# Patient Record
Sex: Male | Born: 1940 | Race: White | Hispanic: No | Marital: Married | Smoking: Former smoker
Health system: Southern US, Community
[De-identification: ages and names within clinical notes are randomized; demographics above are authoritative.]

## PROBLEM LIST (undated history)

## (undated) DIAGNOSIS — R001 Bradycardia, unspecified: Secondary | ICD-10-CM

## (undated) DIAGNOSIS — M549 Dorsalgia, unspecified: Secondary | ICD-10-CM

## (undated) DIAGNOSIS — M8580 Other specified disorders of bone density and structure, unspecified site: Secondary | ICD-10-CM

## (undated) DIAGNOSIS — R030 Elevated blood-pressure reading, without diagnosis of hypertension: Secondary | ICD-10-CM

## (undated) DIAGNOSIS — M1990 Unspecified osteoarthritis, unspecified site: Secondary | ICD-10-CM

## (undated) DIAGNOSIS — E785 Hyperlipidemia, unspecified: Secondary | ICD-10-CM

## (undated) DIAGNOSIS — G8929 Other chronic pain: Secondary | ICD-10-CM

## (undated) DIAGNOSIS — E119 Type 2 diabetes mellitus without complications: Secondary | ICD-10-CM

## (undated) DIAGNOSIS — I10 Essential (primary) hypertension: Secondary | ICD-10-CM

## (undated) DIAGNOSIS — I4891 Unspecified atrial fibrillation: Secondary | ICD-10-CM

## (undated) DIAGNOSIS — I2510 Atherosclerotic heart disease of native coronary artery without angina pectoris: Secondary | ICD-10-CM

## (undated) HISTORY — PX: CHOLECYSTECTOMY: SHX55

## (undated) HISTORY — DX: Hyperlipidemia, unspecified: E78.5

## (undated) HISTORY — DX: Other chronic pain: G89.29

## (undated) HISTORY — DX: Elevated blood-pressure reading, without diagnosis of hypertension: R03.0

## (undated) HISTORY — DX: Other specified disorders of bone density and structure, unspecified site: M85.80

## (undated) HISTORY — DX: Dorsalgia, unspecified: M54.9

## (undated) HISTORY — DX: Atherosclerotic heart disease of native coronary artery without angina pectoris: I25.10

## (undated) HISTORY — DX: Type 2 diabetes mellitus without complications: E11.9

## (undated) HISTORY — DX: Bradycardia, unspecified: R00.1

## (undated) HISTORY — DX: Unspecified atrial fibrillation: I48.91

---
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
## 1999-01-18 ENCOUNTER — Ambulatory Visit (HOSPITAL_COMMUNITY): Admission: RE | Admit: 1999-01-18 | Discharge: 1999-01-19 | Payer: Self-pay | Admitting: Cardiology

## 2001-05-27 ENCOUNTER — Emergency Department (HOSPITAL_COMMUNITY): Admission: EM | Admit: 2001-05-27 | Discharge: 2001-05-27 | Payer: Self-pay | Admitting: Emergency Medicine

## 2001-05-27 ENCOUNTER — Encounter: Payer: Self-pay | Admitting: Emergency Medicine

## 2003-03-11 ENCOUNTER — Emergency Department (HOSPITAL_COMMUNITY): Admission: EM | Admit: 2003-03-11 | Discharge: 2003-03-11 | Payer: Self-pay | Admitting: *Deleted

## 2003-03-11 ENCOUNTER — Encounter: Payer: Self-pay | Admitting: *Deleted

## 2004-07-27 ENCOUNTER — Ambulatory Visit (HOSPITAL_COMMUNITY): Admission: RE | Admit: 2004-07-27 | Discharge: 2004-07-27 | Payer: Self-pay | Admitting: Family Medicine

## 2004-09-25 DIAGNOSIS — I4891 Unspecified atrial fibrillation: Secondary | ICD-10-CM

## 2004-09-25 HISTORY — DX: Unspecified atrial fibrillation: I48.91

## 2004-12-10 ENCOUNTER — Ambulatory Visit: Payer: Self-pay | Admitting: Cardiology

## 2004-12-10 ENCOUNTER — Inpatient Hospital Stay (HOSPITAL_COMMUNITY): Admission: EM | Admit: 2004-12-10 | Discharge: 2004-12-13 | Payer: Self-pay | Admitting: Emergency Medicine

## 2004-12-12 ENCOUNTER — Ambulatory Visit: Payer: Self-pay | Admitting: Cardiology

## 2004-12-13 ENCOUNTER — Ambulatory Visit: Payer: Self-pay | Admitting: Cardiology

## 2005-01-13 ENCOUNTER — Ambulatory Visit: Payer: Self-pay | Admitting: Cardiovascular Disease

## 2005-05-16 ENCOUNTER — Ambulatory Visit: Payer: Self-pay | Admitting: Cardiology

## 2005-06-02 ENCOUNTER — Emergency Department (HOSPITAL_COMMUNITY): Admission: EM | Admit: 2005-06-02 | Discharge: 2005-06-02 | Payer: Self-pay | Admitting: *Deleted

## 2005-06-26 ENCOUNTER — Ambulatory Visit: Payer: Self-pay | Admitting: Cardiology

## 2005-07-05 ENCOUNTER — Ambulatory Visit: Payer: Self-pay | Admitting: Cardiology

## 2005-07-05 ENCOUNTER — Encounter (HOSPITAL_COMMUNITY): Admission: RE | Admit: 2005-07-05 | Discharge: 2005-08-04 | Payer: Self-pay | Admitting: Cardiology

## 2005-07-13 ENCOUNTER — Ambulatory Visit: Payer: Self-pay | Admitting: Cardiology

## 2005-12-27 ENCOUNTER — Ambulatory Visit (HOSPITAL_COMMUNITY): Admission: RE | Admit: 2005-12-27 | Discharge: 2005-12-27 | Payer: Self-pay | Admitting: Family Medicine

## 2007-02-15 ENCOUNTER — Ambulatory Visit (HOSPITAL_COMMUNITY): Admission: RE | Admit: 2007-02-15 | Discharge: 2007-02-15 | Payer: Self-pay | Admitting: Family Medicine

## 2007-05-05 ENCOUNTER — Emergency Department (HOSPITAL_COMMUNITY): Admission: EM | Admit: 2007-05-05 | Discharge: 2007-05-05 | Payer: Self-pay | Admitting: Emergency Medicine

## 2007-05-13 IMAGING — CR DG CHEST 1V PORT
1 series · 1 of 1 positions shown · non-contrast
Comparison: none

CLINICAL DATA: Chest pain.   Cardiac arrhythmia.
 PORTABLE CHEST - 1 VIEW - 06/02/05 AT 5159 HOURS:

[view not recorded]
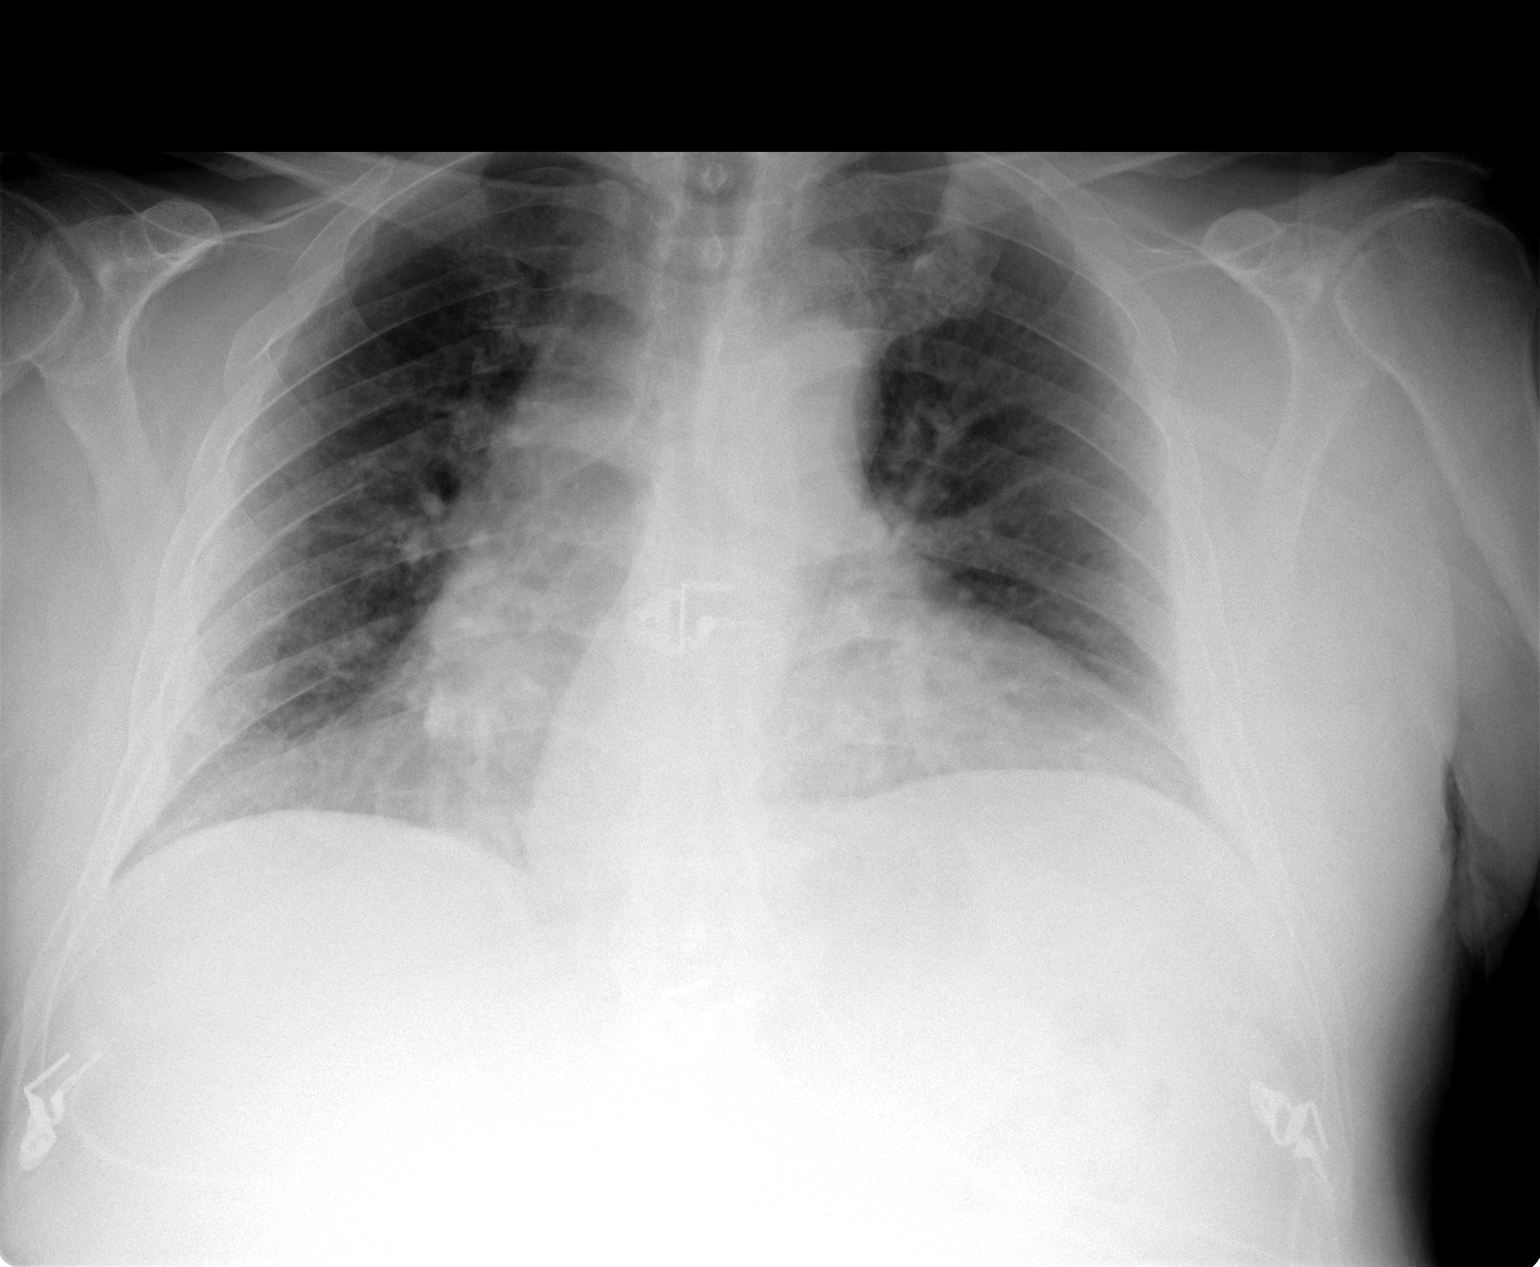

[1 of 1 positions shown; findings below may reference images not displayed]

FINDINGS: Compared to a chest x-ray of 12/10/04, there is little change in cardiomegaly.  The patient is somewhat rotated and has not taken a deep inspiration.  No acute process is seen.
IMPRESSION: Cardiomegaly.  Suboptimal inspiration.  No active process.

## 2008-04-03 ENCOUNTER — Ambulatory Visit (HOSPITAL_COMMUNITY): Admission: RE | Admit: 2008-04-03 | Discharge: 2008-04-03 | Payer: Self-pay | Admitting: Family Medicine

## 2008-09-25 HISTORY — PX: CIRCUMCISION: SUR203

## 2008-12-19 ENCOUNTER — Emergency Department (HOSPITAL_COMMUNITY): Admission: EM | Admit: 2008-12-19 | Discharge: 2008-12-19 | Payer: Self-pay | Admitting: Emergency Medicine

## 2008-12-29 ENCOUNTER — Ambulatory Visit (HOSPITAL_COMMUNITY): Admission: RE | Admit: 2008-12-29 | Discharge: 2008-12-29 | Payer: Self-pay | Admitting: Urology

## 2008-12-29 ENCOUNTER — Encounter (INDEPENDENT_AMBULATORY_CARE_PROVIDER_SITE_OTHER): Payer: Self-pay | Admitting: Urology

## 2010-02-09 ENCOUNTER — Ambulatory Visit (HOSPITAL_COMMUNITY): Admission: RE | Admit: 2010-02-09 | Discharge: 2010-02-09 | Payer: Self-pay | Admitting: Family Medicine

## 2010-04-12 ENCOUNTER — Emergency Department (HOSPITAL_COMMUNITY): Admission: EM | Admit: 2010-04-12 | Discharge: 2010-04-12 | Payer: Self-pay | Admitting: Emergency Medicine

## 2010-09-25 HISTORY — PX: CATARACT EXTRACTION: SUR2

## 2010-12-10 LAB — GLUCOSE, CAPILLARY: Glucose-Capillary: 250 mg/dL — ABNORMAL HIGH (ref 70–99)

## 2010-12-12 ENCOUNTER — Other Ambulatory Visit: Payer: Self-pay | Admitting: Ophthalmology

## 2010-12-12 ENCOUNTER — Encounter (HOSPITAL_COMMUNITY): Payer: Medicare Other | Attending: Ophthalmology

## 2010-12-12 DIAGNOSIS — Z01810 Encounter for preprocedural cardiovascular examination: Secondary | ICD-10-CM | POA: Insufficient documentation

## 2010-12-12 DIAGNOSIS — Z01812 Encounter for preprocedural laboratory examination: Secondary | ICD-10-CM | POA: Insufficient documentation

## 2010-12-12 LAB — HEMOGLOBIN AND HEMATOCRIT, BLOOD: HCT: 46 % (ref 39.0–52.0)

## 2010-12-12 LAB — BASIC METABOLIC PANEL
BUN: 11 mg/dL (ref 6–23)
Creatinine, Ser: 0.82 mg/dL (ref 0.4–1.5)
GFR calc Af Amer: >60 mL/min (ref >60)
GFR calc non Af Amer: >60 mL/min (ref >60)
Glucose, Bld: 194 mg/dL — ABNORMAL HIGH (ref 70–99)
Sodium: 138 mEq/L (ref 135–145)

## 2010-12-26 ENCOUNTER — Encounter (HOSPITAL_COMMUNITY): Payer: Medicare Other | Attending: Ophthalmology

## 2010-12-29 ENCOUNTER — Ambulatory Visit (HOSPITAL_COMMUNITY)
Admission: RE | Admit: 2010-12-29 | Discharge: 2010-12-29 | Disposition: A | Discharge status: Home / Self Care | Payer: Medicare Other | Source: Ambulatory Visit | Attending: Ophthalmology | Admitting: Ophthalmology

## 2010-12-29 DIAGNOSIS — H2510 Age-related nuclear cataract, unspecified eye: Secondary | ICD-10-CM | POA: Insufficient documentation

## 2010-12-29 DIAGNOSIS — I10 Essential (primary) hypertension: Secondary | ICD-10-CM | POA: Insufficient documentation

## 2010-12-29 DIAGNOSIS — Z7982 Long term (current) use of aspirin: Secondary | ICD-10-CM | POA: Insufficient documentation

## 2010-12-29 DIAGNOSIS — E119 Type 2 diabetes mellitus without complications: Secondary | ICD-10-CM | POA: Insufficient documentation

## 2010-12-29 DIAGNOSIS — Z79899 Other long term (current) drug therapy: Secondary | ICD-10-CM | POA: Insufficient documentation

## 2011-01-04 LAB — BASIC METABOLIC PANEL
BUN: 16 mg/dL (ref 6–23)
CO2: 25 mEq/L (ref 19–32)
Creatinine, Ser: 0.8 mg/dL (ref 0.4–1.5)
GFR calc non Af Amer: >60 mL/min (ref >60)
Glucose, Bld: 108 mg/dL — ABNORMAL HIGH (ref 70–99)

## 2011-01-04 LAB — GLUCOSE, CAPILLARY: Glucose-Capillary: 134 mg/dL — ABNORMAL HIGH (ref 70–99)

## 2011-01-04 LAB — HEMOGLOBIN AND HEMATOCRIT, BLOOD: Hemoglobin: 15.3 g/dL (ref 13.0–17.0)

## 2011-01-05 LAB — URINALYSIS, ROUTINE W REFLEX MICROSCOPIC
Bilirubin Urine: NEGATIVE
Ketones, ur: NEGATIVE mg/dL
Nitrite: POSITIVE — AB
Protein, ur: NEGATIVE mg/dL
Urobilinogen, UA: 0.2 mg/dL (ref 0.0–1.0)

## 2011-01-05 LAB — URINE MICROSCOPIC-ADD ON

## 2011-01-16 ENCOUNTER — Encounter (HOSPITAL_COMMUNITY): Payer: Medicare Other | Attending: Ophthalmology

## 2011-01-23 ENCOUNTER — Ambulatory Visit (HOSPITAL_COMMUNITY)
Admission: RE | Admit: 2011-01-23 | Discharge: 2011-01-23 | Disposition: A | Discharge status: Home / Self Care | Payer: Medicare Other | Source: Ambulatory Visit | Attending: Ophthalmology | Admitting: Ophthalmology

## 2011-01-23 DIAGNOSIS — H2510 Age-related nuclear cataract, unspecified eye: Secondary | ICD-10-CM | POA: Insufficient documentation

## 2011-01-23 DIAGNOSIS — Z7982 Long term (current) use of aspirin: Secondary | ICD-10-CM | POA: Insufficient documentation

## 2011-01-23 DIAGNOSIS — I10 Essential (primary) hypertension: Secondary | ICD-10-CM | POA: Insufficient documentation

## 2011-01-23 DIAGNOSIS — Z79899 Other long term (current) drug therapy: Secondary | ICD-10-CM | POA: Insufficient documentation

## 2011-01-23 DIAGNOSIS — E119 Type 2 diabetes mellitus without complications: Secondary | ICD-10-CM | POA: Insufficient documentation

## 2011-01-23 LAB — GLUCOSE, CAPILLARY: Glucose-Capillary: 173 mg/dL — ABNORMAL HIGH (ref 70–99)

## 2011-01-27 ENCOUNTER — Ambulatory Visit (HOSPITAL_COMMUNITY)
Admission: RE | Admit: 2011-01-27 | Discharge: 2011-01-27 | Disposition: A | Discharge status: Home / Self Care | Payer: Medicare Other | Source: Ambulatory Visit | Attending: Family Medicine | Admitting: Family Medicine

## 2011-01-27 ENCOUNTER — Other Ambulatory Visit (HOSPITAL_COMMUNITY): Payer: Self-pay | Admitting: Family Medicine

## 2011-01-27 DIAGNOSIS — M549 Dorsalgia, unspecified: Secondary | ICD-10-CM

## 2011-01-27 DIAGNOSIS — IMO0002 Reserved for concepts with insufficient information to code with codable children: Secondary | ICD-10-CM | POA: Insufficient documentation

## 2011-01-27 DIAGNOSIS — I4891 Unspecified atrial fibrillation: Secondary | ICD-10-CM | POA: Insufficient documentation

## 2011-01-27 DIAGNOSIS — M546 Pain in thoracic spine: Secondary | ICD-10-CM | POA: Insufficient documentation

## 2011-01-27 DIAGNOSIS — R079 Chest pain, unspecified: Secondary | ICD-10-CM | POA: Insufficient documentation

## 2011-02-07 NOTE — Op Note (Signed)
NAME:  Shawn Lopez, Shawn Lopez NO.:  [id number]   MEDICAL RECORD NO.:  [id number]          PATIENT TYPE:  AMB   LOCATION:  DAY                           FACILITY:  [facility]   PHYSICIAN:  Ky Barban, M.D.DATE OF BIRTH:  01-Oct-1940   DATE OF PROCEDURE:  12/29/2008  DATE OF DISCHARGE:                               OPERATIVE REPORT   PREOPERATIVE DIAGNOSES:  Severe phimosis and meatal stenosis.   PROCEDURES:  Circumcision, complex meatotomy, cystoscopy, and dilation.   ANESTHESIA:  Spinal.   DESCRIPTION OF PROCEDURE:  The patient under spinal anesthesia in supine  position, usual prep and drape.  On examination I can see his priapus  is completely circumferentially adherent to his glans penis covering it  completely.  It was attached to the meatus.  First I marked the corona  circumferentially then I marked the foreskin in the midline from the  meatus up to the corona.  A sharp incision was made with the knife down  to the glans penis, which was identified and the priapus is completely  adherent to the glans penis with blunt and sharp dissection it was  lifted off the glans penis completely circumferentially.  Then the  redundant priapus as the previously marked site circumferentially was  excised leaving some areas, I left a mucosa other area was bare.  With  the bleeders I coagulated skin and the mucosa and some of the area was  closed with interrupted sutures of 3-0 chromic and some areas skin was  directly sutured to the back of the corona.  The meatotomy was done and  I had to dilate the urethra to get the flexible cystoscope into the  urethra.  Anterior urethra looks normal.  Prostatic urethra is open.  Bladder is 2+ trabeculated.  No tumor stone or foreign body.  Cystoscope  was removed.  Foley catheter was left in for drainage.  Sterile gauze  dressing applied circumferentially on the circumcision incision and then  the glans penis was covered with some  Neosporin ointment.  The patient  left the operating room in satisfactory condition.      Ky Barban, M.D.  Electronically Signed     MIJ/MEDQ  D:  12/29/2008  T:  12/30/2008  Job:  [id number]

## 2011-02-07 NOTE — H&P (Signed)
NAME:  Shawn Lopez, Shawn Lopez NO.:  [id number]   MEDICAL RECORD NO.:  [id number]          PATIENT TYPE:  AMB   LOCATION:  DAY                           FACILITY:  [facility]   PHYSICIAN:  Ky Barban, M.D.DATE OF BIRTH:  August 09, 1941   DATE OF ADMISSION:  DATE OF DISCHARGE:  LH                              HISTORY & PHYSICAL   CHIEF COMPLAINT:  Severe phimosis.   HISTORY:  A 70 year old gentleman came to the emergency room last week  with difficulty to void and he was found to have meatal stenosis and  severe phimosis.  His glans penis was completely covered with his  prepuce and his meatus was scarred.  I was able to dilate his meatus a  little bit and I recommended that he needed to have meatotomy and  circumcision and cystoscopy for which he is coming as an outpatient and  I have described the procedure and limitations and complications.  I  have told him that meatus scarring can keep coming back and we will have  to keep dilating it.   PAST MEDICAL HISTORY:  He does have diabetes, noninsulin dependent.  No  history of hypertension.   MEDICATIONS:  None.   REVIEW OF SYSTEMS:  Unremarkable.   PAST SURGICAL HISTORY:  He had appendectomy done in 1960.   PERSONAL HISTORY:  He does not smoke or drink.   PHYSICAL EXAMINATION:  Blood pressure 142/72.  Fully conscious, alert and oriented, not in acute distress.  CENTRAL NERVOUS SYSTEM:  Negative.  HEAD AND NECK:  Negative.  CHEST:  Symmetrical.  HEART:  Regular sinus rhythm.  No murmur.  ABDOMEN:  Soft, flat.  Liver, spleen, and kidneys are not palpable.  No CVA tenderness.  EXTERNAL GENITALIA:  On looking at his penis, the glans penis completely  covered and I cannot see the glans penis.  The meatus is scarred and  narrow.  Testicles are normal.  RECTAL:  Sphincter tone is normal.  No rectal mass.  Prostate one and a  half plus, smooth and firm.   IMPRESSION:  Meatal stenosis and phimosis.   PLAN:   Circumcision, meatotomy, and cystoscopy under anesthesia as an  outpatient.      Ky Barban, M.D.  Electronically Signed     MIJ/MEDQ  D:  12/28/2008  T:  12/28/2008  Job:  [id number]

## 2011-02-10 NOTE — H&P (Signed)
NAME:  LEOPOLD, SMYERS NO.:  [id number]   MEDICAL RECORD NO.:  [id number]          PATIENT TYPE:  INP   LOCATION:  IC03                          FACILITY:  [facility]   PHYSICIAN:  Kingsley Callander. Ouida Sills, MD       DATE OF BIRTH:  [birth year]   DATE OF ADMISSION:  12/10/2004  DATE OF DISCHARGE:  LH                                HISTORY & PHYSICAL   CHIEF COMPLAINT:  Dizziness.   HISTORY OF PRESENT ILLNESS:  This patient is a 70 year old white male who  presented to the emergency room after feeling lightheaded and dizzy.  He  felt as though he would pass out.  He did not lose consciousness.  He  experienced tightness in his chest for about 1-1/2 hours.  He had some  associated shortness of breath along with diaphoresis.  He did not vomit.  He has a history of a normal cardiac catheterization he states approximately  5 years ago by the [facility].  He describes having a  similar episode with rapid heart beating while on an elk hunt in [place]  last Fall.  He required helicopter transport to the hospital in [place].  He  had pneumonia at that time and an associated bout of what sounds like atrial  fibrillation.  On presentation to the emergency room today he had rapid  atrial fibrillation and converted after treatment with IV diltiazem.  He  subsequently had pauses on diltiazem and his rate was reduced to 5 mg per  hour.  He does not smoke.  He does not have diabetes.  He has been treated  for hypertension in the past but has not required therapy recently.  He has  a family history of coronary heart disease in that two brothers and his  father had MIs.   PAST MEDICAL HISTORY:  1.  Appendectomy.  2.  Two back fractures.  3.  Pneumonia.  4.  Hypertension.   MEDICATIONS:  None.   ALLERGIES:  None.   SOCIAL HISTORY:  He does not smoke, drink or use drugs.   FAMILY HISTORY:  As above.   REVIEW OF SYSTEMS:  No thyroid disease, cough, abdominal pain, change in  bowel habits, rectal bleeding, difficulty voiding, hematuria.   PHYSICAL EXAMINATION:  VITAL SIGNS:  Temperature 97.4, blood pressure  141/77, pulse initially in the 170 range, respirations 20.  GENERAL:  Alert, fully oriented.  He is overweight.  HEENT:  Eyes/oropharynx appear normal.  No JVD, thyromegaly or bruit.  LUNGS:  Clear.  HEART:  Regular with no murmurs.  ABDOMEN:  Nontender.  No hepatosplenomegaly.  EXTREMITIES:  Normal pedal pulses.  No cyanosis, clubbing or edema.  NEUROLOGIC:  Grossly intact.   LABORATORY DATA:  His initial rhythm strip reveals atrial fibrillation with  a rapid ventricular response.  His 12-lead EKG reveals normal sinus rhythm  at 92 beats per minute following conversion.  There are no ischemic changes.  White count 9.3, hemoglobin 16.4, platelets 288, sodium 136, potassium 3.5,  bicarb 24, glucose 122, BUN 13, creatinine 1, calcium 9.2, albumin 3.8, SGOT  25, troponin I  less than 0.05, CK-MB 2.  His chest x-ray reveals mild  cardiomegaly but no active disease.   IMPRESSION:  1.  Recurrent onset atrial fibrillation with a rapid ventricular response.      He will be hospitalized in the CCU.  Serial cardiac enzymes and      electrocardiograms will be obtained.  A TSH and echocardiogram will be      checked.  We will continue intravenous diltiazem however, his rate will      need to be watched closely in light of his pauses earlier in the      emergency room.  His oxygen saturation is normal at 97%.  He is free of      any chest tightness now.  2.  History of hypertension.      ROF/MEDQ  D:  12/10/2004  T:  12/10/2004  Job:  [id number]   cc:   Mila Homer. Sudie Bailey, M.D.  [address] [place], [place] [address]  Fax: [phone]

## 2011-02-10 NOTE — Group Therapy Note (Signed)
NAME:  Shawn Lopez, AZEEZ NO.:  [id number]   MEDICAL RECORD NO.:  [id number]          PATIENT TYPE:  INP   LOCATION:  A218                          FACILITY:  [facility]   PHYSICIAN:  Mila Homer. Sudie Bailey, M.D.DATE OF BIRTH:  10/22/1940   DATE OF PROCEDURE:  12/12/2004  DATE OF DISCHARGE:                                   PROGRESS NOTE   SUBJECTIVE:  Patient had a bout of atrial fibrillation, rapid ventricular  response two days ago.  He was brought to the hospital, treated with  diltiazem and his rate eventually reverted to normal.  He now feels totally  back to normal, has no symptoms.   OBJECTIVE:  Temperature 97.2, pulse 53, respirations 18, blood pressure  119/64.  He is sitting up in a chair. He is well-developed, well-nourished,  no acute distress.  He is oriented and alert.  His color is good.  The heart  has a regular rate and rhythm about 65, no obvious murmurs.  The lungs  appear clear throughout.  Abdomen is soft without hepatosplenomegaly or  mass.  He has mild obesity.  No edema of the ankles.   ASSESSMENT:  Paroxysmal atrial fibrillation.   PLAN:  Continue current medications of aspirin 325 mg daily, metoprolol 25  mg b.i.d., Kay-Ciel 20 mEq b.i.d.  Discuss with Dr. Dietrich Pates, Cardiology.  Once planned how to treat him outpatient and prevent further paroxysmal  atrial fibrillation will discharge him.      SDK/MEDQ  D:  12/12/2004  T:  12/12/2004  Job:  [id number]

## 2011-02-10 NOTE — Procedures (Signed)
NAME:  Shawn Lopez, Shawn Lopez NO.:  [id number]   MEDICAL RECORD NO.:  [id number]          PATIENT TYPE:  REC   LOCATION:  ED                            FACILITY:  [facility]   PHYSICIAN:  Willa Rough, M.D.     DATE OF BIRTH:  1941-05-09   DATE OF PROCEDURE:  DATE OF DISCHARGE:  06/02/2005                                    STRESS TEST   HISTORY OF PRESENT ILLNESS:  The patient has no known coronary disease and  has had chest pain.  This study is done for further evaluation.   STRESS PROTOCOL:  The patient walked on the treadmill.  He walked for 10  minutes with a peak heart rate of 133 representing 85% of predicted maximum  heart rate.  There was some slight chest discomfort and some shortness of  breath.  There was no significant EKG change.  There was no significant  arrhythmia.   NUCLEAR IMAGES:  The raw data reveals that there is some motion in the  stress image.  The motion correction protocol was used.  The tomograph  images reveal no evidence of significant interference from other nuclear  activity.  There are no areas of decreased counts.  There is no sign of scar  or ischemia.  Wall motion is normal, and the ejection fraction is 61%.   IMPRESSION:  Walking on the treadmill, there is no diagnostic EKG change.  There was slight chest tightness.  The stress images are normal.  There is  no sign of scar or ischemia.  There is normal wall motion, and a normal  ejection fraction.           ______________________________  Willa Rough, M.D.     JK/MEDQ  D:  07/05/2005  T:  07/06/2005  Job:  [id number]   cc:   Vayas Bing, M.D. [facility]  [address]  [address]  [place]  [place] [address]

## 2011-02-10 NOTE — Consult Note (Signed)
NAME:  Shawn Lopez, Shawn Lopez NO.:  [id number]   MEDICAL RECORD NO.:  [id number]          PATIENT TYPE:  INP   LOCATION:  A218                          FACILITY:  [facility]   PHYSICIAN:  [place] Bing, M.D.  DATE OF BIRTH:  [birth year]   DATE OF CONSULTATION:  12/12/2004  DATE OF DISCHARGE:                                   CONSULTATION   REFERRING PHYSICIAN:  Dr. Metta Clines.   HISTORY OF PRESENT ILLNESS:  A 70 year old gentleman with generally  excellent health, admitted to [facility] with recurrent paroxysmal  atrial fibrillation. [name] first developed palpitations and  lightheadedness on a hunting trip in [place] some months ago. He required  helicopter evacuation to a local hospital, but was kept only overnight and  apparently converted to sinus rhythm spontaneously. He underwent a stress  test that was reportedly negative. Subsequent to that, he was well until the  past week or so when he experienced a brief episode of recurrent symptoms.  On the day of admission, he developed a prolonged episode associated with  lightheadedness, near syncope, chest tightness, dyspnea and diaphoresis,  lasting for approximately 90 minutes. He was found to be in atrial  fibrillation with a rapid ventricular response on arrival to the emergency  department, no 12-lead EKG documents that rhythm. He was treated with  intravenous Diltiazem and subsequently converted to sinus rhythm with  resolution of symptoms. He experienced an episode of lightheadedness and  weakness earlier today, but no arrhythmia was apparently identified on  telemetry.   PAST MEDICAL HISTORY:  Notable for borderline hypertension, which has not  required pharmacologic therapy. He has a history of back injury, apparently  with vertebral fracture. He was treated for pneumonia in October of last  year. Prior surgeries have included appendectomy and cholecystectomy. He was  admitted to [facility] 5  years ago with chest discomfort. Coronary  angiography was reportedly normal at that time.   The patient has no known allergies. He is currently taking no medications  routinely.   SOCIAL HISTORY:  Lives in [place] with his wife; has 5 healthy children.  Works as a Armed forces technical officer. Very active, including  multiple outdoor activities.   FAMILY HISTORY:  Mother died at age 51 due to Alzheimer's. Father died at  age 19 with a remote history of myocardial infarction. His fatal event was  neoplastic disease. He has 2 brothers, both of whom died of coronary artery  disease in their 55s.   REVIEW OF SYSTEMS:  Notable for intermittent headaches, occasionally with  dizziness. He has had intermittent dyspnea on exertion. He occasionally  experiences weakness in his right arm. He has myalgias of his back. He  reports occasional nausea. All other systems are reviewed and are negative.   On exam, pleasant, tanned, well-appearing gentleman. The temperature is  97.2, heart rate 55 and regular, respirations 18, blood pressure 120/65,  weight 213.  HEENT:  Anicteric sclerae; normal lens and conjunctivae.  NECK:  No jugular vein distention; normal carotid upstrokes without bruits.  HEMATOPOIETIC:  No lymphadenopathy.  ENDOCRINE:  No thyromegaly.  SKIN:  No significant lesions.  LUNGS:  Clear.  CARDIAC:  Normal first and second heart sounds; fourth heart sound present.  ABDOMEN:  Soft and nontender; no organomegaly.  EXTREMITIES:  Normal distal pulses; no edema.  NEUROMUSCULAR:  Normal strength and tone; normal cranial nerves.  MUSCULOSKELETAL:  No joint deformity.   CHEST X-RAY:  Slight cardiomegaly; otherwise unremarkable.  EKG:  Sinus bradycardia; otherwise normal.   Other laboratories notable for normal CBC, normal chemistry profile and  normal cardiac markers and normal TSH.   IMPRESSION:  [name] presents with recurrent paroxysmal atrial  fibrillation,  apparently without any significant structural heart disease.  His history of hypertension is marginal. Would consider this paroxysmal  etiopathic atrial fibrillation. He has no real risk factors for  thromboembolism. Accordingly, carotid anticoagulation will not be required.  Beta blockers inappropriate first drug. He is only tolerating a low dose of  this due to sinus bradycardia and probably has a component of sick sinus  syndrome. Continue treatment with aspirin is also appropriate. Digoxin can  be added if he has recurrent atrial fibrillation with an excessive  ventricular response.  Potential future and alternative treatments were discussed with the patient  including anti arrhythmic therapy and radiofrequency ablation. For now, no  further evaluation or treatment is required other than his echocardiogram,  which is pending. We will arrange event recording for the next month since  he continues to have symptoms apparently in the absence of arrhythmia and  will reevaluate this nice gentleman in the office following that testing.      RR/MEDQ  D:  12/13/2004  T:  12/13/2004  Job:  [id number]

## 2011-02-10 NOTE — Procedures (Signed)
NAME:  Shawn Lopez, Shawn Lopez NO.:  [id number]   MEDICAL RECORD NO.:  [id number]          PATIENT TYPE:  OUT   LOCATION:  RAD                           FACILITY:  [facility]   PHYSICIAN:  Edward L. Juanetta Gosling, M.D.DATE OF BIRTH:  May 01, 1941   DATE OF PROCEDURE:  DATE OF DISCHARGE:  07/27/2004                              PULMONARY FUNCTION TEST   1.  Spirometry is normal with the exception of a reduction of [id number] which      may indicate small airway airflow obstruction.  2.  Lung volumes are normal.  3.  Diffusion capacity of carbon monoxide is normal.     [place]   ELH/MEDQ  D:  08/01/2004  T:  08/01/2004  Job:  [id number]   cc:   Mila Homer. Sudie Bailey, M.D.  [address] [place], [place] [address]  Fax: [phone]

## 2011-02-10 NOTE — Procedures (Signed)
NAME:  Shawn Lopez, Shawn Lopez NO.:  [id number]   MEDICAL RECORD NO.:  [id number]          PATIENT TYPE:  INP   LOCATION:  A218                          FACILITY:  [facility]   PHYSICIAN:  Nome Bing, M.D.  DATE OF BIRTH:  Apr 25, 1941   DATE OF PROCEDURE:  12/12/2004  DATE OF DISCHARGE:                                  ECHOCARDIOGRAM   CLINICAL DATA:  70 year old gentleman with paroxysmal atrial fibrillation.   M-MODE:  Aorta  3.5  Left atrium  4.5  Septum  1.6  Posterior wall  1.3  LV diastole  3.2  LV systole  1.9   1.  Technically adequate echocardiographic study.  2.  Mild left atrial enlargement; normal right atrium and right ventricle.  3.  Mild aortic valvular sclerosis.  4.  Normal mitral valve; mild annular calcification.  5.  Normal tricuspid valve.  6.  Normal left ventricular size; mild hypertrophy with disproportionate      thickening of the septum; normal regional and global LV systolic      function.      RR/MEDQ  D:  12/12/2004  T:  12/13/2004  Job:  [id number]

## 2011-02-10 NOTE — Discharge Summary (Signed)
NAME:  Shawn Lopez, Shawn Lopez NO.:  [id number]   MEDICAL RECORD NO.:  [id number]          PATIENT TYPE:  INP   LOCATION:  A218                          FACILITY:  [facility]   PHYSICIAN:  Mila Homer. Sudie Bailey, M.D.DATE OF BIRTH:  06/20/1941   DATE OF ADMISSION:  12/10/2004  DATE OF DISCHARGE:  LH                                 DISCHARGE SUMMARY   A 70 year old was admitted to the hospital with atrial fibrillation and  rapid ventricular response.  He had a benign four-day hospitalization  extending from March 18 to December 13, 2004.  Once his rate was controlled and  converted, his vital signs were stable.   His admission CBC, CNP were normal except for glucose 122 and his cardiac  markers were negative.  Although by his second day, his troponin was up to  0.06 (CK was only 116, MB 1.8, relative index 1.6), however.  His PSA was  2.708.   Chest x-ray showed mild cardiomegaly.  No evidence for active chest  disease.   He had a cardiac echo the day before discharge, which showed mild  hypertrophy of the left ventricle with disproportionate thickening of the  septum with normal reasonable global LV systolic function.   His EKG on admission showed atrial fibrillation and rapid ventricular  response.  He was then treated with a bolus of diltiazem and put on a  diltiazem drip.  On this, he eventually converted, but had several  significant pauses lasting about 3 seconds a piece.  Eventually converted,  was a normal sinus rhythm.  And then on metoprolol became somewhat  bradycardic such that repeat EKG, his second day, March 19, showed a rate of  56.   Treatment in the hospital consisted of aspirin 325 mg daily, Lovenox mg/kg  subcutaneous q.12h., O2 at 2 liters by nasal prongs, and p.r.n. Tylenol and  Ambien.  He was given KCl 20 mEq t.i.d. for three doses.   His second day Lovenox was discontinued and he was moved from the intensive  care unit to the floor in a monitored bed.   He was started on Lopressor 25  mg b.i.d. and a consult was put in with [facility].   He was seen by Dr. Donnamarie Rossetti and felt a sick sinus syndrome.  Metoprolol  was continued and arrangements were made for him to have an event recorder  outpatient.   He was continued on metoprolol 25 mg b.i.d. (60 with 11 refills).  He is to  use 81 mg aspirin daily.  Followup in our office within the week.   FINAL DISCHARGE DIAGNOSES:  1.  Atrial fibrillation with rapid ventricular response.  2.  Presumptive sick sinus syndrome.  3.  Hyperglycemia.   Time spent with the patient today reviewing his paper medical record,  electronic medical record, writing prescriptions, talking to the patient,  examining the patient, discussing with cardiology, dictating a note was 35  minutes.      SDK/MEDQ  D:  12/13/2004  T:  12/13/2004  Job:  [id number]

## 2011-02-15 ENCOUNTER — Other Ambulatory Visit (HOSPITAL_COMMUNITY): Payer: Self-pay | Admitting: Family Medicine

## 2011-02-15 DIAGNOSIS — Z139 Encounter for screening, unspecified: Secondary | ICD-10-CM

## 2011-02-24 ENCOUNTER — Ambulatory Visit (HOSPITAL_COMMUNITY)
Admission: RE | Admit: 2011-02-24 | Discharge: 2011-02-24 | Disposition: A | Discharge status: Home / Self Care | Payer: Medicare Other | Source: Ambulatory Visit | Attending: Family Medicine | Admitting: Family Medicine

## 2011-02-24 DIAGNOSIS — Z139 Encounter for screening, unspecified: Secondary | ICD-10-CM

## 2011-02-24 DIAGNOSIS — M949 Disorder of cartilage, unspecified: Secondary | ICD-10-CM | POA: Insufficient documentation

## 2011-02-24 DIAGNOSIS — M899 Disorder of bone, unspecified: Secondary | ICD-10-CM | POA: Insufficient documentation

## 2011-03-06 NOTE — Op Note (Signed)
  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  OPERATION PERFORMED:  Phacoemulsification with posterior chamber intraocular lens implantation, right eye.  SURGEON:  Bonne Dolores. Radley Barto, MD  ANESTHESIA:  General endotracheal anesthesia.  OPERATIVE SUMMARY:  In the preoperative area, dilating drops were placed into the right eye.  The patient was then brought into the operating room where he was placed under general anesthesia.  The eye was then prepped and draped.  Beginning with a 75 blade, a paracentesis port was made at the surgeon's 2 o'clock position.  The anterior chamber was then filled with a 1% nonpreserved lidocaine solution with epinephrine.  This was followed by Viscoat to deepen the chamber.  A small fornix-based peritomy was performed superiorly.  Next, a single iris hook was placed through the limbus superiorly.  A 2.4-mm keratome blade was then used to make a clear corneal incision over the iris hook.  A bent cystotome needle and Utrata forceps were used to create a continuous tear capsulotomy.  Hydrodissection was performed using balanced salt solution on a fine cannula.  The lens nucleus was then removed using phacoemulsification in a quadrant cracking technique.  The cortical material was then removed with irrigation and aspiration.  The capsular bag and anterior chamber were refilled with Provisc.  The wound  was widened to approximately 3 mm and a posterior chamber intraocular lens was placed into the capsular bag without difficulty using an [employer] lens injecting system.  A single 10-0 nylon suture was then used to close the incision as well as stromal hydration.  The Provisc was removed from the anterior chamber and capsular bag with irrigation and aspiration.  At this point, the wounds were tested for leak, which were negative.  The anterior chamber remained deep and stable.  The patient tolerated the procedure well.  There were no operative complications, and he awoke from general anesthesia without problem.  Toric lens was oriented at the 120 and 300 degree meridians.  The prosthetic device used is a Star Toric posterior chamber lens model [id number], power of 23.0 spherical equivalent with a +2.0 Toric add serial number is [id number].          ______________________________ Susanne Greenhouse, MD     KEH/MEDQ  D:  02/02/2011  T:  02/03/2011  Job:  [id number]  Electronically Signed by Nash Dimmer Wrenn Willcox MD on 03/06/2011 12:18:19 PM

## 2011-03-10 ENCOUNTER — Other Ambulatory Visit (HOSPITAL_COMMUNITY): Payer: Self-pay | Admitting: Family Medicine

## 2011-03-10 DIAGNOSIS — M549 Dorsalgia, unspecified: Secondary | ICD-10-CM

## 2011-03-17 ENCOUNTER — Ambulatory Visit (HOSPITAL_COMMUNITY)
Admission: RE | Admit: 2011-03-17 | Discharge: 2011-03-17 | Disposition: A | Discharge status: Home / Self Care | Payer: Medicare Other | Source: Ambulatory Visit | Attending: Family Medicine | Admitting: Family Medicine

## 2011-03-17 DIAGNOSIS — E119 Type 2 diabetes mellitus without complications: Secondary | ICD-10-CM | POA: Insufficient documentation

## 2011-03-17 DIAGNOSIS — R0789 Other chest pain: Secondary | ICD-10-CM | POA: Insufficient documentation

## 2011-03-17 DIAGNOSIS — M549 Dorsalgia, unspecified: Secondary | ICD-10-CM

## 2011-03-17 MED ORDER — IOHEXOL 300 MG/ML  SOLN
80.0000 mL | Freq: Once | INTRAMUSCULAR | Status: AC | PRN
  Administered 2011-03-17: 80 mL via INTRAVENOUS

## 2011-04-20 DIAGNOSIS — I4891 Unspecified atrial fibrillation: Secondary | ICD-10-CM

## 2011-04-20 DIAGNOSIS — R739 Hyperglycemia, unspecified: Secondary | ICD-10-CM

## 2011-04-20 DIAGNOSIS — M549 Dorsalgia, unspecified: Secondary | ICD-10-CM

## 2011-04-20 DIAGNOSIS — M8580 Other specified disorders of bone density and structure, unspecified site: Secondary | ICD-10-CM

## 2011-04-20 DIAGNOSIS — I495 Sick sinus syndrome: Secondary | ICD-10-CM

## 2011-04-20 DIAGNOSIS — E785 Hyperlipidemia, unspecified: Secondary | ICD-10-CM

## 2011-04-21 ENCOUNTER — Encounter: Payer: Self-pay | Admitting: Cardiology

## 2011-04-21 ENCOUNTER — Ambulatory Visit (INDEPENDENT_AMBULATORY_CARE_PROVIDER_SITE_OTHER): Payer: Medicare Other | Admitting: Cardiology

## 2011-04-21 DIAGNOSIS — I10 Essential (primary) hypertension: Secondary | ICD-10-CM | POA: Insufficient documentation

## 2011-04-21 DIAGNOSIS — M8580 Other specified disorders of bone density and structure, unspecified site: Secondary | ICD-10-CM | POA: Insufficient documentation

## 2011-04-21 DIAGNOSIS — G8929 Other chronic pain: Secondary | ICD-10-CM

## 2011-04-21 DIAGNOSIS — M549 Dorsalgia, unspecified: Secondary | ICD-10-CM | POA: Insufficient documentation

## 2011-04-21 DIAGNOSIS — E785 Hyperlipidemia, unspecified: Secondary | ICD-10-CM | POA: Insufficient documentation

## 2011-04-21 DIAGNOSIS — R079 Chest pain, unspecified: Secondary | ICD-10-CM

## 2011-04-21 DIAGNOSIS — R030 Elevated blood-pressure reading, without diagnosis of hypertension: Secondary | ICD-10-CM

## 2011-04-21 DIAGNOSIS — E1169 Type 2 diabetes mellitus with other specified complication: Secondary | ICD-10-CM | POA: Insufficient documentation

## 2011-04-21 DIAGNOSIS — M899 Disorder of bone, unspecified: Secondary | ICD-10-CM

## 2011-04-21 DIAGNOSIS — E119 Type 2 diabetes mellitus without complications: Secondary | ICD-10-CM | POA: Insufficient documentation

## 2011-04-21 DIAGNOSIS — I4891 Unspecified atrial fibrillation: Secondary | ICD-10-CM

## 2011-04-21 NOTE — Assessment & Plan Note (Signed)
Blood pressure control is marginal today and apparently has been suboptimal in the past.  We may wish to add an ACE inhibitor, low-dose diuretic or both to his medical regime.

## 2011-04-21 NOTE — Patient Instructions (Signed)
**Note De-identified Shawn Lopez Obfuscation** Your physician recommends that you continue on your current medications as directed. Please refer to the Current Medication list given to you today.  Your physician has requested that you have an exercise tolerance test. For further information please visit [email or web address]. Please also follow instruction sheet, as given.  Your physician recommends that you schedule a follow-up appointment in: as needed  

## 2011-04-21 NOTE — Assessment & Plan Note (Signed)
It is surprising the patient experienced 2 separate episodes of atrial fibrillation 5 years ago, but has had no clinical recurrence.  Subclinical episodes are certainly possible.  With treated hypertension and diabetes at the age of 52, his risk for thromboembolism would be significant were sustained AF to recur.  We will continue to observe him for this possibility and consider whether a period of event recording would be appropriate.

## 2011-04-21 NOTE — Progress Notes (Addendum)
HPI:  Shawn Lopez is seen in the office today at the kind request of Dr. Sudie Bailey following a long hiatus, for evaluation of chest discomfort and coronary calcification noted on CT scanning of the chest.  This nice gentleman has enjoyed generally excellent health.  He developed 2 episodes of atrial fibrillation in 2006 at which time he had an essentially normal echocardiogram and normal thyroid function.  He has had no documented recurrences although he does note tachycardia and palpitations at times.  Over recent months, he has experienced intermittent episodes of unpredictable left chest discomfort radiating to the left axillary and inner upper arm.  There is no relationship to exertion or any other factor that Shawn Lopez identified.  He has no associated symptoms and generally ignores the discomfort, which is fairly mild.  He remains active without exertional dyspnea, orthopnea, PND, lightheadedness or syncope.  Current Outpatient Prescriptions on File Prior to Visit  Medication Sig Dispense Refill  . metFORMIN (GLUCOPHAGE) 500 MG tablet Take 500 mg by mouth 2 (two) times daily.       . metoprolol tartrate (LOPRESSOR) 25 MG tablet Take 25 mg by mouth 2 (two) times daily.        . pravastatin (PRAVACHOL) 40 MG tablet Take 40 mg by mouth 2 (two) times daily.         No Known Allergies    Past Medical History  Diagnosis Date  . Diabetes mellitus   . Osteopenia     by bone densitometry  . Hyperlipidemia   . Atrial fibrillation 2006    Echocardiogram in 2006-mild LVH with normal EF  . Chronic back pain     Prior trauma with vertebral fracture  . Diabetes mellitus     CBGs 150-170 in 2011; 2112- treated with single oral agent  . Borderline hypertension   . Chest pain 2001    negative stress nuclear in 2006; negative coronary angiography(no documentation); essentially normal PFTs in 07/2004; CT in 2012-lipomatous hypertrophy; mild atherosclerosis of the aorta and coronaries; mild dependent  pulmonary scarring or atelectasis; small area of focal pleural thickening; chronic bronchitic changes on chest x-ray     Past Surgical History  Procedure Date  . Appendectomy 1960  . Circumcision 2010    meatotomy and dilatation  . Cataract extraction 2012    right eye,lens implantation  . Cholecystectomy      No family history on file.   History   Social History  . Marital Status: Married    Spouse Name: N/A    Number of Children: N/A  . Years of Education: N/A   Occupational History  . Not on file.   Social History Main Topics  . Smoking status: Former Smoker -- 1.5 packs/day for 15 years    Types: Cigarettes    Quit date: 04/20/1997  . Smokeless tobacco: Never Used  . Alcohol Use: No  . Drug Use: No  . Sexually Active: Not on file   Other Topics Concern  . Not on file   Social History Narrative  . No narrative on file     ROS: Requires corrective lenses for near vision; prior bilateral cataract surgery with lens implants; upper and lower dentures; few intermittent palpitations; stable weight and appetite; diffuse arthritic discomfort; intermittent pedal edema.  All other systems reviewed and are negative.  PHYSICAL EXAM: BP 147/85  Pulse 58  Ht 5\' 10"  (1.778 m)  Wt 98.884 kg (218 lb)  BMI 31.28 kg/m2  SpO2 96%  General-Well-developed; no  acute distress Body [place]-overweight HEENT-[place]/AT; PERRL; EOM intact; conjunctiva and lids nl Neck-No JVD; no carotid bruits Endocrine-Borderline thyromegaly Lungs-Clear lung fields; resonant percussion; normal I-to-E ratio Cardiovascular- normal PMI; normal S1 and S2; grade 2/6 systolic murmur at the left sternal border and apex; no chest wall tenderness Abdomen-BS normal; soft and non-tender without masses or organomegaly Musculoskeletal-No deformities, cyanosis or clubbing Neurologic-Nl cranial nerves; symmetric strength and tone Skin- Warm, no significant lesions Extremities-Nl distal pulses; no edema  EKG:   Sinus bradycardia at a rate of 57 bpm; modest nondiagnostic inferior Q waves; otherwise normal.  No previous tracing for comparison.  ASSESSMENT AND PLAN:

## 2011-04-21 NOTE — Assessment & Plan Note (Addendum)
Control of hyperlipidemia has been excellent on current medication, which will be continued.

## 2011-04-21 NOTE — Assessment & Plan Note (Signed)
Chest discomfort is quite atypical and extremely unlikely to be of cardiac origin.  Calcification in the coronary vessels is expected in this gentleman with multiple cardiovascular risk factors.  A graded exercise test will be performed to exclude high risk disease.

## 2011-05-05 ENCOUNTER — Ambulatory Visit (INDEPENDENT_AMBULATORY_CARE_PROVIDER_SITE_OTHER): Payer: Medicare Other | Admitting: *Deleted

## 2011-05-05 DIAGNOSIS — R079 Chest pain, unspecified: Secondary | ICD-10-CM

## 2011-05-05 NOTE — Progress Notes (Signed)
Stress Lab Nurses Notes - Shawn Lopez 05/05/2011  Reason for doing test: Chest Pain  Type of test: Regular GTX  Nurse performing test: Parke Poisson, RN  Nuclear Medicine Tech: Not Applicable  Echo Tech: Not Applicable  MD performing test: R. Rothbart  Family MD: Sudie Bailey  Test explained and consent signed: yes  IV started: No IV started  Symptoms: SOB and fatigue in legs  Treatment/Intervention: None  Reason test stopped: fatigue  After recovery IV was: NA  Patient to return to Nuc. Med at : NA  Patient discharged: Home  Patient's Condition upon discharge was: stable  Comments: Symptoms resolved in recovery.  Erskine Speed T

## 2011-05-12 NOTE — Progress Notes (Addendum)
Cardiology Stress Test Report  Patient performed treadmill exercise to a work load of 8.8 METs and a heart rate of 126, 83% of age-predicted maximum.  Exercise discontinued due to fatigue and mild dyspnea; no chest pain reported.  Blood pressure increased from a resting value of 130/75 to160/60 during exercise and 190/80 in recovery, a normal response.  No arrhythmias noted.  EKG: Sinus bradycardia at a rate of 56 bpm; nondiagnostic inferolateral Q waves; otherwise normal. Stress EKG: Significant ST segment depression first noted in the final minute of exercise with resolution during the first minute of recovery.  1.5-2 mm of flat and slightly upsloping ST segment depression noted in the inferior leads as well as 1-1.5 mm in V4 through V6.  Impression: Abnormal graded exercise test revealing fairly good exercise tolerance with the patient achieving an adequate work load and heart rate.  Despite the absence of angina, there was an electrocardiographically positive response, albeit occurring briefly near peak exercise and resolving quickly in recovery.  While this has the characteristics of a false positive EKG response, a stress nuclear study will be performed for further assessment.

## 2011-05-26 ENCOUNTER — Encounter: Payer: Medicare Other | Admitting: *Deleted

## 2011-05-30 ENCOUNTER — Encounter (HOSPITAL_COMMUNITY)
Admission: RE | Admit: 2011-05-30 | Discharge: 2011-05-30 | Disposition: A | Discharge status: Home / Self Care | Payer: Medicare Other | Source: Ambulatory Visit | Attending: Cardiology | Admitting: Cardiology

## 2011-05-30 ENCOUNTER — Ambulatory Visit (INDEPENDENT_AMBULATORY_CARE_PROVIDER_SITE_OTHER): Payer: Medicare Other | Admitting: *Deleted

## 2011-05-30 ENCOUNTER — Encounter (HOSPITAL_COMMUNITY): Payer: Self-pay | Admitting: Cardiology

## 2011-05-30 DIAGNOSIS — I4891 Unspecified atrial fibrillation: Secondary | ICD-10-CM

## 2011-05-30 DIAGNOSIS — R079 Chest pain, unspecified: Secondary | ICD-10-CM

## 2011-05-30 MED ORDER — TECHNETIUM TC 99M TETROFOSMIN IV KIT
10.0000 | PACK | Freq: Once | INTRAVENOUS | Status: AC | PRN
  Administered 2011-05-30: 9.5 via INTRAVENOUS

## 2011-05-30 MED ORDER — TECHNETIUM TC 99M TETROFOSMIN IV KIT
30.0000 | PACK | Freq: Once | INTRAVENOUS | Status: AC | PRN
  Administered 2011-05-30: 30.1 via INTRAVENOUS

## 2011-05-30 NOTE — Progress Notes (Signed)
Stress Lab Nurses Notes - Shawn Lopez 05/30/2011  Reason for doing test: Chest Pain  Type of test: Stress Myoview  Nurse performing test: Marlena Clipper, RN  Nuclear Medicine Tech: Lyndel Pleasure  Echo Tech: Not Applicable  MD performing test: R. Rothbart  Family MD: Sudie Bailey  Test explained and consent signed: yes  IV started: 22g jelco, Saline lock flushed, No redness or edema and Saline lock started in radiology  Symptoms: Sob and fatique   Treatment/Intervention: None  Reason test stopped: reached target HR  After recovery IV was: Discontinued via X-ray tech and No redness or edema  Patient to return to Nuc. Med at :  Patient discharged: Home  Patient's Condition upon discharge was: stable  Comments: Symptoms resolved in recovery. Rest BP110/60 peak BP130/80  [name]

## 2011-05-31 ENCOUNTER — Encounter: Payer: Self-pay | Admitting: *Deleted

## 2011-06-01 ENCOUNTER — Telehealth: Payer: Self-pay | Admitting: Cardiology

## 2011-06-01 NOTE — Telephone Encounter (Signed)
Calling for test result from tuesdays myoview/tmj

## 2011-06-04 NOTE — Progress Notes (Signed)
Please see dictated report for Stress Nuclear Study.   

## 2011-06-13 ENCOUNTER — Encounter: Payer: Self-pay | Admitting: Cardiology

## 2011-06-13 DIAGNOSIS — F17201 Nicotine dependence, unspecified, in remission: Secondary | ICD-10-CM | POA: Insufficient documentation

## 2011-07-10 LAB — DIFFERENTIAL
Basophils Relative: 0
Eosinophils Absolute: 0.2
Lymphs Abs: 1.1
Monocytes Absolute: 0.8 — ABNORMAL HIGH
Monocytes Relative: 6
Neutrophils Relative %: 85 — ABNORMAL HIGH

## 2011-07-10 LAB — CBC
HCT: 44.7
Hemoglobin: 15.1
MCHC: 33.8
MCV: 89.1
RBC: 5.01
WBC: 14.5 — ABNORMAL HIGH

## 2011-07-10 LAB — CULTURE, BLOOD (ROUTINE X 2): Report Status: 8162008

## 2011-07-10 LAB — BASIC METABOLIC PANEL
CO2: 28
Chloride: 101
GFR calc Af Amer: >60
Potassium: 4.4
Sodium: 136

## 2011-09-29 ENCOUNTER — Ambulatory Visit (HOSPITAL_COMMUNITY)
Admission: RE | Admit: 2011-09-29 | Discharge: 2011-09-29 | Disposition: A | Discharge status: Home / Self Care | Payer: Medicare Other | Source: Ambulatory Visit | Attending: Family Medicine | Admitting: Family Medicine

## 2011-09-29 ENCOUNTER — Other Ambulatory Visit (HOSPITAL_COMMUNITY): Payer: Self-pay | Admitting: Family Medicine

## 2011-09-29 DIAGNOSIS — J449 Chronic obstructive pulmonary disease, unspecified: Secondary | ICD-10-CM | POA: Insufficient documentation

## 2011-09-29 DIAGNOSIS — J4489 Other specified chronic obstructive pulmonary disease: Secondary | ICD-10-CM | POA: Insufficient documentation

## 2011-09-29 DIAGNOSIS — M549 Dorsalgia, unspecified: Secondary | ICD-10-CM

## 2011-09-29 DIAGNOSIS — R0789 Other chest pain: Secondary | ICD-10-CM | POA: Insufficient documentation

## 2012-03-22 IMAGING — CR DG CHEST 2V
2 series · 2 of 2 positions shown · non-contrast
Comparison: 04/03/2008

CLINICAL DATA: Foreign body in throat, cough, patient believes he
swallowed insulation

CHEST - 2 VIEW

[view not recorded (1 of 2)]
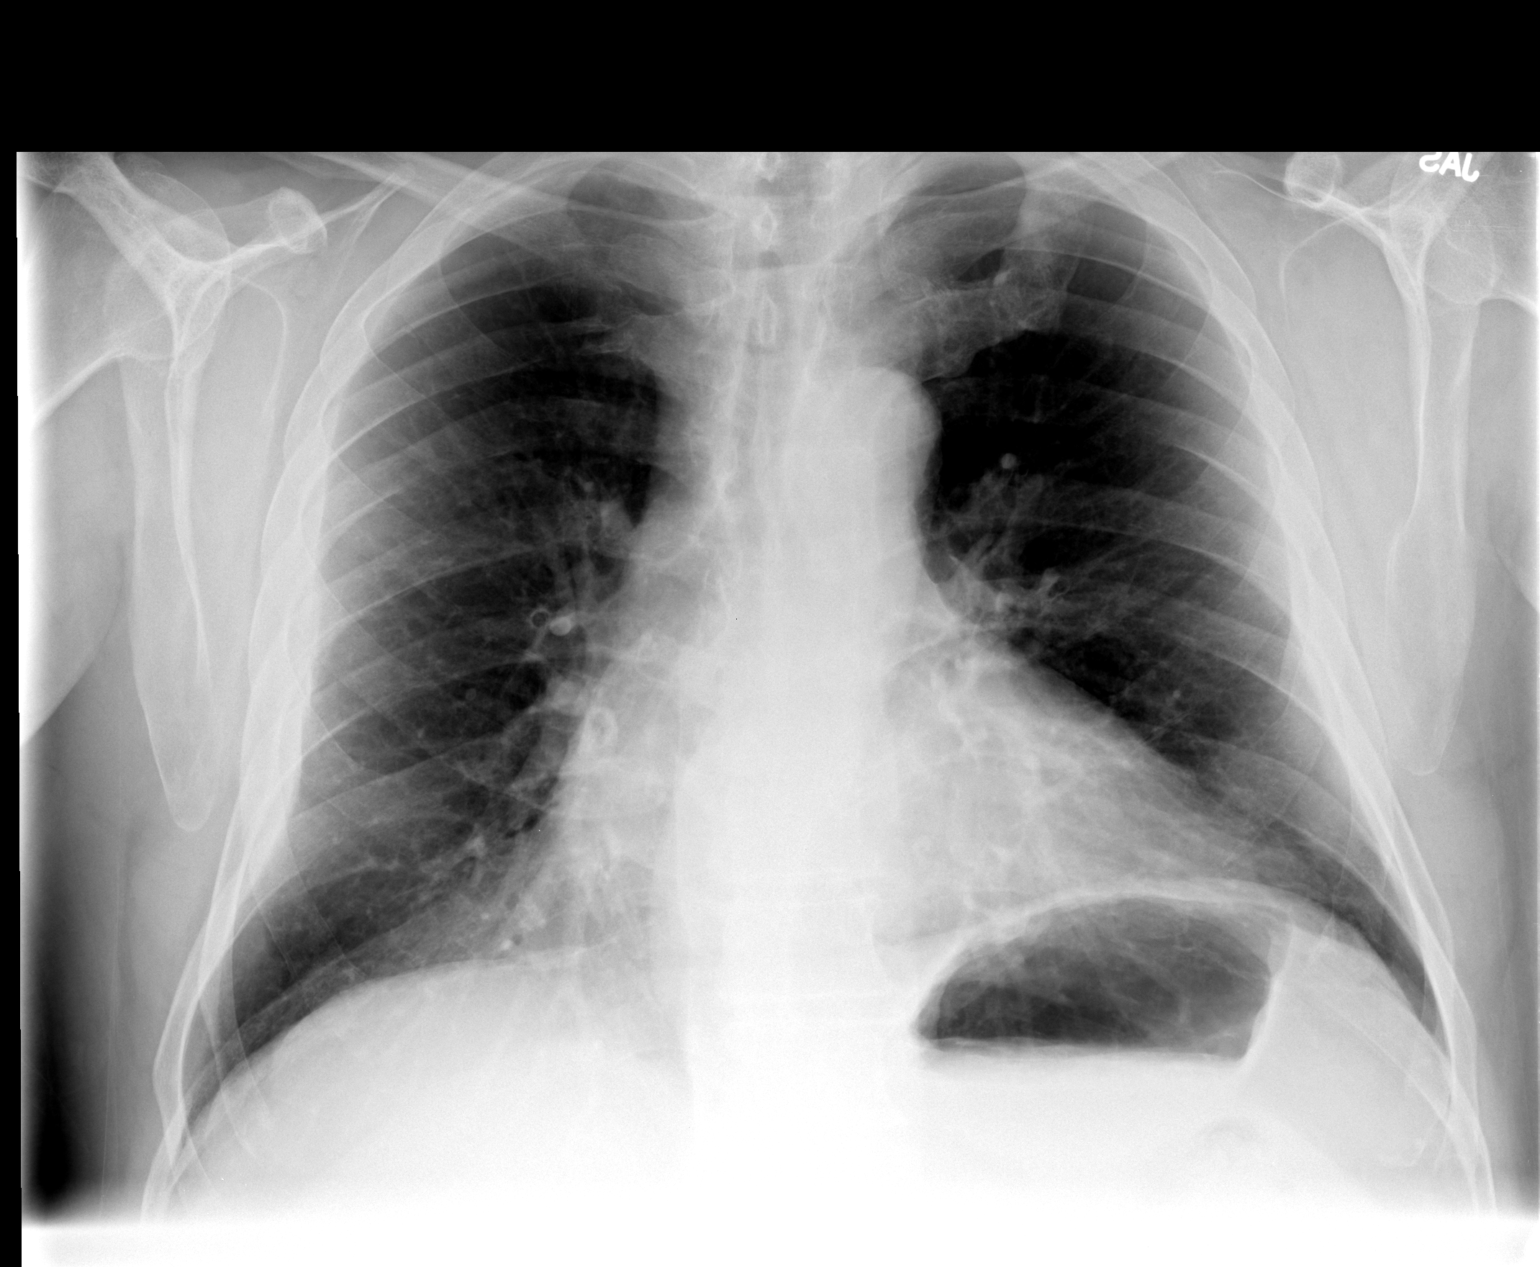

[view not recorded (2 of 2)]
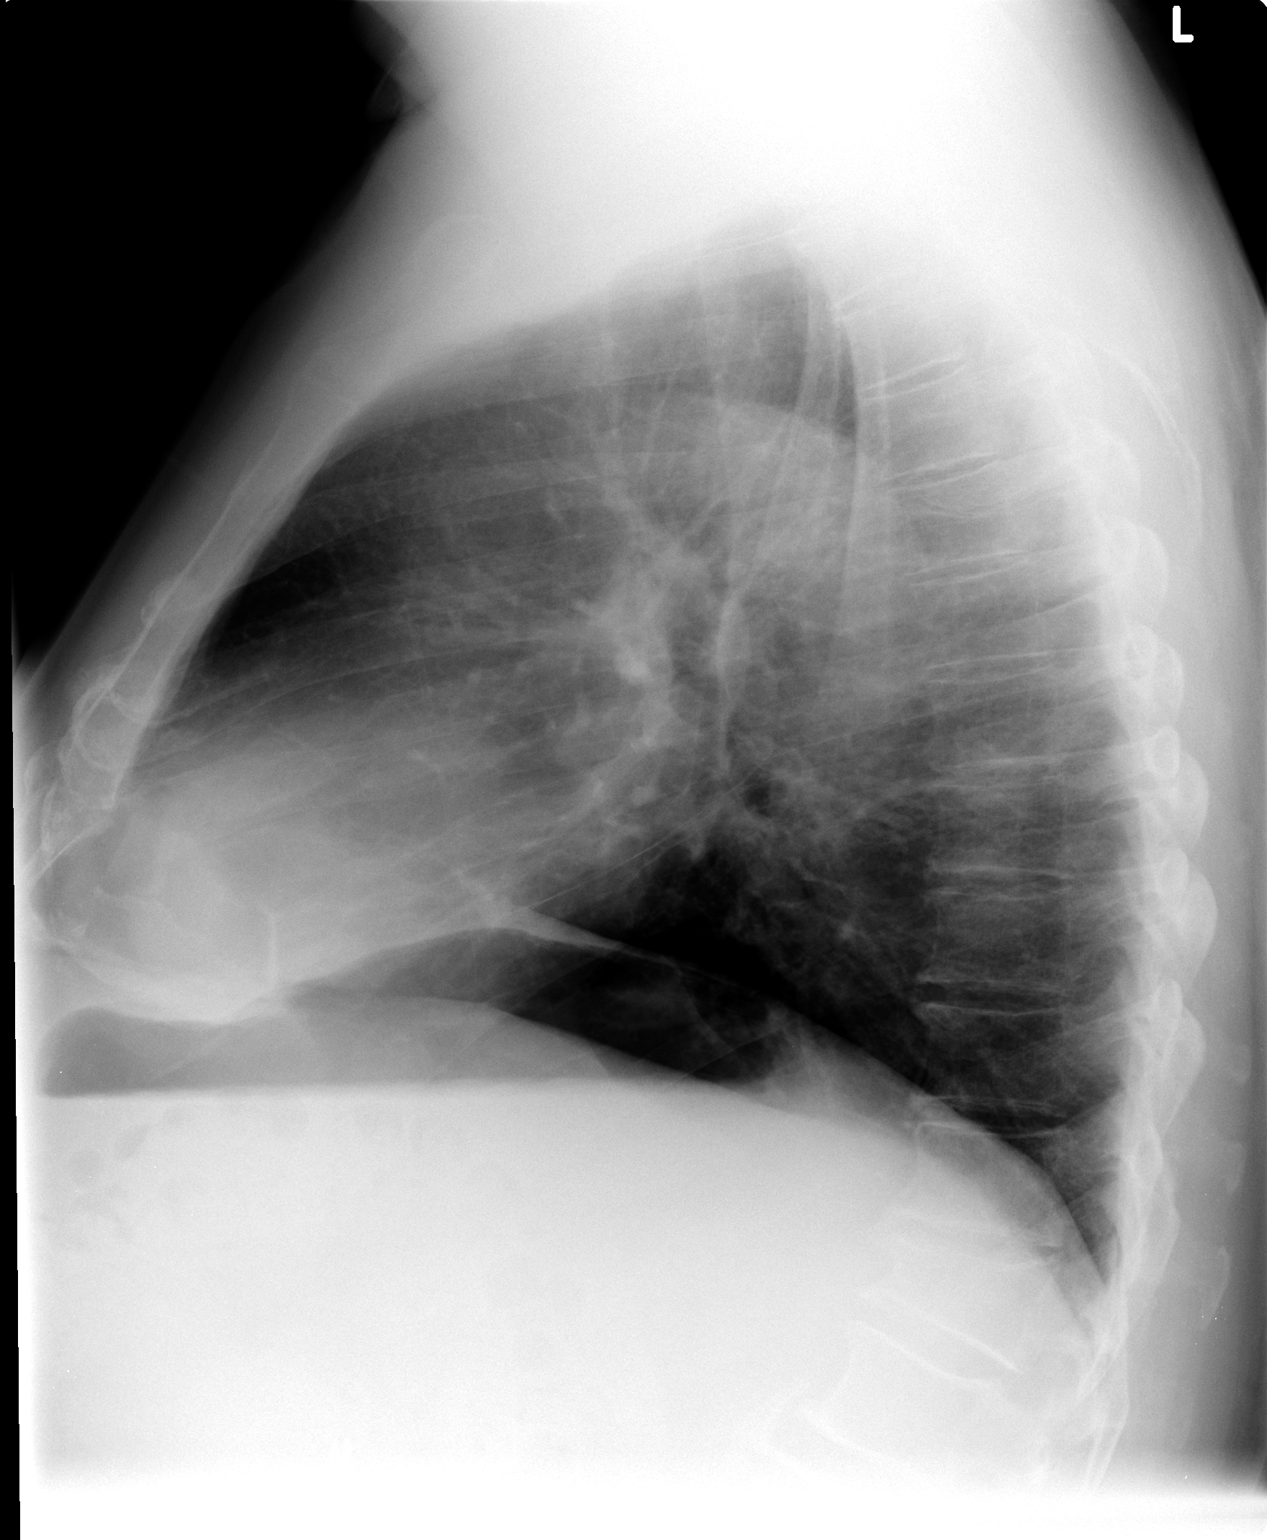

[2 of 2 positions shown; findings below may reference images not displayed]

FINDINGS: Cardiac enlargement.
Tortuous thoracic aorta.
Mediastinal contours and pulmonary vascularity normal.
Chronic bronchitic changes without infiltrate or effusion.
Minimal elevation left diaphragm.
Chronic compression deformity of a vertebra at the thoracolumbar
junction, stable.
Bones appear mildly demineralized.
IMPRESSION: Chronic bronchitic changes.
Cardiomegaly.

## 2012-07-05 ENCOUNTER — Other Ambulatory Visit (HOSPITAL_COMMUNITY): Payer: Self-pay | Admitting: Family Medicine

## 2012-07-05 ENCOUNTER — Ambulatory Visit (HOSPITAL_COMMUNITY)
Admission: RE | Admit: 2012-07-05 | Discharge: 2012-07-05 | Disposition: A | Discharge status: Home / Self Care | Payer: Medicare Other | Source: Ambulatory Visit | Attending: Family Medicine | Admitting: Family Medicine

## 2012-07-05 DIAGNOSIS — M549 Dorsalgia, unspecified: Secondary | ICD-10-CM

## 2012-07-05 DIAGNOSIS — M546 Pain in thoracic spine: Secondary | ICD-10-CM | POA: Insufficient documentation

## 2012-12-13 ENCOUNTER — Other Ambulatory Visit (HOSPITAL_COMMUNITY): Payer: Self-pay | Admitting: Family Medicine

## 2012-12-13 ENCOUNTER — Ambulatory Visit (HOSPITAL_COMMUNITY)
Admission: RE | Admit: 2012-12-13 | Discharge: 2012-12-13 | Disposition: A | Discharge status: Home / Self Care | Payer: Medicare Other | Source: Ambulatory Visit | Attending: Family Medicine | Admitting: Family Medicine

## 2012-12-13 DIAGNOSIS — R059 Cough, unspecified: Secondary | ICD-10-CM | POA: Insufficient documentation

## 2012-12-13 DIAGNOSIS — R05 Cough: Secondary | ICD-10-CM | POA: Insufficient documentation

## 2012-12-13 DIAGNOSIS — J209 Acute bronchitis, unspecified: Secondary | ICD-10-CM | POA: Insufficient documentation

## 2012-12-31 NOTE — H&P (Signed)
  NTS SOAP Note  Vital Signs:  Vitals as of: 12/31/2012: Systolic 123: Diastolic 67: Heart Rate 55: Temp 98.23F: Height 6ft 11in: Weight 196Lbs 0 Ounces: BMI 27.34  BMI : 27.34 kg/m2  Subjective: This 36 Years 58 Months old Male presents for screening TCS.  Denies any active GI complaints.  No family h/o colon cancer.  Never has had a colonoscopy.   Review of Symptoms:  Constitutional:unremarkable   Head:unremarkable    Eyes:  blurred vision bilateral Nose/Mouth/Throat:unremarkable Cardiovascular:  unremarkable   Respiratory:  wheezing,cough Gastrointestinal:  unremarkable   Genitourinary:unremarkable       joint pain Skin:unremarkable Hematolgic/Lymphatic:unremarkable     Allergic/Immunologic:unremarkable     Past Medical History:    Reviewed   Past Medical History  Surgical History: unknown Medical Problems:  Diabetes, High Blood pressure, High cholesterol, sinoatrial node dysfunction Allergies: nkda Medications: ibuprofen, lisinopril, metformin, vicodin, presastatin, metoprolol   Social History:Reviewed  Social History  Preferred Language: English Race:  White Ethnicity: Not Hispanic / Latino Age: 72 Years 5 Months Marital Status:  M Alcohol:  No Recreational drug(s):  No   Smoking Status: Never smoker reviewed on 12/31/2012 Functional Status reviewed on mm/dd/yyyy ------------------------------------------------ Bathing: Normal Cooking: Normal Dressing: Normal Driving: Normal Eating: Normal Managing Meds: Normal Oral Care: Normal Shopping: Normal Toileting: Normal Transferring: Normal Walking: Normal Cognitive Status reviewed on mm/dd/yyyy ------------------------------------------------ Attention: Normal Decision Making: Normal Language: Normal Memory: Normal Motor: Normal Perception: Normal Problem Solving: Normal Visual and Spatial: Normal   Family History:  Reviewed   Family History  Is  there a family history of:No family h/o colon cancer.    Objective Information: General:  Well appearing, well nourished in no distress. Heart:  RRR, no murmur Lungs:    CTA bilaterally, no wheezes, rhonchi, rales.  Breathing unlabored. Abdomen:Soft, NT/ND, no HSM, no masses.   deferred to procedure  Assessment:Need for screening TCS  Diagnosis &amp; Procedure Smart Code   Plan: Scheduled for TCS on 01/14/13.  Patient Education:Alternative treatments to surgery were discussed with patient (and family).  Risks and benefits  of procedure were fully explained to the patient (and family) who gave informed consent. Patient/family questions were addressed.  Follow-up:Pending Surgery

## 2013-01-03 ENCOUNTER — Encounter (HOSPITAL_COMMUNITY): Payer: Self-pay | Admitting: Pharmacy Technician

## 2013-01-14 ENCOUNTER — Ambulatory Visit (HOSPITAL_COMMUNITY)
Admission: RE | Admit: 2013-01-14 | Discharge: 2013-01-14 | Disposition: A | Discharge status: Home / Self Care | Payer: Medicare Other | Source: Ambulatory Visit | Attending: General Surgery | Admitting: General Surgery

## 2013-01-14 ENCOUNTER — Encounter (HOSPITAL_COMMUNITY)
Admission: RE | Disposition: A | Discharge status: Home / Self Care | Payer: Self-pay | Source: Ambulatory Visit | Attending: General Surgery

## 2013-01-14 ENCOUNTER — Encounter (HOSPITAL_COMMUNITY): Payer: Self-pay | Admitting: *Deleted

## 2013-01-14 DIAGNOSIS — Z01812 Encounter for preprocedural laboratory examination: Secondary | ICD-10-CM | POA: Insufficient documentation

## 2013-01-14 DIAGNOSIS — K5730 Diverticulosis of large intestine without perforation or abscess without bleeding: Secondary | ICD-10-CM | POA: Insufficient documentation

## 2013-01-14 DIAGNOSIS — Z1211 Encounter for screening for malignant neoplasm of colon: Secondary | ICD-10-CM | POA: Insufficient documentation

## 2013-01-14 DIAGNOSIS — I10 Essential (primary) hypertension: Secondary | ICD-10-CM | POA: Insufficient documentation

## 2013-01-14 DIAGNOSIS — E119 Type 2 diabetes mellitus without complications: Secondary | ICD-10-CM | POA: Insufficient documentation

## 2013-01-14 HISTORY — DX: Unspecified osteoarthritis, unspecified site: M19.90

## 2013-01-14 HISTORY — PX: COLONOSCOPY: SHX5424

## 2013-01-14 SURGERY — COLONOSCOPY
Anesthesia: Moderate Sedation

## 2013-01-14 MED ORDER — MIDAZOLAM HCL 5 MG/5ML IJ SOLN
INTRAMUSCULAR | Status: DC | PRN
  Administered 2013-01-14: 3 mg via INTRAVENOUS

## 2013-01-14 MED ORDER — MIDAZOLAM HCL 5 MG/5ML IJ SOLN
INTRAMUSCULAR | Status: AC
  Filled 2013-01-14: qty 5

## 2013-01-14 MED ORDER — MEPERIDINE HCL 50 MG/ML IJ SOLN
INTRAMUSCULAR | Status: AC
  Filled 2013-01-14: qty 1

## 2013-01-14 MED ORDER — STERILE WATER FOR IRRIGATION IR SOLN
Status: DC | PRN
  Administered 2013-01-14: 09:00:00

## 2013-01-14 MED ORDER — SODIUM CHLORIDE 0.9 % IV SOLN
INTRAVENOUS | Status: DC
  Administered 2013-01-14: 09:00:00 via INTRAVENOUS

## 2013-01-14 MED ORDER — MEPERIDINE HCL 25 MG/ML IJ SOLN
INTRAMUSCULAR | Status: DC | PRN
  Administered 2013-01-14: 50 mg via INTRAVENOUS

## 2013-01-14 NOTE — Op Note (Signed)
[facility] [address] [place], [address]   COLONOSCOPY PROCEDURE REPORT  PATIENT: Shawn, Lopez  MR#: [id number] BIRTHDATE: 11/28/1940 , 71  yrs. old GENDER: Male ENDOSCOPIST: Franky Macho, MD REFERRED QI:[id number], Brett Canales PROCEDURE DATE:  01/14/2013 PROCEDURE:   Colonoscopy, screening ASA CLASS:   Class II INDICATIONS:Average risk patient for colon cancer. MEDICATIONS: Versed 3 mg IV and Demerol 50 mg IV  DESCRIPTION OF PROCEDURE:   After the risks benefits and alternatives of the procedure were thoroughly explained, informed consent was obtained.  A digital rectal exam revealed no abnormalities of the rectum.   The EC-[id number] ([id number])  endoscope was introduced through the anus and advanced to the cecum, which was identified by both the appendix and ileocecal valve. No adverse events experienced.   The quality of the prep was adequate, using MoviPrep  The instrument was then slowly withdrawn as the colon was fully examined.      COLON FINDINGS: There was mild scattered diverticulosis noted in the descending colon.   The colon mucosa was otherwise normal. Retroflexed views revealed no abnormalities. The time to cecum=2 minutes 0 seconds.  Withdrawal time=4 minutes 0 seconds.  The scope was withdrawn and the procedure completed. COMPLICATIONS: There were no complications.  ENDOSCOPIC IMPRESSION: 1.   There was mild diverticulosis noted in the descending colon 2.   The colon mucosa was otherwise normal  RECOMMENDATIONS: Repeat Colonscopy in 10 years.   eSigned:  Franky Macho, MD 01/14/2013 9:20 AM   cc:

## 2013-01-14 NOTE — Interval H&P Note (Signed)
History and Physical Interval Note:  01/14/2013 8:59 AM  Shawn Lopez  has presented today for surgery, with the diagnosis of screening  The various methods of treatment have been discussed with the patient and family. After consideration of risks, benefits and other options for treatment, the patient has consented to  Procedure(s): COLONOSCOPY (N/A) as a surgical intervention .  The patient's history has been reviewed, patient examined, no change in status, stable for surgery.  I have reviewed the patient's chart and labs.  Questions were answered to the patient's satisfaction.     Franky Macho A

## 2013-01-15 ENCOUNTER — Encounter (HOSPITAL_COMMUNITY): Payer: Self-pay | Admitting: General Surgery

## 2013-03-19 ENCOUNTER — Ambulatory Visit (HOSPITAL_COMMUNITY)
Admission: RE | Admit: 2013-03-19 | Discharge: 2013-03-19 | Disposition: A | Discharge status: Home / Self Care | Payer: Medicare Other | Source: Ambulatory Visit | Attending: Family Medicine | Admitting: Family Medicine

## 2013-03-19 ENCOUNTER — Other Ambulatory Visit (HOSPITAL_COMMUNITY): Payer: Self-pay | Admitting: Family Medicine

## 2013-03-19 DIAGNOSIS — M542 Cervicalgia: Secondary | ICD-10-CM

## 2013-10-14 ENCOUNTER — Ambulatory Visit (INDEPENDENT_AMBULATORY_CARE_PROVIDER_SITE_OTHER): Payer: Medicare Other | Admitting: Orthopedic Surgery

## 2013-10-14 ENCOUNTER — Encounter: Payer: Self-pay | Admitting: Orthopedic Surgery

## 2013-10-14 ENCOUNTER — Ambulatory Visit (INDEPENDENT_AMBULATORY_CARE_PROVIDER_SITE_OTHER): Payer: Medicare Other

## 2013-10-14 VITALS — BP 133/70 | Ht 71.0 in | Wt 197.0 lb

## 2013-10-14 DIAGNOSIS — M25511 Pain in right shoulder: Secondary | ICD-10-CM

## 2013-10-14 DIAGNOSIS — M25519 Pain in unspecified shoulder: Secondary | ICD-10-CM

## 2013-10-14 DIAGNOSIS — S43429A Sprain of unspecified rotator cuff capsule, initial encounter: Secondary | ICD-10-CM

## 2013-10-14 DIAGNOSIS — M75101 Unspecified rotator cuff tear or rupture of right shoulder, not specified as traumatic: Secondary | ICD-10-CM

## 2013-10-14 NOTE — Patient Instructions (Signed)
  Call to arrange therapy at [facility]    Joint Injection  Care After  Refer to this sheet in the next few days. These instructions provide you with information on caring for yourself after you have had a joint injection. Your caregiver also may give you more specific instructions. Your treatment has been planned according to current medical practices, but problems sometimes occur. Call your caregiver if you have any problems or questions after your procedure.  After any type of joint injection, it is not uncommon to experience:  Soreness, swelling, or bruising around the injection site.  Mild numbness, tingling, or weakness around the injection site caused by the numbing medicine used before or with the injection. It also is possible to experience the following effects associated with the specific agent after injection:  Iodine-based contrast agents:  Allergic reaction (itching, hives, widespread redness, and swelling beyond the injection site).  Corticosteroids (These effects are rare.):  Allergic reaction.  Increased blood sugar levels (If you have diabetes and you notice that your blood sugar levels have increased, notify your caregiver).  Increased blood pressure levels.  Mood swings.  Hyaluronic acid in the use of viscosupplementation.  Temporary heat or redness.  Temporary rash and itching.  Increased fluid accumulation in the injected joint. These effects all should resolve within a day after your procedure.  HOME CARE INSTRUCTIONS  Limit yourself to light activity the day of your procedure. Avoid lifting heavy objects, bending, stooping, or twisting.  Take prescription or over-the-counter pain medication as directed by your caregiver.  You may apply ice to your injection site to reduce pain and swelling the day of your procedure. Ice may be applied 3-4 times:  Put ice in a plastic bag.  Place a towel between your skin and the bag.  Leave the ice on for no longer than 15-20  minutes each time. SEEK IMMEDIATE MEDICAL CARE IF:  Pain and swelling get worse rather than better or extend beyond the injection site.  Numbness does not go away.  Blood or fluid continues to leak from the injection site.  You have chest pain.  You have swelling of your face or tongue.  You have trouble breathing or you become dizzy.  You develop a fever, chills, or severe tenderness at the injection site that last longer than 1 day. MAKE SURE YOU:  Understand these instructions.  Watch your condition.  Get help right away if you are not doing well or if you get worse. Document Released: 05/25/2011 Document Revised: 12/04/2011 Document Reviewed: 05/25/2011  ExitCare Patient Information 2014 [employer].   

## 2013-10-14 NOTE — Progress Notes (Signed)
   Subjective:    Patient ID: Shawn Lopez, male    DOB: 03/14/1941, 73 y.o.   MRN: [id number]  HPI Comments: Previous treatment to date has only included oral medications  Shoulder Pain  The pain is present in the right shoulder. This is a chronic problem. The current episode started more than 1 year ago. There has been no history of extremity trauma. The problem occurs intermittently. The problem has been gradually worsening. The quality of the pain is described as aching and pounding. The pain is at a severity of 10/10. The pain is severe. Associated symptoms include joint locking, a limited range of motion and stiffness. Pertinent negatives include no fever, itching, numbness or tingling.      Review of Systems  Constitutional: Negative for fever.  Musculoskeletal: Positive for stiffness.  Skin: Negative for itching.  Neurological: Negative for tingling and numbness.       Objective:   Physical Exam  Vitals reviewed. Constitutional: He is oriented to person, place, and time. He appears well-developed and well-nourished. No distress.  Lymphadenopathy:       Right cervical: No superficial cervical adenopathy present.      Left cervical: No superficial cervical adenopathy present.  Neurological: He is alert and oriented to person, place, and time. He has normal reflexes. He exhibits normal muscle tone. Coordination normal.  Skin: Skin is warm and dry. No rash noted. He is not diaphoretic. No erythema. No pallor.  Psychiatric: He has a normal mood and affect. His behavior is normal. Judgment and thought content normal.  Right Shoulder Exam   Tenderness  The patient is experiencing tenderness in the acromion.  Range of Motion  Active Abduction: 80  Forward Flexion: 100  External Rotation: 50  Internal Rotation 0 degrees: abnormal  Internal Rotation 90 degrees: abnormal   Muscle Strength  Abduction: 4/5  Internal Rotation: 5/5  External Rotation: 4/5  Supraspinatus:  4/5  Subscapularis: 5/5  Biceps: 5/5   Tests  Apprehension: negative Cross Arm: negative Drop Arm: negative Impingement: positive Sulcus: absent  Other  Erythema: absent Scars: absent Sensation: normal Pulse: present   Left Shoulder Exam  Left shoulder exam is normal.  Tenderness  The patient is experiencing no tenderness.     Range of Motion  The patient has normal left shoulder ROM.  Muscle Strength  The patient has normal left shoulder strength.  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

## 2013-10-20 ENCOUNTER — Ambulatory Visit (HOSPITAL_COMMUNITY)
Admission: RE | Admit: 2013-10-20 | Discharge: 2013-10-20 | Disposition: A | Discharge status: Home / Self Care | Payer: Medicare Other | Source: Ambulatory Visit | Attending: Orthopedic Surgery | Admitting: Orthopedic Surgery

## 2013-10-20 DIAGNOSIS — M75101 Unspecified rotator cuff tear or rupture of right shoulder, not specified as traumatic: Secondary | ICD-10-CM

## 2013-10-20 DIAGNOSIS — M25519 Pain in unspecified shoulder: Secondary | ICD-10-CM | POA: Insufficient documentation

## 2013-10-20 DIAGNOSIS — IMO0001 Reserved for inherently not codable concepts without codable children: Secondary | ICD-10-CM | POA: Insufficient documentation

## 2013-10-20 DIAGNOSIS — M62838 Other muscle spasm: Secondary | ICD-10-CM | POA: Insufficient documentation

## 2013-10-20 DIAGNOSIS — E119 Type 2 diabetes mellitus without complications: Secondary | ICD-10-CM | POA: Insufficient documentation

## 2013-10-20 DIAGNOSIS — E785 Hyperlipidemia, unspecified: Secondary | ICD-10-CM | POA: Insufficient documentation

## 2013-10-20 DIAGNOSIS — I10 Essential (primary) hypertension: Secondary | ICD-10-CM | POA: Insufficient documentation

## 2013-10-20 DIAGNOSIS — M25619 Stiffness of unspecified shoulder, not elsewhere classified: Secondary | ICD-10-CM | POA: Insufficient documentation

## 2013-10-20 NOTE — Evaluation (Signed)
Occupational Therapy Evaluation  Patient Details  Name: Shawn Lopez MRN: [id number] Date of Birth: 1940/12/30  Today's Date: 10/20/2013 Time: 1100-1145 OT Time Calculation (min): 45 min OT Evaluation 1100-1125 25' Manual Therapy 1125-1145 20' Visit#: 1 of 18  Re-eval: 11/17/13  Assessment Diagnosis: Right Rotator Cuff Tear Next MD Visit: 12/16/13 Prior Therapy: n/a  Authorization: [employer] Medicare  Authorization Time Period: before 10th visit  Authorization Visit#: 1 of 10   Past Medical History:  Past Medical History  Diagnosis Date  . Diabetes mellitus   . Osteopenia     by bone densitometry  . Hyperlipidemia   . Atrial fibrillation 2006    Echocardiogram in 2006-mild LVH with normal EF  . Chronic back pain     Prior trauma with vertebral fracture  . Diabetes mellitus     CBGs 150-170 in 2011; 2112- treated with single oral agent  . Borderline hypertension   . Chest pain 2001    negative stress nuclear in 2006; negative coronary angiography(no documentation); essentially normal PFTs in 07/2004; CT in 2012-lipomatous hypertrophy; mild atherosclerosis of the aorta and coronaries; mild dependent pulmonary scarring or atelectasis; small area of focal pleural thickening; chronic bronchitic changes on chest x-ray  . 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 Arthritis    Past Surgical History:  Past Surgical History  Procedure Laterality Date  . Appendectomy  1960  . Circumcision  2010    meatotomy and dilatation  . Cataract extraction  2012    right eye,lens implantation  . Cholecystectomy    . Colonoscopy N/A 01/14/2013    Procedure: COLONOSCOPY;  Surgeon: Dalia HeadingMark A Jenkins, MD;  Location: [facility];  Service: Gastroenterology;  Laterality: N/A;    Subjective  S:  I have had pain and limited mobiity in my right shoulder for at least a year. Pertinent History: Mr. Shawn Lopez reports increased pain and limited mobiilty in his right shoulder for the past year.  He  consulted with his primary care physician and was referred to Dr. Romeo Lopez.  An x ray determined bony changes in the shoulder and a diagnosis of a probable rotator cuff tear was given.  Dr. Romeo Lopez gave the patient a cortisone injection and referred him to occupational therapy for evaluation and treatment.  Special Tests: FOTO 63 Patient Stated Goals: To get my arm better Pain Assessment Currently in Pain?: Yes Pain Score: 6  Pain Location: Shoulder Pain Orientation: Left Pain Type: Acute pain  Precautions/Restrictions  Precautions Precautions: None Restrictions Weight Bearing Restrictions: No  Balance Screening Balance Screen Has the patient fallen in the past 6 months: No Has the patient had a decrease in activity level because of a fear of falling? : No Is the patient reluctant to leave their home because of a fear of falling? 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  Difficult to get comfortable at night, difficult to reach behind his head, shoulder locks on occasion when reaching to his side Dominant Hand: Right Vision - History Baseline Vision: Wears glasses all the time Perception Perception: Within Functional Limits Praxis Praxis: Intact  Cognition/Observation Cognition Overall Cognitive Status: Within Functional Limits for tasks assessed  Sensation/Coordination/Edema Sensation Light Touch: Appears Intact Coordination Gross Motor Movements are Fluid and Coordinated:  Yes Fine Motor Movements are Fluid and Coordinated: Yes  Additional Assessments RUE AROM (degrees) RUE Overall AROM Comments: assessed in seated, ER/IR with shoulder  abducted Right Shoulder Flexion: 135 Degrees Right Shoulder ABduction: 180 Degrees Right Shoulder Internal Rotation: 55 Degrees Right Shoulder External Rotation: 65 Degrees RUE PROM (degrees) RUE Overall PROM Comments: assessed in supine, Flexion and Abduction WFL, ER/IR 75% RUE Strength RUE Overall Strength Comments: assessed in seated, ER/IR with shoulder adducted Right Shoulder Flexion: 4/5 Right Shoulder ABduction: 4/5 Right Shoulder Internal Rotation: 4/5 Right Shoulder External Rotation: 4/5 Palpation Palpation: mod-max fascial restrictions and muscle knots in right scapular region and right shoulder/upper arm     Exercise/Treatments    Manual Therapy Manual Therapy: Myofascial release Myofascial Release: MFR and manual stretching to right upper arm, scapular, and shoulder region to decrease pain and fascial restrictions and improve pain free mobility in his right shoulder region.   Occupational Therapy Assessment and Plan OT Assessment and Plan Clinical Impression Statement: A:  Patient presents with decreased mobility and strength in his right shoulder causing increased pain and restrictions and decreased use of his RUE with daily tasks.  Pt will benefit from skilled therapeutic intervention in order to improve on the following deficits: Decreased range of motion;Decreased strength;Increased muscle spasms;Increased fascial restricitons;Pain Rehab Potential: Good OT Frequency: Min 3X/week OT Duration: 6 weeks OT Treatment/Interventions: Self-care/ADL training;Therapeutic exercise;Modalities;Manual therapy;Patient/family education;Therapeutic activities OT Plan: P: Skilled OT intervention to decrease pain and fascial restrictions and improve pain free mobility needed to return to full use of RUE as dominant with daily activities.  Treatment Plan:  MFR and manual stretching, AAROM progressing to AROM, scapular stability exercises, progress as tolerated.    Goals Short Term  Goals Time to Complete Short Term Goals: 3 weeks Short Term Goal 1: Patient will be educated on HEP. Short Term Goal 2: Patient will improve PROM to [facility] in his right shoulder for increased ability to get comfortable when sleeping. Short Term Goal 3: Patient will have 4+/5 strength in his right shoulder in order to lift equipment on the job site. Short Term Goal 4: Patient will decrease pain in his right shoulder to 4/10 when completing daily activities.  Short Term Goal 5: Patient will decrease fascial restrictions to moderate in his right shoulder region.  Long Term Goals Time to Complete Long Term Goals: 6 weeks Long Term Goal 1: Patient will return to prior level of independence will all work, daily, and leisure activities.  Long Term Goal 2: Patient will improve AROM to WNL in his right shoulder for increased ability to reach overhead at work. Long Term Goal 3: Patient will have 5/5 strength in his right shoulder in order to lift equipment on the job site. Long Term Goal 4: Patient will decrease pain in his right shoulder to 2/10 when sleeping at night.  Long Term Goal 5: Patient will decrease fascial restrictions to minimal in his right shoulder region.   Problem List Patient Active Problem List   Diagnosis Date Noted  . Pain in joint, shoulder region 10/20/2013  . Muscle spasm of right shoulder 10/20/2013  . Right rotator cuff tear 10/14/2013  . 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 Atrial fibrillation   . Hypertension   . Hyperlipidemia   . Diabetes mellitus   . Osteopenia   . Chronic back pain   . Chest pain     End of Session Activity Tolerance: Patient tolerated treatment well General Behavior During Therapy: [facility] for tasks  assessed/performed OT Plan of Care OT Home Exercise Plan: educated on shoulder stretches and dowel rod exercises  OT Patient Instructions: scanned Consulted and Agree with Plan of Care: Patient  GO Functional Assessment Tool Used: FOTO scored  63 Functional Limitation: Carrying, moving and handling objects Carrying, Moving and Handling Objects Current Status ([id number]): At least 20 percent but less than 40 percent impaired, limited or restricted Carrying, Moving and Handling Objects Goal Status [phone]): At least 20 percent but less than 40 percent impaired, limited or restricted  Shirlean Mylar, OTR/L  10/20/2013, 12:08 PM  Physician Documentation Your signature is required to indicate approval of the treatment plan as stated above.  Please sign and either send electronically or make a copy of this report for your files and return this physician signed original.  Please Shawn one 1.__approve of plan  2. ___approve of plan with the following conditions.   ______________________________                                                          _____________________ Physician Signature                                                                                                             Date

## 2013-10-22 ENCOUNTER — Ambulatory Visit (HOSPITAL_COMMUNITY)
Admission: RE | Admit: 2013-10-22 | Discharge: 2013-10-22 | Disposition: A | Discharge status: Home / Self Care | Payer: Medicare Other | Source: Ambulatory Visit | Attending: Family Medicine | Admitting: Family Medicine

## 2013-10-22 NOTE — Progress Notes (Signed)
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 I can do whatever I want to do.  Pain Assessment Currently in Pain?: No/denies   Exercise/Treatments Supine Protraction: PROM;AAROM;10 reps Horizontal ABduction: PROM;AAROM;10 reps External Rotation: PROM;AAROM;10 reps Internal Rotation: PROM;AAROM;10 reps Flexion: PROM;AAROM;10 reps ABduction: PROM;AAROM;10 reps Seated Elevation: AROM;10 reps Extension: AROM;10 reps Retraction: AROM;10 reps Therapy Ball Flexion: 10 reps ABduction: 10 reps      Manual Therapy Manual Therapy: Myofascial release Myofascial Release: MFR and manual stretching to right upper arm, scapular, and shoulder region to decrease pain and fascial restrictions and improve pain free mobility in his right shoulder region  Occupational Therapy Assessment and Plan OT Assessment and Plan Clinical Impression Statement: A:  Patient with reported decreased pain this date.  Patient with noted decreased fascial restrictions this date.  Initiated supine exercises, therapy ball and seated elevation, extension and retraction with no complaint of incrased pain.  patient reports fatigue, continued no pain at end of session.   OT Plan: P:  cont MFR, manual stretches, initiate scapular stability , spine AA/AROM    Goals Short Term Goals Short Term Goal 1: Patient will be educated on HEP. Short Term Goal 1 Progress: Progressing toward goal Short Term Goal 2: Patient will improve PROM to [facility] in his right shoulder for increased ability to  get comfortable when sleeping. Short Term Goal 2 Progress: Progressing toward goal Short Term Goal 3: Patient will have 4+/5 strength in his right shoulder in order to lift equipment on the job site. Short Term Goal 3 Progress: Progressing toward goal Short Term Goal 4: Patient will decrease pain in his right shoulder to 4/10 when completing daily activities.  Short Term Goal 4 Progress: Progressing toward goal Short Term Goal 5: Patient will decrease fascial restrictions to moderate in his right shoulder region.  Short Term Goal 5 Progress: Progressing toward goal Long Term Goals Long Term Goal 1: Patient will return to prior level of independence will all work, daily, and leisure activities.  Long Term Goal 1 Progress: Progressing toward goal Long Term Goal 2: Patient will improve AROM to WNL in his right shoulder for increased ability to reach overhead at work. Long Term Goal 2 Progress: Progressing toward goal Long Term Goal 3: Patient will have 5/5 strength in his right shoulder in order to lift equipment on the job site. Long Term Goal 3 Progress: Progressing toward goal Long Term Goal 4: Patient will decrease pain in his right shoulder to 2/10 when sleeping at night.  Long Term Goal 4 Progress: Progressing toward goal Long Term Goal 5: Patient will decrease fascial restrictions to minimal in his right shoulder region.  Long Term Goal 5 Progress: Progressing toward goal  Problem List Patient Active Problem List   Diagnosis Date Noted  . Pain in joint, shoulder region 10/20/2013  . Muscle spasm of right shoulder 10/20/2013  . Right rotator cuff tear 10/14/2013  . 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 Atrial fibrillation   . Hypertension   . Hyperlipidemia   . Diabetes mellitus   . Osteopenia   .  Chronic back pain   . Chest pain     End of Session Activity Tolerance: Patient tolerated treatment well General Behavior During Therapy: [facility] for tasks  assessed/performed  GO    Velora MediateHeather Benjamin Merrihew, OTR/L  10/22/2013, 10:47 AM

## 2013-10-24 ENCOUNTER — Ambulatory Visit (HOSPITAL_COMMUNITY)
Admission: RE | Admit: 2013-10-24 | Discharge: 2013-10-24 | Disposition: A | Discharge status: Home / Self Care | Payer: Medicare Other | Source: Ambulatory Visit | Attending: Orthopedic Surgery | Admitting: Orthopedic Surgery

## 2013-10-24 DIAGNOSIS — M62838 Other muscle spasm: Secondary | ICD-10-CM

## 2013-10-24 DIAGNOSIS — M25519 Pain in unspecified shoulder: Secondary | ICD-10-CM

## 2013-10-24 NOTE — Progress Notes (Signed)
Occupational Therapy Treatment Patient Details  Name: Shawn Lopez MRN: [id number] Date of Birth: 1941/06/07  Today's Date: 10/24/2013 Time: 2800-3491 OT Time Calculation (min): 38 min Manual therapy 1015-1030 15' Therapeutic exercises 1030-1053 23'  Visit#: 3 of 18  Re-eval: 11/17/13    Authorization: [employer] Medicare  Authorization Time Period: before 10th visit  Authorization Visit#: 3 of 10  Subjective S:  Its felt the best it has in at least 6 months.  I used a hammer yesterday and it didnt bother it.  Pain Assessment Currently in Pain?: No/denies Pain Score: 0-No pain  Precautions/Restrictions   progress as tolerated  Exercise/Treatments Supine Protraction: PROM;AAROM;10 reps Horizontal ABduction: PROM;AAROM;10 reps External Rotation: PROM;AAROM;10 reps Internal Rotation: PROM;AAROM;10 reps Flexion: PROM;AAROM;10 reps ABduction: PROM;AAROM;10 reps Standing Protraction: AAROM;10 reps Horizontal ABduction: AAROM;10 reps External Rotation: AAROM;Theraband;10 reps Theraband Level (Shoulder External Rotation): Level 2 (Red) Internal Rotation: AAROM;Theraband;10 reps Theraband Level (Shoulder Internal Rotation): Level 2 (Red) Flexion: AAROM;10 reps ABduction: AAROM;10 reps Extension: Theraband;10 reps Theraband Level (Shoulder Extension): Level 2 (Red) Row: Theraband;10 reps Theraband Level (Shoulder Row): Level 2 (Red) Retraction: Theraband;10 reps Theraband Level (Shoulder Retraction): Level 2 (Red) ROM / Strengthening / Isometric Strengthening Proximal Shoulder Strengthening, Supine: 10 times each resting one time Ball on Wall: 1' flexed to 90 and 1'abducted Prot/Ret//Elev/Dep:        Manual Therapy Manual Therapy: Myofascial release Myofascial Release: MFR and manual stretching to right upper arm, scapular, and shoulder region to decrease pain and fascial restrictions and improve pain free mobility in his right shoulder region  Occupational Therapy  Assessment and Plan OT Assessment and Plan Clinical Impression Statement: A:  Completed AAROM in supine and standing.  Added several scapular stabiity exercises, required vg and tactile cues for proper positioning with theraband exercises.  OT Plan: P:  Attempt AROM in supine   Goals Short Term Goals Short Term Goal 1: Patient will be educated on HEP. Short Term Goal 2: Patient will improve PROM to [facility] in his right shoulder for increased ability to get comfortable when sleeping. Short Term Goal 3: Patient will have 4+/5 strength in his right shoulder in order to lift equipment on the job site. Short Term Goal 4: Patient will decrease pain in his right shoulder to 4/10 when completing daily activities.  Short Term Goal 5: Patient will decrease fascial restrictions to moderate in his right shoulder region.  Long Term Goals Long Term Goal 1: Patient will return to prior level of independence will all work, daily, and leisure activities.  Long Term Goal 2: Patient will improve AROM to WNL in his right shoulder for increased ability to reach overhead at work. Long Term Goal 3: Patient will have 5/5 strength in his right shoulder in order to lift equipment on the job site. Long Term Goal 4: Patient will decrease pain in his right shoulder to 2/10 when sleeping at night.  Long Term Goal 5: Patient will decrease fascial restrictions to minimal in his right shoulder region.   Problem List Patient Active Problem List   Diagnosis Date Noted  . Pain in joint, shoulder region 10/20/2013  . Muscle spasm of right shoulder 10/20/2013  . Right rotator cuff tear 10/14/2013  . 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 Atrial fibrillation   . Hypertension   . Hyperlipidemia   . Diabetes mellitus   . Osteopenia   . Chronic back pain   . Chest pain     End of Session Activity Tolerance: Patient tolerated  treatment well General Behavior During Therapy: [facility] for tasks assessed/performed  GO     Vangie Bicker, OTR/L   10/24/2013, 11:01 AM

## 2013-10-27 ENCOUNTER — Ambulatory Visit (HOSPITAL_COMMUNITY)
Admission: RE | Admit: 2013-10-27 | Discharge: 2013-10-27 | Disposition: A | Discharge status: Home / Self Care | Payer: Medicare Other | Source: Ambulatory Visit | Attending: Family Medicine | Admitting: Family Medicine

## 2013-10-27 DIAGNOSIS — I10 Essential (primary) hypertension: Secondary | ICD-10-CM | POA: Insufficient documentation

## 2013-10-27 DIAGNOSIS — M25619 Stiffness of unspecified shoulder, not elsewhere classified: Secondary | ICD-10-CM | POA: Insufficient documentation

## 2013-10-27 DIAGNOSIS — E785 Hyperlipidemia, unspecified: Secondary | ICD-10-CM | POA: Insufficient documentation

## 2013-10-27 DIAGNOSIS — M62838 Other muscle spasm: Secondary | ICD-10-CM | POA: Insufficient documentation

## 2013-10-27 DIAGNOSIS — M25519 Pain in unspecified shoulder: Secondary | ICD-10-CM | POA: Insufficient documentation

## 2013-10-27 DIAGNOSIS — IMO0001 Reserved for inherently not codable concepts without codable children: Secondary | ICD-10-CM | POA: Insufficient documentation

## 2013-10-27 DIAGNOSIS — E119 Type 2 diabetes mellitus without complications: Secondary | ICD-10-CM | POA: Insufficient documentation

## 2013-10-27 NOTE — Progress Notes (Signed)
Occupational Therapy Treatment Patient Details  Name: Shawn Lopez MRN: [id number] Date of Birth: 1941-07-01  Today's Date: 10/27/2013 Time: 4098-1191 OT Time Calculation (min): 45 min Manual therapy 939-957 18' Therapeutic exercises [phone] 27' Visit#: 4 of 18  Re-eval: 11/17/13    Authorization: [facility] Medicare  Authorization Time Period: before 10th visit  Authorization Visit#: 4 of 10  Subjective S:  It was a little sore the weekend - I did use a chainsaw quite a bit on Saturday. Pain Assessment Currently in Pain?: Yes Pain Score: 1  Pain Location: Shoulder Pain Orientation: Right Pain Type: Acute pain  Precautions/Restrictions   progress as tolerated  Exercise/Treatments Supine Protraction: PROM;AROM;10 reps Horizontal ABduction: PROM;AROM;10 reps External Rotation: PROM;AROM;10 reps Internal Rotation: PROM;AROM;10 reps Flexion: PROM;AROM;10 reps ABduction: PROM;AROM;10 reps Standing Protraction: AROM;10 reps Horizontal ABduction: AROM;10 reps External Rotation: AROM;10 reps;Theraband;15 reps Theraband Level (Shoulder External Rotation): Level 2 (Red) Internal Rotation: AROM;10 reps;Theraband;15 reps Theraband Level (Shoulder Internal Rotation): Level 2 (Red) Flexion: AROM;10 reps ABduction: AROM;10 reps Extension: Theraband;15 reps Theraband Level (Shoulder Extension): Level 2 (Red) Row: Theraband;15 reps Theraband Level (Shoulder Row): Level 2 (Red) Retraction: Theraband;15 reps Theraband Level (Shoulder Retraction): Level 2 (Red) ROM / Strengthening / Isometric Strengthening UBE (Upper Arm Bike): 3' and 3' 1.5 Proximal Shoulder Strengthening, Supine: 10 times each wihtout rest breaks Proximal Shoulder Strengthening, Seated: 10 times each without rest breaks Ball on Wall: 1' flexed to 90 and 1' abducted - increased difficulty with abduction this date.       Manual Therapy Manual Therapy: Myofascial release Myofascial Release: MFR and manual  stretching to right upper arm, scapular, and shoulder region to decrease pain and fascial restrictions and improve pain free mobility in his right shoulder region  Occupational Therapy Assessment and Plan OT Assessment and Plan Clinical Impression Statement: A:  Patient able to complete AROM in supine and standing this date.  Increased independence with depressing shoulder blade during AROM exercises and scapular stability exercises. OT Plan: P:  Attempt strengthening in supine, add x to v and w arms in standing.    Goals Short Term Goals Short Term Goal 1: Patient will be educated on HEP. Short Term Goal 1 Progress: Progressing toward goal Short Term Goal 2: Patient will improve PROM to [facility] in his right shoulder for increased ability to get comfortable when sleeping. Short Term Goal 2 Progress: Progressing toward goal Short Term Goal 3: Patient will have 4+/5 strength in his right shoulder in order to lift equipment on the job site. Short Term Goal 3 Progress: Progressing toward goal Short Term Goal 4: Patient will decrease pain in his right shoulder to 4/10 when completing daily activities.  Short Term Goal 4 Progress: Progressing toward goal Short Term Goal 5: Patient will decrease fascial restrictions to moderate in his right shoulder region.  Short Term Goal 5 Progress: Progressing toward goal Long Term Goals Long Term Goal 1: Patient will return to prior level of independence will all work, daily, and leisure activities.  Long Term Goal 1 Progress: Progressing toward goal Long Term Goal 2: Patient will improve AROM to WNL in his right shoulder for increased ability to reach overhead at work. Long Term Goal 2 Progress: Progressing toward goal Long Term Goal 3: Patient will have 5/5 strength in his right shoulder in order to lift equipment on the job site. Long Term Goal 3 Progress: Progressing toward goal Long Term Goal 4: Patient will decrease pain in his right shoulder to 2/10 when  sleeping at night.  Long Term Goal 4 Progress: Progressing toward goal Long Term Goal 5: Patient will decrease fascial restrictions to minimal in his right shoulder region.  Long Term Goal 5 Progress: Progressing toward goal  Problem List Patient Active Problem List   Diagnosis Date Noted  . Pain in joint, shoulder region 10/20/2013  . Muscle spasm of right shoulder 10/20/2013  . Right rotator cuff tear 10/14/2013  . 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 Atrial fibrillation   . Hypertension   . Hyperlipidemia   . Diabetes mellitus   . Osteopenia   . Chronic back pain   . Chest pain     End of Session Activity Tolerance: Patient tolerated treatment well General Behavior During Therapy: [facility] for tasks assessed/performed  GO    Shirlean MylarBethany H. Jayvan Mcshan, OTR/L  10/27/2013, 11:23 AM

## 2013-10-29 ENCOUNTER — Ambulatory Visit (HOSPITAL_COMMUNITY)
Admission: RE | Admit: 2013-10-29 | Discharge: 2013-10-29 | Disposition: A | Discharge status: Home / Self Care | Payer: Medicare Other | Source: Ambulatory Visit | Attending: Family Medicine | Admitting: Family Medicine

## 2013-10-29 DIAGNOSIS — M25519 Pain in unspecified shoulder: Secondary | ICD-10-CM

## 2013-10-29 DIAGNOSIS — M62838 Other muscle spasm: Secondary | ICD-10-CM

## 2013-10-29 NOTE — Progress Notes (Signed)
Occupational Therapy Treatment Patient Details  Name: Shawn Lopez MRN: [id number] Date of Birth: 1941/08/19  Today's Date: 10/29/2013 Time: 7564-3329 OT Time Calculation (min): 47 min Manual therapy [phone] 26' Therapeutic exercises 1005-1026 21'  Visit#: 5 of 18  Re-eval: 11/17/13    Authorization: [employer]  Authorization Time Period: before 10th visit  Authorization Visit#: 5 of 10  Subjective S:  Its been sore, especially at night.  I put down a floor yesterday.  My neck muscles are a little sore.   Pain Assessment Currently in Pain?: Yes Pain Score: 4  Pain Location: Shoulder Pain Orientation: Right Pain Type: Acute pain  Precautions/Restrictions   progress as tolerated  Exercise/Treatments Supine Protraction: PROM;Strengthening;10 reps;Weights Protraction Weight (lbs): 1 Horizontal ABduction: PROM;Strengthening;10 reps;Weights Horizontal ABduction Weight (lbs): 1 External Rotation: PROM;Strengthening;10 reps;Weights External Rotation Weight (lbs): 1 Internal Rotation: PROM;Strengthening;10 reps;Weights Internal Rotation Weight (lbs): 1 Flexion: PROM;Strengthening;10 reps;Weights Shoulder Flexion Weight (lbs): 1 ABduction: PROM;Strengthening;10 reps;Weights Shoulder ABduction Weight (lbs): 1 ROM / Strengthening / Isometric Strengthening UBE (Upper Arm Bike): 3' and 3' 3.0 "W" Arms: standing against wall 10 times  X to V Arms: standing against wall 10 times  Proximal Shoulder Strengthening, Supine: 10 times without resting with 1# weight Ball on Wall: 1' flexed to 90  - increased difficulty with abduction this date therefore stopped .       Manual Therapy Manual Therapy: Myofascial release Myofascial Release: MFR and manual stretching to right upper arm, scapular, and shoulder region to decrease pain and fascial restrictions and improve pain free mobility in his right shoulder region Added manual cervical traction, MFR to SCM (sternocleidomastoid),  upper traps, platysmus to decrease pain and fascial restrictions.   Occupational Therapy Assessment and Plan OT Assessment and Plan Clinical Impression Statement: A:  Patient able to complete strengthening in supine this date wtih good form.  Abduction and flexion between 80 and 120 degrees is painful, although he is able to complete. OT Plan: P:  Increase repetitions with supine strengthening, add overhead lace.    Goals Short Term Goals Short Term Goal 1: Patient will be educated on HEP. Short Term Goal 1 Progress: Met Short Term Goal 2: Patient will improve PROM to [facility] in his right shoulder for increased ability to get comfortable when sleeping. Short Term Goal 2 Progress: Met Short Term Goal 3: Patient will have 4+/5 strength in his right shoulder in order to lift equipment on the job site. Short Term Goal 3 Progress: Progressing toward goal Short Term Goal 4: Patient will decrease pain in his right shoulder to 4/10 when completing daily activities.  Short Term Goal 4 Progress: Met Short Term Goal 5: Patient will decrease fascial restrictions to moderate in his right shoulder region.  Short Term Goal 5 Progress: Met Long Term Goals Long Term Goal 1: Patient will return to prior level of independence will all work, daily, and leisure activities.  Long Term Goal 1 Progress: Progressing toward goal Long Term Goal 2: Patient will improve AROM to WNL in his right shoulder for increased ability to reach overhead at work. Long Term Goal 2 Progress: Progressing toward goal Long Term Goal 3: Patient will have 5/5 strength in his right shoulder in order to lift equipment on the job site. Long Term Goal 3 Progress: Progressing toward goal Long Term Goal 4: Patient will decrease pain in his right shoulder to 2/10 when sleeping at night.  Long Term Goal 4 Progress: Progressing toward goal Long Term Goal 5: Patient  will decrease fascial restrictions to minimal in his right shoulder region.  Long  Term Goal 5 Progress: Progressing toward goal  Problem List Patient Active Problem List   Diagnosis Date Noted  . Pain in joint, shoulder region 10/20/2013  . Muscle spasm of right shoulder 10/20/2013  . Right rotator cuff tear 10/14/2013  . 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 Atrial fibrillation   . Hypertension   . Hyperlipidemia   . Diabetes mellitus   . Osteopenia   . Chronic back pain   . Chest pain     End of Session Activity Tolerance: Patient tolerated treatment well General Behavior During Therapy: [facility] for tasks assessed/performed  GO    Vangie Bicker, OTR/L  10/29/2013, 10:32 AM

## 2013-10-31 ENCOUNTER — Ambulatory Visit (HOSPITAL_COMMUNITY): Payer: Medicare Other | Admitting: Specialist

## 2013-11-03 ENCOUNTER — Ambulatory Visit (HOSPITAL_COMMUNITY)
Admission: RE | Admit: 2013-11-03 | Discharge: 2013-11-03 | Disposition: A | Discharge status: Home / Self Care | Payer: Medicare Other | Source: Ambulatory Visit | Attending: Family Medicine | Admitting: Family Medicine

## 2013-11-03 NOTE — Progress Notes (Signed)
Occupational Therapy Treatment Patient Details  Name: Shawn Lopez MRN: [id number] Date of Birth: [birth year]  Today's Date: 11/03/2013 Time: 7408-1448 OT Time Calculation (min): 45 min  Manual 1018-1038 (20') TherExercises 1856-3149(70')  Visit#: 6 of 18  Re-eval: 11/17/13    Authorization: [facility] Medicare  Authorization Time Period: before 10th visit  Authorization Visit#: 6 of 10  Subjective Symptoms/Limitations Symptoms: S:  It has its good times and bad times.... today is a pretty good time i believe.  Pain Assessment Currently in Pain?: Yes Pain Score: 2  Pain Location: Shoulder Pain Orientation: Right Pain Type: Acute pain   Exercise/Treatments Supine Protraction: PROM;Strengthening;12 reps;Weights Protraction Weight (lbs): 1 Horizontal ABduction: PROM;Strengthening;12 reps;Weights Horizontal ABduction Weight (lbs): 1 External Rotation: PROM;Strengthening;12 reps;Weights External Rotation Weight (lbs): 1 Internal Rotation: PROM;Strengthening;12 reps;Weights Internal Rotation Weight (lbs): 1 Flexion: PROM;Strengthening;12 reps;Weights Shoulder Flexion Weight (lbs): 1 ABduction: PROM;Strengthening;12 reps;Weights Shoulder ABduction Weight (lbs): 1   ROM / Strengthening / Isometric Strengthening UBE (Upper Arm Bike): 3' and 3' 3.0 Over Head Lace: 3' with increased complaint of fatigue and report of lateral/deltoid pain at end of session.  "W" Arms: standing against wall 12 times  X to V Arms: standing against wall 12 times  Proximal Shoulder Strengthening, Supine: 1' total with 1# weights (360" each) Ball on Wall: 1' flexed to 90  - increased difficulty/fatigue  with abduction this date        Manual Therapy Manual Therapy: Myofascial release Myofascial Release: MFR and manual stretching to right upper arm, scapular, and shoulder region to decrease pain and fascial restrictions and improve pain free mobility in his right shoulder region Added manual  cervical traction, MFR to SCM (sternocleidomastoid), upper traps, platysmus to decrease pain and fascial restrictions.   Occupational Therapy Assessment and Plan OT Assessment and Plan Clinical Impression Statement: A:  Patient with increased complaint of fatigue with overhead lacing and ball on wall in abducted plane.  Patient reports going fishing on Saturday and then having increased shoulder pain Sunday arriving this date with increased pain and reported 'catch with popping' in horizontal and regular abduction OT Plan: P:  overhead activities, scapular stability   Goals Short Term Goals Short Term Goal 1: Patient will be educated on HEP. Short Term Goal 1 Progress: Met Short Term Goal 2: Patient will improve PROM to [facility] in his right shoulder for increased ability to get comfortable when sleeping. Short Term Goal 2 Progress: Met Short Term Goal 3: Patient will have 4+/5 strength in his right shoulder in order to lift equipment on the job site. Short Term Goal 3 Progress: Progressing toward goal Short Term Goal 4: Patient will decrease pain in his right shoulder to 4/10 when completing daily activities.  Short Term Goal 4 Progress: Met Short Term Goal 5: Patient will decrease fascial restrictions to moderate in his right shoulder region.  Short Term Goal 5 Progress: Met Long Term Goals Long Term Goal 1: Patient will return to prior level of independence will all work, daily, and leisure activities.  Long Term Goal 1 Progress: Progressing toward goal Long Term Goal 2: Patient will improve AROM to WNL in his right shoulder for increased ability to reach overhead at work. Long Term Goal 2 Progress: Progressing toward goal Long Term Goal 3: Patient will have 5/5 strength in his right shoulder in order to lift equipment on the job site. Long Term Goal 3 Progress: Progressing toward goal Long Term Goal 4: Patient will decrease pain in his right shoulder  to 2/10 when sleeping at night.  Long  Term Goal 4 Progress: Progressing toward goal Long Term Goal 5: Patient will decrease fascial restrictions to minimal in his right shoulder region.  Long Term Goal 5 Progress: Progressing toward goal  Problem List Patient Active Problem List   Diagnosis Date Noted  . Pain in joint, shoulder region 10/20/2013  . Muscle spasm of right shoulder 10/20/2013  . Right rotator cuff tear 10/14/2013  . 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 Atrial fibrillation   . Hypertension   . Hyperlipidemia   . Diabetes mellitus   . Osteopenia   . Chronic back pain   . Chest pain     End of Session Activity Tolerance: Patient tolerated treatment well General Behavior During Therapy: [facility] for tasks assessed/performed  GO    Donney Rankins, OTR/L  11/03/2013, 12:06 PM

## 2013-11-05 ENCOUNTER — Ambulatory Visit (HOSPITAL_COMMUNITY)
Admission: RE | Admit: 2013-11-05 | Discharge: 2013-11-05 | Disposition: A | Discharge status: Home / Self Care | Payer: Medicare Other | Source: Ambulatory Visit | Attending: Family Medicine | Admitting: Family Medicine

## 2013-11-05 DIAGNOSIS — M62838 Other muscle spasm: Secondary | ICD-10-CM

## 2013-11-05 DIAGNOSIS — M25519 Pain in unspecified shoulder: Secondary | ICD-10-CM

## 2013-11-05 NOTE — Progress Notes (Signed)
Occupational Therapy Treatment Patient Details  Name: Raelyn NumberWilliam M Troiani MRN: [id number] Date of Birth: [birth year]  Today's Date: 11/05/2013 Time: 7564-33290928-1013 OT Time Calculation (min): 45 min Manual therapy [phone] 18' Therapeutic exercises [phone] 27' Visit#: 7 of 18  Re-eval: 11/17/13    [facility]  Authorization Time Period: before 10th visit  Authorization Visit#: 7 of 10  Subjective  S:  It can be a little sore.  Im conscious to keep my arms to my side when I am working at home.  Pain Assessment Currently in Pain?: Yes Pain Score: 2  Pain Location: Shoulder Pain Orientation: Right Pain Type: Acute pain  Precautions/Restrictions   progress as tolerated  Exercise/Treatments Supine Protraction: PROM;10 reps;Strengthening;15 reps Protraction Weight (lbs): 1 Horizontal ABduction: PROM;10 reps;Strengthening;15 reps Horizontal ABduction Weight (lbs): 1 External Rotation: PROM;10 reps;Strengthening;15 reps External Rotation Weight (lbs): 1 Internal Rotation: PROM;10 reps;Strengthening;15 reps Internal Rotation Weight (lbs): 1 Flexion: PROM;10 reps;Strengthening;15 reps Shoulder Flexion Weight (lbs): 1 ABduction: PROM;10 reps;Strengthening;15 reps Shoulder ABduction Weight (lbs): 1 Standing Protraction: Strengthening;10 reps;Weights Protraction Weight (lbs): 1 Horizontal ABduction: Strengthening;10 reps;Weights Horizontal ABduction Weight (lbs): 1 External Rotation: Strengthening;10 reps;Weights External Rotation Weight (lbs): 1 Internal Rotation: Strengthening;10 reps;Weights Internal Rotation Weight (lbs): 1 ABduction:  (attempted, unable due to subscap pain) ROM / Strengthening / Isometric Strengthening UBE (Upper Arm Bike): 3' and 3' 2.0 Wall Wash: 1'45", goal was 2' Proximal Shoulder Strengthening, Supine: 10 times with 1 pound, no rest Ball on Wall: 1' flexed did not complete abducted as patient is having increased pain with abduction this  date.       Manual Therapy Manual Therapy: Myofascial release Myofascial Release: MFR and manual stretching to right upper arm, scapular, and shoulder region to decrease pain and fascial restrictions and improve pain free mobility in his right shoulder region  Occupational Therapy Assessment and Plan OT Assessment and Plan Clinical Impression Statement: A:  Added standing strengthening, Patient having difficulty with abduction exercises and increased pain with range after audible popping heard with abduction in posterior shoulder near subscapularis insertion.  OT Plan: P:  Attempt full AROM of abduction and concentrate on overhead activities and scapular stability.    Goals Short Term Goals Short Term Goal 1: Patient will be educated on HEP. Short Term Goal 2: Patient will [facility] to [facility] in his right shoulder for increased ability to get comfortable when sleeping. Short Term Goal 3: Patient will have 4+/5 strength in his right shoulder in order to lift equipment on the job site. Short Term Goal 4: Patient will decrease pain in his right shoulder to 4/10 when completing daily activities.  Short Term Goal 5: Patient will decrease fascial restrictions to moderate in his right shoulder region.  Long Term Goals Long Term Goal 1: Patient will return to prior level of independence will all work, daily, and leisure activities.  Long Term Goal 2: Patient will improve AROM to WNL in his right shoulder for increased ability to reach overhead at work. Long Term Goal 3: Patient will have 5/5 strength in his right shoulder in order to lift equipment on the job site. Long Term Goal 4: Patient will decrease pain in his right shoulder to 2/10 when sleeping at night.  Long Term Goal 5: Patient will decrease fascial restrictions to minimal in his right shoulder region.   Problem List Patient Active Problem List   Diagnosis Date Noted  . Pain in joint, shoulder region 10/20/2013  . Muscle spasm of  right shoulder 10/20/2013  .  Right rotator cuff tear 10/14/2013  . 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 Atrial fibrillation   . Hypertension   . Hyperlipidemia   . Diabetes mellitus   . Osteopenia   . Chronic back pain   . Chest pain     End of Session Activity Tolerance: Patient tolerated treatment well General Behavior During Therapy: [facility] for tasks assessed/performed  GO    Shirlean Mylar, OTR/L  11/05/2013, 10:12 AM

## 2013-11-07 ENCOUNTER — Ambulatory Visit (HOSPITAL_COMMUNITY)
Admission: RE | Admit: 2013-11-07 | Discharge: 2013-11-07 | Disposition: A | Discharge status: Home / Self Care | Payer: Medicare Other | Source: Ambulatory Visit | Attending: Family Medicine | Admitting: Family Medicine

## 2013-11-07 DIAGNOSIS — M62838 Other muscle spasm: Secondary | ICD-10-CM

## 2013-11-07 DIAGNOSIS — M25519 Pain in unspecified shoulder: Secondary | ICD-10-CM

## 2013-11-07 NOTE — Progress Notes (Signed)
Occupational Therapy Treatment Patient Details  Name: Shawn Lopez MRN: [id number] Date of Birth: May 15, 1941  Today's Date: 11/07/2013 Time: [id number] OT Time Calculation (min): 36 min Manual therapy [phone] 15' Therapeutic exercises [phone] 21'  Visit#: 8 of 18  Re-eval: 11/17/13    Authorization: [employer] Medicare  Authorization Time Period: before 10th visit  Authorization Visit#: 8 of 10  Subjective  S:  It was sore after I left here.  Later in the day I could really tell a difference.  I think this is really helping.  Pain Assessment Currently in Pain?: Yes Pain Score: 2  Pain Location: Shoulder Pain Orientation: Right Pain Type: Acute pain  Precautions/Restrictions   progress as tolerated  Exercise/Treatments Supine Protraction: PROM;10 reps;Strengthening;15 reps;Limitations Protraction Weight (lbs): 1 Horizontal ABduction: PROM;10 reps;Strengthening;15 reps Horizontal ABduction Weight (lbs): 1 External Rotation: PROM;10 reps;Strengthening;15 reps External Rotation Weight (lbs): 1 Internal Rotation: PROM;10 reps;Strengthening;15 reps Internal Rotation Weight (lbs): 1 Flexion: PROM;10 reps;Strengthening;15 reps Shoulder Flexion Weight (lbs): 1 ABduction: PROM;10 reps;Strengthening;15 reps;Limitations Shoulder ABduction Weight (lbs): 1 ABduction Limitations: scaption vs abduction Standing Protraction: Strengthening;10 reps;Weights Protraction Weight (lbs): 1 Horizontal ABduction: Strengthening;10 reps;Weights Horizontal ABduction Weight (lbs): 1 External Rotation: Strengthening;10 reps;Weights Theraband Level (Shoulder External Rotation): Level 2 (Red) External Rotation Weight (lbs): 1 Internal Rotation: Strengthening;10 reps;Weights Theraband Level (Shoulder Internal Rotation): Level 2 (Red) Internal Rotation Weight (lbs): 1 Flexion: Strengthening;10 reps;Weights Shoulder Flexion Weight (lbs): 1 ABduction: Strengthening;10  reps;Weights;Limitations Shoulder ABduction Weight (lbs): 1 ABduction Limitations: scaption vs abduction Extension: Theraband;15 reps Theraband Level (Shoulder Extension): Level 2 (Red) Row: Theraband;15 reps Theraband Level (Shoulder Row): Level 2 (Red) Retraction: Theraband;15 reps Theraband Level (Shoulder Retraction): Level 2 (Red) ROM / Strengthening / Isometric Strengthening Cybex Press: 1 plate;10 reps Cybex Row: 1 plate;10 reps Wall Wash: 2' (improved from 1'45") Proximal Shoulder Strengthening, Supine: 10 times with 1 pound, no rest Proximal Shoulder Strengthening, Seated: 10 times each, no rest, no weights      Manual Therapy Manual Therapy: Myofascial release Myofascial Release: MFR and manual stretching to right upper arm, scapular, and shoulder region to decrease pain and fascial restrictions and improve pain free mobility in his right shoulder region  Occupational Therapy Assessment and Plan OT Assessment and Plan Clinical Impression Statement: A:  Completed scaption rather than abduction, as abduction in supine and standing can be quite painful for patient. Added cybex press and row.  Able to tolerate wall wash for 2 minutes this date.  OT Plan: P:  Attempt 2 pounds strengthening   Goals Short Term Goals Short Term Goal 1: Patient will be educated on HEP. Short Term Goal 2: Patient will improve PROM to [facility] in his right shoulder for increased ability to get comfortable when sleeping. Short Term Goal 3: Patient will have 4+/5 strength in his right shoulder in order to lift equipment on the job site. Short Term Goal 4: Patient will decrease pain in his right shoulder to 4/10 when completing daily activities.  Short Term Goal 5: Patient will decrease fascial restrictions to moderate in his right shoulder region.  Long Term Goals Long Term Goal 1: Patient will return to prior level of independence will all work, daily, and leisure activities.  Long Term Goal 2: Patient  will improve AROM to WNL in his right shoulder for increased ability to reach overhead at work. Long Term Goal 3: Patient will have 5/5 strength in his right shoulder in order to lift equipment on the job site. Long Term Goal 4: Patient will  decrease pain in his right shoulder to 2/10 when sleeping at night.  Long Term Goal 5: Patient will decrease fascial restrictions to minimal in his right shoulder region.   Problem List Patient Active Problem List   Diagnosis Date Noted  . Pain in joint, shoulder region 10/20/2013  . Muscle spasm of right shoulder 10/20/2013  . Right rotator cuff tear 10/14/2013  . 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 Atrial fibrillation   . Hypertension   . Hyperlipidemia   . Diabetes mellitus   . Osteopenia   . Chronic back pain   . Chest pain     End of Session Activity Tolerance: Patient tolerated treatment well General Behavior During Therapy: [facility] for tasks assessed/performed  GO    Shirlean Mylar, OTR/L  11/07/2013, 11:31 AM

## 2013-11-10 ENCOUNTER — Inpatient Hospital Stay (HOSPITAL_COMMUNITY): Admission: RE | Admit: 2013-11-10 | Payer: Medicare Other | Source: Ambulatory Visit

## 2013-11-12 ENCOUNTER — Telehealth (HOSPITAL_COMMUNITY): Payer: Self-pay

## 2013-11-12 ENCOUNTER — Ambulatory Visit (HOSPITAL_COMMUNITY): Payer: Medicare Other | Admitting: Specialist

## 2013-11-14 ENCOUNTER — Ambulatory Visit (HOSPITAL_COMMUNITY): Payer: Medicare Other | Admitting: Specialist

## 2013-11-17 ENCOUNTER — Ambulatory Visit (HOSPITAL_COMMUNITY): Payer: Medicare Other | Admitting: Specialist

## 2013-11-19 ENCOUNTER — Inpatient Hospital Stay (HOSPITAL_COMMUNITY): Admission: RE | Admit: 2013-11-19 | Payer: Medicare Other | Source: Ambulatory Visit | Admitting: Specialist

## 2013-11-21 ENCOUNTER — Ambulatory Visit (HOSPITAL_COMMUNITY): Payer: Medicare Other | Admitting: Specialist

## 2013-12-16 ENCOUNTER — Ambulatory Visit (INDEPENDENT_AMBULATORY_CARE_PROVIDER_SITE_OTHER): Payer: Medicare Other | Admitting: Orthopedic Surgery

## 2013-12-16 ENCOUNTER — Encounter: Payer: Self-pay | Admitting: Orthopedic Surgery

## 2013-12-16 VITALS — BP 153/88 | Ht 71.0 in | Wt 197.0 lb

## 2013-12-16 DIAGNOSIS — S43429A Sprain of unspecified rotator cuff capsule, initial encounter: Secondary | ICD-10-CM

## 2013-12-16 DIAGNOSIS — M25511 Pain in right shoulder: Secondary | ICD-10-CM

## 2013-12-16 DIAGNOSIS — M75101 Unspecified rotator cuff tear or rupture of right shoulder, not specified as traumatic: Secondary | ICD-10-CM

## 2013-12-16 DIAGNOSIS — M25519 Pain in unspecified shoulder: Secondary | ICD-10-CM

## 2013-12-16 NOTE — Patient Instructions (Signed)

## 2013-12-16 NOTE — Progress Notes (Signed)
Patient ID: Shawn Lopez, male   DOB: 02-Sep-1941, 73 y.o.   MRN: [id number] Followup visit Chief Complaint  Patient presents with  . Follow-up    2 month recheck right shoulder s/p therapy    Status post physical therapy for right shoulder presumed rotator cuff tear versus rotator cuff syndrome and right shoulder pain however, still complains of catching. He has excellent forward elevation mild weakness  Denies numbness or tingling denies neck pain  Full forward elevation of the right shoulder with good strength right to left when compared shoulder is stable in abduction external rotation skin intact no axillary lymph nodes pulse and perfusion are normal sensations intact no specific tenderness but catching and crepitus  BP 153/88  Ht 5\' 11"  (1.803 m)  Wt 197 lb (89.359 kg)  BMI 27.49 kg/m2  Rotator cuff syndrome possible tear recommend subacromial injection followup as needed  Procedure inject subacromial space right shoulder Diagnosis rotator cuff syndrome right shoulder Medication Depo-Medrol 40 mg, 1 cc and lidocaine 1% 3 cc Verbal consent Timeout completed  The injection site was cleaned with alcohol and sprayed with ethyl chloride. From a posterior approach a 20-gauge needle was injected in the subacromial space. The medication went in easily. There were no complications. The wound was covered with a sterile bandage. Appropriate precautions were given.

## 2014-01-02 ENCOUNTER — Other Ambulatory Visit (HOSPITAL_COMMUNITY): Payer: Self-pay | Admitting: Family Medicine

## 2014-01-02 ENCOUNTER — Ambulatory Visit (HOSPITAL_COMMUNITY)
Admission: RE | Admit: 2014-01-02 | Discharge: 2014-01-02 | Disposition: A | Discharge status: Home / Self Care | Payer: Medicare Other | Source: Ambulatory Visit | Attending: Family Medicine | Admitting: Family Medicine

## 2014-01-02 DIAGNOSIS — M545 Low back pain, unspecified: Secondary | ICD-10-CM | POA: Insufficient documentation

## 2014-01-02 DIAGNOSIS — M51379 Other intervertebral disc degeneration, lumbosacral region without mention of lumbar back pain or lower extremity pain: Secondary | ICD-10-CM | POA: Insufficient documentation

## 2014-01-02 DIAGNOSIS — M439 Deforming dorsopathy, unspecified: Secondary | ICD-10-CM | POA: Insufficient documentation

## 2014-01-02 DIAGNOSIS — M5137 Other intervertebral disc degeneration, lumbosacral region: Secondary | ICD-10-CM | POA: Insufficient documentation

## 2014-10-09 ENCOUNTER — Ambulatory Visit (HOSPITAL_COMMUNITY)
Admission: RE | Admit: 2014-10-09 | Discharge: 2014-10-09 | Disposition: A | Discharge status: Home / Self Care | Payer: Commercial Managed Care - HMO | Source: Ambulatory Visit | Attending: Family Medicine | Admitting: Family Medicine

## 2014-10-09 ENCOUNTER — Other Ambulatory Visit (HOSPITAL_COMMUNITY): Payer: Self-pay | Admitting: Family Medicine

## 2014-10-09 DIAGNOSIS — N529 Male erectile dysfunction, unspecified: Secondary | ICD-10-CM | POA: Diagnosis not present

## 2014-10-09 DIAGNOSIS — S22000D Wedge compression fracture of unspecified thoracic vertebra, subsequent encounter for fracture with routine healing: Secondary | ICD-10-CM | POA: Diagnosis not present

## 2014-10-09 DIAGNOSIS — S32000D Wedge compression fracture of unspecified lumbar vertebra, subsequent encounter for fracture with routine healing: Secondary | ICD-10-CM | POA: Diagnosis not present

## 2014-10-09 DIAGNOSIS — M5146 Schmorl's nodes, lumbar region: Secondary | ICD-10-CM

## 2014-10-09 DIAGNOSIS — M47816 Spondylosis without myelopathy or radiculopathy, lumbar region: Secondary | ICD-10-CM | POA: Diagnosis not present

## 2014-10-09 DIAGNOSIS — N486 Induration penis plastica: Secondary | ICD-10-CM | POA: Diagnosis not present

## 2014-10-09 DIAGNOSIS — M5137 Other intervertebral disc degeneration, lumbosacral region: Secondary | ICD-10-CM | POA: Diagnosis not present

## 2014-10-09 DIAGNOSIS — M545 Low back pain: Secondary | ICD-10-CM | POA: Insufficient documentation

## 2014-10-09 DIAGNOSIS — M4855XS Collapsed vertebra, not elsewhere classified, thoracolumbar region, sequela of fracture: Secondary | ICD-10-CM | POA: Insufficient documentation

## 2014-10-09 DIAGNOSIS — E1142 Type 2 diabetes mellitus with diabetic polyneuropathy: Secondary | ICD-10-CM | POA: Diagnosis not present

## 2014-12-11 DIAGNOSIS — S32010D Wedge compression fracture of first lumbar vertebra, subsequent encounter for fracture with routine healing: Secondary | ICD-10-CM | POA: Diagnosis not present

## 2014-12-11 DIAGNOSIS — Z79891 Long term (current) use of opiate analgesic: Secondary | ICD-10-CM | POA: Diagnosis not present

## 2014-12-11 DIAGNOSIS — M545 Low back pain: Secondary | ICD-10-CM | POA: Diagnosis not present

## 2014-12-11 DIAGNOSIS — M542 Cervicalgia: Secondary | ICD-10-CM | POA: Diagnosis not present

## 2015-01-26 DIAGNOSIS — J441 Chronic obstructive pulmonary disease with (acute) exacerbation: Secondary | ICD-10-CM | POA: Diagnosis not present

## 2015-01-29 DIAGNOSIS — J441 Chronic obstructive pulmonary disease with (acute) exacerbation: Secondary | ICD-10-CM | POA: Diagnosis not present

## 2015-02-10 DIAGNOSIS — E1142 Type 2 diabetes mellitus with diabetic polyneuropathy: Secondary | ICD-10-CM | POA: Diagnosis not present

## 2015-02-10 DIAGNOSIS — J209 Acute bronchitis, unspecified: Secondary | ICD-10-CM | POA: Diagnosis not present

## 2015-03-12 DIAGNOSIS — E782 Mixed hyperlipidemia: Secondary | ICD-10-CM | POA: Diagnosis not present

## 2015-03-12 DIAGNOSIS — I10 Essential (primary) hypertension: Secondary | ICD-10-CM | POA: Diagnosis not present

## 2015-03-12 DIAGNOSIS — M25511 Pain in right shoulder: Secondary | ICD-10-CM | POA: Diagnosis not present

## 2015-03-12 DIAGNOSIS — E1142 Type 2 diabetes mellitus with diabetic polyneuropathy: Secondary | ICD-10-CM | POA: Diagnosis not present

## 2015-05-18 DIAGNOSIS — E1142 Type 2 diabetes mellitus with diabetic polyneuropathy: Secondary | ICD-10-CM | POA: Diagnosis not present

## 2015-05-24 DIAGNOSIS — E1142 Type 2 diabetes mellitus with diabetic polyneuropathy: Secondary | ICD-10-CM | POA: Diagnosis not present

## 2015-05-24 DIAGNOSIS — Z79891 Long term (current) use of opiate analgesic: Secondary | ICD-10-CM | POA: Diagnosis not present

## 2015-05-24 DIAGNOSIS — M545 Low back pain: Secondary | ICD-10-CM | POA: Diagnosis not present

## 2015-05-24 DIAGNOSIS — I10 Essential (primary) hypertension: Secondary | ICD-10-CM | POA: Diagnosis not present

## 2015-08-18 DIAGNOSIS — M542 Cervicalgia: Secondary | ICD-10-CM | POA: Diagnosis not present

## 2015-08-18 DIAGNOSIS — Z79891 Long term (current) use of opiate analgesic: Secondary | ICD-10-CM | POA: Diagnosis not present

## 2015-08-18 DIAGNOSIS — E1142 Type 2 diabetes mellitus with diabetic polyneuropathy: Secondary | ICD-10-CM | POA: Diagnosis not present

## 2015-08-18 DIAGNOSIS — I10 Essential (primary) hypertension: Secondary | ICD-10-CM | POA: Diagnosis not present

## 2015-09-16 DIAGNOSIS — H524 Presbyopia: Secondary | ICD-10-CM | POA: Diagnosis not present

## 2015-09-16 DIAGNOSIS — H5210 Myopia, unspecified eye: Secondary | ICD-10-CM | POA: Diagnosis not present

## 2016-12-29 ENCOUNTER — Other Ambulatory Visit (HOSPITAL_COMMUNITY): Payer: Self-pay | Admitting: Family Medicine

## 2016-12-29 DIAGNOSIS — M545 Low back pain: Secondary | ICD-10-CM

## 2017-01-05 ENCOUNTER — Other Ambulatory Visit (HOSPITAL_COMMUNITY): Payer: Self-pay | Admitting: Family Medicine

## 2017-01-05 ENCOUNTER — Ambulatory Visit (HOSPITAL_COMMUNITY)
Admission: RE | Admit: 2017-01-05 | Discharge: 2017-01-05 | Disposition: A | Discharge status: Home / Self Care | Payer: Commercial Managed Care - HMO | Source: Ambulatory Visit | Attending: Family Medicine | Admitting: Family Medicine

## 2017-01-05 ENCOUNTER — Ambulatory Visit (HOSPITAL_COMMUNITY)
Admission: RE | Admit: 2017-01-05 | Discharge: 2017-01-05 | Disposition: A | Discharge status: Home / Self Care | Payer: Medicare HMO | Source: Ambulatory Visit | Attending: Family Medicine | Admitting: Family Medicine

## 2017-01-05 DIAGNOSIS — R52 Pain, unspecified: Secondary | ICD-10-CM

## 2017-01-05 DIAGNOSIS — M545 Low back pain: Secondary | ICD-10-CM | POA: Insufficient documentation

## 2017-01-13 ENCOUNTER — Emergency Department (HOSPITAL_COMMUNITY): Payer: Medicare HMO

## 2017-01-13 ENCOUNTER — Encounter (HOSPITAL_COMMUNITY): Payer: Self-pay | Admitting: Emergency Medicine

## 2017-01-13 ENCOUNTER — Inpatient Hospital Stay (HOSPITAL_COMMUNITY)
Admission: EM | Admit: 2017-01-13 | Discharge: 2017-01-16 | DRG: 246 | Disposition: A | Discharge status: Home / Self Care | Payer: Medicare HMO | Attending: Internal Medicine | Admitting: Internal Medicine

## 2017-01-13 DIAGNOSIS — E118 Type 2 diabetes mellitus with unspecified complications: Secondary | ICD-10-CM

## 2017-01-13 DIAGNOSIS — I25110 Atherosclerotic heart disease of native coronary artery with unstable angina pectoris: Secondary | ICD-10-CM | POA: Diagnosis present

## 2017-01-13 DIAGNOSIS — Z8249 Family history of ischemic heart disease and other diseases of the circulatory system: Secondary | ICD-10-CM

## 2017-01-13 DIAGNOSIS — Z87891 Personal history of nicotine dependence: Secondary | ICD-10-CM

## 2017-01-13 DIAGNOSIS — I10 Essential (primary) hypertension: Secondary | ICD-10-CM | POA: Diagnosis present

## 2017-01-13 DIAGNOSIS — E1169 Type 2 diabetes mellitus with other specified complication: Secondary | ICD-10-CM

## 2017-01-13 DIAGNOSIS — E119 Type 2 diabetes mellitus without complications: Secondary | ICD-10-CM | POA: Diagnosis present

## 2017-01-13 DIAGNOSIS — Z7984 Long term (current) use of oral hypoglycemic drugs: Secondary | ICD-10-CM

## 2017-01-13 DIAGNOSIS — E785 Hyperlipidemia, unspecified: Secondary | ICD-10-CM | POA: Diagnosis not present

## 2017-01-13 DIAGNOSIS — R072 Precordial pain: Secondary | ICD-10-CM | POA: Diagnosis not present

## 2017-01-13 DIAGNOSIS — R002 Palpitations: Secondary | ICD-10-CM | POA: Diagnosis present

## 2017-01-13 DIAGNOSIS — R001 Bradycardia, unspecified: Secondary | ICD-10-CM | POA: Diagnosis present

## 2017-01-13 DIAGNOSIS — Z955 Presence of coronary angioplasty implant and graft: Secondary | ICD-10-CM

## 2017-01-13 DIAGNOSIS — I214 Non-ST elevation (NSTEMI) myocardial infarction: Secondary | ICD-10-CM | POA: Diagnosis not present

## 2017-01-13 DIAGNOSIS — R079 Chest pain, unspecified: Secondary | ICD-10-CM | POA: Diagnosis present

## 2017-01-13 HISTORY — DX: Essential (primary) hypertension: I10

## 2017-01-13 LAB — BASIC METABOLIC PANEL
Anion gap: 10 (ref 5–15)
BUN: 17 mg/dL (ref 6–20)
CALCIUM: 9.2 mg/dL (ref 8.9–10.3)
CHLORIDE: 101 mmol/L (ref 101–111)
CO2: 24 mmol/L (ref 22–32)
CREATININE: 0.8 mg/dL (ref 0.61–1.24)
GFR calc non Af Amer: >60 mL/min (ref >60)
GLUCOSE: 166 mg/dL — AB (ref 65–99)
Potassium: 4 mmol/L (ref 3.5–5.1)
Sodium: 135 mmol/L (ref 135–145)

## 2017-01-13 LAB — CBC WITH DIFFERENTIAL/PLATELET
BASOS PCT: 1 %
Basophils Absolute: 0 10*3/uL (ref 0.0–0.1)
EOS ABS: 0.3 10*3/uL (ref 0.0–0.7)
Eosinophils Relative: 5 %
HCT: 44.2 % (ref 39.0–52.0)
HEMOGLOBIN: 14.7 g/dL (ref 13.0–17.0)
LYMPHS ABS: 1.6 10*3/uL (ref 0.7–4.0)
Lymphocytes Relative: 22 %
MCH: 30.1 pg (ref 26.0–34.0)
MCHC: 33.3 g/dL (ref 30.0–36.0)
MCV: 90.6 fL (ref 78.0–100.0)
MONO ABS: 0.6 10*3/uL (ref 0.1–1.0)
MONOS PCT: 8 %
NEUTROS ABS: 4.6 10*3/uL (ref 1.7–7.7)
NEUTROS PCT: 64 %
PLATELETS: 172 10*3/uL (ref 150–400)
RBC: 4.88 MIL/uL (ref 4.22–5.81)
RDW: 13.2 % (ref 11.5–15.5)
WBC: 7.1 10*3/uL (ref 4.0–10.5)

## 2017-01-13 LAB — I-STAT TROPONIN, ED: TROPONIN I, POC: 0 ng/mL (ref 0.00–0.08)

## 2017-01-13 LAB — GLUCOSE, CAPILLARY
GLUCOSE-CAPILLARY: 195 mg/dL — AB (ref 65–99)
GLUCOSE-CAPILLARY: 122 mg/dL — AB (ref 65–99)
Glucose-Capillary: 130 mg/dL — ABNORMAL HIGH (ref 65–99)

## 2017-01-13 LAB — TROPONIN I
TROPONIN I: 0.37 ng/mL — AB (ref <0.03)
Troponin I: 0.33 ng/mL (ref <0.03)

## 2017-01-13 MED ORDER — NITROGLYCERIN 0.4 MG SL SUBL
0.4000 mg | SUBLINGUAL_TABLET | Freq: Once | SUBLINGUAL | Status: AC
  Administered 2017-01-13: 0.4 mg via SUBLINGUAL
  Filled 2017-01-13: qty 1

## 2017-01-13 MED ORDER — METOPROLOL TARTRATE 25 MG PO TABS
25.0000 mg | ORAL_TABLET | Freq: Two times a day (BID) | ORAL | Status: DC
  Administered 2017-01-13: 25 mg via ORAL
  Filled 2017-01-13: qty 1

## 2017-01-13 MED ORDER — ATORVASTATIN CALCIUM 80 MG PO TABS
80.0000 mg | ORAL_TABLET | Freq: Every day | ORAL | Status: DC
  Administered 2017-01-13 – 2017-01-15 (×3): 80 mg via ORAL
  Filled 2017-01-13 (×2): qty 1
  Filled 2017-01-13: qty 2

## 2017-01-13 MED ORDER — METFORMIN HCL 500 MG PO TABS
1000.0000 mg | ORAL_TABLET | Freq: Two times a day (BID) | ORAL | Status: DC
  Administered 2017-01-13: 1000 mg via ORAL
  Filled 2017-01-13: qty 2

## 2017-01-13 MED ORDER — INSULIN ASPART 100 UNIT/ML ~~LOC~~ SOLN
0.0000 [IU] | Freq: Three times a day (TID) | SUBCUTANEOUS | Status: DC
Start: 2017-01-13 — End: 2017-01-16
  Administered 2017-01-13 – 2017-01-14 (×2): 2 [IU] via SUBCUTANEOUS
  Administered 2017-01-14 – 2017-01-15 (×2): 1 [IU] via SUBCUTANEOUS
  Administered 2017-01-16: 2 [IU] via SUBCUTANEOUS

## 2017-01-13 MED ORDER — NITROGLYCERIN 0.4 MG SL SUBL: 0.4000 mg | SUBLINGUAL_TABLET | SUBLINGUAL | Status: DC | PRN

## 2017-01-13 MED ORDER — ENOXAPARIN SODIUM 40 MG/0.4ML ~~LOC~~ SOLN
40.0000 mg | SUBCUTANEOUS | Status: DC
  Administered 2017-01-13: 40 mg via SUBCUTANEOUS
  Filled 2017-01-13: qty 0.4

## 2017-01-13 MED ORDER — ASPIRIN 81 MG PO CHEW
243.0000 mg | CHEWABLE_TABLET | Freq: Once | ORAL | Status: AC
  Administered 2017-01-13: 243 mg via ORAL
  Filled 2017-01-13: qty 3

## 2017-01-13 MED ORDER — ACETAMINOPHEN 325 MG PO TABS
650.0000 mg | ORAL_TABLET | ORAL | Status: DC | PRN
  Administered 2017-01-13: 650 mg via ORAL
  Filled 2017-01-13: qty 2

## 2017-01-13 MED ORDER — HEPARIN BOLUS VIA INFUSION
3000.0000 [IU] | Freq: Once | INTRAVENOUS | Status: AC
  Administered 2017-01-13: 3000 [IU] via INTRAVENOUS
  Filled 2017-01-13: qty 3000

## 2017-01-13 MED ORDER — ONDANSETRON HCL 4 MG/2ML IJ SOLN
4.0000 mg | Freq: Four times a day (QID) | INTRAMUSCULAR | Status: DC | PRN
Start: 2017-01-13 — End: 2017-01-16
  Administered 2017-01-15: 4 mg via INTRAVENOUS
  Filled 2017-01-13: qty 2

## 2017-01-13 MED ORDER — HEPARIN (PORCINE) IN NACL 100-0.45 UNIT/ML-% IJ SOLN
INTRAMUSCULAR | Status: AC
  Filled 2017-01-13: qty 250

## 2017-01-13 MED ORDER — ASPIRIN EC 325 MG PO TBEC: 325.0000 mg | DELAYED_RELEASE_TABLET | Freq: Every day | ORAL | Status: DC

## 2017-01-13 MED ORDER — LISINOPRIL 5 MG PO TABS
2.5000 mg | ORAL_TABLET | Freq: Every day | ORAL | Status: DC
  Administered 2017-01-14 – 2017-01-15 (×2): 2.5 mg via ORAL
  Filled 2017-01-13 (×4): qty 1

## 2017-01-13 MED ORDER — HEPARIN (PORCINE) IN NACL 100-0.45 UNIT/ML-% IJ SOLN
1100.0000 [IU]/h | INTRAMUSCULAR | Status: DC
  Administered 2017-01-13 – 2017-01-14 (×2): 1100 [IU]/h via INTRAVENOUS
  Filled 2017-01-13 (×2): qty 250

## 2017-01-13 MED ORDER — INSULIN ASPART 100 UNIT/ML ~~LOC~~ SOLN: 0.0000 [IU] | Freq: Every day | SUBCUTANEOUS | Status: DC

## 2017-01-13 MED ORDER — PRAVASTATIN SODIUM 40 MG PO TABS: 40.0000 mg | ORAL_TABLET | Freq: Two times a day (BID) | ORAL | Status: DC

## 2017-01-13 NOTE — Progress Notes (Signed)
ANTICOAGULATION CONSULT NOTE - Initial Consult  Pharmacy Consult for HEPARIN Indication: chest pain/ACS  No Known Allergies  Patient Measurements: Height:  (180.3 cm) Weight: 205 lb (93 kg) IBW/kg (Calculated) : 75.3  HEPARIN DW (KG): 93  Vital Signs: Temp: 97.5 F (36.4 C) (04/21 0904) Temp Source: Oral (04/21 0904) BP: 113/59 (04/21 1130) Pulse Rate: 47 (04/21 1130)  Labs:  Recent Labs  01/13/17 0954 01/13/17 1551  HGB 14.7  --   HCT 44.2  --   PLT 172  --   CREATININE 0.80  --   TROPONINI  --  0.33*    Estimated Creatinine Clearance: 93 mL/min (by C-G formula based on SCr of 0.8 mg/dL).   Medical History: Past Medical History:  Diagnosis Date  . Arthritis   . Atrial fibrillation (HCC) 2006   Echocardiogram in 2006-mild LVH with normal EF  . Borderline hypertension   . Chest pain 2001   negative stress nuclear in 2006; negative coronary angiography(no documentation); essentially normal PFTs in 07/2004; CT in 2012-lipomatous hypertrophy; mild atherosclerosis of the aorta and coronaries; mild dependent pulmonary scarring or atelectasis; small area of focal pleural thickening; chronic bronchitic changes on chest x-ray  . Chronic back pain    Prior trauma with vertebral fracture  . Diabetes mellitus   . Diabetes mellitus    CBGs 150-170 in 2011; 2112- treated with single oral agent  . Hyperlipidemia   . Hypertension   . Osteopenia    by bone densitometry  . 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 doxycycline (VIBRA-TABS) 100 MG tablet Take 1 tablet by mouth 2 (two) times daily. For 15 days, starting on 01/02/2017   01/12/2017 at Unknown time  . lisinopril (PRINIVIL,ZESTRIL) 2.5 MG tablet Take 1 tablet by mouth daily.   01/13/2017 at Unknown time  . metFORMIN (GLUCOPHAGE) 500 MG tablet Take 1,000 mg by mouth 2 (two) times daily.    01/12/2017 at Unknown time  .  metoprolol tartrate (LOPRESSOR) 25 MG tablet Take 25 mg by mouth 2 (two) times daily.     01/13/2017 at 0830  . pravastatin (PRAVACHOL) 40 MG tablet Take 40 mg by mouth 2 (two) times daily.    01/12/2017 at Unknown time   Assessment: 76yo male c/o left arm and chest pain.  Asked to initiate Heparin for ACS.  Pt received Lovenox  SQ at 17:15 today.  Goal of Therapy:  Heparin level 0.3-0.7 units/ml Monitor platelets by anticoagulation protocol: Yes   Plan:   Heparin 3000 units IV now x 1  Heparin infusion at 1100 units/hr  Heparin level in 6-8 hrs then daily  CBC daily while on Heparin  Valrie Hart A 01/13/2017,6:16 PM

## 2017-01-13 NOTE — ED Provider Notes (Signed)
Emergency Department Provider Note  By signing my name below, I, Bing Neighbors., attest that this documentation has been prepared under the direction and in the presence of [facility], DO. Electronically signed: Bing Neighbors., ED Scribe. 01/14/17. 10:10 AM.  I have reviewed the triage vital signs and the nursing notes.   HISTORY  Chief Complaint Chest Pain   HPI  Shawn Lopez is a 76 y.o. male with hx of diabetes mellitus, A-fib, Hyperlipidemia, HTN who presents to the Emergency Department complaining of waxing and waning chest pain with onset x1 hour. Pt states that he developed an aching pain that radiates down the L arm when he awoke this morning. He also reports a burning chest pain that has been worsening since this time. Pt has taken aspirin with no relief. He denies rash, cough, fever, abdominal pain. Pt denies any hx of cardiac complications, stroke hx. Per wife, pt is on a course of antibiotics for a UTI.     Past Medical History:  Diagnosis Date  . Arthritis   . Atrial fibrillation (HCC) 2006   Echocardiogram in 2006-mild LVH with normal EF  . Borderline hypertension   . Chest pain 2001   negative stress nuclear in 2006; negative coronary angiography(no documentation); essentially normal PFTs in 07/2004; CT in 2012-lipomatous hypertrophy; mild atherosclerosis of the aorta and coronaries; mild dependent pulmonary scarring or atelectasis; small area of focal pleural thickening; chronic bronchitic changes on chest x-ray  . Chronic back pain    Prior trauma with vertebral fracture  . Diabetes mellitus   . Diabetes mellitus    CBGs 150-170 in 2011; 2112- treated with single oral agent  . Hyperlipidemia   . Hypertension   . Osteopenia    by bone densitometry  . 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 Chest pain 01/13/2017  . NSTEMI (non-ST elevated myocardial infarction) (HCC)  01/13/2017  . Pain in joint, shoulder region 10/20/2013  . Muscle spasm of right shoulder 10/20/2013  . Right rotator cuff tear 10/14/2013  . 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 Atrial fibrillation (HCC)   . Hypertension   . Hyperlipidemia   . Diabetes mellitus (HCC)   . Osteopenia   . Chronic back pain   . Chest pain     Past Surgical History:  Procedure Laterality Date  . APPENDECTOMY  1960  . CATARACT EXTRACTION  2012   right eye,lens implantation  . CHOLECYSTECTOMY    . CIRCUMCISION  2010   meatotomy and dilatation  . COLONOSCOPY N/A 01/14/2013   Procedure: COLONOSCOPY;  Surgeon: Dalia Heading, MD;  Location: [facility];  Service: Gastroenterology;  Laterality: N/A;      Allergies Patient has no known allergies.  Family History  Problem Relation Age of Onset  . CAD Father   . Heart attack Brother     Social History Social History  Substance Use Topics  . Smoking status: Former Smoker    Packs/day: 1.50    Years: 15.00    Types: Cigarettes    Quit date: 04/20/1997  . Smokeless tobacco: Never Used  . Alcohol use No    Review of Systems  Constitutional: No fever/chills Eyes: No visual changes. ENT: No sore throat. Cardiovascular: + chest pain. Respiratory: Denies shortness of breath. Gastrointestinal: No abdominal pain.  No nausea, no vomiting.  No diarrhea.  No constipation. Genitourinary: Negative for dysuria. Musculoskeletal: +  L arm pain, Negative for back pain. Skin: Negative for rash. Neurological: Negative for headaches, focal weakness or numbness.  10-point ROS otherwise negative.  ____________________________________________   PHYSICAL EXAM:  VITAL SIGNS: ED Triage Vitals  Enc Vitals Group     BP 01/13/17 0904 (!) 192/88     Pulse Rate 01/13/17 0904 (!) 57     Resp 01/13/17 0904 18     Temp 01/13/17 0904 97.5 F (36.4 C)     Temp Source 01/13/17 0904 Oral     SpO2 01/13/17 0904 99 %     Weight 01/13/17 0900 205 lb (93  kg)     Height 01/13/17 0900  (1.803 m)     Pain Score 01/13/17 0900 5   Constitutional: Alert and oriented. Well appearing and in no acute distress. Eyes: Conjunctivae are normal. Head: Atraumatic. Nose: No congestion/rhinnorhea. Mouth/Throat: Mucous membranes are moist.  Oropharynx non-erythematous. Neck: No stridor.   Cardiovascular: Normal rate, regular rhythm. Good peripheral circulation. Grossly normal heart sounds.   Respiratory: Normal respiratory effort.  No retractions. Lungs CTAB. Gastrointestinal: Soft and nontender. No distention.  Musculoskeletal: No lower extremity tenderness nor edema. No gross deformities of extremities. Neurologic:  Normal speech and language. No gross focal neurologic deficits are appreciated.  Skin:  Skin is warm, dry and intact. No rash noted.  ____________________________________________   LABS (all labs ordered are listed, but only abnormal results are displayed)  Labs Reviewed  BASIC METABOLIC PANEL - Abnormal; Notable for the following:       Result Value   Glucose, Bld 166 (*)    All other components within normal limits  TROPONIN I - Abnormal; Notable for the following:    Troponin I 0.33 (*)    All other components within normal limits  TROPONIN I - Abnormal; Notable for the following:    Troponin I 0.37 (*)    All other components within normal limits  GLUCOSE, CAPILLARY - Abnormal; Notable for the following:    Glucose-Capillary 195 (*)    All other components within normal limits  GLUCOSE, CAPILLARY - Abnormal; Notable for the following:    Glucose-Capillary 130 (*)    All other components within normal limits  GLUCOSE, CAPILLARY - Abnormal; Notable for the following:    Glucose-Capillary 122 (*)    All other components within normal limits  GLUCOSE, CAPILLARY - Abnormal; Notable for the following:    Glucose-Capillary 136 (*)    All other components within normal limits  TROPONIN I - Abnormal; Notable for the  following:    Troponin I 0.14 (*)    All other components within normal limits  CBC WITH DIFFERENTIAL/PLATELET  CBC  HEPARIN LEVEL (UNFRACTIONATED)  TROPONIN I  HEPARIN LEVEL (UNFRACTIONATED)  TROPONIN I  TROPONIN I  I-STAT TROPOININ, ED   ____________________________________________  EKG   EKG Interpretation  Date/Time:  Saturday January 13 2017 09:03:09 EDT Ventricular Rate:  54 PR Interval:    QRS Duration: 112 QT Interval:  467 QTC Calculation: 443 R Axis:   60 Text Interpretation:  Sinus rhythm Borderline intraventricular conduction delay No STEMI. Compared to prior tracing.  Confirmed by Elizzie Westergard MD, Abcde Oneil [phone]) on 01/13/2017 9:08:40 AM       ____________________________________________  RADIOLOGY  Dg Chest 2 View  Result Date: 01/13/2017 CLINICAL DATA:  76 year old male with history of cardiac arrhythmias and new onset left arm pain EXAM: CHEST  2 VIEW COMPARISON:  Prior chest x-ray 12/13/2012 ; lumbar spine radiographs 10/09/2014 FINDINGS:  The lungs are clear and negative for focal airspace consolidation, pulmonary edema or suspicious pulmonary nodule. No pleural effusion or pneumothorax. Cardiac and mediastinal contours are within normal limits. The thoracic aorta is mildly tortuous. No acute fracture or lytic or blastic osseous lesions. Stable T12 compression fracture. A compression fracture the superior endplate of L1 is new compared to 2014 but unchanged compared to 2016. The visualized upper abdominal bowel gas pattern is unremarkable. IMPRESSION: No active cardiopulmonary disease. Stable remote T12 and L1 compression fractures. Electronically Signed   By: Malachy Moan M.D.   On: 01/13/2017 10:41    ____________________________________________   PROCEDURES  Procedure(s) performed:   Procedures  None ____________________________________________   INITIAL IMPRESSION / ASSESSMENT AND PLAN / ED COURSE  Pertinent labs & imaging results that were  available during my care of the patient were reviewed by me and considered in my medical decision making (see chart for details).  Patient resents to the emergency department for evaluation of left arm and chest pain that started this morning. No exacerbating or alleviating factors. The pain quality is somewhat atypical with the patient has multiple risk factors for acute coronary syndrome which raise my concern for atypical presentation. Distant history of stress testing in 2012. No cath history. HEART score 5 with multiple risk factors, age, and non-specific EKG. No rash on exam to suggest shingles.   Discussed patient's case with hospitalist. Patient and family (if present) updated with plan. Care transferred to hospitlaist service.  I reviewed all nursing notes, vitals, pertinent old records, EKGs, labs, imaging (as available).  ____________________________________________  FINAL CLINICAL IMPRESSION(S) / ED DIAGNOSES  Final diagnoses:  Precordial chest pain     MEDICATIONS GIVEN DURING THIS VISIT:  Medications  lisinopril (PRINIVIL,ZESTRIL) tablet 2.5 mg (2.5 mg Oral Given 01/14/17 0916)  acetaminophen (TYLENOL) tablet 650 mg (650 mg Oral Given 01/13/17 1912)  ondansetron (ZOFRAN) injection 4 mg (not administered)  nitroGLYCERIN (NITROSTAT) SL tablet 0.4 mg (not administered)  insulin aspart (novoLOG) injection 0-9 Units (0 Units Subcutaneous Not Given 01/14/17 0800)  atorvastatin (LIPITOR) tablet 80 mg (80 mg Oral Given 01/13/17 1858)  heparin ADULT infusion 100 units/mL (25000 units/252mL sodium chloride 0.45%) (1,100 Units/hr Intravenous New Bag/Given 01/13/17 1850)  heparin 100-0.45 UNIT/ML-% infusion (  Not Given 01/13/17 1830)  aspirin EC tablet 81 mg (81 mg Oral Given 01/14/17 0915)  metoprolol tartrate (LOPRESSOR) tablet 12.5 mg (12.5 mg Oral Not Given 01/14/17 0915)  hydrALAZINE (APRESOLINE) injection 10 mg (not administered)  aspirin chewable tablet 243 mg (243 mg Oral Given  01/13/17 0941)  nitroGLYCERIN (NITROSTAT) SL tablet 0.4 mg (0.4 mg Sublingual Given 01/13/17 0942)  heparin bolus via infusion 3,000 Units (3,000 Units Intravenous Bolus from Bag 01/13/17 1830)     NEW OUTPATIENT MEDICATIONS STARTED DURING THIS VISIT:  None   Note:  This document was prepared using Dragon voice recognition software and may include unintentional dictation errors.  Alona Bene, MD Emergency Medicine  I personally performed the services described in this documentation, which was scribed in my presence. The recorded information has been reviewed and is accurate.       Maia Plan, MD 01/14/17 1011

## 2017-01-13 NOTE — Final Progress Note (Signed)
Notified MD of the patients troponin 0.33.  New orders given and followed.

## 2017-01-13 NOTE — ED Triage Notes (Signed)
Pt reports waking up with pain down the back of left arm.  States it is also in the left side of his chest in axillary area.

## 2017-01-13 NOTE — H&P (Addendum)
History and Physical    Shawn Lopez ZOX:[id number] DOB: 1940-11-28 DOA: 01/13/2017  PCP: Milana Obey, MD  Patient coming from: home  I have personally briefly reviewed patient's old medical records in [facility]  Chief Complaint: left arm pain  HPI: Shawn Lopez is a 76 y.o. male with medical history significant of hypertension, diabetes, hyperlipidemia. Patient woke up this morning with complaints of left arm pain on the inner aspect of his arm. He says his radiates down his left arm. This is described as a dull ache. He did have some discomfort on the lateral part of his left chest . he may have some shortness of breath last night, but did not have any today. Discomfort occurred at rest and on exertion. No associated nausea, diaphoresis, lightheadedness. No recent fever, cough, vomiting or diarrhea. He checked his blood pressure at home and it was noted to be greater than 200.  ED Course: Patient reports that discomfort began improving after he received nitroglycerin. EKG and cardiac enzymes are currently unremarkable. He was noted to markedly hypertensive with a systolic blood pressure 190s. He's been referred for admission.  Review of Systems: As per HPI otherwise 10 point review of systems negative.    Past Medical History:  Diagnosis Date  . Arthritis   . Atrial fibrillation (HCC) 2006   Echocardiogram in 2006-mild LVH with normal EF  . Borderline hypertension   . Chest pain 2001   negative stress nuclear in 2006; negative coronary angiography(no documentation); essentially normal PFTs in 07/2004; CT in 2012-lipomatous hypertrophy; mild atherosclerosis of the aorta and coronaries; mild dependent pulmonary scarring or atelectasis; small area of focal pleural thickening; chronic bronchitic changes on chest x-ray  . Chronic back pain    Prior trauma with vertebral fracture  . Diabetes mellitus   . Diabetes mellitus    CBGs 150-170 in 2011; 2112- treated  with single oral agent  . Hyperlipidemia   . Hypertension   . Osteopenia    by bone densitometry  . 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 APPENDECTOMY  1960  . CATARACT EXTRACTION  2012   right eye,lens implantation  . CHOLECYSTECTOMY    . CIRCUMCISION  2010   meatotomy and dilatation  . COLONOSCOPY N/A 01/14/2013   Procedure: COLONOSCOPY;  Surgeon: Dalia Heading, MD;  Location: [facility];  Service: Gastroenterology;  Laterality: N/A;     reports that he quit smoking about 19 years ago. His smoking use included Cigarettes. He has a 22.50 pack-year smoking history. He has never used smokeless tobacco. He reports that he does not drink alcohol or use drugs.  No Known Allergies  Family history: 2 brothers died in the early 14s from heart disease. Father had several MIs prior to his death  Prior to Admission medications   Medication Sig Start Date End Date Taking? Authorizing Provider  doxycycline (VIBRA-TABS) 100 MG tablet Take 1 tablet by mouth 2 (two) times daily. For 15 days, starting on 01/02/2017 01/02/17  Yes Historical Provider, MD  lisinopril (PRINIVIL,ZESTRIL) 2.5 MG tablet Take 1 tablet by mouth daily. 01/10/17  Yes Historical Provider, MD  metFORMIN (GLUCOPHAGE) 500 MG tablet Take 1,000 mg by mouth 2 (two) times daily.    Yes Historical Provider, MD  metoprolol tartrate (LOPRESSOR) 25 MG tablet Take 25 mg by mouth 2 (two) times daily.     Yes Historical Provider, MD  pravastatin (PRAVACHOL) 40 MG  tablet Take 40 mg by mouth 2 (two) times daily.    Yes Historical Provider, MD    Physical Exam: Vitals:   01/13/17 1030 01/13/17 1100 01/13/17 1115 01/13/17 1130  BP: 134/71 126/70  (!) 113/59  Pulse: (!) 49 (!) 47 (!) 49 (!) 47  Resp: Temp:      TempSrc:      SpO2: 98% 96% 97% 97%  Weight:      Height:        Constitutional: NAD, calm, comfortable Vitals:   01/13/17 1030 01/13/17 1100 01/13/17  1115 01/13/17 1130  BP: 134/71 126/70  (!) 113/59  Pulse: (!) 49 (!) 47 (!) 49 (!) 47  Resp: Temp:      TempSrc:      SpO2: 98% 96% 97% 97%  Weight:      Height:       Eyes: PERRL, lids and conjunctivae normal ENMT: Mucous membranes are moist. Posterior pharynx clear of any exudate or lesions.Normal dentition.  Neck: normal, supple, no masses, no thyromegaly Respiratory: clear to auscultation bilaterally, no wheezing, no crackles. Normal respiratory effort. No accessory muscle use.  Cardiovascular: Regular rate and rhythm, no murmurs / rubs / gallops. No extremity edema. 2+ pedal pulses. No carotid bruits.  Abdomen: no tenderness, no masses palpated. No hepatosplenomegaly. Bowel sounds positive.  Musculoskeletal: no clubbing / cyanosis. No joint deformity upper and lower extremities. Good ROM, no contractures. Normal muscle tone.  Skin: no rashes, lesions, ulcers. No induration Neurologic: CN 2-12 grossly intact. Sensation intact, DTR normal. Strength 5/5 in all 4.  Psychiatric: Normal judgment and insight. Alert and oriented x 3. Normal mood.   Labs on Admission: I have personally reviewed following labs and imaging studies  CBC:  Recent Labs Lab 01/13/17 0954  WBC 7.1  NEUTROABS 4.6  HGB 14.7  HCT 44.2  MCV 90.6  PLT 172   Basic Metabolic Panel:  Recent Labs Lab 01/13/17 0954  NA 135  K 4.0  CL 101  CO2 24  GLUCOSE 166*  BUN 17  CREATININE 0.80  CALCIUM 9.2   GFR: Estimated Creatinine Clearance: 93 mL/min (by C-G formula based on SCr of 0.8 mg/dL). Liver Function Tests: No results for input(s): AST, ALT, ALKPHOS, BILITOT, PROT, ALBUMIN in the last 168 hours. No results for input(s): LIPASE, AMYLASE in the last 168 hours. No results for input(s): AMMONIA in the last 168 hours. Coagulation Profile: No results for input(s): INR, PROTIME in the last 168 hours. Cardiac Enzymes: No results for input(s): CKTOTAL, CKMB, CKMBINDEX, TROPONINI in the last  168 hours. BNP (last 3 results) No results for input(s): PROBNP in the last 8760 hours. HbA1C: No results for input(s): HGBA1C in the last 72 hours. CBG: No results for input(s): GLUCAP in the last 168 hours. Lipid Profile: No results for input(s): CHOL, HDL, LDLCALC, TRIG, CHOLHDL, LDLDIRECT in the last 72 hours. Thyroid Function Tests: No results for input(s): TSH, T4TOTAL, FREET4, T3FREE, THYROIDAB in the last 72 hours. Anemia Panel: No results for input(s): VITAMINB12, FOLATE, FERRITIN, TIBC, IRON, RETICCTPCT in the last 72 hours. Urine analysis:    Component Value Date/Time   COLORURINE YELLOW 12/19/2008 1117   APPEARANCEUR CLOUDY (A) 12/19/2008 1117   LABSPEC 1.015 12/19/2008 1117   PHURINE 6.5 12/19/2008 1117   GLUCOSEU NEGATIVE 12/19/2008 1117   HGBUR TRACE (A) 12/19/2008 1117   BILIRUBINUR NEGATIVE 12/19/2008 1117   KETONESUR NEGATIVE 12/19/2008 1117   PROTEINUR  NEGATIVE 12/19/2008 1117   UROBILINOGEN 0.2 12/19/2008 1117   NITRITE POSITIVE (A) 12/19/2008 1117   LEUKOCYTESUR SMALL (A) 12/19/2008 1117    Radiological Exams on Admission: Dg Chest 2 View  Result Date: 01/13/2017 CLINICAL DATA:  76 year old male with history of cardiac arrhythmias and new onset left arm pain EXAM: CHEST  2 VIEW COMPARISON:  Prior chest x-ray 12/13/2012 ; lumbar spine radiographs 10/09/2014 FINDINGS: The lungs are clear and negative for focal airspace consolidation, pulmonary edema or suspicious pulmonary nodule. No pleural effusion or pneumothorax. Cardiac and mediastinal contours are within normal limits. The thoracic aorta is mildly tortuous. No acute fracture or lytic or blastic osseous lesions. Stable T12 compression fracture. A compression fracture the superior endplate of L1 is new compared to 2014 but unchanged compared to 2016. The visualized upper abdominal bowel gas pattern is unremarkable. IMPRESSION: No active cardiopulmonary disease. Stable remote T12 and L1 compression fractures.  Electronically Signed   By: Malachy Moan M.D.   On: 01/13/2017 10:41    EKG: Independently reviewed. Sinus rhythm without acute changes  Assessment/Plan Active Problems:   Hypertension   Hyperlipidemia   Diabetes mellitus (HCC)   Chest pain    1. Chest pain. Unclear etiology. With multiple risk factors, he'll be admitted for observation. Check cardiac markers. Check echocardiogram. Suspect this may be related to uncontrolled hypertension. Continue on aspirin. Stress test from 2012 was found to be unremarkable. Will likely need follow-up with cardiology for outpatient stress test.  2. Diabetes. Continue on metformin. Start outside scale insulin.  3. Hyperlipidemia. Continue statin  4. Hypertension. Blood pressure was uncontrolled on arrival, but has since improved. Continue on home lisinopril and metoprolol.   DVT prophylaxis: lovenox Code Status: full code Family Communication: discussed with wife at the bedside Disposition Plan: discharge home in am if improved Consults called:  Admission status: observation, telemetry   Bilal Manzer MD [facility] Pager [phone]  If 7PM-7AM, please contact night-coverage [email or web address] Password [facility]  01/13/2017, 3:24 PM   Addendum 17:30:  Patient's initial troponin returned at 0.33. He has not had any recurrence of chest pain. His case was discussed with Dr. Gala Romney, on-call for cardiology at [facility]. patient's history is somewhat concerning, and now with rising troponin, recommendations were for transfer to [facility]. He is already on aspirin. He'll be started on heparin infusion. He is already on beta blockers and will be started on Lipitor.  Orpah Hausner

## 2017-01-14 ENCOUNTER — Encounter (HOSPITAL_COMMUNITY): Payer: Self-pay | Admitting: Physician Assistant

## 2017-01-14 ENCOUNTER — Observation Stay (HOSPITAL_BASED_OUTPATIENT_CLINIC_OR_DEPARTMENT_OTHER): Payer: Medicare HMO

## 2017-01-14 DIAGNOSIS — Z87891 Personal history of nicotine dependence: Secondary | ICD-10-CM | POA: Diagnosis not present

## 2017-01-14 DIAGNOSIS — I480 Paroxysmal atrial fibrillation: Secondary | ICD-10-CM | POA: Diagnosis not present

## 2017-01-14 DIAGNOSIS — R001 Bradycardia, unspecified: Secondary | ICD-10-CM | POA: Diagnosis present

## 2017-01-14 DIAGNOSIS — Z7984 Long term (current) use of oral hypoglycemic drugs: Secondary | ICD-10-CM | POA: Diagnosis not present

## 2017-01-14 DIAGNOSIS — I10 Essential (primary) hypertension: Secondary | ICD-10-CM | POA: Diagnosis present

## 2017-01-14 DIAGNOSIS — E118 Type 2 diabetes mellitus with unspecified complications: Secondary | ICD-10-CM | POA: Diagnosis not present

## 2017-01-14 DIAGNOSIS — R002 Palpitations: Secondary | ICD-10-CM | POA: Diagnosis present

## 2017-01-14 DIAGNOSIS — I214 Non-ST elevation (NSTEMI) myocardial infarction: Principal | ICD-10-CM

## 2017-01-14 DIAGNOSIS — I25110 Atherosclerotic heart disease of native coronary artery with unstable angina pectoris: Secondary | ICD-10-CM | POA: Diagnosis present

## 2017-01-14 DIAGNOSIS — Z8249 Family history of ischemic heart disease and other diseases of the circulatory system: Secondary | ICD-10-CM | POA: Diagnosis not present

## 2017-01-14 DIAGNOSIS — R072 Precordial pain: Secondary | ICD-10-CM

## 2017-01-14 DIAGNOSIS — R079 Chest pain, unspecified: Secondary | ICD-10-CM | POA: Diagnosis not present

## 2017-01-14 DIAGNOSIS — E119 Type 2 diabetes mellitus without complications: Secondary | ICD-10-CM | POA: Diagnosis present

## 2017-01-14 DIAGNOSIS — I2510 Atherosclerotic heart disease of native coronary artery without angina pectoris: Secondary | ICD-10-CM | POA: Diagnosis not present

## 2017-01-14 DIAGNOSIS — E785 Hyperlipidemia, unspecified: Secondary | ICD-10-CM | POA: Diagnosis present

## 2017-01-14 LAB — ECHOCARDIOGRAM COMPLETE
CHL CUP DOP CALC LVOT VTI: 29 cm
CHL CUP RV SYS PRESS: 32 mmHg
CHL CUP TV REG PEAK VELOCITY: 267 cm/s
EERAT: 14.89
EWDT: 282 ms
FS: 32 % (ref 28–44)
Height: 70 in
IVS/LV PW RATIO, ED: 1
LA diam index: 1.74 cm/m2
LA vol A4C: 91.6 ml
LA vol: 89.8 mL
LASIZE: 36 mm
LAVOLIN: 43.4 mL/m2
LDCA: 3.14 cm2
LEFT ATRIUM END SYS DIAM: 36 mm
LV E/e' medial: 14.89
LV PW d: 15.6 mm — AB (ref 0.6–1.1)
LV TDI E'MEDIAL: 5.66
LV e' LATERAL: 6.85 cm/s
LVEEAVG: 14.89
LVOT SV: 91 mL
LVOT diameter: 20 mm
LVOT peak grad rest: 8 mmHg
LVOTPV: 141 cm/s
Lateral S' vel: 12.5 cm/s
MV Dec: 282
MV Peak grad: 4 mmHg
MV pk A vel: 110 m/s
MV pk E vel: 102 m/s
RV TAPSE: 24.8 mm
TDI e' lateral: 6.85
TR max vel: 267 cm/s
Weight: 3136 oz

## 2017-01-14 LAB — GLUCOSE, CAPILLARY
GLUCOSE-CAPILLARY: 139 mg/dL — AB (ref 65–99)
Glucose-Capillary: 136 mg/dL — ABNORMAL HIGH (ref 65–99)
Glucose-Capillary: 161 mg/dL — ABNORMAL HIGH (ref 65–99)
Glucose-Capillary: 184 mg/dL — ABNORMAL HIGH (ref 65–99)
Glucose-Capillary: 171 mg/dL — ABNORMAL HIGH (ref 65–99)

## 2017-01-14 LAB — CBC
HEMATOCRIT: 43.8 % (ref 39.0–52.0)
HEMOGLOBIN: 14.4 g/dL (ref 13.0–17.0)
MCH: 29.8 pg (ref 26.0–34.0)
MCHC: 32.9 g/dL (ref 30.0–36.0)
MCV: 90.5 fL (ref 78.0–100.0)
Platelets: 172 10*3/uL (ref 150–400)
RBC: 4.84 MIL/uL (ref 4.22–5.81)
RDW: 13.3 % (ref 11.5–15.5)
WBC: 8.9 10*3/uL (ref 4.0–10.5)

## 2017-01-14 LAB — TROPONIN I
TROPONIN I: 0.15 ng/mL — AB (ref <0.03)
Troponin I: 0.14 ng/mL (ref <0.03)
Troponin I: 0.15 ng/mL (ref <0.03)

## 2017-01-14 LAB — HEPARIN LEVEL (UNFRACTIONATED): Heparin Unfractionated: 0.52 IU/mL (ref 0.30–0.70)

## 2017-01-14 MED ORDER — SODIUM CHLORIDE 0.9 % WEIGHT BASED INFUSION
3.0000 mL/kg/h | INTRAVENOUS | Status: DC
  Administered 2017-01-15: 3 mL/kg/h via INTRAVENOUS

## 2017-01-14 MED ORDER — ASPIRIN EC 81 MG PO TBEC
81.0000 mg | DELAYED_RELEASE_TABLET | Freq: Every day | ORAL | Status: DC
  Administered 2017-01-16: 08:00:00 81 mg via ORAL
  Filled 2017-01-14: qty 1

## 2017-01-14 MED ORDER — SODIUM CHLORIDE 0.9% FLUSH: 3.0000 mL | Freq: Two times a day (BID) | INTRAVENOUS | Status: DC

## 2017-01-14 MED ORDER — ASPIRIN EC 81 MG PO TBEC
81.0000 mg | DELAYED_RELEASE_TABLET | Freq: Every day | ORAL | Status: DC
  Administered 2017-01-14: 81 mg via ORAL
  Filled 2017-01-14: qty 1

## 2017-01-14 MED ORDER — HYDRALAZINE HCL 20 MG/ML IJ SOLN
10.0000 mg | INTRAMUSCULAR | Status: DC | PRN
  Administered 2017-01-15: 19:00:00 10 mg via INTRAVENOUS
  Filled 2017-01-14: qty 1

## 2017-01-14 MED ORDER — SODIUM CHLORIDE 0.9% FLUSH: 3.0000 mL | INTRAVENOUS | Status: DC | PRN

## 2017-01-14 MED ORDER — ASPIRIN 81 MG PO CHEW
81.0000 mg | CHEWABLE_TABLET | ORAL | Status: AC
  Administered 2017-01-15: 81 mg via ORAL
  Filled 2017-01-14: qty 1

## 2017-01-14 MED ORDER — METOPROLOL TARTRATE 12.5 MG HALF TABLET
12.5000 mg | ORAL_TABLET | Freq: Two times a day (BID) | ORAL | Status: DC
  Administered 2017-01-14 – 2017-01-16 (×3): 12.5 mg via ORAL
  Filled 2017-01-14 (×5): qty 1

## 2017-01-14 MED ORDER — SODIUM CHLORIDE 0.9 % IV SOLN: 250.0000 mL | INTRAVENOUS | Status: DC | PRN

## 2017-01-14 MED ORDER — SODIUM CHLORIDE 0.9 % WEIGHT BASED INFUSION
1.0000 mL/kg/h | INTRAVENOUS | Status: DC
  Administered 2017-01-15 (×2): 1 mL/kg/h via INTRAVENOUS

## 2017-01-14 NOTE — Progress Notes (Signed)
  Echocardiogram 2D Echocardiogram has been performed.  Kalia Vahey T Maliyah Willets 01/14/2017, 10:19 AM

## 2017-01-14 NOTE — Progress Notes (Signed)
ANTICOAGULATION CONSULT NOTE - Follow-up Consult  Pharmacy Consult for HEPARIN Indication: chest pain/ACS  No Known Allergies  Patient Measurements: Height:  (177.8 cm) Weight: 195 lb 8 oz (88.7 kg) IBW/kg (Calculated) : 73  HEPARIN DW (KG): 88.7  Vital Signs: Temp: 97.6 F (36.4 C) (04/21 2155) Temp Source: Oral (04/21 2041) BP: 134/60 (04/21 2300) Pulse Rate: 51 (04/21 2300)  Labs:  Recent Labs  01/13/17 0954 01/13/17 1551 01/13/17 1810 01/14/17 0216  HGB 14.7  --   --  14.4  HCT 44.2  --   --  43.8  PLT 172  --   --  172  HEPARINUNFRC  --   --   --  0.52  CREATININE 0.80  --   --   --   TROPONINI  --  0.33* 0.37*  --     Estimated Creatinine Clearance: 89.5 mL/min (by C-G formula based on SCr of 0.8 mg/dL).   Assessment: 76yo male on heparin for r/o ACS. Heparin level therapeutic on gtt at 1100 units/hr. CBC stable. No bleeding noted.  Goal of Therapy:  Heparin level 0.3-0.7 units/ml Monitor platelets by anticoagulation protocol: Yes   Plan:  Continue heparin at 1100 units/hr Will f/u confirmatory heparin level  Christoper Fabian, PharmD, BCPS Clinical pharmacist, pager [phone] 01/14/2017,2:45 AM

## 2017-01-14 NOTE — Plan of Care (Signed)
Problem: Education: Goal: Knowledge of [facility] Education information/materials will improve Outcome: Progressing Patient aware of plan of care.     

## 2017-01-14 NOTE — Progress Notes (Signed)
Critical Lab value: Troponin 0.14. MD notified.

## 2017-01-14 NOTE — Progress Notes (Signed)
CRITICAL VALUE ALERT  Critical value received:  Troponin 0.14  Date of notification:  01/14/17  Time of notification:  09:42  Critical value read back:Yes.    Nurse who received alert: Antonietta Breach  MD notified (1st page):  Catha Gosselin, MD  Time of first page: 09:43  MD notified (2nd page):  Time of second page:  Responding MD: Catha Gosselin, MD  Time MD responded:  0:947

## 2017-01-14 NOTE — Progress Notes (Signed)
PROGRESS NOTE    Shawn Lopez  ZOX:[id number] DOB: 1940/10/12 DOA: 01/13/2017 PCP: Shawn Obey, MD   Chief Complaint  Patient presents with  . Chest Pain    Brief Narrative:  HPI On 01/13/2017 by Dr. Erick Blinks  Shawn Lopez is a 76 y.o. male with medical history significant of hypertension, diabetes, hyperlipidemia. Patient woke up this morning with complaints of left arm pain on the inner aspect of his arm. He says his radiates down his left arm. This is described as a dull ache. He did have some discomfort on the lateral part of his left chest . he may have some shortness of breath last night, but did not have any today. Discomfort occurred at rest and on exertion. No associated nausea, diaphoresis, lightheadedness. No recent fever, cough, vomiting or diarrhea. He checked his blood pressure at home and it was noted to be greater than 200.  Interim history Cardiology consulted for NSTEMI. Plan for cardiac catheterization on 01/15/2017.  Assessment & Plan   Chest pain/NSTEMI -Patient presented with left arm pain which resolved after receiving nitroglycerin -Patient does have risk factors including diabetes, hypertension, hyperlipidemia, former smoker, family history of coronary artery disease -Troponin peaked to 0.37, currently trending downward at 0.14 -Cardiology consulted and appreciated -Currently patient has no chest pain. -Echocardiogram: EF 60%, grade 1 diastolic dysfunction, trace AI, moderate LAE, mild TR, mildly elevated pulm pressure -Plan for cardiac catheterization on 01/16/2015 -Continue statin, BB, ACEi  Diabetes mellitus, type II -Metformin currently held -Continue insulin place scale CBG monitoring  Hyperlipidemia -Continue statin -Lipid panel pending  Essential hypertension -Continue lisinopril -Metoprolol has been reduced this patient has had some bradycardia -Continue hydralazine IV as needed  Palpitations/paroxysmal atrial  fibrillation -Patient did have atrial fibrillation back in 2006 -Currently in sinus rhythm, telemetry shows PACs and sinus bradycardia -Cardiology consulted and appreciated  DVT Prophylaxis  heparin  Code Status: Full  Family Communication: None at bedside  Disposition Plan: observation, pending catheterization on 01/15/17. Home when stable  Consultants Cardiology  Procedures  Echcoardiogram  Antibiotics   Anti-infectives    None      Subjective:   Shawn Lopez seen and examined today.  Patient complains of arm pain. Denies any current chest pain or shortness of breath, abdominal pain, nausea vomiting, dizziness or headache.  Objective:   Vitals:   01/13/17 2200 01/13/17 2300 01/14/17 0513 01/14/17 0915  BP: (!) 143/77 134/60 128/67 135/90  Pulse: (!) 51 (!) 51 (!) 48 (!) 49  Resp: Temp:   98.3 F (36.8 C)   TempSrc:      SpO2: 95%  96% 98%  Weight:   88.9 kg (196 lb)   Height:        Intake/Output Summary (Last 24 hours) at 01/14/17 1108 Last data filed at 01/14/17 0740  Gross per 24 hour  Intake              448 ml  Output             1800 ml  Net            -1352 ml   Filed Weights   01/13/17 0900 01/13/17 2155 01/14/17 0513  Weight: 93 kg (205 lb) 88.7 kg (195 lb 8 oz) 88.9 kg (196 lb)    Exam  General: Well developed, well nourished, NAD, appears stated age  HEENT: NCAT, mucous membranes moist.   Neck: Supple, no JVD, no masses  Cardiovascular: S1 S2 auscultated, bradycardic, no murmurs  Respiratory: Clear to auscultation bilaterally with equal chest rise  Abdomen: Soft, nontender, nondistended, + bowel sounds  Extremities: warm dry without cyanosis clubbing or edema  Neuro: AAOx3, nonfocal  Psych: Normal affect and demeanor with intact judgement and insight   Data Reviewed: I have personally reviewed following labs and imaging studies  CBC:  Recent Labs Lab 01/13/17 0954 01/14/17 0216  WBC 7.1 8.9  NEUTROABS  4.6  --   HGB 14.7 14.4  HCT 44.2 43.8  MCV 90.6 90.5  PLT 172 172   Basic Metabolic Panel:  Recent Labs Lab 01/13/17 0954  NA 135  K 4.0  CL 101  CO2 24  GLUCOSE 166*  BUN 17  CREATININE 0.80  CALCIUM 9.2   GFR: Estimated Creatinine Clearance: 89.6 mL/min (by C-G formula based on SCr of 0.8 mg/dL). Liver Function Tests: No results for input(s): AST, ALT, ALKPHOS, BILITOT, PROT, ALBUMIN in the last 168 hours. No results for input(s): LIPASE, AMYLASE in the last 168 hours. No results for input(s): AMMONIA in the last 168 hours. Coagulation Profile: No results for input(s): INR, PROTIME in the last 168 hours. Cardiac Enzymes:  Recent Labs Lab 01/13/17 1551 01/13/17 1810 01/14/17 0808  TROPONINI 0.33* 0.37* 0.14*   BNP (last 3 results) No results for input(s): PROBNP in the last 8760 hours. HbA1C: No results for input(s): HGBA1C in the last 72 hours. CBG:  Recent Labs Lab 01/13/17 1639 01/13/17 1818 01/13/17 1907 01/14/17 0743  GLUCAP 195* 130* 122* 136*   Lipid Profile: No results for input(s): CHOL, HDL, LDLCALC, TRIG, CHOLHDL, LDLDIRECT in the last 72 hours. Thyroid Function Tests: No results for input(s): TSH, T4TOTAL, FREET4, T3FREE, THYROIDAB in the last 72 hours. Anemia Panel: No results for input(s): VITAMINB12, FOLATE, FERRITIN, TIBC, IRON, RETICCTPCT in the last 72 hours. Urine analysis:    Component Value Date/Time   COLORURINE YELLOW 12/19/2008 1117   APPEARANCEUR CLOUDY (A) 12/19/2008 1117   LABSPEC 1.015 12/19/2008 1117   PHURINE 6.5 12/19/2008 1117   GLUCOSEU NEGATIVE 12/19/2008 1117   HGBUR TRACE (A) 12/19/2008 1117   BILIRUBINUR NEGATIVE 12/19/2008 1117   KETONESUR NEGATIVE 12/19/2008 1117   PROTEINUR NEGATIVE 12/19/2008 1117   UROBILINOGEN 0.2 12/19/2008 1117   NITRITE POSITIVE (A) 12/19/2008 1117   LEUKOCYTESUR SMALL (A) 12/19/2008 1117   Sepsis Labs: (procalcitonin:4,lacticidven:4)  )No results found for this or  any previous visit (from the past 240 hour(s)).    Radiology Studies: Dg Chest 2 View  Result Date: 01/13/2017 CLINICAL DATA:  76 year old male with history of cardiac arrhythmias and new onset left arm pain EXAM: CHEST  2 VIEW COMPARISON:  Prior chest x-ray 12/13/2012 ; lumbar spine radiographs 10/09/2014 FINDINGS: The lungs are clear and negative for focal airspace consolidation, pulmonary edema or suspicious pulmonary nodule. No pleural effusion or pneumothorax. Cardiac and mediastinal contours are within normal limits. The thoracic aorta is mildly tortuous. No acute fracture or lytic or blastic osseous lesions. Stable T12 compression fracture. A compression fracture the superior endplate of L1 is new compared to 2014 but unchanged compared to 2016. The visualized upper abdominal bowel gas pattern is unremarkable. IMPRESSION: No active cardiopulmonary disease. Stable remote T12 and L1 compression fractures. Electronically Signed   By: Malachy Moan M.D.   On: 01/13/2017 10:41     Scheduled Meds: . aspirin EC  81 mg Oral Daily  . atorvastatin  80 mg Oral q1800  . insulin aspart  0-9 Units  Subcutaneous TID WC  . lisinopril  2.5 mg Oral Daily  . metoprolol tartrate  12.5 mg Oral BID   Continuous Infusions: . heparin 1,100 Units/hr (01/13/17 1850)     LOS: 0 days   Time Spent in minutes   30 minutes  Doria Fern D.O. on 01/14/2017 at 11:08 AM  Between 7am to 7pm - Pager - [phone]  After 7pm go to [email or web address] - password [email or web address]  And look for the night coverage person covering for me after hours  [facility] Office  [phone]

## 2017-01-14 NOTE — Consult Note (Signed)
CARDIOLOGY CONSULT NOTE   Patient ID: Shawn Lopez MRN: [id number] DOB/AGE: Oct 07, 1940 76 y.o.  Admit date: 01/13/2017  Primary Physician   Milana Obey, MD Primary Cardiologist   New to [facility]  Reason for Consultation   NSTEMI Requesting Physician  Dr. Catha Gosselin  HPI: Shawn Lopez is a 76 y.o. male with a history of with hx of HTN, HLD, DM, remote hx of afib and tobacco abuse consulted for NSTEMI by Dr. Catha Gosselin.   He is a previous patient of Dr. Dietrich Pates in [place]. He developed 2 episodes of atrial fibrillation in 2006 at which time he had an essentially normal echocardiogram and normal thyroid function. Last seen by Dr. Dietrich Pates 03/2011 for chest discomfort and coronary calcification noted on CT scanning of the chest. Followed up exercise Myoview is normal.   He is in [facility] up-until yesterday running around 7 AM after waking up he had a left arm pain described as a sharp achy. No associated shortness of breath, chest pain, palpitation, diaphoresis, nausea or vomiting. Pain lasted for approximately 2 hours leading to evaluation and [facility]. Where his symptoms resolved after sublingual nitroglycerin x 1. Initial troponins was negative with blood pressure above 190s. He was admitted for further evaluation. EKG shows sinus rhythm at rate of 54 bpm with nonspecific conduction abnormality - personality reviewed. Repeat troponin 0.33-->0.37. He was started on IV heparin and transferred to [facility] for further evaluation. He is chest pain-free since admission. Telemetry shows sinus bradycardia at rate of 40s on metoprolol 25 mg twice a day.  He does admits to having intermittent palpitation. No associated dizziness, shortness of breath or syncope. Somewhat similar when he had a episode of A. fib in 2006. He denies orthopnea, PND, syncope, melena or blood in his stool or urine. Intermittent ankle edema.  Prior history of tobacco smoking for 30-pack-year. Quit  approximately 20 years ago. His father had a several heart attack, first in his 76s. Brother died of MI at age 61. Another brother died of MI at age 15.  Past Medical History:  Diagnosis Date  . Arthritis   . Atrial fibrillation (HCC) 2006   Echocardiogram in 2006-mild LVH with normal EF  . Borderline hypertension   . Chest pain 2001   negative stress nuclear in 2006; negative coronary angiography(no documentation); essentially normal PFTs in 07/2004; CT in 2012-lipomatous hypertrophy; mild atherosclerosis of the aorta and coronaries; mild dependent pulmonary scarring or atelectasis; small area of focal pleural thickening; chronic bronchitic changes on chest x-ray  . Chronic back pain    Prior trauma with vertebral fracture  . Diabetes mellitus   . Diabetes mellitus    CBGs 150-170 in 2011; 2112- treated with single oral agent  . Hyperlipidemia   . Hypertension   . Osteopenia    by bone densitometry  . 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 APPENDECTOMY  1960  . CATARACT EXTRACTION  2012   right eye,lens implantation  . CHOLECYSTECTOMY    . CIRCUMCISION  2010   meatotomy and dilatation  . COLONOSCOPY N/A 01/14/2013   Procedure: COLONOSCOPY;  Surgeon: Dalia Heading, MD;  Location: [facility];  Service: Gastroenterology;  Laterality: N/A;    No Known Allergies  I have reviewed the patient's current medications . aspirin EC  81 mg Oral Daily  . atorvastatin  80 mg Oral q1800  . insulin aspart  0-9 Units  Subcutaneous TID WC  . lisinopril  2.5 mg Oral Daily  . metoprolol tartrate  25 mg Oral BID   . heparin 1,100 Units/hr (01/13/17 1850)   acetaminophen, nitroGLYCERIN, ondansetron (ZOFRAN) IV  Prior to Admission medications   Medication Sig Start Date End Date Taking? Authorizing Provider  doxycycline (VIBRA-TABS) 100 MG tablet Take 1 tablet by mouth 2 (two) times daily. For 15 days, starting on 01/02/2017 01/02/17   Yes Historical Provider, MD  lisinopril (PRINIVIL,ZESTRIL) 2.5 MG tablet Take 1 tablet by mouth daily. 01/10/17  Yes Historical Provider, MD  metFORMIN (GLUCOPHAGE) 500 MG tablet Take 1,000 mg by mouth 2 (two) times daily.    Yes Historical Provider, MD  metoprolol tartrate (LOPRESSOR) 25 MG tablet Take 25 mg by mouth 2 (two) times daily.     Yes Historical Provider, MD  pravastatin (PRAVACHOL) 40 MG tablet Take 40 mg by mouth 2 (two) times daily.    Yes Historical Provider, MD     Social History   Social History  . Marital status: Married    Spouse name: N/A  . Number of children: 6  . Years of education: N/A   Occupational History  . Hospital doctor   Social History Main Topics  . Smoking status: Former Smoker    Packs/day: 1.50    Years: 15.00    Types: Cigarettes    Quit date: 04/20/1997  . Smokeless tobacco: Never Used  . Alcohol use No  . Drug use: No  . Sexual activity: Not on file   Other Topics Concern  . Not on file   Social History Narrative  . No narrative on file    Family Status  Relation Status  . Father Deceased at age 24   cause unknown  . Mother Deceased at age 54   Alzheimer's dementia  . Brother Deceased   myocardial infarction; second brother also died with coronary disease; a third brother is alive and well  . Sister Alive   healthy      ROS:  Full 14 point review of systems complete and found to be negative unless listed above.  Physical Exam: Blood pressure 128/67, pulse (!) 48, temperature 98.3 F (36.8 C), resp. rate 16, height  (1.778 m), weight 196 lb (88.9 kg), SpO2 96 %.  General: Well developed, well nourished, male in no acute distress Head: Eyes PERRLA, No xanthomas. Normocephalic and atraumatic, oropharynx without edema or exudate.  Lungs: Resp regular and unlabored, CTA. Heart: RRR no s3, s4, or murmurs..   Neck: No carotid bruits. No lymphadenopathy. No JVD. Abdomen: Bowel sounds present,  abdomen soft and non-tender without masses or hernias noted. Msk:  No spine or cva tenderness. No weakness, no joint deformities or effusions. Extremities: No clubbing, cyanosis or edema. DP/PT/Radials 2+ and equal bilaterally. Neuro: Alert and oriented X 3. No focal deficits noted. Psych:  Good affect, responds appropriately Skin: No rashes or lesions noted.  Labs:   Lab Results  Component Value Date   WBC 8.9 01/14/2017   HGB 14.4 01/14/2017   HCT 43.8 01/14/2017   MCV 90.5 01/14/2017   PLT 172 01/14/2017   No results for input(s): INR in the last 72 hours.   Recent Labs Lab 01/13/17 0954  NA 135  K 4.0  CL 101  CO2 24  BUN 17  CREATININE 0.80  CALCIUM 9.2  GLUCOSE 166*   No results found for: MG  Recent Labs  01/13/17 1551  01/13/17 1810  TROPONINI 0.33* 0.37*    Recent Labs  01/13/17 0952  TROPIPOC 0.00   NUCLEAR MEDICINE STRESS MYOVIEW STUDY WITH SPECT AND LEFT VENTRICULAR EJECTION FRACTION 05/2011   IMPRESSION: Negative stress nuclear myocardial study revealing somewhat impaired exercise capacity, a negative stress EKG, normal left ventricular size and normal left ventricular systolic function.  By scintigraphic imaging, there was some inhomogeneity of perfusion but no significant evidence for ischemia or infarction.  Other findings as noted  Radiology:  Dg Chest 2 View  Result Date: 01/13/2017 CLINICAL DATA:  76 year old male with history of cardiac arrhythmias and new onset left arm pain EXAM: CHEST  2 VIEW COMPARISON:  Prior chest x-ray 12/13/2012 ; lumbar spine radiographs 10/09/2014 FINDINGS: The lungs are clear and negative for focal airspace consolidation, pulmonary edema or suspicious pulmonary nodule. No pleural effusion or pneumothorax. Cardiac and mediastinal contours are within normal limits. The thoracic aorta is mildly tortuous. No acute fracture or lytic or blastic osseous lesions. Stable T12 compression fracture. A compression fracture  the superior endplate of L1 is new compared to 2014 but unchanged compared to 2016. The visualized upper abdominal bowel gas pattern is unremarkable. IMPRESSION: No active cardiopulmonary disease. Stable remote T12 and L1 compression fractures. Electronically Signed   By: Malachy Moan M.D.   On: 01/13/2017 10:41    ASSESSMENT AND PLAN:     1. NSTEMI - He has no chest pain, however, his left arm pain resolved after sublingual nitroglycerin x1. Troponin trend 0.0-->0.33-->0.37 (last reading  - 4/21) . Will continue cycle troponin to see a clear trend.  - His Cardiac risk factors includes hypertension, hyperlipidemia, diabetes, former smoker and strong family history of CAD. Will do cath for definitive evaluation of coronary anatomy tomorrow. Echo today.  - Continue aspirin, IV heparin, statin, beta blocker and ace.  2. HTN - BP of 192/88 at presentation. Now improved to 128/67. Patient has sinus bradycardia on monitor with rate of 40s. Will reduce metoprolol to 12.5mg  BID. Continue lisinopril to 2.5mg  qd. May increase post cath if needed. Give PRN hydralazine for BP > 160s. Follow closely.   3. Palpitations - Intermittent palpitation for many months. Symptoms somewhat similar to prior episode of A. fib in 2006 as described above. Asymptomatic. Telemetry without arrhythmia. However shows PACs at rate of 40s. Medication changes as above. Follow closely. Might need a monitor as outpatient. Follow clinically.  4. DM - Per primary  5. HLD - Continue statin. Check panel in AM.   SignedManson Passey, PA 01/14/2017, 8:14 AM  Patient seen and examined with Vin Bhagat PA-C. We discussed all aspects of the encounter. I agree with the assessment and plan as stated above.   Presentation c/w NSTEMI. No further symptoms. Echo reviewed at bedside - no RWMA. He has strong FHx for CAD. Will plan cardiac cath tomorrow. Continue ASA, heparin and statin. B-blocker reduced due to  bradycardia.  He does have remote h/o PAF but none recently. Currently in sinus brady. We will follow. If recurs will need to considerr [facility].   I have reviewed the risks, indications, and alternatives to angioplasty and stenting with the patient. Risks include but are not limited to bleeding, infection, vascular injury, stroke, myocardial infection, arrhythmia, kidney injury, radiation-related injury in the case of prolonged fluoroscopy use, emergency cardiac surgery, and death. The patient understands the risks of serious complication is low (<1%) and he agrees to proceed.   Arvilla Meres, MD  8:42 AM

## 2017-01-14 NOTE — Progress Notes (Signed)
Patient HR 49. MD notified. Metoprolol on hold at this time. Patient refused  1 unit of insulin. Patient states " I'm fine that's normal to me and I'm taking Metformin ". Patient's spouse at bedside. Patient asymptomatic.

## 2017-01-15 ENCOUNTER — Encounter (HOSPITAL_COMMUNITY)
Admission: EM | Disposition: A | Discharge status: Home / Self Care | Payer: Self-pay | Source: Home / Self Care | Attending: Internal Medicine

## 2017-01-15 DIAGNOSIS — I2510 Atherosclerotic heart disease of native coronary artery without angina pectoris: Secondary | ICD-10-CM

## 2017-01-15 HISTORY — PX: CORONARY STENT INTERVENTION: CATH118234

## 2017-01-15 HISTORY — PX: CORONARY ANGIOGRAPHY: CATH118303

## 2017-01-15 LAB — GLUCOSE, CAPILLARY
GLUCOSE-CAPILLARY: 118 mg/dL — AB (ref 65–99)
Glucose-Capillary: 137 mg/dL — ABNORMAL HIGH (ref 65–99)
Glucose-Capillary: 123 mg/dL — ABNORMAL HIGH (ref 65–99)
Glucose-Capillary: 126 mg/dL — ABNORMAL HIGH (ref 65–99)
Glucose-Capillary: 165 mg/dL — ABNORMAL HIGH (ref 65–99)
Glucose-Capillary: 171 mg/dL — ABNORMAL HIGH (ref 65–99)

## 2017-01-15 LAB — CBC
HEMATOCRIT: 45.5 % (ref 39.0–52.0)
Hemoglobin: 15 g/dL (ref 13.0–17.0)
MCH: 29.6 pg (ref 26.0–34.0)
MCHC: 33 g/dL (ref 30.0–36.0)
MCV: 89.7 fL (ref 78.0–100.0)
Platelets: 171 10*3/uL (ref 150–400)
RBC: 5.07 MIL/uL (ref 4.22–5.81)
RDW: 13.3 % (ref 11.5–15.5)
WBC: 7.1 10*3/uL (ref 4.0–10.5)

## 2017-01-15 LAB — BASIC METABOLIC PANEL
ANION GAP: 8 (ref 5–15)
BUN: 13 mg/dL (ref 6–20)
CALCIUM: 9 mg/dL (ref 8.9–10.3)
CO2: 24 mmol/L (ref 22–32)
Chloride: 102 mmol/L (ref 101–111)
Creatinine, Ser: 0.69 mg/dL (ref 0.61–1.24)
GLUCOSE: 162 mg/dL — AB (ref 65–99)
Potassium: 4 mmol/L (ref 3.5–5.1)
Sodium: 134 mmol/L — ABNORMAL LOW (ref 135–145)

## 2017-01-15 LAB — LIPID PANEL
Cholesterol: 123 mg/dL (ref 0–200)
HDL: 36 mg/dL — ABNORMAL LOW (ref >40)
LDL CALC: 55 mg/dL (ref 0–99)
TRIGLYCERIDES: 158 mg/dL — AB (ref <150)
Total CHOL/HDL Ratio: 3.4 RATIO
VLDL: 32 mg/dL (ref 0–40)

## 2017-01-15 LAB — HEPARIN LEVEL (UNFRACTIONATED): Heparin Unfractionated: 0.34 IU/mL (ref 0.30–0.70)

## 2017-01-15 LAB — PROTIME-INR
INR: 1.01
PROTHROMBIN TIME: 13.3 s (ref 11.4–15.2)

## 2017-01-15 LAB — MAGNESIUM: Magnesium: 2.2 mg/dL (ref 1.7–2.4)

## 2017-01-15 LAB — POCT ACTIVATED CLOTTING TIME: ACTIVATED CLOTTING TIME: 477 s

## 2017-01-15 SURGERY — CORONARY STENT INTERVENTION
Anesthesia: LOCAL

## 2017-01-15 MED ORDER — ANGIOPLASTY BOOK
Freq: Once | Status: AC
  Administered 2017-01-15: 21:00:00
  Filled 2017-01-15: qty 1

## 2017-01-15 MED ORDER — TICAGRELOR 90 MG PO TABS
ORAL_TABLET | ORAL | Status: DC | PRN
  Administered 2017-01-15: 180 mg via ORAL

## 2017-01-15 MED ORDER — BIVALIRUDIN TRIFLUOROACETATE 250 MG IV SOLR
INTRAVENOUS | Status: AC
  Filled 2017-01-15: qty 250

## 2017-01-15 MED ORDER — HEPARIN (PORCINE) IN NACL 2-0.9 UNIT/ML-% IJ SOLN
INTRAMUSCULAR | Status: AC
  Filled 2017-01-15: qty 1000

## 2017-01-15 MED ORDER — SODIUM CHLORIDE 0.9 % IV SOLN
INTRAVENOUS | Status: AC
  Administered 2017-01-15 – 2017-01-16 (×2): via INTRAVENOUS

## 2017-01-15 MED ORDER — BIVALIRUDIN BOLUS VIA INFUSION - CUPID
INTRAVENOUS | Status: DC | PRN
  Administered 2017-01-15: 65.1 mg via INTRAVENOUS

## 2017-01-15 MED ORDER — VERAPAMIL HCL 2.5 MG/ML IV SOLN
INTRA_ARTERIAL | Status: DC | PRN
  Administered 2017-01-15: 15 mL via INTRA_ARTERIAL

## 2017-01-15 MED ORDER — IOPAMIDOL (ISOVUE-370) INJECTION 76%
INTRAVENOUS | Status: DC | PRN
  Administered 2017-01-15: 230 mL via INTRA_ARTERIAL

## 2017-01-15 MED ORDER — HEPARIN SODIUM (PORCINE) 1000 UNIT/ML IJ SOLN
INTRAMUSCULAR | Status: AC
  Filled 2017-01-15: qty 1

## 2017-01-15 MED ORDER — LIDOCAINE HCL (PF) 1 % IJ SOLN
INTRAMUSCULAR | Status: AC
  Filled 2017-01-15: qty 30

## 2017-01-15 MED ORDER — LIDOCAINE HCL (PF) 1 % IJ SOLN
INTRAMUSCULAR | Status: DC | PRN
  Administered 2017-01-15: 2 mL

## 2017-01-15 MED ORDER — ACETAMINOPHEN 325 MG PO TABS: 650.0000 mg | ORAL_TABLET | ORAL | Status: DC | PRN

## 2017-01-15 MED ORDER — NITROGLYCERIN 1 MG/10 ML FOR IR/CATH LAB
INTRA_ARTERIAL | Status: AC
  Filled 2017-01-15: qty 10

## 2017-01-15 MED ORDER — TICAGRELOR 90 MG PO TABS
ORAL_TABLET | ORAL | Status: AC
  Filled 2017-01-15: qty 1

## 2017-01-15 MED ORDER — HEPARIN SODIUM (PORCINE) 1000 UNIT/ML IJ SOLN
INTRAMUSCULAR | Status: DC | PRN
  Administered 2017-01-15: 4500 [IU] via INTRAVENOUS

## 2017-01-15 MED ORDER — HYDRALAZINE HCL 20 MG/ML IJ SOLN
5.0000 mg | INTRAMUSCULAR | Status: AC | PRN
  Administered 2017-01-15: 5 mg via INTRAVENOUS
  Filled 2017-01-15: qty 1

## 2017-01-15 MED ORDER — VERAPAMIL HCL 2.5 MG/ML IV SOLN
INTRAVENOUS | Status: AC
  Filled 2017-01-15: qty 2

## 2017-01-15 MED ORDER — SODIUM CHLORIDE 0.9% FLUSH: 3.0000 mL | INTRAVENOUS | Status: DC | PRN

## 2017-01-15 MED ORDER — ASPIRIN 81 MG PO CHEW: 81.0000 mg | CHEWABLE_TABLET | Freq: Every day | ORAL | Status: DC

## 2017-01-15 MED ORDER — HEART ATTACK BOUNCING BOOK
Freq: Once | Status: AC
  Administered 2017-01-15: 21:00:00
  Filled 2017-01-15: qty 1

## 2017-01-15 MED ORDER — IOPAMIDOL (ISOVUE-370) INJECTION 76%
INTRAVENOUS | Status: AC
  Filled 2017-01-15: qty 200

## 2017-01-15 MED ORDER — HEPARIN (PORCINE) IN NACL 2-0.9 UNIT/ML-% IJ SOLN
INTRAMUSCULAR | Status: DC | PRN
  Administered 2017-01-15: 1000 mL

## 2017-01-15 MED ORDER — SODIUM CHLORIDE 0.9% FLUSH
3.0000 mL | Freq: Two times a day (BID) | INTRAVENOUS | Status: DC
  Administered 2017-01-15: 3 mL via INTRAVENOUS

## 2017-01-15 MED ORDER — ONDANSETRON HCL 4 MG/2ML IJ SOLN: 4.0000 mg | Freq: Four times a day (QID) | INTRAMUSCULAR | Status: DC | PRN

## 2017-01-15 MED ORDER — LABETALOL HCL 5 MG/ML IV SOLN
10.0000 mg | INTRAVENOUS | Status: AC | PRN
  Administered 2017-01-15: 20:00:00 10 mg via INTRAVENOUS
  Filled 2017-01-15: qty 4

## 2017-01-15 MED ORDER — NITROGLYCERIN 1 MG/10 ML FOR IR/CATH LAB
INTRA_ARTERIAL | Status: DC | PRN
  Administered 2017-01-15: 200 ug via INTRACORONARY

## 2017-01-15 MED ORDER — TICAGRELOR 90 MG PO TABS
90.0000 mg | ORAL_TABLET | Freq: Two times a day (BID) | ORAL | Status: DC
  Administered 2017-01-16: 04:00:00 90 mg via ORAL
  Filled 2017-01-15 (×2): qty 1

## 2017-01-15 MED ORDER — MORPHINE SULFATE (PF) 2 MG/ML IV SOLN: 2.0000 mg | INTRAVENOUS | Status: DC | PRN

## 2017-01-15 MED ORDER — SODIUM CHLORIDE 0.9 % IV SOLN: 250.0000 mL | INTRAVENOUS | Status: DC | PRN

## 2017-01-15 MED ORDER — IOPAMIDOL (ISOVUE-370) INJECTION 76%
INTRAVENOUS | Status: AC
  Filled 2017-01-15: qty 100

## 2017-01-15 MED ORDER — SODIUM CHLORIDE 0.9 % IV SOLN
1.7500 mg/kg/h | INTRAVENOUS | Status: AC
  Administered 2017-01-15: 1.75 mg/kg/h via INTRAVENOUS
  Filled 2017-01-15 (×2): qty 250

## 2017-01-15 MED ORDER — SODIUM CHLORIDE 0.9 % IV SOLN
INTRAVENOUS | Status: DC | PRN
  Administered 2017-01-15 (×2): 1.75 mg/kg/h via INTRAVENOUS

## 2017-01-15 SURGICAL SUPPLY — 25 items
BALLN EMERGE MR 2.0X15 (BALLOONS) ×2
BALLOON EMERGE MR 2.0X15 (BALLOONS) ×1 IMPLANT
CATH INFINITI 5 FR JL3.5 (CATHETERS) ×2 IMPLANT
CATH INFINITI 5FR ANG PIGTAIL (CATHETERS) ×2 IMPLANT
CATH OPTITORQUE TIG 4.0 5F (CATHETERS) ×2 IMPLANT
CATH VISTA GUIDE 6FR XBLAD3.5 (CATHETERS) ×2 IMPLANT
DEVICE RAD COMP TR BAND LRG (VASCULAR PRODUCTS) ×2 IMPLANT
DEVICE TORQUE .014-.018 (MISCELLANEOUS) ×1 IMPLANT
GLIDESHEATH SLEND A-KIT 6F 22G (SHEATH) ×2 IMPLANT
GUIDEWIRE INQWIRE 1.5J.035X260 (WIRE) ×2 IMPLANT
INQWIRE 1.5J .035X260CM (WIRE) ×4
KIT ENCORE 26 ADVANTAGE (KITS) ×2 IMPLANT
KIT HEART LEFT (KITS) ×2 IMPLANT
PACK CARDIAC CATHETERIZATION (CUSTOM PROCEDURE TRAY) ×2 IMPLANT
SHEATH PINNACLE 5F 10CM (SHEATH) IMPLANT
STENT SYNERGY DES 2.25X12 (Permanent Stent) ×2 IMPLANT
STENT SYNERGY DES 2.25X16 (Permanent Stent) ×2 IMPLANT
STENT SYNERGY DES 2.25X20 (Permanent Stent) ×2 IMPLANT
STENT SYNERGY DES 2.25X32 (Permanent Stent) ×2 IMPLANT
TORQUE DEVICE .014-.018 (MISCELLANEOUS) ×2
TRANSDUCER W/STOPCOCK (MISCELLANEOUS) ×2 IMPLANT
TUBING CIL FLEX 10 FLL-RA (TUBING) ×2 IMPLANT
WIRE ASAHI PROWATER 180CM (WIRE) ×2 IMPLANT
WIRE EMERALD 3MM-J .035X150CM (WIRE) IMPLANT
WIRE HI TORQ VERSACORE J 260CM (WIRE) ×2 IMPLANT

## 2017-01-15 NOTE — Research (Signed)
OPTIMIZE Informed Consent   Subject Name: MICAHEL OMLOR  Subject met inclusion and exclusion criteria.  The informed consent form, study requirements and expectations were reviewed with the subject and questions and concerns were addressed prior to the signing of the consent form.  The subject verbalized understanding of the trail requirements.  The subject agreed to participate in the OPTIMIZE trial and signed the informed consent.  The informed consent was obtained prior to performance of any protocol-specific procedures for the subject.  A copy of the signed informed consent was given to the subject and a copy was placed in the subject's medical record. Only applicable if Randomized in Research Study.  [name],Brock Larmon W 01/15/2017, [id number]

## 2017-01-15 NOTE — Care Management Note (Signed)
Case Management Note  Patient Details  Name: Shawn Lopez MRN: [id number] Date of Birth: 03/05/1941  Subjective/Objective:  From home , s/p coronary stent intervention, will be on brilinta.                   Action/Plan: NCM will follow for dc needs. Awaiting benefit check for brilinta.  Expected Discharge Date:                  Expected Discharge Plan:     In-House Referral:     Discharge planning Services  CM Consult  Post Acute Care Choice:    Choice offered to:     DME Arranged:    DME Agency:     HH Arranged:    HH Agency:     Status of Service:  In process, will continue to follow  If discussed at Long Length of Stay Meetings, dates discussed:    Additional Comments:  Leone Haven, RN 01/15/2017, 6:38 PM

## 2017-01-15 NOTE — H&P (View-Only) (Signed)
 Progress Note  Patient Name: Shawn Lopez Date of Encounter: 01/15/2017  Primary Cardiologist: New to [facility] - Follow-up in [place], [place]  Subjective   No recurrent pain along his left arm. Denies any chest pain or dyspnea. Had an episode of palpitations overnight with no associated symptoms.   Inpatient Medications    Scheduled Meds: . [START ON 01/16/2017] aspirin EC  81 mg Oral Daily  . atorvastatin  80 mg Oral q1800  . insulin aspart  0-9 Units Subcutaneous TID WC  . lisinopril  2.5 mg Oral Daily  . metoprolol tartrate  12.5 mg Oral BID  . sodium chloride flush  3 mL Intravenous Q12H   Continuous Infusions: . sodium chloride    . sodium chloride    . heparin 1,100 Units/hr (01/14/17 1226)   PRN Meds: sodium chloride, acetaminophen, hydrALAZINE, nitroGLYCERIN, ondansetron (ZOFRAN) IV, sodium chloride flush   Vital Signs    Vitals:   01/14/17 1451 01/14/17 2033 01/15/17 0436 01/15/17 0747  BP: 136/73 (!) 156/85 (!) 144/68 (!) 135/91  Pulse: (!) 55 (!) 59 (!) 54 (!) 55  Resp:  18 16   Temp: 98.1 F (36.7 C) 98.1 F (36.7 C) 98.2 F (36.8 C)   TempSrc: Oral     SpO2: 95% 95% 95%   Weight:   191 lb 6.4 oz (86.8 kg)   Height:        Intake/Output Summary (Last 24 hours) at 01/15/17 0811 Last data filed at 01/15/17 0500  Gross per 24 hour  Intake          1236.09 ml  Output                0 ml  Net          1236.09 ml   Filed Weights   01/13/17 2155 01/14/17 0513 01/15/17 0436  Weight: 195 lb 8 oz (88.7 kg) 196 lb (88.9 kg) 191 lb 6.4 oz (86.8 kg)    Telemetry    NSR, HR in mid-40's to 50's. Occasional PAC's.  - Personally Reviewed  ECG    No new tracings.   Physical Exam   General: Well developed, well nourished Caucasian male appearing in no acute distress. Head: Normocephalic, atraumatic.  Neck: Supple without bruits, JVD not elevated. Lungs:  Resp regular and unlabored, CTA without wheezing or rales. Heart: RRR with occasional ectopic  beats, S1, S2, no S3, S4, or murmur; no rub. Abdomen: Soft, non-tender, non-distended with normoactive bowel sounds. No hepatomegaly. No rebound/guarding. No obvious abdominal masses. Extremities: No clubbing, cyanosis, or lower extremity edema. Distal pedal pulses are 2+ bilaterally. Neuro: Alert and oriented X 3. Moves all extremities spontaneously. Psych: Normal affect.  Labs    Chemistry Recent Labs Lab 01/13/17 0954 01/15/17 0702  NA 135 134*  K 4.0 4.0  CL 101 102  CO2 24 24  GLUCOSE 166* 162*  BUN 17 13  CREATININE 0.80 0.69  CALCIUM 9.2 9.0  GFRNONAA >60 >60  GFRAA >60 >60  ANIONGAP 10 8     Hematology Recent Labs Lab 01/13/17 0954 01/14/17 0216 01/15/17 0702  WBC 7.1 8.9 7.1  RBC 4.88 4.84 5.07  HGB 14.7 14.4 15.0  HCT 44.2 43.8 45.5  MCV 90.6 90.5 89.7  MCH 30.1 29.8 29.6  MCHC 33.3 32.9 33.0  RDW 13.2 13.3 13.3  PLT 172 172 171    Cardiac Enzymes Recent Labs Lab 01/13/17 1810 01/14/17 0808 01/14/17 1434 01/14/17 1938  TROPONINI 0.37* 0.14* 0.15*   0.15*    Recent Labs Lab 01/13/17 0952  TROPIPOC 0.00     BNPNo results for input(s): BNP, PROBNP in the last 168 hours.   DDimer No results for input(s): DDIMER in the last 168 hours.   Radiology    Dg Chest 2 View  Result Date: 01/13/2017 CLINICAL DATA:  75-year-old male with history of cardiac arrhythmias and new onset left arm pain EXAM: CHEST  2 VIEW COMPARISON:  Prior chest x-ray 12/13/2012 ; lumbar spine radiographs 10/09/2014 FINDINGS: The lungs are clear and negative for focal airspace consolidation, pulmonary edema or suspicious pulmonary nodule. No pleural effusion or pneumothorax. Cardiac and mediastinal contours are within normal limits. The thoracic aorta is mildly tortuous. No acute fracture or lytic or blastic osseous lesions. Stable T12 compression fracture. A compression fracture the superior endplate of L1 is new compared to 2014 but unchanged compared to 2016. The visualized  upper abdominal bowel gas pattern is unremarkable. IMPRESSION: No active cardiopulmonary disease. Stable remote T12 and L1 compression fractures. Electronically Signed   By: Heath  McCullough M.D.   On: 01/13/2017 10:41    Cardiac Studies   Echocardiogram: 01/14/2017 Study Conclusions  - Left ventricle: The cavity size was normal. There was mild focal   basal hypertrophy of the septum. Systolic function was normal.   The estimated ejection fraction was in the range of 55% to 60%.   Wall motion was normal; there were no regional wall motion   abnormalities. Doppler parameters are consistent with abnormal   left ventricular relaxation (grade 1 diastolic dysfunction).   Doppler parameters are consistent with high ventricular filling   pressure. - Aortic valve: There was trivial regurgitation. - Mitral valve: Calcified annulus. Mildly thickened leaflets . - Left atrium: The atrium was moderately dilated. - Atrial septum: There was increased thickness of the septum,   consistent with lipomatous hypertrophy. - Pulmonary arteries: Systolic pressure was mildly increased. PA   peak pressure: 32 mm Hg (S).  Impressions:  - Normal LV systolic function; grade 1 diastolic dysfunction with   elevated LV filling pressure; trace AI; moderate LAE; mild TR   with mildly elevated pulmonary pressure.   Patient Profile     75 y.o. male with PMH of HTN, HLD, Type 2 DM, remote history of PAF (occurring in 2006), and tobacco use who presented as a transfer to [place] on 01/13/2017 for evaluation of chest pain and left arm pain. Found to have elevated troponin values up to 0.37.  Assessment & Plan    1. NSTEMI - presented to [place] as a transfer from [place] after presenting for evaluation of left arm pain and palpitations. He denies any recent chest pain. Symptoms resolved after administration of 1 SL NTG.  - cyclic troponin values have been at 0.33, 0.37, 0.14, 0.15, and 0.15. - echo  shows a preserved EF of 55-60% with no regional WMA.  - with his multiple risk factors (HTN, HLD, Type 2 DM, former tobacco use, and family history of CAD), presenting symptoms, and elevated cardiac enzymes, a cardiac catheterization has been recommended for definitive evaluation. Procedure scheduled for today. The patient understands that risks include but are not limited to stroke (1 in 1000), death (1 in 1000), kidney failure [usually temporary] (1 in 500), bleeding (1 in 200), allergic reaction [possibly serious] (1 in 200).  - Continue ASA, statin, BB (monitor on telemetry as HR is in mid-40's to 50's --> may need to discontinue), ACE-I, and Heparin.     2. HTN - BP of 192/88 at presentation, now improved to 135/91. - Lopressor reduced to 12.5mg BID in the setting of bradycardia. Continue low-dose Lisinopril 2.5mg daily. May need to further titrate following cath.   3. Palpitations - patient reports having episodes of intermittent palpitations for many months. Symptoms somewhat similar to prior episode of atrial fibrillation in 2006. Telemetry shows frequent PAC's but no atrial fibrillation/flutter. Consider 30-day event monitor as an outpatient if no significant arrhythmias noted on telemetry.   4. Type 2 DM - per admitting team  5. HLD - Lipid Panel shows total cholesterol 123, HDL 36, and LDL at 55. - continue Atorvastatin 80mg daily.     Signed, Brittany M Strader , PA-C 8:11 AM 01/15/2017 Pager: [phone] Patient seen and examined and history reviewed. Agree with above findings and plan. No current chest pain. All cardiac enzymes and Ecg are normal. Echo shows normal LV function. No effusion. No significant arrhythmia. Exam is benign. No murmur. Lungs are clear. Pulses 2+ throughout.  Plan to proceed with cardiac cath today based on symptoms and risk factors. Questions answered. Antihypertensive medication adjusted as noted above. Continue statin therapy.  Shawn Lopez,  MDFACC 01/15/2017 9:34 AM    

## 2017-01-15 NOTE — Progress Notes (Signed)
Progress Note  Patient Name: Shawn Lopez Date of Encounter: 01/15/2017  Primary Cardiologist: New to [facility] - Follow-up in [place], [place]  Subjective   No recurrent pain along his left arm. Denies any chest pain or dyspnea. Had an episode of palpitations overnight with no associated symptoms.   Inpatient Medications    Scheduled Meds: . [START ON 01/16/2017] aspirin EC  81 mg Oral Daily  . atorvastatin  80 mg Oral q1800  . insulin aspart  0-9 Units Subcutaneous TID WC  . lisinopril  2.5 mg Oral Daily  . metoprolol tartrate  12.5 mg Oral BID  . sodium chloride flush  3 mL Intravenous Q12H   Continuous Infusions: . sodium chloride    . sodium chloride    . heparin 1,100 Units/hr (01/14/17 1226)   PRN Meds: sodium chloride, acetaminophen, hydrALAZINE, nitroGLYCERIN, ondansetron (ZOFRAN) IV, sodium chloride flush   Vital Signs    Vitals:   01/14/17 1451 01/14/17 2033 01/15/17 0436 01/15/17 0747  BP: 136/73 (!) 156/85 (!) 144/68 (!) 135/91  Pulse: (!) 55 (!) 59 (!) 54 (!) 55  Resp:  18 16   Temp: 98.1 F (36.7 C) 98.1 F (36.7 C) 98.2 F (36.8 C)   TempSrc: Oral     SpO2: 95% 95% 95%   Weight:   191 lb 6.4 oz (86.8 kg)   Height:        Intake/Output Summary (Last 24 hours) at 01/15/17 0811 Last data filed at 01/15/17 0500  Gross per 24 hour  Intake          1236.09 ml  Output                0 ml  Net          1236.09 ml   Filed Weights   01/13/17 2155 01/14/17 0513 01/15/17 0436  Weight: 195 lb 8 oz (88.7 kg) 196 lb (88.9 kg) 191 lb 6.4 oz (86.8 kg)    Telemetry    NSR, HR in mid-40's to 50's. Occasional PAC's.  - Personally Reviewed  ECG    No new tracings.   Physical Exam   General: Well developed, well nourished Caucasian male appearing in no acute distress. Head: Normocephalic, atraumatic.  Neck: Supple without bruits, JVD not elevated. Lungs:  Resp regular and unlabored, CTA without wheezing or rales. Heart: RRR with occasional ectopic  beats, S1, S2, no S3, S4, or murmur; no rub. Abdomen: Soft, non-tender, non-distended with normoactive bowel sounds. No hepatomegaly. No rebound/guarding. No obvious abdominal masses. Extremities: No clubbing, cyanosis, or lower extremity edema. Distal pedal pulses are 2+ bilaterally. Neuro: Alert and oriented X 3. Moves all extremities spontaneously. Psych: Normal affect.  Labs    Chemistry Recent Labs Lab 01/13/17 0954 01/15/17 0702  NA 135 134*  K 4.0 4.0  CL 101 102  CO2 24 24  GLUCOSE 166* 162*  BUN 17 13  CREATININE 0.80 0.69  CALCIUM 9.2 9.0  GFRNONAA >60 >60  GFRAA >60 >60  ANIONGAP 10 8     Hematology Recent Labs Lab 01/13/17 0954 01/14/17 0216 01/15/17 0702  WBC 7.1 8.9 7.1  RBC 4.88 4.84 5.07  HGB 14.7 14.4 15.0  HCT 44.2 43.8 45.5  MCV 90.6 90.5 89.7  MCH 30.1 29.8 29.6  MCHC 33.3 32.9 33.0  RDW 13.2 13.3 13.3  PLT 172 172 171    Cardiac Enzymes Recent Labs Lab 01/13/17 1810 01/14/17 0808 01/14/17 1434 01/14/17 1938  TROPONINI 0.37* 0.14* 0.15*  0.15*    Recent Labs Lab 01/13/17 0952  TROPIPOC 0.00     BNPNo results for input(s): BNP, PROBNP in the last 168 hours.   DDimer No results for input(s): DDIMER in the last 168 hours.   Radiology    Dg Chest 2 View  Result Date: 01/13/2017 CLINICAL DATA:  76 year old male with history of cardiac arrhythmias and new onset left arm pain EXAM: CHEST  2 VIEW COMPARISON:  Prior chest x-ray 12/13/2012 ; lumbar spine radiographs 10/09/2014 FINDINGS: The lungs are clear and negative for focal airspace consolidation, pulmonary edema or suspicious pulmonary nodule. No pleural effusion or pneumothorax. Cardiac and mediastinal contours are within normal limits. The thoracic aorta is mildly tortuous. No acute fracture or lytic or blastic osseous lesions. Stable T12 compression fracture. A compression fracture the superior endplate of L1 is new compared to 2014 but unchanged compared to 2016. The visualized  upper abdominal bowel gas pattern is unremarkable. IMPRESSION: No active cardiopulmonary disease. Stable remote T12 and L1 compression fractures. Electronically Signed   By: Malachy Moan M.D.   On: 01/13/2017 10:41    Cardiac Studies   Echocardiogram: 01/14/2017 Study Conclusions  - Left ventricle: The cavity size was normal. There was mild focal   basal hypertrophy of the septum. Systolic function was normal.   The estimated ejection fraction was in the range of 55% to 60%.   Wall motion was normal; there were no regional wall motion   abnormalities. Doppler parameters are consistent with abnormal   left ventricular relaxation (grade 1 diastolic dysfunction).   Doppler parameters are consistent with high ventricular filling   pressure. - Aortic valve: There was trivial regurgitation. - Mitral valve: Calcified annulus. Mildly thickened leaflets . - Left atrium: The atrium was moderately dilated. - Atrial septum: There was increased thickness of the septum,   consistent with lipomatous hypertrophy. - Pulmonary arteries: Systolic pressure was mildly increased. PA   peak pressure: 32 mm Hg (S).  Impressions:  - Normal LV systolic function; grade 1 diastolic dysfunction with   elevated LV filling pressure; trace AI; moderate LAE; mild TR   with mildly elevated pulmonary pressure.   Patient Profile     76 y.o. male with PMH of HTN, HLD, Type 2 DM, remote history of PAF (occurring in 2006), and tobacco use who presented as a transfer to [facility] on 01/13/2017 for evaluation of chest pain and left arm pain. Found to have elevated troponin values up to 0.37.  Assessment & Plan    1. NSTEMI - presented to [facility] as a transfer from [facility] after presenting for evaluation of left arm pain and palpitations. He denies any recent chest pain. Symptoms resolved after administration of 1 SL NTG.  - cyclic troponin values have been at 0.33, 0.37, 0.14, 0.15, and 0.15. - echo  shows a preserved EF of 55-60% with no regional WMA.  - with his multiple risk factors (HTN, HLD, Type 2 DM, former tobacco use, and family history of CAD), presenting symptoms, and elevated cardiac enzymes, a cardiac catheterization has been recommended for definitive evaluation. Procedure scheduled for today. The patient understands that risks include but are not limited to stroke (1 in 1000), death (1 in 1000), kidney failure [usually temporary] (1 in 500), bleeding (1 in 200), allergic reaction [possibly serious] (1 in 200).  - Continue ASA, statin, BB (monitor on telemetry as HR is in mid-40's to 50's --> may need to discontinue), ACE-I, and Heparin.  2. HTN - BP of 192/88 at presentation, now improved to 135/91. - Lopressor reduced to 12.5mg  BID in the setting of bradycardia. Continue low-dose Lisinopril 2.5mg  daily. May need to further titrate following cath.   3. Palpitations - patient reports having episodes of intermittent palpitations for many months. Symptoms somewhat similar to prior episode of atrial fibrillation in 2006. Telemetry shows frequent PAC's but no atrial fibrillation/flutter. Consider 30-day event monitor as an outpatient if no significant arrhythmias noted on telemetry.   4. Type 2 DM - per admitting team  5. HLD - Lipid Panel shows total cholesterol 123, HDL 36, and LDL at 55. - continue Atorvastatin  daily.     Signed, Ellsworth Lennox , PA-C 8:11 AM 01/15/2017 Pager: [phone] Patient seen and examined and history reviewed. Agree with above findings and plan. No current chest pain. All cardiac enzymes and Ecg are normal. Echo shows normal LV function. No effusion. No significant arrhythmia. Exam is benign. No murmur. Lungs are clear. Pulses 2+ throughout.  Plan to proceed with cardiac cath today based on symptoms and risk factors. Questions answered. Antihypertensive medication adjusted as noted above. Continue statin therapy.  Peter [place],  MDFACC 01/15/2017 9:34 AM

## 2017-01-15 NOTE — Progress Notes (Signed)
ANTICOAGULATION CONSULT NOTE - Follow-up Consult  Pharmacy Consult for HEPARIN Indication: chest pain/ACS  No Known Allergies  Patient Measurements: Height:  (177.8 cm) Weight: 191 lb 6.4 oz (86.8 kg) IBW/kg (Calculated) : 73  HEPARIN DW (KG): 88.7  Vital Signs: Temp: 98.2 F (36.8 C) (04/23 0436) BP: 135/91 (04/23 0747) Pulse Rate: 55 (04/23 0747)  Labs:  Recent Labs  01/13/17 0954  01/14/17 0216 01/14/17 0808 01/14/17 1434 01/14/17 1938 01/15/17 0702  HGB 14.7  --  14.4  --   --   --  15.0  HCT 44.2  --  43.8  --   --   --  45.5  PLT 172  --  172  --   --   --  171  HEPARINUNFRC  --   --  0.52  --   --   --  0.34  CREATININE 0.80  --   --   --   --   --  0.69  TROPONINI  --   < >  --  0.14* 0.15* 0.15*  --   < > = values in this interval not displayed.  Estimated Creatinine Clearance: 82.4 mL/min (by C-G formula based on SCr of 0.69 mg/dL).   Assessment: 76yo male on heparin for r/o ACS. Heparin level therapeutic on gtt at 1100 units/hr. CBC stable. No bleeding noted.  Goal of Therapy:  Heparin level 0.3-0.7 units/ml Monitor platelets by anticoagulation protocol: Yes   Plan:  Continue heparin at 1100 units/hr Follow up after cath today  Thank you Okey Regal, PharmD [phone] 01/15/2017,8:42 AM

## 2017-01-15 NOTE — Interval H&P Note (Signed)
Cath Lab Visit (complete for each Cath Lab visit)  Clinical Evaluation Leading to the Procedure:   ACS: Yes.    Non-ACS:    Anginal Classification: CCS III  Anti-ischemic medical therapy: No Therapy  Non-Invasive Test Results: No non-invasive testing performed  Prior CABG: No previous CABG      History and Physical Interval Note:  01/15/2017 3:04 PM  Shawn Lopez  has presented today for surgery, with the diagnosis of unstable angina  The various methods of treatment have been discussed with the patient and family. After consideration of risks, benefits and other options for treatment, the patient has consented to  Procedure(s): Left Heart Cath and Coronary Angiography (N/A) as a surgical intervention .  The patient's history has been reviewed, patient examined, no change in status, stable for surgery.  I have reviewed the patient's chart and labs.  Questions were answered to the patient's satisfaction.     Nanetta Batty

## 2017-01-15 NOTE — Progress Notes (Signed)
PROGRESS NOTE        PATIENT DETAILS Name: TOBIN WITUCKI Age: 76 y.o. Sex: male Date of Birth: 1941/01/31 Admit Date: 01/13/2017 Admitting Physician Kathleene Hazel, MD ZOX:[id number] Carlton Adam, MD  Brief Narrative: Patient is a 76 y.o. male with past medical history of diabetes, dyslipidemia, hypertension admitted with evaluation of chest pain. See below for further details  Subjective: No chest pain No chest pain No SOB No headache No Nause.vomiting or diarrhea  Assessment/Plan: Non-STEMI: Chest pain free, troponins minimally elevated-echocardiogram with preserved EF without any wall motion abnormalities. Seen by cardiology, plans are for [facility] today. In the meantime, continue with aspirin, statin, beta blocker and IV heparin.  DM-2: CBGs stable with SSI, resume oral hypoglycemic agents on discharge.  Hypertension: Currently controlled with metoprolol, lisinopril  Dyslipidemia: Continue statin. LDL on 12/2353.  DVT Prophylaxis: Full dose anticoagulation with Heparin  Code Status: Full code   Family Communication: Spouse at bedside  Disposition Plan: Remain inpatient-home once workup is complete. Suspect in the next day or so  Antimicrobial agents: Anti-infectives    None      Procedures: Echo 4/22>> - Left ventricle: The cavity size was normal. There was mild focal   basal hypertrophy of the septum. Systolic function was normal.   The estimated ejection fraction was in the range of 55% to 60%.   Wall motion was normal; there were no regional wall motion   abnormalities. Doppler parameters are consistent with abnormal   left ventricular relaxation (grade 1 diastolic dysfunction).   Doppler parameters are consistent with high ventricular filling   pressure. - Aortic valve: There was trivial regurgitation. - Mitral valve: Calcified annulus. Mildly thickened leaflets . - Left atrium: The atrium was moderately dilated. -  Atrial septum: There was increased thickness of the septum,   consistent with lipomatous hypertrophy. - Pulmonary arteries: Systolic pressure was mildly increased. PA   peak pressure: 32 mm Hg (S).  CONSULTS:  cardiology  Time spent: 25- minutes-Greater than 50% of this time was spent in counseling, explanation of diagnosis, planning of further management, and coordination of care.  MEDICATIONS: Scheduled Meds: . [START ON 01/16/2017] aspirin EC  81 mg Oral Daily  . atorvastatin  80 mg Oral q1800  . insulin aspart  0-9 Units Subcutaneous TID WC  . lisinopril  2.5 mg Oral Daily  . metoprolol tartrate  12.5 mg Oral BID  . sodium chloride flush  3 mL Intravenous Q12H   Continuous Infusions: . sodium chloride    . sodium chloride 1 mL/kg/hr (01/15/17 1300)  . heparin 1,100 Units/hr (01/15/17 1300)   PRN Meds:.sodium chloride, acetaminophen, hydrALAZINE, nitroGLYCERIN, ondansetron (ZOFRAN) IV, sodium chloride flush   PHYSICAL EXAM: Vital signs: Vitals:   01/14/17 1451 01/14/17 2033 01/15/17 0436 01/15/17 0747  BP: 136/73 (!) 156/85 (!) 144/68 (!) 135/91  Pulse: (!) 55 (!) 59 (!) 54 (!) 55  Resp:  18 16   Temp: 98.1 F (36.7 C) 98.1 F (36.7 C) 98.2 F (36.8 C)   TempSrc: Oral     SpO2: 95% 95% 95%   Weight:   86.8 kg (191 lb 6.4 oz)   Height:       Filed Weights   01/13/17 2155 01/14/17 0513 01/15/17 0436  Weight: 88.7 kg (195 lb 8 oz) 88.9 kg (196 lb) 86.8 kg (191 lb 6.4 oz)  Body mass index is 27.46 kg/m.   General appearance :Awake, alert, not in any distress. Eyes:, pupils equally reactive to light and accomodation,no scleral icterus. HEENT: Atraumatic and Normocephalic Neck: supple, no JVD. No cervical lymphadenopathy. Resp:Good air entry bilaterally, no added sounds  CVS: S1 S2 regular, no murmurs.  GI: Bowel sounds present, Non tender and not distended with no gaurding, rigidity or rebound.No organomegaly Extremities: B/L Lower Ext shows no edema, both legs  are warm to touch Neurology:  speech clear,Non focal, sensation is grossly intact. Psychiatric: Normal judgment and insight. Alert and oriented x 3. Normal mood. Musculoskeletal:No digital cyanosis Skin:No Rash, warm and dry Wounds:N/A  I have personally reviewed following labs and imaging studies  LABORATORY DATA: CBC:  Recent Labs Lab 01/13/17 0954 01/14/17 0216 01/15/17 0702  WBC 7.1 8.9 7.1  NEUTROABS 4.6  --   --   HGB 14.7 14.4 15.0  HCT 44.2 43.8 45.5  MCV 90.6 90.5 89.7  PLT 172 172 171    Basic Metabolic Panel:  Recent Labs Lab 01/13/17 0954 01/15/17 0702  NA 135 134*  K 4.0 4.0  CL 101 102  CO2 24 24  GLUCOSE 166* 162*  BUN 17 13  CREATININE 0.80 0.69  CALCIUM 9.2 9.0  MG  --  2.2    GFR: Estimated Creatinine Clearance: 82.4 mL/min (by C-G formula based on SCr of 0.69 mg/dL).  Liver Function Tests: No results for input(s): AST, ALT, ALKPHOS, BILITOT, PROT, ALBUMIN in the last 168 hours. No results for input(s): LIPASE, AMYLASE in the last 168 hours. No results for input(s): AMMONIA in the last 168 hours.  Coagulation Profile:  Recent Labs Lab 01/15/17 1147  INR 1.01    Cardiac Enzymes:  Recent Labs Lab 01/13/17 1551 01/13/17 1810 01/14/17 0808 01/14/17 1434 01/14/17 1938  TROPONINI 0.33* 0.37* 0.14* 0.15* 0.15*    BNP (last 3 results) No results for input(s): PROBNP in the last 8760 hours.  HbA1C: No results for input(s): HGBA1C in the last 72 hours.  CBG:  Recent Labs Lab 01/14/17 1624 01/14/17 1903 01/14/17 2036 01/15/17 0731 01/15/17 1116  GLUCAP 139* 184* 171* 137* 123*    Lipid Profile:  Recent Labs  01/15/17 0702  CHOL 123  HDL 36*  LDLCALC 55  TRIG 161*  CHOLHDL 3.4    Thyroid Function Tests: No results for input(s): TSH, T4TOTAL, FREET4, T3FREE, THYROIDAB in the last 72 hours.  Anemia Panel: No results for input(s): VITAMINB12, FOLATE, FERRITIN, TIBC, IRON, RETICCTPCT in the last 72  hours.  Urine analysis:    Component Value Date/Time   COLORURINE YELLOW 12/19/2008 1117   APPEARANCEUR CLOUDY (A) 12/19/2008 1117   LABSPEC 1.015 12/19/2008 1117   PHURINE 6.5 12/19/2008 1117   GLUCOSEU NEGATIVE 12/19/2008 1117   HGBUR TRACE (A) 12/19/2008 1117   BILIRUBINUR NEGATIVE 12/19/2008 1117   KETONESUR NEGATIVE 12/19/2008 1117   PROTEINUR NEGATIVE 12/19/2008 1117   UROBILINOGEN 0.2 12/19/2008 1117   NITRITE POSITIVE (A) 12/19/2008 1117   LEUKOCYTESUR SMALL (A) 12/19/2008 1117    Sepsis Labs: Lactic Acid, Venous No results found for: LATICACIDVEN  MICROBIOLOGY: No results found for this or any previous visit (from the past 240 hour(s)).  RADIOLOGY STUDIES/RESULTS: Dg Chest 2 View  Result Date: 01/13/2017 CLINICAL DATA:  76 year old male with history of cardiac arrhythmias and new onset left arm pain EXAM: CHEST  2 VIEW COMPARISON:  Prior chest x-ray 12/13/2012 ; lumbar spine radiographs 10/09/2014 FINDINGS: The lungs are clear and negative for  focal airspace consolidation, pulmonary edema or suspicious pulmonary nodule. No pleural effusion or pneumothorax. Cardiac and mediastinal contours are within normal limits. The thoracic aorta is mildly tortuous. No acute fracture or lytic or blastic osseous lesions. Stable T12 compression fracture. A compression fracture the superior endplate of L1 is new compared to 2014 but unchanged compared to 2016. The visualized upper abdominal bowel gas pattern is unremarkable. IMPRESSION: No active cardiopulmonary disease. Stable remote T12 and L1 compression fractures. Electronically Signed   By: Malachy Moan M.D.   On: 01/13/2017 10:41   Dg Lumbar Spine Complete  Addendum Date: 01/12/2017   ADDENDUM REPORT: 01/12/2017 10:08 ADDENDUM: CORRECTION:  LUMBAR SPINE COMPLETE REASON FOR EXAMINATION:  Low back pain.  Sharp right flank pain. COMPARISON:  10/09/2014 FINDINGS: There are old stable compression deformities of T12 and L1. The disc  spaces are maintained throughout the lumbar spine. Slight bilateral facet arthritis at L4-5 and L5-S1, stable. Slight retrolisthesis of L1 on L2, unchanged. Scattered calcifications in the abdominal aorta and iliac arteries. IMPRESSION: No acute abnormalities of the lumbar spine. No change since 10/09/2014. 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 Electronically Signed   By: Francene Boyers M.D.   On: 01/12/2017 10:08   Result Date: 01/12/2017 CLINICAL DATA:  Low back pain.  Sharp right flank pain. EXAM: RENAL / URINARY TRACT ULTRASOUND COMPLETE COMPARISON:  10/09/2014 FINDINGS: Right Kidney: Length: 10.9 cm. Echogenicity within normal limits. No mass or hydronephrosis visualized. Left Kidney: Length: 11.4 cm. Echogenicity within normal limits. No mass or hydronephrosis visualized. Bladder: Appears normal for degree of bladder distention. IMPRESSION: Normal exam. Specifically, no evidence of hydronephrosis. No visible renal or bladder calculi. Electronically Signed: By: Francene Boyers M.D. On: 01/05/2017 15:34   US Renal  Addendum Date: 01/12/2017   ADDENDUM REPORT: 01/12/2017 10:08 ADDENDUM: CORRECTION:  LUMBAR SPINE COMPLETE REASON FOR EXAMINATION:  Low back pain.  Sharp right flank pain. COMPARISON:  10/09/2014 FINDINGS: There are old stable compression deformities of T12 and L1. The disc spaces are maintained throughout the lumbar spine. Slight bilateral facet arthritis at L4-5 and L5-S1, stable. Slight retrolisthesis of L1 on L2, unchanged. Scattered calcifications in the abdominal aorta and iliac arteries. IMPRESSION: No acute abnormalities of the lumbar spine. No change since 10/09/2014. 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 Electronically Signed   By: Francene Boyers M.D.   On: 01/12/2017 10:08   Result Date: 01/12/2017 CLINICAL DATA:  Low back pain.  Sharp right flank pain. EXAM: RENAL / URINARY TRACT ULTRASOUND COMPLETE  COMPARISON:  10/09/2014 FINDINGS: Right Kidney: Length: 10.9 cm. Echogenicity within normal limits. No mass or hydronephrosis visualized. Left Kidney: Length: 11.4 cm. Echogenicity within normal limits. No mass or hydronephrosis visualized. Bladder: Appears normal for degree of bladder distention. IMPRESSION: Normal exam. Specifically, no evidence of hydronephrosis. No visible renal or bladder calculi. Electronically Signed: By: Francene Boyers M.D. On: 01/05/2017 15:34     LOS: 1 day   Jeoffrey Massed, MD  [facility] Pager:[phone]  If 7PM-7AM, please contact night-coverage [email or web address] Password [facility] 01/15/2017, 1:37 PM

## 2017-01-16 ENCOUNTER — Encounter (HOSPITAL_COMMUNITY): Payer: Self-pay | Admitting: Cardiovascular Disease

## 2017-01-16 LAB — CBC
HEMATOCRIT: 44.7 % (ref 39.0–52.0)
HEMOGLOBIN: 15 g/dL (ref 13.0–17.0)
MCH: 29.8 pg (ref 26.0–34.0)
MCHC: 33.6 g/dL (ref 30.0–36.0)
MCV: 88.7 fL (ref 78.0–100.0)
Platelets: 184 10*3/uL (ref 150–400)
RBC: 5.04 MIL/uL (ref 4.22–5.81)
RDW: 13.4 % (ref 11.5–15.5)
WBC: 11.1 10*3/uL — ABNORMAL HIGH (ref 4.0–10.5)

## 2017-01-16 LAB — BASIC METABOLIC PANEL
ANION GAP: 10 (ref 5–15)
BUN: 10 mg/dL (ref 6–20)
CALCIUM: 9.5 mg/dL (ref 8.9–10.3)
CHLORIDE: 104 mmol/L (ref 101–111)
CO2: 21 mmol/L — ABNORMAL LOW (ref 22–32)
CREATININE: 0.74 mg/dL (ref 0.61–1.24)
Glucose, Bld: 164 mg/dL — ABNORMAL HIGH (ref 65–99)
Potassium: 3.8 mmol/L (ref 3.5–5.1)
Sodium: 135 mmol/L (ref 135–145)

## 2017-01-16 LAB — GLUCOSE, CAPILLARY: GLUCOSE-CAPILLARY: 173 mg/dL — AB (ref 65–99)

## 2017-01-16 MED ORDER — METOPROLOL TARTRATE 25 MG PO TABS: 25.0000 mg | ORAL_TABLET | Freq: Two times a day (BID) | ORAL | Status: DC

## 2017-01-16 MED ORDER — LISINOPRIL 2.5 MG PO TABS: 20.0000 mg | ORAL_TABLET | Freq: Every day | ORAL | 0 refills | Status: DC

## 2017-01-16 MED ORDER — METFORMIN HCL 500 MG PO TABS: 1000.0000 mg | ORAL_TABLET | Freq: Two times a day (BID) | ORAL | Status: DC

## 2017-01-16 MED ORDER — TICAGRELOR 90 MG PO TABS: 90.0000 mg | ORAL_TABLET | Freq: Two times a day (BID) | ORAL | 0 refills | Status: DC

## 2017-01-16 MED ORDER — LISINOPRIL 5 MG PO TABS
5.0000 mg | ORAL_TABLET | Freq: Every day | ORAL | Status: DC
  Administered 2017-01-16: 5 mg via ORAL

## 2017-01-16 MED ORDER — ATORVASTATIN CALCIUM 80 MG PO TABS: 80.0000 mg | ORAL_TABLET | Freq: Every day | ORAL | 0 refills | Status: DC

## 2017-01-16 MED ORDER — NITROGLYCERIN 0.4 MG SL SUBL: 0.4000 mg | SUBLINGUAL_TABLET | SUBLINGUAL | 0 refills | Status: DC | PRN

## 2017-01-16 MED ORDER — LISINOPRIL 10 MG PO TABS: 20.0000 mg | ORAL_TABLET | Freq: Every day | ORAL | Status: DC

## 2017-01-16 MED ORDER — ASPIRIN 81 MG PO TBEC: 81.0000 mg | DELAYED_RELEASE_TABLET | Freq: Every day | ORAL | 0 refills | Status: DC

## 2017-01-16 MED ORDER — LISINOPRIL 5 MG PO TABS: 5.0000 mg | ORAL_TABLET | Freq: Every day | ORAL | Status: DC

## 2017-01-16 MED ORDER — LISINOPRIL 5 MG PO TABS: 5.0000 mg | ORAL_TABLET | Freq: Every day | ORAL | 0 refills | Status: DC

## 2017-01-16 NOTE — Progress Notes (Signed)
CARDIAC REHAB PHASE I   PRE:  Rate/Rhythm: 68 SR  BP:  Supine: 172/79  Sitting:   Standing:    SaO2: 99%RA  MODE:  Ambulation: 600 ft   POST:  Rate/Rhythm: 72 SR  BP:  Supine:   Sitting: 188/78, 181/70  Standing:    SaO2: 98%RA 0805-0908 Pt walked 600 ft on RA with steady gait. Tolerated well. No increased SOB. MI education completed with pt who voiced understanding. Stressed importance of brilinta with stent. Pt has been having sudden SOB which is relieved with diet coke. Will need to see case manager if he stays on brilinta. Reviewed NTG use, risk factors, heart healthy and low carb diets, ex ed, MI restrictions. Discussed CRP 2 and will refer to [facility]. Pt may only be able to attend on Fridays since he works in [employer].  BP elevated and pt received meds prior to walk for BP.   Luetta Nutting, RN BSN  01/16/2017 9:03 AM

## 2017-01-16 NOTE — Research (Signed)
[employer] Informed Consent   Subject Name: Shawn Lopez  Subject met inclusion and exclusion criteria.  The informed consent form, study requirements and expectations were reviewed with the subject and questions and concerns were addressed prior to the signing of the consent form.  The subject verbalized understanding of the trail requirements.  The subject agreed to participate in the [employer] trial and signed the informed consent.  The informed consent was obtained prior to performance of any protocol-specific procedures for the subject.  A copy of the signed informed consent was given to the subject and a copy was placed in the subject's medical record.  [name],[name] 01/16/2017, 0762

## 2017-01-16 NOTE — Progress Notes (Signed)
TR BAND REMOVAL  LOCATION:    right radial  DEFLATED PER PROTOCOL:    Yes.    TIME BAND OFF / DRESSING APPLIED:    0115   SITE UPON ARRIVAL:    Level 0  SITE AFTER BAND REMOVAL:    Level 0  CIRCULATION SENSATION AND MOVEMENT:    Within Normal Limits   Yes.    COMMENTS:   Radial site teaching done.

## 2017-01-16 NOTE — Discharge Summary (Signed)
PATIENT DETAILS Name: Shawn Lopez Age: 76 y.o. Sex: male Date of Birth: 1941-07-27 MRN: [id number]. Admitting Physician: Kathleene Hazel, MD WUJ:[id number] Carlton Adam, MD  Admit Date: 01/13/2017 Discharge date: 01/16/2017  Recommendations for Outpatient Follow-up:  1. Follow up with PCP in 1-2 weeks 2. Please obtain BMP/CBC in one week 3. Please ensure follow up with cardiology 4. Please consider referral for a sleep study  Admitted From:  Home  Disposition: Home   Home Health: No  Equipment/Devices: None  Discharge Condition: Stable  CODE STATUS: FULL CODE  Diet recommendation:  Heart Healthy / Carb Modified   Brief Summary: See H&P, Labs, Consult and Test reports for all details in brief, Patient is a 76 y.o. male with past medical history of diabetes, dyslipidemia, hypertension admitted with evaluation of chest pain. See below for further details  Brief Hospital Course: Non-STEMI: Chest pain free, troponins minimally elevated-echocardiogram with preserved EF without any wall motion abnormalities. Seen by cardiology, underwent LHC on 4/23 with PCI to the mid LAD and mid first diagonal branch. No further recommendations from cardiology while inpatient-continue dual antiplatelet agents, beta blocker and statin. Outpatient appointment with cardiology has been arranged. today. Chest pain free at the time of discharge  DM-2: CBGs stable with SSI, resume Metformin on 4/25  Hypertension: BP on the higher side this am-have increased dosage of metoprolol and lisinopril Follow with PCP for further optimization. Consider outpatient sleep study  Dyslipidemia: Continue high inte statin. LDL on 12/2353.  Procedures/Studies: LHC 4/23>>  Mid LAD to Dist LAD lesion, 95 %stenosed.  Prox LAD to Mid LAD lesion, 75 %stenosed.  Post intervention, there is a 0% residual stenosis.  A stent was successfully placed.  1st Diag lesion, 75 %stenosed.  Post  intervention, there is a 0% residual stenosis.  A stent was successfully placed.  TTE 4/22>> - Normal LV systolic function; grade 1 diastolic dysfunction with   elevated LV filling pressure; trace AI; moderate LAE; mild TR   with mildly elevated pulmonary pressure.  Discharge Diagnoses:  Principal Problem:   NSTEMI (non-ST elevated myocardial infarction) [facility]) Active Problems:   Hypertension   Hyperlipidemia   Diabetes mellitus (HCC)   Chest pain   Discharge Instructions:  Activity:  As tolerated with Full fall precautions use walker/cane & assistance as needed  Discharge Instructions    AMB Referral to Cardiac Rehabilitation - Phase II    Complete by:  As directed    Diagnosis:  NSTEMI   Amb Referral to Cardiac Rehabilitation    Complete by:  As directed    Diagnosis:   NSTEMI Coronary Stents     Call MD for:  difficulty breathing, headache or visual disturbances    Complete by:  As directed    Diet - low sodium heart healthy    Complete by:  As directed    Diet Carb Modified    Complete by:  As directed    Discharge instructions    Complete by:  As directed    Follow with Primary MD  Milana Obey, MD in 1 week  Please follow with cardiology clinic as instructed  Please ask your Primary MD to refer you for a sleep study  Please get a complete blood count and chemistry panel checked by your Primary MD at your next visit, and again as instructed by your Primary MD.  Get Medicines reviewed and adjusted: Please take all your medications with you for your next visit with your Primary MD  Laboratory/radiological data: Please request your Primary MD to go over all hospital tests and procedure/radiological results at the follow up, please ask your Primary MD to get all Hospital records sent to his/her office.  In some cases, they will be blood work, cultures and biopsy results pending at the time of your discharge. Please request that your primary care M.D.  follows up on these results.  Also Note the following: If you experience worsening of your admission symptoms, develop shortness of breath, life threatening emergency, suicidal or homicidal thoughts you must seek medical attention immediately by calling 911 or calling your MD immediately  if symptoms less severe.  You must read complete instructions/literature along with all the possible adverse reactions/side effects for all the Medicines you take and that have been prescribed to you. Take any new Medicines after you have completely understood and accpet all the possible adverse reactions/side effects.   Do not drive when taking Pain medications or sleeping medications (Benzodaizepines)  Do not take more than prescribed Pain, Sleep and Anxiety Medications. It is not advisable to combine anxiety,sleep and pain medications without talking with your primary care practitioner  Special Instructions: If you have smoked or chewed Tobacco  in the last 2 yrs please stop smoking, stop any regular Alcohol  and or any Recreational drug use.  Wear Seat belts while driving.  Please note: You were cared for by a hospitalist during your hospital stay. Once you are discharged, your primary care physician will handle any further medical issues. Please note that NO REFILLS for any discharge medications will be authorized once you are discharged, as it is imperative that you return to your primary care physician (or establish a relationship with a primary care physician if you do not have one) for your post hospital discharge needs so that they can reassess your need for medications and monitor your lab values.   Increase activity slowly    Complete by:  As directed      Allergies as of 01/16/2017   No Known Allergies     Medication List    STOP taking these medications   doxycycline 100 MG tablet Commonly known as:  VIBRA-TABS   pravastatin 40 MG tablet Commonly known as:  PRAVACHOL     TAKE these  medications   aspirin 81 MG EC tablet Take 1 tablet (81 mg total) by mouth daily. Start taking on:  01/17/2017   atorvastatin 80 MG tablet Commonly known as:  LIPITOR Take 1 tablet (80 mg total) by mouth daily at 6 PM.   lisinopril 5 MG tablet Commonly known as:  PRINIVIL,ZESTRIL Take 1 tablet (5 mg total) by mouth daily. Start taking on:  01/17/2017 What changed:  medication strength  how much to take   metFORMIN 500 MG tablet Commonly known as:  GLUCOPHAGE Take 2 tablets (1,000 mg total) by mouth 2 (two) times daily. Start taking on:  01/17/2017   metoprolol tartrate 25 MG tablet Commonly known as:  LOPRESSOR Take 25 mg by mouth 2 (two) times daily.   nitroGLYCERIN 0.4 MG SL tablet Commonly known as:  NITROSTAT Place 1 tablet (0.4 mg total) under the tongue every 5 (five) minutes as needed for chest pain. For 3 doses only   ticagrelor 90 MG Tabs tablet Commonly known as:  BRILINTA Take 1 tablet (90 mg total) by mouth 2 (two) times daily.      Follow-up Information    Ellsworth Lennox, PA-C Follow up on 01/26/2017.   Specialties:  Physician Assistant, Cardiology Why:  Cardiology Hospital Follow-Up on 01/26/2017 at 3:00PM.  Contact information: [address] [place] [address] [phone]        Milana Obey, MD. Schedule an appointment as soon as possible for a visit in 1 week(s).   Specialty:  Family Medicine Contact information: [address] [place] [address] [phone]          No Known Allergies  Consultations:   cardiology Other Procedures/Studies: Dg Chest 2 View  Result Date: 01/13/2017 CLINICAL DATA:  76 year old male with history of cardiac arrhythmias and new onset left arm pain EXAM: CHEST  2 VIEW COMPARISON:  Prior chest x-ray 12/13/2012 ; lumbar spine radiographs 10/09/2014 FINDINGS: The lungs are clear and negative for focal airspace consolidation, pulmonary edema or suspicious pulmonary nodule. No pleural effusion or  pneumothorax. Cardiac and mediastinal contours are within normal limits. The thoracic aorta is mildly tortuous. No acute fracture or lytic or blastic osseous lesions. Stable T12 compression fracture. A compression fracture the superior endplate of L1 is new compared to 2014 but unchanged compared to 2016. The visualized upper abdominal bowel gas pattern is unremarkable. IMPRESSION: No active cardiopulmonary disease. Stable remote T12 and L1 compression fractures. Electronically Signed   By: Malachy Moan M.D.   On: 01/13/2017 10:41   Dg Lumbar Spine Complete  Addendum Date: 01/12/2017   ADDENDUM REPORT: 01/12/2017 10:08 ADDENDUM: CORRECTION:  LUMBAR SPINE COMPLETE REASON FOR EXAMINATION:  Low back pain.  Sharp right flank pain. COMPARISON:  10/09/2014 FINDINGS: There are old stable compression deformities of T12 and L1. The disc spaces are maintained throughout the lumbar spine. Slight bilateral facet arthritis at L4-5 and L5-S1, stable. Slight retrolisthesis of L1 on L2, unchanged. Scattered calcifications in the abdominal aorta and iliac arteries. IMPRESSION: No acute abnormalities of the lumbar spine. No change since 10/09/2014. 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 Electronically Signed   By: Francene Boyers M.D.   On: 01/12/2017 10:08   Result Date: 01/12/2017 CLINICAL DATA:  Low back pain.  Sharp right flank pain. EXAM: RENAL / URINARY TRACT ULTRASOUND COMPLETE COMPARISON:  10/09/2014 FINDINGS: Right Kidney: Length: 10.9 cm. Echogenicity within normal limits. No mass or hydronephrosis visualized. Left Kidney: Length: 11.4 cm. Echogenicity within normal limits. No mass or hydronephrosis visualized. Bladder: Appears normal for degree of bladder distention. IMPRESSION: Normal exam. Specifically, no evidence of hydronephrosis. No visible renal or bladder calculi. Electronically Signed: By: Francene Boyers M.D. On: 01/05/2017 15:34   US Renal  Addendum Date:  01/12/2017   ADDENDUM REPORT: 01/12/2017 10:08 ADDENDUM: CORRECTION:  LUMBAR SPINE COMPLETE REASON FOR EXAMINATION:  Low back pain.  Sharp right flank pain. COMPARISON:  10/09/2014 FINDINGS: There are old stable compression deformities of T12 and L1. The disc spaces are maintained throughout the lumbar spine. Slight bilateral facet arthritis at L4-5 and L5-S1, stable. Slight retrolisthesis of L1 on L2, unchanged. Scattered calcifications in the abdominal aorta and iliac arteries. IMPRESSION: No acute abnormalities of the lumbar spine. No change since 10/09/2014. 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 Electronically Signed   By: Francene Boyers M.D.   On: 01/12/2017 10:08   Result Date: 01/12/2017 CLINICAL DATA:  Low back pain.  Sharp right flank pain. EXAM: RENAL / URINARY TRACT ULTRASOUND COMPLETE COMPARISON:  10/09/2014 FINDINGS: Right Kidney: Length: 10.9 cm. Echogenicity within normal limits. No mass or hydronephrosis visualized. Left Kidney: Length: 11.4 cm. Echogenicity within normal limits. No mass or  hydronephrosis visualized. Bladder: Appears normal for degree of bladder distention. IMPRESSION: Normal exam. Specifically, no evidence of hydronephrosis. No visible renal or bladder calculi. Electronically Signed: By: Francene Boyers M.D. On: 01/05/2017 15:34     TODAY-DAY OF DISCHARGE:  Subjective:   [name] today has no headache,no chest abdominal pain,no new weakness tingling or numbness, feels much better wants to go home today.   Objective:   Blood pressure (!) 157/66, pulse 60, temperature 97.8 F (36.6 C), temperature source Oral, resp. rate (!) 21, height  (1.778 m), weight 87.8 kg (193 lb 9 oz), SpO2 96 %.  Intake/Output Summary (Last 24 hours) at 01/16/17 1203 Last data filed at 01/16/17 0513  Gross per 24 hour  Intake             1511 ml  Output              700 ml  Net              811 ml   Filed Weights   01/14/17 0513  01/15/17 0436 01/16/17 0500  Weight: 88.9 kg (196 lb) 86.8 kg (191 lb 6.4 oz) 87.8 kg (193 lb 9 oz)    Exam: Awake Alert, Oriented *3, No new F.N deficits, Normal affect [place].AT,PERRAL Supple Neck,No JVD, No cervical lymphadenopathy appriciated.  Symmetrical Chest wall movement, Good air movement bilaterally, CTAB RRR,No Gallops,Rubs or new Murmurs, No Parasternal Heave +ve B.Sounds, Abd Soft, Non tender, No organomegaly appriciated, No rebound -guarding or rigidity. No Cyanosis, Clubbing or edema, No new Rash or bruise   PERTINENT RADIOLOGIC STUDIES: Dg Chest 2 View  Result Date: 01/13/2017 CLINICAL DATA:  76 year old male with history of cardiac arrhythmias and new onset left arm pain EXAM: CHEST  2 VIEW COMPARISON:  Prior chest x-ray 12/13/2012 ; lumbar spine radiographs 10/09/2014 FINDINGS: The lungs are clear and negative for focal airspace consolidation, pulmonary edema or suspicious pulmonary nodule. No pleural effusion or pneumothorax. Cardiac and mediastinal contours are within normal limits. The thoracic aorta is mildly tortuous. No acute fracture or lytic or blastic osseous lesions. Stable T12 compression fracture. A compression fracture the superior endplate of L1 is new compared to 2014 but unchanged compared to 2016. The visualized upper abdominal bowel gas pattern is unremarkable. IMPRESSION: No active cardiopulmonary disease. Stable remote T12 and L1 compression fractures. Electronically Signed   By: Malachy Moan M.D.   On: 01/13/2017 10:41   Dg Lumbar Spine Complete  Addendum Date: 01/12/2017   ADDENDUM REPORT: 01/12/2017 10:08 ADDENDUM: CORRECTION:  LUMBAR SPINE COMPLETE REASON FOR EXAMINATION:  Low back pain.  Sharp right flank pain. COMPARISON:  10/09/2014 FINDINGS: There are old stable compression deformities of T12 and L1. The disc spaces are maintained throughout the lumbar spine. Slight bilateral facet arthritis at L4-5 and L5-S1, stable. Slight retrolisthesis of L1 on  L2, unchanged. Scattered calcifications in the abdominal aorta and iliac arteries. IMPRESSION: No acute abnormalities of the lumbar spine. No change since 10/09/2014. 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 Electronically Signed   By: Francene Boyers M.D.   On: 01/12/2017 10:08   Result Date: 01/12/2017 CLINICAL DATA:  Low back pain.  Sharp right flank pain. EXAM: RENAL / URINARY TRACT ULTRASOUND COMPLETE COMPARISON:  10/09/2014 FINDINGS: Right Kidney: Length: 10.9 cm. Echogenicity within normal limits. No mass or hydronephrosis visualized. Left Kidney: Length: 11.4 cm. Echogenicity within normal limits. No mass or hydronephrosis visualized. Bladder: Appears normal for degree of bladder distention. IMPRESSION:  Normal exam. Specifically, no evidence of hydronephrosis. No visible renal or bladder calculi. Electronically Signed: By: Francene Boyers M.D. On: 01/05/2017 15:34   US Renal  Addendum Date: 01/12/2017   ADDENDUM REPORT: 01/12/2017 10:08 ADDENDUM: CORRECTION:  LUMBAR SPINE COMPLETE REASON FOR EXAMINATION:  Low back pain.  Sharp right flank pain. COMPARISON:  10/09/2014 FINDINGS: There are old stable compression deformities of T12 and L1. The disc spaces are maintained throughout the lumbar spine. Slight bilateral facet arthritis at L4-5 and L5-S1, stable. Slight retrolisthesis of L1 on L2, unchanged. Scattered calcifications in the abdominal aorta and iliac arteries. IMPRESSION: No acute abnormalities of the lumbar spine. No change since 10/09/2014. 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 Electronically Signed   By: Francene Boyers M.D.   On: 01/12/2017 10:08   Result Date: 01/12/2017 CLINICAL DATA:  Low back pain.  Sharp right flank pain. EXAM: RENAL / URINARY TRACT ULTRASOUND COMPLETE COMPARISON:  10/09/2014 FINDINGS: Right Kidney: Length: 10.9 cm. Echogenicity within normal limits. No mass or hydronephrosis visualized. Left  Kidney: Length: 11.4 cm. Echogenicity within normal limits. No mass or hydronephrosis visualized. Bladder: Appears normal for degree of bladder distention. IMPRESSION: Normal exam. Specifically, no evidence of hydronephrosis. No visible renal or bladder calculi. Electronically Signed: By: Francene Boyers M.D. On: 01/05/2017 15:34     PERTINENT LAB RESULTS: CBC:  Recent Labs  01/15/17 0702 01/16/17 0335  WBC 7.1 11.1*  HGB 15.0 15.0  HCT 45.5 44.7  PLT 171 184   CMET CMP     Component Value Date/Time   NA 135 01/16/2017 0335   K 3.8 01/16/2017 0335   CL 104 01/16/2017 0335   CO2 21 (L) 01/16/2017 0335   GLUCOSE 164 (H) 01/16/2017 0335   BUN 10 01/16/2017 0335   CREATININE 0.74 01/16/2017 0335   CALCIUM 9.5 01/16/2017 0335   GFRNONAA >60 01/16/2017 0335   GFRAA >60 01/16/2017 0335    GFR Estimated Creatinine Clearance: 89 mL/min (by C-G formula based on SCr of 0.74 mg/dL). No results for input(s): LIPASE, AMYLASE in the last 72 hours.  Recent Labs  01/14/17 0808 01/14/17 1434 01/14/17 1938  TROPONINI 0.14* 0.15* 0.15*   Invalid input(s): POCBNP No results for input(s): DDIMER in the last 72 hours. No results for input(s): HGBA1C in the last 72 hours.  Recent Labs  01/15/17 0702  CHOL 123  HDL 36*  LDLCALC 55  TRIG 161*  CHOLHDL 3.4   No results for input(s): TSH, T4TOTAL, T3FREE, THYROIDAB in the last 72 hours.  Invalid input(s): FREET3 No results for input(s): VITAMINB12, FOLATE, FERRITIN, TIBC, IRON, RETICCTPCT in the last 72 hours. Coags:  Recent Labs  01/15/17 1147  INR 1.01   Microbiology: No results found for this or any previous visit (from the past 240 hour(s)).  FURTHER DISCHARGE INSTRUCTIONS:  Get Medicines reviewed and adjusted: Please take all your medications with you for your next visit with your Primary MD  Laboratory/radiological data: Please request your Primary MD to go over all hospital tests and procedure/radiological  results at the follow up, please ask your Primary MD to get all Hospital records sent to his/her office.  In some cases, they will be blood work, cultures and biopsy results pending at the time of your discharge. Please request that your primary care M.D. goes through all the records of your hospital data and follows up on these results.  Also Note the following: If you experience worsening of your admission symptoms, develop  shortness of breath, life threatening emergency, suicidal or homicidal thoughts you must seek medical attention immediately by calling 911 or calling your MD immediately  if symptoms less severe.  You must read complete instructions/literature along with all the possible adverse reactions/side effects for all the Medicines you take and that have been prescribed to you. Take any new Medicines after you have completely understood and accpet all the possible adverse reactions/side effects.   Do not drive when taking Pain medications or sleeping medications (Benzodaizepines)  Do not take more than prescribed Pain, Sleep and Anxiety Medications. It is not advisable to combine anxiety,sleep and pain medications without talking with your primary care practitioner  Special Instructions: If you have smoked or chewed Tobacco  in the last 2 yrs please stop smoking, stop any regular Alcohol  and or any Recreational drug use.  Wear Seat belts while driving.  Please note: You were cared for by a hospitalist during your hospital stay. Once you are discharged, your primary care physician will handle any further medical issues. Please note that NO REFILLS for any discharge medications will be authorized once you are discharged, as it is imperative that you return to your primary care physician (or establish a relationship with a primary care physician if you do not have one) for your post hospital discharge needs so that they can reassess your need for medications and monitor your lab  values.  Total Time spent coordinating discharge including counseling, education and face to face time equals 45 minutes.  SignedJeoffrey Massed 01/16/2017 12:03 PM

## 2017-01-16 NOTE — Progress Notes (Signed)
Patient states he feels like he has cold sweats, and is nauseated. Denies chest pain. Pt given labetalol and zofran. Is hypertensive SBP >200.  Within 5 minutes after med administration pt reports relief of symptoms.

## 2017-01-16 NOTE — Progress Notes (Signed)
7   [facility]  @ [place] # [phone]   BRILINTA  90 MG BID   COVER- YES  CO-PAY- $ 186.40   DEDUCTIBLE NOT MET  TIER - 3 DRUG  PRIOR APPROVAL- NO   PHARMACY : [facility]

## 2017-01-16 NOTE — Progress Notes (Signed)
Progress Note  Patient Name: Shawn Lopez Date of Encounter: 01/16/2017  Primary Cardiologist: New to [facility] - Follow-up in [place], [place]  Subjective   Has shortness of breath with Brilinta, but improved with caffeine. Denies any recurrent chest pain or palpitations.   Inpatient Medications    Scheduled Meds: . aspirin EC  81 mg Oral Daily  . atorvastatin  80 mg Oral q1800  . insulin aspart  0-9 Units Subcutaneous TID WC  . lisinopril  2.5 mg Oral Daily  . metoprolol tartrate  12.5 mg Oral BID  . sodium chloride flush  3 mL Intravenous Q12H  . ticagrelor  90 mg Oral BID   Continuous Infusions: . sodium chloride     PRN Meds: sodium chloride, acetaminophen, hydrALAZINE, morphine injection, nitroGLYCERIN, ondansetron (ZOFRAN) IV, sodium chloride flush   Vital Signs    Vitals:   01/15/17 2100 01/15/17 2200 01/16/17 0000 01/16/17 0500  BP: (!) 164/66 (!) 146/54 (!) 153/54 (!) 157/66  Pulse: 66 64 (!) 59 60  Resp: 13 19  (!) 21  Temp:    97.8 F (36.6 C)  TempSrc:    Oral  SpO2: 97% 97% 97% 96%  Weight:    193 lb 9 oz (87.8 kg)  Height:        Intake/Output Summary (Last 24 hours) at 01/16/17 0811 Last data filed at 01/16/17 0513  Gross per 24 hour  Intake             1511 ml  Output              700 ml  Net              811 ml   Filed Weights   01/14/17 0513 01/15/17 0436 01/16/17 0500  Weight: 196 lb (88.9 kg) 191 lb 6.4 oz (86.8 kg) 193 lb 9 oz (87.8 kg)    Telemetry    NSR, HR in 60's - 70's. Frequent PAC's.  - Personally Reviewed  ECG    Sinus bradycardia, HR 58, with anterolateral TWI.   - Personally Reviewed  Physical Exam   General: Well developed, well nourished, male appearing in no acute distress. Head: Normocephalic, atraumatic.  Neck: Supple without bruits, JVD not elevated. Lungs:  Resp regular and unlabored, CTA without wheezing or rales. Heart: RRR, S1, S2, no S3, S4, or murmur; no rub. Abdomen: Soft, non-tender, non-distended  with normoactive bowel sounds. No hepatomegaly. No rebound/guarding. No obvious abdominal masses. Extremities: No clubbing, cyanosis, or edema. Distal pedal pulses are 2+ bilaterally. Right radial cath site stable without ecchymosis or evidence of a hematoma.  Neuro: Alert and oriented X 3. Moves all extremities spontaneously. Psych: Normal affect.  Labs    Chemistry Recent Labs Lab 01/13/17 0954 01/15/17 0702 01/16/17 0335  NA 135 134* 135  K 4.0 4.0 3.8  CL 101 102 104  CO2 24 24 21*  GLUCOSE 166* 162* 164*  BUN CREATININE 0.80 0.69 0.74  CALCIUM 9.2 9.0 9.5  GFRNONAA >60 >60 >60  GFRAA >60 >60 >60  ANIONGAP Hematology Recent Labs Lab 01/14/17 0216 01/15/17 0702 01/16/17 0335  WBC 8.9 7.1 11.1*  RBC 4.84 5.07 5.04  HGB 14.4 15.0 15.0  HCT 43.8 45.5 44.7  MCV 90.5 89.7 88.7  MCH 29.8 29.6 29.8  MCHC 32.9 33.0 33.6  RDW 13.3 13.3 13.4  PLT 172 171 184    Cardiac Enzymes Recent Labs Lab  01/13/17 1810 01/14/17 0808 01/14/17 1434 01/14/17 1938  TROPONINI 0.37* 0.14* 0.15* 0.15*    Recent Labs Lab 01/13/17 0952  TROPIPOC 0.00     BNPNo results for input(s): BNP, PROBNP in the last 168 hours.   DDimer No results for input(s): DDIMER in the last 168 hours.   Radiology    No results found.  Cardiac Studies   Echocardiogram: 01/14/2017  Study Conclusions - Left ventricle: The cavity size was normal. There was mild focal   basal hypertrophy of the septum. Systolic function was normal.   The estimated ejection fraction was in the range of 55% to 60%.   Wall motion was normal; there were no regional wall motion   abnormalities. Doppler parameters are consistent with abnormal   left ventricular relaxation (grade 1 diastolic dysfunction).   Doppler parameters are consistent with high ventricular filling   pressure. - Aortic valve: There was trivial regurgitation. - Mitral valve: Calcified annulus. Mildly thickened leaflets . -  Left atrium: The atrium was moderately dilated. - Atrial septum: There was increased thickness of the septum,   consistent with lipomatous hypertrophy. - Pulmonary arteries: Systolic pressure was mildly increased. PA   peak pressure: 32 mm Hg (S).  Impressions:  - Normal LV systolic function; grade 1 diastolic dysfunction with   elevated LV filling pressure; trace AI; moderate LAE; mild TR   with mildly elevated pulmonary pressure.  Cardiac Catheterization: 01/15/2017  Mid LAD to Dist LAD lesion, 95 %stenosed.  Prox LAD to Mid LAD lesion, 75 %stenosed.  Post intervention, there is a 0% residual stenosis.  A stent was successfully placed.  1st Diag lesion, 75 %stenosed.  Post intervention, there is a 0% residual stenosis.  A stent was successfully placed.  IMPRESSION: Successful proximal and mid LAD PCI and stenting as well as mid first diagonal branch PCI and drug-eluting stenting using synergy drug-eluting stents. The sheath was removed and a TR band was placed on the right wrist to achieve patent hemostasis. The patient left the lab in stable condition. We will continue Angiomax with bolus for 4 hours after which it'll be discontinued. The patient will most likely be able to be discharged tomorrow.  Patient Profile     76 y.o. male with PMH of HTN, HLD, Type 2 DM, remote history of PAF (occurring in 2006), and tobacco use who presented as a transfer to [facility] on 01/13/2017 for evaluation of chest pain and left arm pain. Found to have elevated troponin values up to 0.37.  Assessment & Plan    1. NSTEMI - presented to [facility] as a transfer from [facility] after presenting for evaluation of left arm pain and palpitations. He denies any recent chest pain. Symptoms resolved after administration of 1 SL NTG.  - cyclic troponin values have been at 0.33, 0.37, 0.14, 0.15, and 0.15. - echo shows a preserved EF of 55-60% with no regional WMA.  - cardiac catheterization on 4/23  showed 95% mid-LAD stenosis, 75% prox-LAD stenosis and 75% 1st Diag stenosis. He underwent stenting of the proximal and mid-LAD along with the 1st Diagonal.  - continue ASA, Brilinta, BB, and statin therapy.   2. HTN - BP variable at 134/54 - 203/94 in the past 24 hours. Systolic currently in the 170's.  - Lopressor reduced to 12.5mg  BID in the setting of bradycardia. Will increase Lisinopril from 2.5mg  daily to  daily.   3. Palpitations - patient reports having episodes of intermittent palpitations for many months.  Symptoms somewhat similar to prior episode of atrial fibrillation in 2006. Telemetry shows frequent PAC's but no atrial fibrillation/flutter.   4. Type 2 DM - per admitting team  5. HLD - Lipid Panel shows total cholesterol 123, HDL 36, and LDL at 55. - continue Atorvastatin  daily.   Signed, Ellsworth Lennox , PA-C 8:11 AM 01/16/2017 Pager: [phone] Patient seen and examined and history reviewed. Agree with above findings and plan. No recurrent angina (left arm pain). Did complain of SOB last night. This improved with caffeine. Most likely related to Brilinta. For Brilinta his co pay is $186/month. For this reason will give Brilinta for one month with savings card and then he will need to be switched to alternative antiplatelet therapy with Plavix. Recommend load of 300 mg Plavix at that time then 75 mg daily. Other therapy as outlined above. Will arrange follow up in our [place] office. He is stable for DC today.   Britani Beattie [place], MDFACC 01/16/2017 11:26 AM

## 2017-01-16 NOTE — Discharge Instructions (Addendum)
PLEASE REMEMBER TO BRING ALL OF YOUR MEDICATIONS TO EACH OF YOUR FOLLOW-UP OFFICE VISITS.  PLEASE ATTEND ALL SCHEDULED FOLLOW-UP APPOINTMENTS.   Activity: Increase activity slowly as tolerated. You may shower, but no soaking baths (or swimming) for 1 week. No driving for 48 hours. No lifting over 10 lbs for 2 weeks. No sexual activity for 2 weeks.   You May Return to Work: in 1 week (if applicable)  Wound Care: You may wash cath site gently with soap and water. Keep cath site clean and dry. If you notice pain, swelling, bleeding or pus at your cath site, please call [phone].

## 2017-01-16 NOTE — Care Management Note (Addendum)
Case Management Note  Patient Details  Name: Shawn Lopez MRN: [id number] Date of Birth: May 15, 1941  Subjective/Objective:    From home , s/p coronary stent intervention, will be on brilinta, NCM gave patient 30 day savings card, He will go to [facility] in [place] to get 30 day free brilinta, they have it in stock.  PTA indep.  Still awaiting co pay amount, will give to patient when receive it, also have his cell phone [phone].  Informed him if co pay ends up being too high to  Let MD know on his follow up apt and they may change it to something else.  He is for dc today.      7   S/W KIANA  @ [place] # [phone]   BRILINTA  90 MG BID   COVER- YES  CO-PAY- $ 186.40   DEDUCTIBLE NOT MET  TIER - 3 DRUG  PRIOR APPROVAL- NO   PH[facility]ACY : [facility]             Action/Plan:   Expected Discharge Date:                  Expected Discharge Plan:  Home/Self Care  In-House Referral:     Discharge planning Services  CM Consult  Post Acute Care Choice:    Choice offered to:     DME Arranged:    DME Agency:     HH Arranged:    [place] Agency:     Status of Service:  Completed, signed off  If discussed at [employer] of Stay Meetings, dates discussed:    Additional Comments:  Zenon Mayo, RN 01/16/2017, 9:22 AM

## 2017-01-22 ENCOUNTER — Telehealth: Payer: Self-pay | Admitting: *Deleted

## 2017-01-22 NOTE — Telephone Encounter (Signed)
Orion -10 Study  I spoke to patient to let him know that he did not qualify for the Dal-Gene Study. Patient did not have the genotype.. I thanked patient for his willingness to participate in the study.

## 2017-01-25 ENCOUNTER — Encounter: Payer: Self-pay | Admitting: Student

## 2017-01-25 NOTE — Progress Notes (Signed)
Cardiology Office Note    Date:  01/26/2017   ID:  Xaden, Kaufman 1941/04/19, MRN [id number]  PCP:  Milana Obey, MD  Cardiologist: Seen by Dr. [place] at [facility] --> F/U in [place], [place] --> Wishes to follow-up with Dr. [name]  Chief Complaint  Patient presents with  . Hospitalization Follow-up    recent PCI    History of Present Illness:    Shawn Lopez is a 76 y.o. male with past medical history of HTN, HLD, Type 2 DM, remote history of PAF (in 2006), and tobacco use who presents to the office today for hospital follow-up.   He was recently admitted from 4/21 - 01/16/2017 for an NSTEMI. Troponin values peaked at 0.37 during admission. His cardiac catheterization was performed on 4/23 and showed 95% mid-LAD stenosis, 75% prox-LAD stenosis and 75% 1st Diag stenosis. He underwent stenting of the proximal and mid-LAD along with the 1st Diagonal. He was started on ASA, Brilinta, statin, and BB therapy. He did experience with Brilinta which improved with caffeine intake, but his co-pay for Brilinta is over $180 per month, therefore will need to be switched to Plavix.   In talking with the patient today, he reports doing well since his recent hospitalization. He reports having more energy over the past few weeks that he has had in the past several years. He denies any recurrent episodes of chest discomfort or dyspnea on exertion. Reports he has adjusted to the Brilinta dosing and is experiencing minimal dyspnea with his evening dose which spontaneously resolves within a few minutes.  Reports good compliance with his medication regimen and denies missing any doses of his ASA or Brilinta.   Past Medical History:  Diagnosis Date  . Arthritis   . Atrial fibrillation (HCC) 2006   Echocardiogram in 2006-mild LVH with normal EF  . Borderline hypertension   . CAD (coronary artery disease)    a. 12/2016: cath showing 95% mid-LAD stenosis, 75% prox-LAD stenosis and 75%  1st Diag stenosis. DES to the proximal and mid-LAD along with the 1st Diagonal.  . Chest pain 2001   negative stress nuclear in 2006; negative coronary angiography(no documentation); essentially normal PFTs in 07/2004; CT in 2012-lipomatous hypertrophy; mild atherosclerosis of the aorta and coronaries; mild dependent pulmonary scarring or atelectasis; small area of focal pleural thickening; chronic bronchitic changes on chest x-ray  . Chronic back pain    Prior trauma with vertebral fracture  . Diabetes mellitus   . Diabetes mellitus    CBGs 150-170 in 2011; 2112- treated with single oral agent  . Hyperlipidemia   . Hypertension   . Osteopenia    by bone densitometry  . 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 APPENDECTOMY  1960  . CATARACT EXTRACTION  2012   right eye,lens implantation  . CHOLECYSTECTOMY    . CIRCUMCISION  2010   meatotomy and dilatation  . COLONOSCOPY N/A 01/14/2013   Procedure: COLONOSCOPY;  Surgeon: Dalia Heading, MD;  Location: [facility] ENDO [facility]ITE;  Service: Gastroenterology;  Laterality: N/A;  . CORONARY ANGIOGRAPHY N/A 01/15/2017   Procedure: Coronary Angiography;  Surgeon: Runell Gess, MD;  Location: [facility] INVASIVE CV LAB;  Service: Cardiovascular;  Laterality: N/A;  . CORONARY STENT INTERVENTION N/A 01/15/2017   Procedure: Coronary Stent Intervention;  Surgeon: Runell Gess, MD;  Location: [facility] INVASIVE CV LAB;  Service: Cardiovascular;  Laterality: N/A;    Current Medications:  Outpatient Medications Prior to Visit  Medication Sig Dispense Refill  . aspirin EC 81 MG EC tablet Take 1 tablet (81 mg total) by mouth daily. 30 tablet 0  . atorvastatin (LIPITOR) 80 MG tablet Take 1 tablet (80 mg total) by mouth daily at 6 PM. 30 tablet 0  . lisinopril (PRINIVIL,ZESTRIL) 5 MG tablet Take 1 tablet (5 mg total) by mouth daily. 30 tablet 0  . metoprolol tartrate (LOPRESSOR) 25 MG tablet Take 25 mg by mouth 2 (two)  times daily.      . nitroGLYCERIN (NITROSTAT) 0.4 MG SL tablet Place 1 tablet (0.4 mg total) under the tongue every 5 (five) minutes as needed for chest pain. For 3 doses only 30 tablet 0  . ticagrelor (BRILINTA) 90 MG TABS tablet Take 1 tablet (90 mg total) by mouth 2 (two) times daily. 60 tablet 0  . metFORMIN (GLUCOPHAGE) 500 MG tablet Take 2 tablets (1,000 mg total) by mouth 2 (two) times daily.     No facility-administered medications prior to visit.      Allergies:   Patient has no known allergies.   Social History   Social History  . Marital status: Married    Spouse name: N/A  . Number of children: 6  . Years of education: N/A   Occupational History  . Hospital doctor   Social History Main Topics  . Smoking status: Former Smoker    Packs/day: 1.50    Years: 15.00    Types: Cigarettes    Quit date: 04/20/1997  . Smokeless tobacco: Never Used  . Alcohol use No  . Drug use: No  . Sexual activity: Not Asked   Other Topics Concern  . None   Social History Narrative  . None     Family History:  The patient's family history includes CAD in his father; Heart attack in his brother.   Review of Systems:   Please see the history of present illness.     General:  No chills, fever, night sweats or weight changes.  Cardiovascular:  No chest pain, edema, orthopnea, palpitations, paroxysmal nocturnal dyspnea. Positive for mild dyspnea. Dermatological: No rash, lesions/masses Respiratory: No cough, dyspnea Urologic: No hematuria, dysuria Abdominal:   No nausea, vomiting, diarrhea, bright red blood per rectum, melena, or hematemesis Neurologic:  No visual changes, wkns, changes in mental status. All other systems reviewed and are otherwise negative except as noted above.   Physical Exam:    VS:  BP 130/62   Pulse (!) 58   Ht 5\' 10"  (1.778 m)   Wt 189 lb (85.7 kg)   SpO2 97%   BMI 27.12 kg/m    General: Well developed, well nourished  Caucasian male appearing in no acute distress. Head: Normocephalic, atraumatic, sclera non-icteric, no xanthomas, nares are without discharge.  Neck: No carotid bruits. JVD not elevated.  Lungs: Respirations regular and unlabored, without wheezes or rales.  Heart: Regular rate and rhythm. No S3 or S4.  No murmur, no rubs, or gallops appreciated. Abdomen: Soft, non-tender, non-distended with normoactive bowel sounds. No hepatomegaly. No rebound/guarding. No obvious abdominal masses. Msk:  Strength and tone appear normal for age. No joint deformities or effusions. Extremities: No clubbing or cyanosis. No lower extremity edema.  Distal pedal pulses are 2+ bilaterally. Radial cath site stable.  Neuro: Alert and oriented X 3. Moves all extremities spontaneously. No focal deficits noted. Psych:  Responds to questions appropriately with a normal affect. Skin: No  rashes or lesions noted  Wt Readings from Last 3 Encounters:  01/26/17 189 lb (85.7 kg)  01/16/17 193 lb 9 oz (87.8 kg)  12/16/13 197 lb (89.4 kg)     Studies/Labs Reviewed:   EKG:  EKG is ordered today.  The ekg ordered today demonstrates sinus bradycardia, HR 49, with anterior TWI.   Recent Labs: 01/15/2017: Magnesium 2.2 01/16/2017: BUN 10; Creatinine, Ser 0.74; Hemoglobin 15.0; Platelets 184; Potassium 3.8; Sodium 135   Lipid Panel    Component Value Date/Time   CHOL 123 01/15/2017 0702   TRIG 158 (H) 01/15/2017 0702   HDL 36 (L) 01/15/2017 0702   CHOLHDL 3.4 01/15/2017 0702   VLDL 32 01/15/2017 0702   LDLCALC 55 01/15/2017 0702    Additional studies/ records that were reviewed today include:   Echocardiogram: 01/14/2017 Study Conclusions  - Left ventricle: The cavity size was normal. There was mild focal   basal hypertrophy of the septum. Systolic function was normal.   The estimated ejection fraction was in the range of 55% to 60%.   Wall motion was normal; there were no regional wall motion   abnormalities. Doppler  parameters are consistent with abnormal   left ventricular relaxation (grade 1 diastolic dysfunction).   Doppler parameters are consistent with high ventricular filling   pressure. - Aortic valve: There was trivial regurgitation. - Mitral valve: Calcified annulus. Mildly thickened leaflets . - Left atrium: The atrium was moderately dilated. - Atrial septum: There was increased thickness of the septum,   consistent with lipomatous hypertrophy. - Pulmonary arteries: Systolic pressure was mildly increased. PA   peak pressure: 32 mm Hg (S).  Impressions:  - Normal LV systolic function; grade 1 diastolic dysfunction with   elevated LV filling pressure; trace AI; moderate LAE; mild TR   with mildly elevated pulmonary pressure.   Cardiac Catheterization: 01/15/2017  Mid LAD to Dist LAD lesion, 95 %stenosed.  Prox LAD to Mid LAD lesion, 75 %stenosed.  Post intervention, there is a 0% residual stenosis.  A stent was successfully placed.  1st Diag lesion, 75 %stenosed.  Post intervention, there is a 0% residual stenosis.  A stent was successfully placed.     IMPRESSION: Successful proximal and mid LAD PCI and stenting as well as mid first diagonal branch PCI and drug-eluting stenting using synergy drug-eluting stents. The sheath was removed and a TR band was placed on the right wrist to achieve patent hemostasis. The patient left the lab in stable condition. We will continue Angiomax with bolus for 4 hours after which it'll be discontinued. The patient will most likely be able to be discharged tomorrow.  Assessment:    1. Non-ST elevation myocardial infarction (NSTEMI), subsequent episode of care (HCC)   2. Coronary artery disease involving native coronary artery of native heart without angina pectoris   3. Essential hypertension   4. Hyperlipidemia LDL goal <70   5. Type 2 diabetes mellitus with complication, without long-term current use of insulin (HCC)      Plan:   In  order of problems listed above:  1. Subsequent Episode of Care for NSTEMI/ CAD - recently admitted for an NSTEMI with cath showing 95% mid-LAD stenosis, 75% prox-LAD stenosis and 75% 1st Diag stenosis. He underwent stenting of the proximal and mid-LAD along with the 1st Diagonal. - He denies any recurrent anginal symptoms and reports feeling the best he has felt in the past several years. - continue ASA, Brilinta, statin, and BB therapy. It was  thought his co-pay for Brilinta was going to be over $180 per month, therefore thinking he would need to be switched to Plavix. In talking with the patient today, he is expecting to receive many hospital bills over the next month and will therefore meet his deductible and be able to afford Brilinta. Samples were provided for the next several weeks. If he is still unable to afford the medication, would switch to Plavix but would need a 300mg  load then 75mg  daily.   2. HTN - BP well-controlled at 130/62.  - continue current medication regimen.   3. HLD - Lipid Panel in 12/2016 showed total cholesterol 123, HDL 36, and LDL 55. - LDL at goal of < 70 with known CAD. - continue Atorvastatin 80mg  daily.   4. Type 2 DM - followed by PCP.   Medication Adjustments/Labs and Tests Ordered: Current medicines are reviewed at length with the patient today.  Concerns regarding medicines are outlined above.  Medication changes, Labs and Tests ordered today are listed in the Patient Instructions below. Patient Instructions  Your physician recommends that you schedule a follow-up appointment in: 3 months Dr. Diona BrownerMcDowell  Your physician recommends that you continue on your current medications as directed. Please refer to the Current Medication list given to you today.  If you need a refill on your cardiac medications before your next appointment, please call your pharmacy.  Work slip provided today  Thank you for choosing [facility] !      Signed, Ellsworth LennoxBrittany M Jahfari Ambers, PA-C  01/26/2017 5:09 PM    [facility] [address], [address] [place], [place]  [address] Phone: [phone]; Fax: [phone]  [address], [address] [place], [place] [address] Phone: [phone]

## 2017-01-26 ENCOUNTER — Encounter: Payer: Self-pay | Admitting: Student

## 2017-01-26 ENCOUNTER — Ambulatory Visit (INDEPENDENT_AMBULATORY_CARE_PROVIDER_SITE_OTHER): Payer: Medicare HMO | Admitting: Student

## 2017-01-26 VITALS — BP 130/62 | HR 58 | Ht 70.0 in | Wt 189.0 lb

## 2017-01-26 DIAGNOSIS — I2510 Atherosclerotic heart disease of native coronary artery without angina pectoris: Secondary | ICD-10-CM

## 2017-01-26 DIAGNOSIS — I10 Essential (primary) hypertension: Secondary | ICD-10-CM

## 2017-01-26 DIAGNOSIS — I214 Non-ST elevation (NSTEMI) myocardial infarction: Secondary | ICD-10-CM

## 2017-01-26 DIAGNOSIS — E785 Hyperlipidemia, unspecified: Secondary | ICD-10-CM

## 2017-01-26 DIAGNOSIS — E118 Type 2 diabetes mellitus with unspecified complications: Secondary | ICD-10-CM

## 2017-01-26 NOTE — Patient Instructions (Addendum)
Your physician recommends that you schedule a follow-up appointment in: 3 months Dr. Diona BrownerMcDowell    Your physician recommends that you continue on your current medications as directed. Please refer to the Current Medication list given to you today.      If you need a refill on your cardiac medications before your next appointment, please call your pharmacy.    Work slip provided today    Thank you for choosing [facility] !

## 2017-02-05 ENCOUNTER — Emergency Department (HOSPITAL_COMMUNITY)
Admission: EM | Admit: 2017-02-05 | Discharge: 2017-02-05 | Disposition: A | Discharge status: Home / Self Care | Payer: Medicare HMO | Attending: Emergency Medicine | Admitting: Emergency Medicine

## 2017-02-05 DIAGNOSIS — R0789 Other chest pain: Secondary | ICD-10-CM | POA: Diagnosis not present

## 2017-02-05 DIAGNOSIS — Z79899 Other long term (current) drug therapy: Secondary | ICD-10-CM | POA: Diagnosis not present

## 2017-02-05 DIAGNOSIS — I2510 Atherosclerotic heart disease of native coronary artery without angina pectoris: Secondary | ICD-10-CM | POA: Insufficient documentation

## 2017-02-05 DIAGNOSIS — R450 Nervousness: Secondary | ICD-10-CM | POA: Diagnosis not present

## 2017-02-05 DIAGNOSIS — I10 Essential (primary) hypertension: Secondary | ICD-10-CM | POA: Diagnosis not present

## 2017-02-05 DIAGNOSIS — E119 Type 2 diabetes mellitus without complications: Secondary | ICD-10-CM | POA: Diagnosis not present

## 2017-02-05 DIAGNOSIS — Z87891 Personal history of nicotine dependence: Secondary | ICD-10-CM | POA: Insufficient documentation

## 2017-02-05 DIAGNOSIS — R5383 Other fatigue: Secondary | ICD-10-CM | POA: Diagnosis not present

## 2017-02-05 DIAGNOSIS — Z7982 Long term (current) use of aspirin: Secondary | ICD-10-CM | POA: Insufficient documentation

## 2017-02-05 DIAGNOSIS — R0602 Shortness of breath: Secondary | ICD-10-CM | POA: Insufficient documentation

## 2017-02-05 MED ORDER — LORAZEPAM 1 MG PO TABS: 0.5000 mg | ORAL_TABLET | Freq: Every evening | ORAL | 0 refills | Status: DC | PRN

## 2017-02-05 NOTE — ED Provider Notes (Signed)
[facility]-EMERGENCY DEPT Provider Note   CSN: [id number] Arrival date & time: 02/05/17  1334  By signing my name below, I, Sonum Patel, attest that this documentation has been prepared under the direction and in the presence of Raeford Razor, MD. Electronically Signed: Sonum Patel, Neurosurgeon. 02/05/17. 5:46 PM.  History   Chief Complaint Chief Complaint  Patient presents with  . Anxiety    The history is provided by the patient. No language interpreter was used.    HPI Comments: SELAH ZELMAN is a 76 y.o. male who presents to the Emergency Department complaining of insomnia and feeling anxious since having an MI and 4 cardiac stents placed 3 weeks ago. He has associated SOB, fatigue, shaky/jitterness when he has these episodes. He states it lasts for about 1 hour at a time and sometimes wakes him from sleep. He denies similar symptoms in the past. He also notes a burning sensation to the left chest that he has noticed since his MI. He states the episodes are becoming more frequent and are worse at night. He was started on Brilinta and is concerned it may be causing these symptoms. He was seen by his cardiologist 3 days ago but did not discuss these concerns at that time. He denies cough, swelling.   Cardiologist [place]  Past Medical History:  Diagnosis Date  . Arthritis   . Atrial fibrillation (HCC) 2006   Echocardiogram in 2006-mild LVH with normal EF  . Borderline hypertension   . CAD (coronary artery disease)    a. 12/2016: cath showing 95% mid-LAD stenosis, 75% prox-LAD stenosis and 75% 1st Diag stenosis. DES to the proximal and mid-LAD along with the 1st Diagonal.  . Chest pain 2001   negative stress nuclear in 2006; negative coronary angiography(no documentation); essentially normal PFTs in 07/2004; CT in 2012-lipomatous hypertrophy; mild atherosclerosis of the aorta and coronaries; mild dependent pulmonary scarring or atelectasis; small area of focal pleural thickening; chronic  bronchitic changes on chest x-ray  . Chronic back pain    Prior trauma with vertebral fracture  . Diabetes mellitus   . Diabetes mellitus    CBGs 150-170 in 2011; 2112- treated with single oral agent  . Hyperlipidemia   . Hypertension   . Osteopenia    by bone densitometry  . 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 Chest pain 01/13/2017  . NSTEMI (non-ST elevated myocardial infarction) (HCC) 01/13/2017  . Pain in joint, shoulder region 10/20/2013  . Muscle spasm of right shoulder 10/20/2013  . Right rotator cuff tear 10/14/2013  . 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 Atrial fibrillation (HCC)   . Essential hypertension   . Hyperlipidemia LDL goal <70   . Diabetes mellitus (HCC)   . Osteopenia   . Chronic back pain   . Chest pain     Past Surgical History:  Procedure Laterality Date  . APPENDECTOMY  1960  . CATARACT EXTRACTION  2012   right eye,lens implantation  . CHOLECYSTECTOMY    . CIRCUMCISION  2010   meatotomy and dilatation  . COLONOSCOPY N/A 01/14/2013   Procedure: COLONOSCOPY;  Surgeon: Dalia Heading, MD;  Location: [facility];  Service: Gastroenterology;  Laterality: N/A;  . CORONARY ANGIOGRAPHY N/A 01/15/2017   Procedure: Coronary Angiography;  Surgeon: Runell Gess, MD;  Location: [facility] INVASIVE CV LAB;  Service: Cardiovascular;  Laterality: N/A;  . CORONARY STENT INTERVENTION N/A 01/15/2017   Procedure: Coronary  Stent Intervention;  Surgeon: Runell Gess, MD;  Location: [facility] INVASIVE CV LAB;  Service: Cardiovascular;  Laterality: N/A;     Home Medications    Prior to Admission medications   Medication Sig Start Date End Date Taking? Authorizing Provider  aspirin EC 81 MG EC tablet Take 1 tablet (81 mg total) by mouth daily. 01/17/17   Ghimire, Werner Lean, MD  atorvastatin (LIPITOR) 80 MG tablet Take 1 tablet (80 mg total) by mouth daily at 6 PM. 01/16/17   Ghimire, Werner Lean, MD  lisinopril  (PRINIVIL,ZESTRIL) 5 MG tablet Take 1 tablet (5 mg total) by mouth daily. 01/17/17   Ghimire, Werner Lean, MD  metoprolol tartrate (LOPRESSOR) 25 MG tablet Take 25 mg by mouth 2 (two) times daily.      [provider]  nitroGLYCERIN (NITROSTAT) 0.4 MG SL tablet Place 1 tablet (0.4 mg total) under the tongue every 5 (five) minutes as needed for chest pain. For 3 doses only 01/16/17   Ghimire, Werner Lean, MD  ticagrelor (BRILINTA) 90 MG TABS tablet Take 1 tablet (90 mg total) by mouth 2 (two) times daily. 01/16/17   Ghimire, Werner Lean, MD    Family History Family History  Problem Relation Age of Onset  . CAD Father   . Heart attack Brother     Social History Social History  Substance Use Topics  . Smoking status: Former Smoker    Packs/day: 1.50    Years: 15.00    Types: Cigarettes    Quit date: 04/20/1997  . Smokeless tobacco: Never Used  . Alcohol use No     Allergies   Patient has no known allergies.   Review of Systems Review of Systems  All other systems reviewed and are negative for acute change except as noted in the HPI.   Physical Exam Updated Vital Signs BP (!) 162/67 (BP Location: Right Arm)   Pulse (!) 53   Temp 98.2 F (36.8 C) (Oral)   Resp 18   Ht 5\' 10"  (1.778 m)   Wt 190 lb (86.2 kg)   SpO2 98%   BMI 27.26 kg/m   Physical Exam  Constitutional: He is oriented to person, place, and time. He appears well-developed and well-nourished.  HENT:  Head: Normocephalic and atraumatic.  Eyes: EOM are normal.  Neck: Normal range of motion.  Cardiovascular: Normal rate, regular rhythm, normal heart sounds and intact distal pulses.   Pulmonary/Chest: Effort normal and breath sounds normal. No respiratory distress.  Abdominal: Soft. He exhibits no distension. There is no tenderness.  Musculoskeletal: Normal range of motion.  Neurological: He is alert and oriented to person, place, and time.  Skin: Skin is warm and dry.  Psychiatric: He has a normal mood and  affect. Judgment normal.  Nursing note and vitals reviewed.    ED Treatments / Results  DIAGNOSTIC STUDIES: Oxygen Saturation is 98% on RA, normal by my interpretation.    COORDINATION OF CARE: 5:27 PM Discussed treatment plan with pt at bedside and pt agreed to plan.   Labs (all labs ordered are listed, but only abnormal results are displayed) Labs Reviewed - No data to display  EKG  EKG Interpretation None       Radiology No results found.  Procedures Procedures (including critical care time)  Medications Ordered in ED Medications - No data to display   Initial Impression / Assessment and Plan / ED Course  I have reviewed the triage vital signs and the nursing notes.  Pertinent labs & imaging results that were available during my care of the patient were reviewed by me and considered in my medical decision making (see chart for details).     75yM with feelings of being "jittery." He thinks meds related. I suspect not but advise he discuss further with his cardiologist. He seems to notice this when trying to sleep and does confess he does regularly think about what if he had a bad outcome with his "widow maker." EKG not acutely changed. I doubt ACS or other emergent process.   Final Clinical Impressions(s) / ED Diagnoses   Final diagnoses:  Feeling jittery    New Prescriptions New Prescriptions   No medications on file   I personally preformed the services scribed in my presence. The recorded information has been reviewed is accurate. Raeford RazorStephen Teyah Rossy, MD.    Raeford RazorKohut, Philena Obey, MD 02/06/17 [phone]

## 2017-02-05 NOTE — ED Notes (Signed)
EKG given to Dr. Juleen ChinaKohut and notified him that pt has had intermittent burning in left chest today. Dr. Juleen ChinaKohut at bedside to evaluate pt and is good for discharge.  HR 51-58 and MD okay with d/c vitals at this time.

## 2017-02-05 NOTE — ED Triage Notes (Addendum)
Pt reports that he has been feeling "jittery on the insides" . Pt reports that this feeling comes and goes. States he didn't sleep last night due to the feeling and then would wake up gasping for breath. Stints placed 3 weeks ago and  Pt states he noticed this since he was started on Brilinta and stent placement

## 2017-02-05 NOTE — ED Notes (Signed)
Pt made aware to return if symptoms worsen or if any life threatening symptoms occur.   

## 2017-02-05 NOTE — ED Notes (Signed)
Pt reports intermittent burning and weakness in left chest.

## 2017-02-06 ENCOUNTER — Encounter: Payer: Self-pay | Admitting: Adult Health

## 2017-02-06 ENCOUNTER — Ambulatory Visit (INDEPENDENT_AMBULATORY_CARE_PROVIDER_SITE_OTHER): Payer: Medicare HMO | Admitting: Adult Health

## 2017-02-06 VITALS — BP 144/80 | HR 67 | Ht 70.0 in | Wt 187.0 lb

## 2017-02-06 DIAGNOSIS — I2510 Atherosclerotic heart disease of native coronary artery without angina pectoris: Secondary | ICD-10-CM

## 2017-02-06 DIAGNOSIS — I10 Essential (primary) hypertension: Secondary | ICD-10-CM

## 2017-02-06 MED ORDER — CLOPIDOGREL BISULFATE 75 MG PO TABS: 75.0000 mg | ORAL_TABLET | Freq: Every day | ORAL | 3 refills | Status: DC

## 2017-02-06 NOTE — Patient Instructions (Addendum)
Your physician recommends that you schedule a follow-up appointment in: 1 Month with Joni ReiningKathryn Lawrence, NP   Your physician has recommended you make the following change in your medication:  STOP Taking Brilinta   Start Taking Plavix 75 mg Daily at Night   If you need a refill on your cardiac medications before your next appointment, please call your pharmacy.  Thank you for choosing [facility]!

## 2017-02-06 NOTE — Progress Notes (Signed)
Cardiology Office Note   Date:  02/06/2017   ID:  Shawn Lopez, DOB May 27, 1941, MRN [id number]  PCP:  Gareth MorganKnowlton, [name], MD  Cardiologist: [place]/  Joni ReiningKathryn Yecenia Dalgleish, NP   Chief Complaint  Patient presents with  . Coronary Artery Disease  . Shortness of Breath      History of Present Illness: Shawn Lopez is a 76 y.o. male who presents for post ER visit. Known history of hypertension, PAF, CAD with NSTEMI stenting of proximal and mid LAD. Continued on DAPT. Was last seen in the office on 01/26/2017. Doing well. Was seen in the ER with complaints of anxiety and burning sensation to left chest similar to what he experienced prior to stent.   He states that since he has gotten out of the hospital following stent placement he has been having intermittent periods of sudden dyspnea and mild chest pressure, causing him to feel anxious. Goes away in about 2-3 hours. This repeats in the evening.  This has become concerning to him.    Past Medical History:  Diagnosis Date  . Arthritis   . Atrial fibrillation (HCC) 2006   Echocardiogram in 2006-mild LVH with normal EF  . Borderline hypertension   . CAD (coronary artery disease)    a. 12/2016: cath showing 95% mid-LAD stenosis, 75% prox-LAD stenosis and 75% 1st Diag stenosis. DES to the proximal and mid-LAD along with the 1st Diagonal.  . Chest pain 2001   negative stress nuclear in 2006; negative coronary angiography(no documentation); essentially normal PFTs in 07/2004; CT in 2012-lipomatous hypertrophy; mild atherosclerosis of the aorta and coronaries; mild dependent pulmonary scarring or atelectasis; small area of focal pleural thickening; chronic bronchitic changes on chest x-ray  . Chronic back pain    Prior trauma with vertebral fracture  . Diabetes mellitus   . Diabetes mellitus    CBGs 150-170 in 2011; 2112- treated with single oral agent  . Hyperlipidemia   . Hypertension   . Osteopenia    by bone densitometry  .  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 APPENDECTOMY  1960  . CATARACT EXTRACTION  2012   right eye,lens implantation  . CHOLECYSTECTOMY    . CIRCUMCISION  2010   meatotomy and dilatation  . COLONOSCOPY N/A 01/14/2013   Procedure: COLONOSCOPY;  Surgeon: Dalia HeadingMark A Jenkins, MD;  Location: [facility];  Service: Gastroenterology;  Laterality: N/A;  . CORONARY ANGIOGRAPHY N/A 01/15/2017   Procedure: Coronary Angiography;  Surgeon: Runell GessJonathan J Berry, MD;  [facility];  Service: Cardiovascular;  Laterality: N/A;  . CORONARY STENT INTERVENTION N/A 01/15/2017   Procedure: Coronary Stent Intervention;  Surgeon: Runell GessJonathan J Berry, MD;  Location: [facility] INVASIVE CV LAB;  Service: Cardiovascular;  Laterality: N/A;     Current Outpatient Prescriptions  Medication Sig Dispense Refill  . aspirin EC 81 MG EC tablet Take 1 tablet (81 mg total) by mouth daily. 30 tablet 0  . atorvastatin (LIPITOR) 80 MG tablet Take 1 tablet (80 mg total) by mouth daily at 6 PM. 30 tablet 0  . lisinopril (PRINIVIL,ZESTRIL) 5 MG tablet Take 1 tablet (5 mg total) by mouth daily. 30 tablet 0  . LORazepam (ATIVAN) 1 MG tablet Take 0.5 tablets (0.5 mg total) by mouth at bedtime as needed for anxiety. 7 tablet 0  . metoprolol tartrate (LOPRESSOR) 25 MG tablet Take 25 mg by mouth 2 (two) times daily.      .Marland Kitchen  nitroGLYCERIN (NITROSTAT) 0.4 MG SL tablet Place 1 tablet (0.4 mg total) under the tongue every 5 (five) minutes as needed for chest pain. For 3 doses only 30 tablet 0  . clopidogrel (PLAVIX) 75 MG tablet Take 1 tablet (75 mg total) by mouth daily. 90 tablet 3   No current facility-administered medications for this visit.     Allergies:   Patient has no known allergies.    Social History:  The patient  reports that he quit smoking about 19 years ago. His smoking use included Cigarettes. He has a 22.50 pack-year smoking history. He has never used smokeless  tobacco. He reports that he does not drink alcohol or use drugs.   Family History:  The patient's family history includes CAD in his father; Heart attack in his brother.    ROS: All other systems are reviewed and negative. Unless otherwise mentioned in H&P    PHYSICAL EXAM: VS:  BP (!) 144/80   Pulse 67   Ht 5\' 10"  (1.778 m)   Wt 187 lb (84.8 kg)   SpO2 96%   BMI 26.83 kg/m  , BMI Body mass index is 26.83 kg/m. GEN: Well nourished, well developed, in no acute distress  HEENT: normal  Neck: no JVD, carotid bruits, or masses Cardiac: RRR; no murmurs, rubs, or gallops,no edema  Respiratory:  clear to auscultation bilaterally, normal work of breathing GI: soft, nontender, nondistended, + BS MS: no deformity or atrophy  Skin: warm and dry, no rash Neuro:  Strength and sensation are intact Psych: euthymic mood, full affect   EKG:  Reviewed from ER Sinus rhythm with PAC's, non-specific T-wave abnormalities.   Recent Labs: 01/15/2017: Magnesium 2.2 01/16/2017: BUN 10; Creatinine, Ser 0.74; Hemoglobin 15.0; Platelets 184; Potassium 3.8; Sodium 135    Lipid Panel    Component Value Date/Time   CHOL 123 01/15/2017 0702   TRIG 158 (H) 01/15/2017 0702   HDL 36 (L) 01/15/2017 0702   CHOLHDL 3.4 01/15/2017 0702   VLDL 32 01/15/2017 0702   LDLCALC 55 01/15/2017 0702      Wt Readings from Last 3 Encounters:  02/06/17 187 lb (84.8 kg)  02/05/17 190 lb (86.2 kg)  01/26/17 189 lb (85.7 kg)      Other studies Reviewed: Cardiac Cath 01/15/2017 Conclusion     Mid LAD to Dist LAD lesion, 95 %stenosed.  Prox LAD to Mid LAD lesion, 75 %stenosed.  Post intervention, there is a 0% residual stenosis.  A stent was successfully placed.  1st Diag lesion, 75 %stenosed.  Post intervention, there is a 0% residual stenosis.  A stent was successfully placed.     ASSESSMENT AND PLAN:  1.  CAD: Cath as above, stent to the proximal and mid LAD. I think he is experiencing dyspnea  and chest pressure from Brilinta. On review of his symptoms, they occur after taking this medication within 30 minutes. I have asked him to stop the Brilinta and start Plavix 75 mg at 6 pm. He will take it tonight. I have advised him that I would like to try this change before thinking of re-study with cath.  He is willing to try changing this medication. Will see him in a month to assess his response. He will call us in a week to tell us how he is doing. Continue statin, ACE, ASA  and BB.   2, Hypertension; BP is well controlled currently. Will. Continue his other regimen without changes.      Current medicines  are reviewed at length with the patient today.     Bettey Mare. Liborio Nixon, ANP, AACC   02/06/2017 5:03 PM    [facility] [address], [place], [place] [address] Phone: [phone]; Fax: [phone]

## 2017-03-09 ENCOUNTER — Ambulatory Visit (INDEPENDENT_AMBULATORY_CARE_PROVIDER_SITE_OTHER): Payer: Medicare HMO | Admitting: Adult Health

## 2017-03-09 ENCOUNTER — Encounter: Payer: Self-pay | Admitting: Adult Health

## 2017-03-09 VITALS — BP 128/68 | HR 49 | Ht 70.0 in | Wt 190.0 lb

## 2017-03-09 DIAGNOSIS — I2510 Atherosclerotic heart disease of native coronary artery without angina pectoris: Secondary | ICD-10-CM | POA: Diagnosis not present

## 2017-03-09 DIAGNOSIS — I10 Essential (primary) hypertension: Secondary | ICD-10-CM | POA: Diagnosis not present

## 2017-03-09 DIAGNOSIS — E7800 Pure hypercholesterolemia, unspecified: Secondary | ICD-10-CM | POA: Diagnosis not present

## 2017-03-09 MED ORDER — CLOPIDOGREL BISULFATE 75 MG PO TABS: 75.0000 mg | ORAL_TABLET | Freq: Every day | ORAL | 3 refills | Status: DC

## 2017-03-09 MED ORDER — LISINOPRIL 5 MG PO TABS: 5.0000 mg | ORAL_TABLET | Freq: Every day | ORAL | 3 refills | Status: DC

## 2017-03-09 MED ORDER — METOPROLOL TARTRATE 25 MG PO TABS: 25.0000 mg | ORAL_TABLET | Freq: Two times a day (BID) | ORAL | 3 refills | Status: DC

## 2017-03-09 MED ORDER — ATORVASTATIN CALCIUM 80 MG PO TABS: 80.0000 mg | ORAL_TABLET | Freq: Every day | ORAL | 3 refills | Status: DC

## 2017-03-09 NOTE — Progress Notes (Signed)
Cardiology Office Note   Date:  03/09/2017   ID:  Shawn Lopez, Shawn Lopez 1941/04/11, MRN [id number]  PCP:  [name], MD  Cardiologist:  [name]   Chief Complaint  Patient presents with  . Hypertension  . Atrial Fibrillation  . Coronary Artery Disease      History of Present Illness: Shawn Lopez is a 76 y.o. male who presents for ongoing assessment and management of hypertension, PAF, CAD with history of non-STEMI, stent to the proximal and mid LAD. The patient was last seen on 02/06/2017 post hospitalization after stent placement. At that time he was complaining of dyspnea. He was taken off of Brilinta as this may have been causing some of the side effects, and started on Plavix 75 mg daily. He is otherwise to continue statin Ace aspirin and beta blocker. The pressure was well-controlled.  He returns today feeling much better off of Brilinta. He is tolerating Plavix without bleeding or bruising. He states his breathing status has returned to normal, his energy level has returned to normal, he is able to sleep at night, and is grateful for the medication change. He states, that on occasion while lying down in bed at night he will feel irregularity in his heart rhythm, some extra beats, but otherwise he is feeling very well.  Past Medical History:  Diagnosis Date  . Arthritis   . Atrial fibrillation (HCC) 2006   Echocardiogram in 2006-mild LVH with normal EF  . Borderline hypertension   . CAD (coronary artery disease)    a. 12/2016: cath showing 95% mid-LAD stenosis, 75% prox-LAD stenosis and 75% 1st Diag stenosis. DES to the proximal and mid-LAD along with the 1st Diagonal.  . Chest pain 2001   negative stress nuclear in 2006; negative coronary angiography(no documentation); essentially normal PFTs in 07/2004; CT in 2012-lipomatous hypertrophy; mild atherosclerosis of the aorta and coronaries; mild dependent pulmonary scarring or atelectasis; small area of focal pleural  thickening; chronic bronchitic changes on chest x-ray  . Chronic back pain    Prior trauma with vertebral fracture  . Diabetes mellitus   . Diabetes mellitus    CBGs 150-170 in 2011; 2112- treated with single oral agent  . Hyperlipidemia   . Hypertension   . Osteopenia    by bone densitometry  . 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 APPENDECTOMY  1960  . CATARACT EXTRACTION  2012   right eye,lens implantation  . CHOLECYSTECTOMY    . CIRCUMCISION  2010   meatotomy and dilatation  . COLONOSCOPY N/A 01/14/2013   Procedure: COLONOSCOPY;  Surgeon: Dalia Heading, MD;  Location[facility];  Service: Gastroenterology;  Laterality: N/A;  . CORONARY ANGIOGRAPHY N/A 01/15/2017   Procedure: Coronary Angiography;  Surgeon: Runell Gess, MD;  Location: [facility] INVASIVE CV LAB;  Service: Cardiovascular;  Laterality: N/A;  . CORONARY STENT INTERVENTION N/A 01/15/2017   Procedure: Coronary Stent Intervention;  Surgeon: Runell Gess, MD;  Location: [facility] INVASIVE CV LAB;  Service: Cardiovascular;  Laterality: N/A;     Current Outpatient Prescriptions  Medication Sig Dispense Refill  . aspirin EC 81 MG EC tablet Take 1 tablet (81 mg total) by mouth daily. 30 tablet 0  . atorvastatin (LIPITOR) 80 MG tablet Take 1 tablet (80 mg total) by mouth daily at 6 PM. 30 tablet 0  . clopidogrel (PLAVIX) 75 MG tablet Take 1 tablet (75 mg total) by mouth daily. 90 tablet  3  . lisinopril (PRINIVIL,ZESTRIL) 5 MG tablet Take 1 tablet (5 mg total) by mouth daily. 30 tablet 0  . LORazepam (ATIVAN) 1 MG tablet Take 0.5 tablets (0.5 mg total) by mouth at bedtime as needed for anxiety. 7 tablet 0  . metFORMIN (GLUCOPHAGE) 500 MG tablet Take 1,000 mg by mouth 2 (two) times daily with a meal.    . metoprolol tartrate (LOPRESSOR) 25 MG tablet Take 25 mg by mouth 2 (two) times daily.      . nitroGLYCERIN (NITROSTAT) 0.4 MG SL tablet Place 1 tablet (0.4 mg total)  under the tongue every 5 (five) minutes as needed for chest pain. For 3 doses only 30 tablet 0   No current facility-administered medications for this visit.     Allergies:   Patient has no known allergies.    Social History:  The patient  reports that he quit smoking about 19 years ago. His smoking use included Cigarettes. He has a 22.50 pack-year smoking history. He has never used smokeless tobacco. He reports that he does not drink alcohol or use drugs.   Family History:  The patient's family history includes CAD in his father; Heart attack in his brother.    ROS: All other systems are reviewed and negative. Unless otherwise mentioned in H&P    PHYSICAL EXAM: VS:  BP 128/68   Pulse (!) 49   Ht 5\' 10"  (1.778 m)   Wt 190 lb (86.2 kg)   SpO2 95% Comment: on room air  BMI 27.26 kg/m  , BMI Body mass index is 27.26 kg/m. GEN: Well nourished, well developed, in no acute distress  HEENT: normal  Neck: no JVD, carotid bruits, or masses Cardiac: RRR; no murmurs, rubs, or gallops,no edema  Respiratory:  clear to auscultation bilaterally, normal work of breathing GI: soft, nontender, nondistended, + BS MS: no deformity or atrophy  Skin: warm and dry, no rash Neuro:  Strength and sensation are intact Psych: euthymic mood, full affect   Recent Labs: 01/15/2017: Magnesium 2.2 01/16/2017: BUN 10; Creatinine, Ser 0.74; Hemoglobin 15.0; Platelets 184; Potassium 3.8; Sodium 135    Lipid Panel    Component Value Date/Time   CHOL 123 01/15/2017 0702   TRIG 158 (H) 01/15/2017 0702   HDL 36 (L) 01/15/2017 0702   CHOLHDL 3.4 01/15/2017 0702   VLDL 32 01/15/2017 0702   LDLCALC 55 01/15/2017 0702      Wt Readings from Last 3 Encounters:  03/09/17 190 lb (86.2 kg)  02/06/17 187 lb (84.8 kg)  02/05/17 190 lb (86.2 kg)      Other studies Reviewed: Echocardiogram 01/14/2017 Left ventricle: The cavity size was normal. There was mild focal   basal hypertrophy of the septum. Systolic  function was normal.   The estimated ejection fraction was in the range of 55% to 60%.   Wall motion was normal; there were no regional wall motion   abnormalities. Doppler parameters are consistent with abnormal   left ventricular relaxation (grade 1 diastolic dysfunction).   Doppler parameters are consistent with high ventricular filling   pressure. - Aortic valve: There was trivial regurgitation. - Mitral valve: Calcified annulus. Mildly thickened leaflets . - Left atrium: The atrium was moderately dilated. - Atrial septum: There was increased thickness of the septum,   consistent with lipomatous hypertrophy. - Pulmonary arteries: Systolic pressure was mildly increased. PA   peak pressure: 32 mm Hg (S).  Cardiac Cath 01/15/2017 Conclusion     [facility]  [facility] lesion, 95 %stenosed.  Prox LAD to Mid LAD lesion, 75 %stenosed.  Post intervention, there is a 0% residual stenosis.  A stent was successfully placed.  1st Diag lesion, 75 %stenosed.  Post intervention, there is a 0% residual stenosis.  A stent was successfully placed.     ASSESSMENT AND PLAN:  1. Coronary artery disease: Status post drug-eluting stent to the mid and distal LAD, with drug-eluting stent to the first diagonal lesion. He was intolerant to Brilinta which was causing significant dyspnea. Discontinuing this and placing him on Plavix has completely eliminated his symptoms and he is feeling much better and now more energetic. I will not make any further medication changes at this time as he is doing well. I have given him new prescription refills on all cardiac medications, he prefers 90 day supply.  2. Hypertension: Blood pressure is currently very well-controlled on medication regimen.  3. Hypercholesterolemia: Continue atorvastatin 80 mg daily. He has no complaints of myalgia. Follow-up labs are completed by PCP and he will see Dr. Sudie Bailey within the next 3 months.   Current medicines are reviewed  at length with the patient today.  We'll see him in 6 months unless he is symptomatic. No labs are done by primary care at that time we will repeat fasting lipids LFTs, CBC, and BMET as he is on multiple medications.  Labs/ tests ordered today include:  Bettey Mare. Liborio Nixon, ANP, AACC   03/09/2017 1:20 PM    [facility] [address], [place], [place] [address] Phone: [phone]; Fax: [phone]

## 2017-03-09 NOTE — Patient Instructions (Signed)
Medication Instructions:   Your physician recommends that you continue on your current medications as directed. Please refer to the Current Medication list given to you today.  Labwork:  NONE  Testing/Procedures:  NONE  Follow-Up:  Your physician recommends that you schedule a follow-up appointment in: 6 months. You will receive a reminder letter in the mail in about 4 months reminding you to call and schedule your appointment. If you don't receive this letter, please contact our office.  Any Other Special Instructions Will Be Listed Below (If Applicable).  90 day supply refills sent today on all of your cardiac medications  If you need a refill on your cardiac medications before your next appointment, please call your pharmacy.

## 2017-05-07 ENCOUNTER — Ambulatory Visit: Payer: Commercial Managed Care - HMO | Admitting: Cardiology

## 2017-06-05 ENCOUNTER — Encounter (HOSPITAL_COMMUNITY): Payer: Self-pay

## 2017-06-05 ENCOUNTER — Emergency Department (HOSPITAL_COMMUNITY): Payer: Medicare HMO

## 2017-06-05 ENCOUNTER — Observation Stay (HOSPITAL_COMMUNITY)
Admission: EM | Admit: 2017-06-05 | Discharge: 2017-06-06 | Disposition: A | Discharge status: Home / Self Care | Payer: Medicare HMO | Attending: Internal Medicine | Admitting: Internal Medicine

## 2017-06-05 DIAGNOSIS — I2510 Atherosclerotic heart disease of native coronary artery without angina pectoris: Secondary | ICD-10-CM | POA: Diagnosis present

## 2017-06-05 DIAGNOSIS — R079 Chest pain, unspecified: Secondary | ICD-10-CM | POA: Diagnosis present

## 2017-06-05 DIAGNOSIS — I10 Essential (primary) hypertension: Secondary | ICD-10-CM | POA: Insufficient documentation

## 2017-06-05 DIAGNOSIS — E119 Type 2 diabetes mellitus without complications: Secondary | ICD-10-CM | POA: Diagnosis not present

## 2017-06-05 DIAGNOSIS — R072 Precordial pain: Principal | ICD-10-CM | POA: Insufficient documentation

## 2017-06-05 DIAGNOSIS — I4891 Unspecified atrial fibrillation: Secondary | ICD-10-CM | POA: Insufficient documentation

## 2017-06-05 DIAGNOSIS — E1169 Type 2 diabetes mellitus with other specified complication: Secondary | ICD-10-CM

## 2017-06-05 DIAGNOSIS — I252 Old myocardial infarction: Secondary | ICD-10-CM | POA: Insufficient documentation

## 2017-06-05 DIAGNOSIS — Z87891 Personal history of nicotine dependence: Secondary | ICD-10-CM | POA: Diagnosis not present

## 2017-06-05 DIAGNOSIS — Z955 Presence of coronary angioplasty implant and graft: Secondary | ICD-10-CM | POA: Insufficient documentation

## 2017-06-05 DIAGNOSIS — E785 Hyperlipidemia, unspecified: Secondary | ICD-10-CM | POA: Insufficient documentation

## 2017-06-05 LAB — BASIC METABOLIC PANEL
ANION GAP: 8 (ref 5–15)
BUN: 13 mg/dL (ref 6–20)
CALCIUM: 9.4 mg/dL (ref 8.9–10.3)
CO2: 25 mmol/L (ref 22–32)
CREATININE: 0.87 mg/dL (ref 0.61–1.24)
Chloride: 104 mmol/L (ref 101–111)
GFR calc Af Amer: >60 mL/min (ref >60)
GFR calc non Af Amer: >60 mL/min (ref >60)
GLUCOSE: 121 mg/dL — AB (ref 65–99)
Potassium: 3.9 mmol/L (ref 3.5–5.1)
Sodium: 137 mmol/L (ref 135–145)

## 2017-06-05 LAB — TROPONIN I: Troponin I: <0.03 ng/mL (ref <0.03)

## 2017-06-05 LAB — CBC
HCT: 42.7 % (ref 39.0–52.0)
HEMOGLOBIN: 14.5 g/dL (ref 13.0–17.0)
MCH: 30.5 pg (ref 26.0–34.0)
MCHC: 34 g/dL (ref 30.0–36.0)
MCV: 89.9 fL (ref 78.0–100.0)
Platelets: 170 10*3/uL (ref 150–400)
RBC: 4.75 MIL/uL (ref 4.22–5.81)
RDW: 13.2 % (ref 11.5–15.5)
WBC: 8.3 10*3/uL (ref 4.0–10.5)

## 2017-06-05 LAB — TSH: TSH: 2.751 u[IU]/mL (ref 0.350–4.500)

## 2017-06-05 MED ORDER — CLOPIDOGREL BISULFATE 75 MG PO TABS
75.0000 mg | ORAL_TABLET | Freq: Every day | ORAL | Status: DC
  Administered 2017-06-05 – 2017-06-06 (×2): 75 mg via ORAL
  Filled 2017-06-05 (×2): qty 1

## 2017-06-05 MED ORDER — ASPIRIN EC 81 MG PO TBEC
81.0000 mg | DELAYED_RELEASE_TABLET | Freq: Every day | ORAL | Status: DC
Start: 2017-06-06 — End: 2017-06-06
  Administered 2017-06-06: 81 mg via ORAL
  Filled 2017-06-05: qty 1

## 2017-06-05 MED ORDER — METOPROLOL TARTRATE 25 MG PO TABS
25.0000 mg | ORAL_TABLET | Freq: Two times a day (BID) | ORAL | Status: DC
  Administered 2017-06-05 – 2017-06-06 (×2): 25 mg via ORAL
  Filled 2017-06-05 (×2): qty 1

## 2017-06-05 MED ORDER — NITROGLYCERIN 2 % TD OINT
1.0000 [in_us] | TOPICAL_OINTMENT | Freq: Once | TRANSDERMAL | Status: AC
  Administered 2017-06-05: 1 [in_us] via TOPICAL
  Filled 2017-06-05: qty 1

## 2017-06-05 MED ORDER — ASPIRIN 81 MG PO CHEW
324.0000 mg | CHEWABLE_TABLET | Freq: Once | ORAL | Status: AC
  Administered 2017-06-05: 324 mg via ORAL
  Filled 2017-06-05: qty 4

## 2017-06-05 MED ORDER — ATORVASTATIN CALCIUM 40 MG PO TABS
80.0000 mg | ORAL_TABLET | Freq: Every day | ORAL | Status: DC
  Administered 2017-06-05: 80 mg via ORAL
  Filled 2017-06-05: qty 2

## 2017-06-05 MED ORDER — LISINOPRIL 5 MG PO TABS
5.0000 mg | ORAL_TABLET | Freq: Every day | ORAL | Status: DC
  Administered 2017-06-06: 5 mg via ORAL
  Filled 2017-06-05: qty 1

## 2017-06-05 MED ORDER — ENOXAPARIN SODIUM 40 MG/0.4ML ~~LOC~~ SOLN
40.0000 mg | SUBCUTANEOUS | Status: DC
  Administered 2017-06-06: 40 mg via SUBCUTANEOUS
  Filled 2017-06-05: qty 0.4

## 2017-06-05 MED ORDER — NITROGLYCERIN 0.4 MG SL SUBL: 0.4000 mg | SUBLINGUAL_TABLET | SUBLINGUAL | Status: DC | PRN

## 2017-06-05 MED ORDER — INSULIN ASPART 100 UNIT/ML ~~LOC~~ SOLN
0.0000 [IU] | Freq: Three times a day (TID) | SUBCUTANEOUS | Status: DC
  Administered 2017-06-06 (×2): 2 [IU] via SUBCUTANEOUS

## 2017-06-05 MED ORDER — LORAZEPAM 0.5 MG PO TABS: 0.5000 mg | ORAL_TABLET | Freq: Every evening | ORAL | Status: DC | PRN

## 2017-06-05 NOTE — ED Notes (Signed)
EDP at the bedside, pt will be admitted, ok for wife to get pt food

## 2017-06-05 NOTE — H&P (Signed)
History and Physical    Shawn Lopez ZOX:[id number] DOB: [birth year] DOA: 06/05/2017  PCP: Gareth MorganKnowlton, [name], MD   Patient coming from: Home   Chief Complaint: Chest Pain  HPI: Shawn Lopez is a 76 y.o. male with medical history significant for NSTEMI- with recent PCI with stents 12/2016, DM, HTN, who presented to the Ed with c/o chest pain, slightly left of sternum, that started early this am, and woke him up from sleep at about 5 am this morning. Patient described chest pain as a tightness, 2-3/10. He took his nitro with improvement in pain to a 1/10. Pain is now just a mild ache.. Patient denies pain radiating to his head or arm. No associated SOB, palpitations, diaphoresis, nausea or vomiting. Pt  Reports this pain is not similar to the pain he had in April when he had a heart attack.  Pt denies fever, chills or cough. Quit smokinig ~8958yrs ago.  Pt reports compliance with all his medications- including aspirin and plavix. He was previously on brillinta, but this was d/c 5/15/198, as pt reported SOB, which resolved with d/c of brillinta. He has done well since then. Patient has also followed up with cardiology X2 since April.  Over the past months patient reports he has been feeling tired at the end of the day, after his usual 10-12 hour work day. Patient's wife initially brought this up. He later agreed to feeling tiredness all over without focal weakness, no shortness of breath or lightheadedness, no prior chest pains, no swelling of extremities, no vomiting nausea, no abd pain.   ED Course: pulse- 48- 50, recording of 84 seems spurious, bradycardia appears old. Bp elevated- 140s- 170s. Trop X1- <0.03, EKG- non-acute, electrolytes, creatinine, CBc unremarkable. Chest xray moderate changes of chronic bronchitis and or asthma. Aspirin 324mg  given in ED.  Review of Systems:  CONSTITUTIONAL- No Fever, weightloss, night sweat or change in appetite. SKIN- No Rash, colour changes or  itching. HEAD- No Headache or dizziness. Mouth/throat- No Sorethroat, or bleeding gums. RESPIRATORY- No Cough or SOB. GI- No nausea, vomiting, diarrhoea, constipation, abd pain. URINARY- No Frequency, urgency, straining or dysuria. NEUROLOGIC- No Numbness, syncope, seizures or burning. [facility] Den[facility] depression or anxiety.  Past Medical History:  Diagnosis Date  . Arthritis   . Atrial fibrillation (HCC) 2006   Echocardiogram in 2006-mild LVH with normal EF  . Borderline hypertension   . CAD (coronary artery disease)    a. 12/2016: cath showing 95% mid-LAD stenosis, 75% prox-LAD stenosis and 75% 1st Diag stenosis. DES to the proximal and mid-LAD along with the 1st Diagonal.  . Chest pain 2001   negative stress nuclear in 2006; negative coronary angiography(no documentation); essentially normal PFTs in 07/2004; CT in 2012-lipomatous hypertrophy; mild atherosclerosis of the aorta and coronaries; mild dependent pulmonary scarring or atelectasis; small area of focal pleural thickening; chronic bronchitic changes on chest x-ray  . Chronic back pain    Prior trauma with vertebral fracture  . Diabetes mellitus   . Diabetes mellitus    CBGs 150-170 in 2011; 2112- treated with single oral agent  . Hyperlipidemia   . Hypertension   . Osteopenia    by bone densitometry  . 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 APPENDECTOMY  1960  . CATARACT EXTRACTION  2012   right eye,lens implantation  . CHOLECYSTECTOMY    . CIRCUMCISION  2010   meatotomy and dilatation  . COLONOSCOPY N/A 01/14/2013  Procedure: COLONOSCOPY;  Surgeon: Dalia Heading, MD;  Location: [facility];  Service: Gastroenterology;  Laterality: N/A;  . CORONARY ANGIOGRAPHY N/A 01/15/2017   Procedure: Coronary Angiography;  Surgeon: Runell Gess, MD;  Location: [facility] INVASIVE CV LAB;  Service: Cardiovascular;  Laterality: N/A;  . CORONARY STENT INTERVENTION N/A 01/15/2017    Procedure: Coronary Stent Intervention;  Surgeon: Runell Gess, MD;  Location: [facility] INVASIVE CV LAB;  Service: Cardiovascular;  Laterality: N/A;    reports that he quit smoking about 20 years ago. His smoking use included Cigarettes. He has a 22.50 pack-year smoking history. He has never used smokeless tobacco. He reports that he does not drink alcohol or use drugs.  Allergies  Allergen Reactions  . Brilinta [Ticagrelor]     Dyspnea     Family History  Problem Relation Age of Onset  . CAD Father   . Heart attack Brother    Prior to Admission medications   Medication Sig Start Date End Date Taking? Authorizing Provider  aspirin EC 81 MG EC tablet Take 1 tablet (81 mg total) by mouth daily. 01/17/17  Yes Ghimire, Werner Lean, MD  atorvastatin (LIPITOR) 80 MG tablet Take 1 tablet (80 mg total) by mouth daily at 6 PM. 03/09/17  Yes Jodelle Gross, NP  clopidogrel (PLAVIX) 75 MG tablet Take 1 tablet (75 mg total) by mouth daily. 03/09/17  Yes Jodelle Gross, NP  lisinopril (PRINIVIL,ZESTRIL) 5 MG tablet Take 1 tablet (5 mg total) by mouth daily. 03/09/17  Yes Jodelle Gross, NP  metFORMIN (GLUCOPHAGE) 500 MG tablet Take 1,000 mg by mouth 2 (two) times daily with a meal.   Yes [provider]  metoprolol tartrate (LOPRESSOR) 25 MG tablet Take 1 tablet (25 mg total) by mouth 2 (two) times daily. 03/09/17  Yes Jodelle Gross, NP  nitroGLYCERIN (NITROSTAT) 0.4 MG SL tablet Place 1 tablet (0.4 mg total) under the tongue every 5 (five) minutes as needed for chest pain. For 3 doses only 01/16/17  Yes Ghimire, Werner Lean, MD  LORazepam (ATIVAN) 1 MG tablet Take 0.5 tablets (0.5 mg total) by mouth at bedtime as needed for anxiety. Patient not taking: Reported on 06/05/2017 02/05/17   Raeford Razor, MD    Physical Exam: Vitals:   06/05/17 1433 06/05/17 1451 06/05/17 1530 06/05/17 1600  BP: (!) 175/69 (!) 161/74 (!) 157/77 (!) 149/83  Pulse: (!) 50 (!) 48 84 (!) 48  Resp: Temp: 98.4 F (36.9 C)     TempSrc: Oral     SpO2: 97% 98% (!) 84% 98%  Weight:      Height:       Constitutional: NAD, calm, comfortable Vitals:   06/05/17 1433 06/05/17 1451 06/05/17 1530 06/05/17 1600  BP: (!) 175/69 (!) 161/74 (!) 157/77 (!) 149/83  Pulse: (!) 50 (!) 48 84 (!) 48  Resp: Temp: 98.4 F (36.9 C)     TempSrc: Oral     SpO2: 97% 98% (!) 84% 98%  Weight:      Height:       Eyes: PERRL, lids and conjunctivae normal ENMT: Mucous membranes are moist. Posterior pharynx clear of any exudate or lesions.Normal dentition.  Neck: normal, supple, no masses, no thyromegaly Respiratory: clear to auscultation bilaterally, no wheezing, no crackles. Normal respiratory effort. No accessory muscle use.  Cardiovascular: Regular rate and rhythm, no murmurs / rubs / gallops. No extremity edema. 2+ pedal  pulses.  Abdomen: no tenderness, no masses palpated. No hepatosplenomegaly. Bowel sounds positive.  Musculoskeletal: no clubbing / cyanosis. No joint deformity upper and lower extremities. Good ROM, no contractures. Normal muscle tone.  Skin: no rashes, lesions, ulcers. No induration Neurologic: CN 2-12 grossly intact. Sensation intact, DTR normal. Strength 5/5 in all 4.  Psychiatric: Normal judgment and insight. Alert and oriented x 3. Normal mood.   Labs on Admission: I have personally reviewed following labs and imaging studies  CBC:  Recent Labs Lab 06/05/17 1430  WBC 8.3  HGB 14.5  HCT 42.7  MCV 89.9  PLT 170   Basic Metabolic Panel:  Recent Labs Lab 06/05/17 1430  NA 137  K 3.9  CL 104  CO2 25  GLUCOSE 121*  BUN 13  CREATININE 0.87  CALCIUM 9.4   Cardiac Enzymes:  Recent Labs Lab 06/05/17 1430  TROPONINI <0.03   Urine analysis:    Component Value Date/Time   COLORURINE YELLOW 12/19/2008 1117   APPEARANCEUR CLOUDY (A) 12/19/2008 1117   LABSPEC 1.015 12/19/2008 1117   PHURINE 6.5 12/19/2008 1117   GLUCOSEU NEGATIVE 12/19/2008  1117   HGBUR TRACE (A) 12/19/2008 1117   BILIRUBINUR NEGATIVE 12/19/2008 1117   KETONESUR NEGATIVE 12/19/2008 1117   PROTEINUR NEGATIVE 12/19/2008 1117   UROBILINOGEN 0.2 12/19/2008 1117   NITRITE POSITIVE (A) 12/19/2008 1117   LEUKOCYTESUR SMALL (A) 12/19/2008 1117    Radiological Exams on Admission: Dg Chest 2 View  Result Date: 06/05/2017 CLINICAL DATA:  Acute onset of chest pain that awakened the patient at approximately 4 o'clock a.m. this morning. Current history of coronary artery disease with prior stenting. EXAM: CHEST  2 VIEW COMPARISON:  01/13/2017, 12/13/2012 and earlier, including CT chest 03/17/2011. FINDINGS: Suboptimal inspiration accounts for mild bibasilar atelectasis, left greater than right, and accentuates the cardiac silhouette. Taking this into account, cardiac silhouette moderately enlarged, unchanged. Thoracic aorta tortuous and atherosclerotic, unchanged. LAD coronary stent noted. Hilar and mediastinal contours otherwise unremarkable. Lungs otherwise clear. Pulmonary vascularity normal. Moderate central peribronchial thickening, similar to the prior examinations. No pleural effusions. Degenerative changes and DISH involving the thoracic spine. Remote fractures involving T12 and L1, unchanged. IMPRESSION: 1. Suboptimal inspiration accounts for mild bibasilar atelectasis. No acute cardiopulmonary disease otherwise. 2. Moderate changes of chronic bronchitis and/or asthma without focal pneumonia. 3. Stable cardiomegaly without pulmonary edema. 4.  Aortic Atherosclerosis (ICD10-170.0) Electronically Signed   By: Hulan Saas M.D.   On: 06/05/2017 14:53    EKG: Independently reviewed. Nonspecific T wave changes- flattening V3 through V6.  Assessment/Plan Principal Problem:   Chest pain Active Problems:   Atrial fibrillation (HCC)   Essential hypertension   Hyperlipidemia LDL goal <70   Diabetes mellitus (HCC)   CAD (coronary artery disease) with PCI 12/2016  Chest  Pain- with hx of CAD- Trop X1- neg. EKG- with T wave flattening V3- V6. Pt with recent PCI- 12/2016- PCI, DES to LAD and diagonal. Last ECHO- 01/14/17- prior to PCI, EF- 55-60%, G1DD. Pt reports complaince with dual antiplatelet- Aspirinm and plavix.  Brillinta d/c 01/2017 with c/o SOB. Improved with Plavix.  - Trop X2 - EKG Am - Cardiology consult am - Need ECHO? follow cards recs. - Cont dual antiplt- aspirin and plavix - Cont Statin lipitior 80 - Continue low-dose lisinopril 5 mg and metoprolol 25 mg twice a day. - NPO midnight  Fatigue- review of systems negative. With good appetite. No significant weight change. - TSH - PT evaluation  HTN- Bp elevated  in ED. Was WNL on visit with Cardiologist 02/2017. - Cont previous regimen for now- lisinopril 5 mg  And metoprolol 25 twice a day(heart rate is 48-50 not new, asymptomatic)  DM- glucose 121. No hemoglobin A1c on file. Home medications metformin held. - SSi while inpatient  DVT prophylaxis: Lovenox Code Status: Full Family Communication: Significant other at bedside Disposition Plan: To be determined Consults called: Cardiology consult a.m. Admission status: Observation telemetry  Onnie Boer MD [facility] Pager [phone]  If 7PM-7AM, please contact night-coverage [email or web address] Password [facility]  06/05/2017, 4:39 PM

## 2017-06-05 NOTE — ED Provider Notes (Signed)
Emergency Department Provider Note   I have reviewed the triage vital signs and the nursing notes.   HISTORY  Chief Complaint Chest Pain   HPI Shawn Lopez is a 76 y.o. male with PMH of CAD s/p stent placement 12/2016, a-fib, DM, HTN, and HLD lens to the emergency department for evaluation of left-sided chest pain and pressure. Symptoms woke him from sleep this morning. Patient went to work which did not exacerbate or alleviate symptoms. He took nitroglycerin which improved his pain somewhat but it since returned. No exertional or pleuritic factors. Patient states it feels somewhat similar to his last CAD episode which led to stent placement. Patient also has an uncomfortable feeling in the left upper, inner arm. Pain is not worse with movement. Sensation does not extend down to the forearm or hand. Patient denies neck pain. No associated dyspnea, nausea, vomiting.   Past Medical History:  Diagnosis Date  . Arthritis   . Atrial fibrillation (HCC) 2006   Echocardiogram in 2006-mild LVH with normal EF  . Borderline hypertension   . CAD (coronary artery disease)    a. 12/2016: cath showing 95% mid-LAD stenosis, 75% prox-LAD stenosis and 75% 1st Diag stenosis. DES to the proximal and mid-LAD along with the 1st Diagonal.  . Chest pain 2001   negative stress nuclear in 2006; negative coronary angiography(no documentation); essentially normal PFTs in 07/2004; CT in 2012-lipomatous hypertrophy; mild atherosclerosis of the aorta and coronaries; mild dependent pulmonary scarring or atelectasis; small area of focal pleural thickening; chronic bronchitic changes on chest x-ray  . Chronic back pain    Prior trauma with vertebral fracture  . Diabetes mellitus   . Diabetes mellitus    CBGs 150-170 in 2011; 2112- treated with single oral agent  . Hyperlipidemia   . Hypertension   . Osteopenia    by bone densitometry  . 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 Chest pain 01/13/2017  . NSTEMI (non-ST elevated myocardial infarction) (HCC) 01/13/2017  . Pain in joint, shoulder region 10/20/2013  . Muscle spasm of right shoulder 10/20/2013  . Right rotator cuff tear 10/14/2013  . 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 Atrial fibrillation (HCC)   . Essential hypertension   . Hyperlipidemia LDL goal <70   . Diabetes mellitus (HCC)   . Osteopenia   . Chronic back pain   . Chest pain     Past Surgical History:  Procedure Laterality Date  . APPENDECTOMY  1960  . CATARACT EXTRACTION  2012   right eye,lens implantation  . CHOLECYSTECTOMY    . CIRCUMCISION  2010   meatotomy and dilatation  . COLONOSCOPY N/A 01/14/2013   Procedure: COLONOSCOPY;  Surgeon: Dalia HeadingMark A Jenkins, MD;  Location: [facility];  Service: Gastroenterology;  Laterality: N/A;  . CORONARY ANGIOGRAPHY N/A 01/15/2017   Procedure: Coronary Angiography;  Surgeon: Runell GessJonathan J Berry, MD;  [facility];  Service: Cardiovascular;  Laterality: N/A;  . CORONARY STENT INTERVENTION N/A 01/15/2017   Procedure: Coronary Stent Intervention;  Surgeon: Runell GessJonathan J Berry, MD;  Location: [facility] INVASIVE CV LAB;  Service: Cardiovascular;  Laterality: N/A;    Current Outpatient Rx  . Order #: [id number] Class: Print  . Order #: [id number] Class: Normal  . Order #: [id number] Class: Normal  . Order #: [id number] Class: Normal  . Order #: [id number] Class: Historical Med  . Order #: [id number] Class: Normal  . Order #: [id number] Class: Print  .  Order #: [id number] Class: Print    Allergies Brilinta [ticagrelor]  Family History  Problem Relation Age of Onset  . CAD Father   . Heart attack Brother     Social History Social History  Substance Use Topics  . Smoking status: Former Smoker    Packs/day: 1.50    Years: 15.00    Types: Cigarettes    Quit date: 04/20/1997  . Smokeless tobacco: Never Used  . Alcohol use No    Review of Systems  Constitutional:  No fever/chills Eyes: No visual changes. ENT: No sore throat. Cardiovascular: Positive chest pain. Respiratory: Denies shortness of breath. Gastrointestinal: No abdominal pain.  No nausea, no vomiting.  No diarrhea.  No constipation. Genitourinary: Negative for dysuria. Musculoskeletal: Negative for back pain. Skin: Negative for rash. Neurological: Negative for headaches, focal weakness or numbness.  10-point ROS otherwise negative.  ____________________________________________   PHYSICAL EXAM:  VITAL SIGNS: ED Triage Vitals  Enc Vitals Group     BP 06/05/17 1433 (!) 175/69     Pulse Rate 06/05/17 1433 (!) 50     Resp 06/05/17 1433 18     Temp 06/05/17 1433 98.4 F (36.9 C)     Temp Source 06/05/17 1433 Oral     SpO2 06/05/17 1433 97 %     Weight 06/05/17 1428 190 lb (86.2 kg)     Height 06/05/17 1428  (1.778 m)     Pain Score 06/05/17 1428 1   Constitutional: Alert and oriented. Well appearing and in no acute distress. Eyes: Conjunctivae are normal.  Head: Atraumatic. Nose: No congestion/rhinnorhea. Mouth/Throat: Mucous membranes are moist.  Oropharynx non-erythematous. Neck: No stridor.   Cardiovascular: Normal rate, regular rhythm. Good peripheral circulation. Grossly normal heart sounds.   Respiratory: Normal respiratory effort.  No retractions. Lungs CTAB. Gastrointestinal: Soft and nontender. No distention.  Musculoskeletal: No lower extremity tenderness nor edema. No gross deformities of extremities. Neurologic:  Normal speech and language. No gross focal neurologic deficits are appreciated.  Skin:  Skin is warm, dry and intact. No rash noted.  ____________________________________________   LABS (all labs ordered are listed, but only abnormal results are displayed)  Labs Reviewed  BASIC METABOLIC PANEL - Abnormal; Notable for the following:       Result Value   Glucose, Bld 121 (*)    All other components within normal limits  CBC  TROPONIN I    ____________________________________________  EKG   EKG Interpretation  Date/Time:  Tuesday June 05 2017 14:30:30 EDT Ventricular Rate:  48 PR Interval:  150 QRS Duration: 100 QT Interval:  472 QTC Calculation: 421 R Axis:   34 Text Interpretation:  Sinus bradycardia Otherwise normal ECG No STEMI.  Confirmed by Alona Bene [phone]) on 06/05/2017 3:01:42 PM       ____________________________________________  RADIOLOGY  Dg Chest 2 View  Result Date: 06/05/2017 CLINICAL DATA:  Acute onset of chest pain that awakened the patient at approximately 4 o'clock a.m. this morning. Current history of coronary artery disease with prior stenting. EXAM: CHEST  2 VIEW COMPARISON:  01/13/2017, 12/13/2012 and earlier, including CT chest 03/17/2011. FINDINGS: Suboptimal inspiration accounts for mild bibasilar atelectasis, left greater than right, and accentuates the cardiac silhouette. Taking this into account, cardiac silhouette moderately enlarged, unchanged. Thoracic aorta tortuous and atherosclerotic, unchanged. LAD coronary stent noted. Hilar and mediastinal contours otherwise unremarkable. Lungs otherwise clear. Pulmonary vascularity normal. Moderate central peribronchial thickening, similar to the prior examinations. No pleural effusions. Degenerative changes and DISH involving the  thoracic spine. Remote fractures involving T12 and L1, unchanged. IMPRESSION: 1. Suboptimal inspiration accounts for mild bibasilar atelectasis. No acute cardiopulmonary disease otherwise. 2. Moderate changes of chronic bronchitis and/or asthma without focal pneumonia. 3. Stable cardiomegaly without pulmonary edema. 4.  Aortic Atherosclerosis (ICD10-170.0) Electronically Signed   By: Hulan Saas M.D.   On: 06/05/2017 14:53    ____________________________________________   PROCEDURES  Procedure(s) performed:   Procedures  None ____________________________________________   INITIAL IMPRESSION /  ASSESSMENT AND PLAN / ED COURSE  Pertinent labs & imaging results that were available during my care of the patient were reviewed by me and considered in my medical decision making (see chart for details).  Patient presents to the emergency department for evaluation of chest pain and left arm symptoms. Patient had stent placed in April 2018. Pain relieved somewhat with nitroglycerin but continues to have 3 out of 10 discomfort. Plan for nitroglycerin ointment, labs which are pending, and likely admission with high risk chest pain. Agents EKG shows no acute ischemia. Chest x-ray with no significant findings.   Discussed patient's case with Hospitalist, Dr. Mariea Clonts to request admission. Patient and family (if present) updated with plan. Care transferred to Hospitalist service.  I reviewed all nursing notes, vitals, pertinent old records, EKGs, labs, imaging (as available).  ____________________________________________  FINAL CLINICAL IMPRESSION(S) / ED DIAGNOSES  Final diagnoses:  Precordial chest pain     MEDICATIONS GIVEN DURING THIS VISIT:  Medications  nitroGLYCERIN (NITROGLYN) 2 % ointment 1 inch (1 inch Topical Given 06/05/17 1512)  aspirin chewable tablet 324 mg (324 mg Oral Given 06/05/17 1539)     NEW OUTPATIENT MEDICATIONS STARTED DURING THIS VISIT:  None  Note:  This document was prepared using Dragon voice recognition software and may include unintentional dictation errors.  Alona Bene, MD Emergency Medicine    Waylyn Tenbrink, Arlyss Repress, MD 06/05/17 [phone]

## 2017-06-05 NOTE — ED Triage Notes (Signed)
Patient reports of left side chest pain that woke him out of sleep last night 3/10 pain. States he took 1 nitro and pain decreased to 1/10. Patient reports pain as dull and started to have left arm numbness above left elbow on interior side of arm. Started approx. 1030. Patient has no deficits noted. EDP made aware.

## 2017-06-05 NOTE — ED Triage Notes (Signed)
Dr Particia NearingHaviland made aware of numbness in left arm.

## 2017-06-06 ENCOUNTER — Encounter (HOSPITAL_COMMUNITY): Payer: Self-pay

## 2017-06-06 ENCOUNTER — Other Ambulatory Visit: Payer: Self-pay

## 2017-06-06 ENCOUNTER — Observation Stay (HOSPITAL_BASED_OUTPATIENT_CLINIC_OR_DEPARTMENT_OTHER): Payer: Medicare HMO

## 2017-06-06 DIAGNOSIS — R072 Precordial pain: Secondary | ICD-10-CM

## 2017-06-06 DIAGNOSIS — E785 Hyperlipidemia, unspecified: Secondary | ICD-10-CM | POA: Diagnosis not present

## 2017-06-06 DIAGNOSIS — I10 Essential (primary) hypertension: Secondary | ICD-10-CM | POA: Diagnosis not present

## 2017-06-06 DIAGNOSIS — R5383 Other fatigue: Secondary | ICD-10-CM

## 2017-06-06 DIAGNOSIS — I480 Paroxysmal atrial fibrillation: Secondary | ICD-10-CM | POA: Diagnosis not present

## 2017-06-06 LAB — TROPONIN I: Troponin I: <0.03 ng/mL (ref <0.03)

## 2017-06-06 LAB — GLUCOSE, CAPILLARY
GLUCOSE-CAPILLARY: 171 mg/dL — AB (ref 65–99)
Glucose-Capillary: 157 mg/dL — ABNORMAL HIGH (ref 65–99)

## 2017-06-06 LAB — NM MYOCAR MULTI W/SPECT W/WALL MOTION / EF
CHL CUP NUCLEAR SDS: 0
CHL CUP RESTING HR STRESS: 40 {beats}/min
CSEPPHR: 62 {beats}/min
LHR: 0.41
LVDIAVOL: 101 mL (ref 62–150)
LVSYSVOL: 45 mL
SRS: 1
SSS: 1
TID: 1.17

## 2017-06-06 MED ORDER — ISOSORBIDE MONONITRATE ER 30 MG PO TB24: 30.0000 mg | ORAL_TABLET | Freq: Every day | ORAL | 1 refills | Status: DC

## 2017-06-06 MED ORDER — ISOSORBIDE MONONITRATE ER 60 MG PO TB24
30.0000 mg | ORAL_TABLET | Freq: Every day | ORAL | Status: DC
  Administered 2017-06-06: 30 mg via ORAL
  Filled 2017-06-06: qty 1

## 2017-06-06 MED ORDER — REGADENOSON 0.4 MG/5ML IV SOLN
INTRAVENOUS | Status: AC
  Administered 2017-06-06: 0.4 mg via INTRAVENOUS
  Filled 2017-06-06: qty 5

## 2017-06-06 MED ORDER — METOPROLOL TARTRATE 25 MG PO TABS: 12.5000 mg | ORAL_TABLET | Freq: Two times a day (BID) | ORAL | Status: DC

## 2017-06-06 MED ORDER — METOPROLOL TARTRATE 25 MG PO TABS: 12.5000 mg | ORAL_TABLET | Freq: Two times a day (BID) | ORAL | 0 refills | Status: DC

## 2017-06-06 MED ORDER — REGADENOSON 0.4 MG/5ML IV SOLN
0.4000 mg | Freq: Once | INTRAVENOUS | Status: AC
  Administered 2017-06-06: 0.4 mg via INTRAVENOUS
  Filled 2017-06-06: qty 5

## 2017-06-06 MED ORDER — SODIUM CHLORIDE 0.9% FLUSH
INTRAVENOUS | Status: AC
  Administered 2017-06-06: 10 mL via INTRAVENOUS
  Filled 2017-06-06: qty 10

## 2017-06-06 MED ORDER — TECHNETIUM TC 99M TETROFOSMIN IV KIT
30.0000 | PACK | Freq: Once | INTRAVENOUS | Status: AC | PRN
  Administered 2017-06-06: 33 via INTRAVENOUS

## 2017-06-06 MED ORDER — TECHNETIUM TC 99M TETROFOSMIN IV KIT
10.0000 | PACK | Freq: Once | INTRAVENOUS | Status: AC | PRN
  Administered 2017-06-06: 10.5 via INTRAVENOUS

## 2017-06-06 NOTE — Discharge Instructions (Signed)
·   Please note you were started on new medication called Imdur 30 mg tablet to take daily by mouth  Please also note that the dose of medication metoprolol was lowered from 25 mg down to 12.5 mg to take by mouth twice daily   Nonspecific Chest Pain Chest pain can be caused by many different conditions. There is a chance that your pain could be related to something serious, such as a heart attack or a blood clot in your lungs. Chest pain can also be caused by conditions that are not life-threatening. If you have chest pain, it is very important to follow up with your doctor. Follow these instructions at home: Medicines  If you were prescribed an antibiotic medicine, take it as told by your doctor. Do not stop taking the antibiotic even if you start to feel better.  Take over-the-counter and prescription medicines only as told by your doctor. Lifestyle  Do not use any products that contain nicotine or tobacco, such as cigarettes and e-cigarettes. If you need help quitting, ask your doctor.  Do not drink alcohol.  Make lifestyle changes as told by your doctor. These may include: ? Getting regular exercise. Ask your doctor for some activities that are safe for you. ? Eating a heart-healthy diet. A diet specialist (dietitian) can help you to learn healthy eating options. ? Staying at a healthy weight. ? Managing diabetes, if needed. ? Lowering your stress, as with deep breathing or spending time in nature. General instructions  Avoid any activities that make you feel chest pain.  If your chest pain is because of heartburn: ? Raise (elevate) the head of your bed about 6 inches (15 cm). You can do this by putting blocks under the bed legs at the head of the bed. ? Do not sleep with extra pillows under your head. That does not help heartburn.  Keep all follow-up visits as told by your doctor. This is important. This includes any further testing if your chest pain does not go away. Contact a  doctor if:  Your chest pain does not go away.  You have a rash with blisters on your chest.  You have a fever.  You have chills. Get help right away if:  Your chest pain is worse.  You have a cough that gets worse, or you cough up blood.  You have very bad (severe) pain in your belly (abdomen).  You are very weak.  You pass out (faint).  You have either of these for no clear reason: ? Sudden chest discomfort. ? Sudden discomfort in your arms, back, neck, or jaw.  You have shortness of breath at any time.  You suddenly start to sweat, or your skin gets clammy.  You feel sick to your stomach (nauseous).  You throw up (vomit).  You suddenly feel light-headed or dizzy.  Your heart starts to beat fast, or it feels like it is skipping beats. These symptoms may be an emergency. Do not wait to see if the symptoms will go away. Get medical help right away. Call your local emergency services (911 in the [place] Do not drive yourself to the hospital. This information is not intended to replace advice given to you by your health care provider. Make sure you discuss any questions you have with your health care provider. Document Released: 02/28/2008 Document Revised: 06/05/2016 Document Reviewed: 06/05/2016 Elsevier Interactive Patient Education  2017 [employer]

## 2017-06-06 NOTE — Care Management Obs Status (Signed)
MEDICARE OBSERVATION STATUS NOTIFICATION   Patient Details  Name: Shawn Lopez MRN: [id number] Date of Birth: 10-12-1940   Medicare Observation Status Notification Given:  Yes    Larico Dimock, Chrystine OilerSharley Diane, RN 06/06/2017, 11:31 AM

## 2017-06-06 NOTE — Progress Notes (Signed)
1550 d/c instructions, paperwork and hard Rxs given to patient and patient's wife. IV catheter removed from RIGHT arm, intact with no s/s of infiltration/infection noted. Patient assisted to vehicle via w/c by this Clinical research associatewriter.

## 2017-06-06 NOTE — Consult Note (Addendum)
Cardiology Consultation:   Patient ID: ABDOULAYE DRUM; [id number]; 1941/01/12   Admit date: 06/05/2017 Date of Consult: 06/06/2017  Primary Care Provider: [name], MD Primary Cardiologist: Dr. [name] Primary Electrophysiologist:  N/A   Patient Profile:   Shawn Lopez is a 76 y.o. male with a hx of CAD who is being seen today for the evaluation of chest pain at the request of Dr. Izola Price.  History of Present Illness:   Shawn Lopez has a history of CAD status post NSTEMI DES to the proximal to mid LAD and diagonal 14/2018, hypertension, PAF in 2006(CHADSVASC 5 but no recurrence and not anticoagulated), HLD, DM, former tobacco abuse, LVEF 55-60% with grade 1 DD on echo 01/14/17.  Patient presents with recurrent chest pain. Troponins negative 3 and EKG without acute change. Other labs stable. Patient awakened at 2 am with dull ache that turned into chest tightness, eased with 1 SL NTG but returned. Tried to go to work but tightness continued. Finally after 12 hrs he came to ER where pain was immediately relieved with NTG paste. This is first episode since stents placed. When he had his MI he had tightness under left arm. No dyspnea, diaphoresis, nausea, indigestion. Has had a lot of palpitations, and not feeling well in general over the past month. Working 12 hrs/day.  Past Medical History:  Diagnosis Date  . Arthritis   . Atrial fibrillation (HCC) 2006   Echocardiogram in 2006-mild LVH with normal EF  . Borderline hypertension   . CAD (coronary artery disease)    a. 12/2016: cath showing 95% mid-LAD stenosis, 75% prox-LAD stenosis and 75% 1st Diag stenosis. DES to the proximal and mid-LAD along with the 1st Diagonal.  . Chest pain 2001   negative stress nuclear in 2006; negative coronary angiography(no documentation); essentially normal PFTs in 07/2004; CT in 2012-lipomatous hypertrophy; mild atherosclerosis of the aorta and coronaries; mild dependent pulmonary  scarring or atelectasis; small area of focal pleural thickening; chronic bronchitic changes on chest x-ray  . Chronic back pain    Prior trauma with vertebral fracture  . Diabetes mellitus   . Diabetes mellitus    CBGs 150-170 in 2011; 2112- treated with single oral agent  . Hyperlipidemia   . Hypertension   . Osteopenia    by bone densitometry  . 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 APPENDECTOMY  1960  . CATARACT EXTRACTION  2012   right eye,lens implantation  . CHOLECYSTECTOMY    . CIRCUMCISION  2010   meatotomy and dilatation  . COLONOSCOPY N/A 01/14/2013   Procedure: COLONOSCOPY;  Surgeon: Dalia Heading, MD;  Location: [facility];  Service: Gastroenterology;  Laterality: N/A;  . CORONARY ANGIOGRAPHY N/A 01/15/2017   Procedure: Coronary Angiography;  Surgeon: Runell Gess, MD;  Location: [facility] INVASIVE CV LAB;  Service: Cardiovascular;  Laterality: N/A;  . CORONARY STENT INTERVENTION N/A 01/15/2017   Procedure: Coronary Stent Intervention;  Surgeon: Runell Gess, MD;  Location: [facility] INVASIVE CV LAB;  Service: Cardiovascular;  Laterality: N/A;     Home Medications:  Prior to Admission medications   Medication Sig Start Date End Date Taking? Authorizing Provider  aspirin EC 81 MG EC tablet Take 1 tablet (81 mg total) by mouth daily. 01/17/17  Yes Ghimire, Werner Lean, MD  atorvastatin (LIPITOR) 80 MG tablet Take 1 tablet (80 mg total) by mouth daily at 6 PM. 03/09/17  Yes Jodelle Gross,  NP  clopidogrel (PLAVIX) 75 MG tablet Take 1 tablet (75 mg total) by mouth daily. 03/09/17  Yes Jodelle Gross, NP  lisinopril (PRINIVIL,ZESTRIL) 5 MG tablet Take 1 tablet (5 mg total) by mouth daily. 03/09/17  Yes Jodelle Gross, NP  metFORMIN (GLUCOPHAGE) 500 MG tablet Take 1,000 mg by mouth 2 (two) times daily with a meal.   Yes [provider]  metoprolol tartrate (LOPRESSOR) 25 MG tablet Take 1 tablet (25 mg  total) by mouth 2 (two) times daily. 03/09/17  Yes Jodelle Gross, NP  nitroGLYCERIN (NITROSTAT) 0.4 MG SL tablet Place 1 tablet (0.4 mg total) under the tongue every 5 (five) minutes as needed for chest pain. For 3 doses only 01/16/17  Yes Ghimire, Werner Lean, MD  LORazepam (ATIVAN) 1 MG tablet Take 0.5 tablets (0.5 mg total) by mouth at bedtime as needed for anxiety. Patient not taking: Reported on 06/05/2017 02/05/17   Raeford Razor, MD    Inpatient Medications: Scheduled Meds: . aspirin EC  81 mg Oral Daily  . atorvastatin  80 mg Oral q1800  . clopidogrel  75 mg Oral Daily  . enoxaparin (LOVENOX) injection  40 mg Subcutaneous Q24H  . insulin aspart  0-9 Units Subcutaneous TID WC  . lisinopril  5 mg Oral Daily  . metoprolol tartrate  25 mg Oral BID   Continuous Infusions:  PRN Meds: LORazepam, nitroGLYCERIN  Allergies:    Allergies  Allergen Reactions  . Brilinta [Ticagrelor]     Dyspnea     Social History:   Social History   Social History  . Marital status: Married    Spouse name: N/A  . Number of children: 6  . Years of education: N/A   Occupational History  . Hospital doctor   Social History Main Topics  . Smoking status: Former Smoker    Packs/day: 1.50    Years: 15.00    Types: Cigarettes    Quit date: 04/20/1997  . Smokeless tobacco: Never Used  . Alcohol use No  . Drug use: No  . Sexual activity: Not on file   Other Topics Concern  . Not on file   Social History Narrative  . No narrative on file    Family History:    Family History  Problem Relation Age of Onset  . CAD Father   . Heart attack Brother      ROS:  Please see the history of present illness.  Review of Systems  Constitution: Positive for malaise/fatigue.  HENT: Negative.   Cardiovascular: Positive for chest pain and palpitations.  Respiratory: Negative.   Endocrine: Negative.   Hematologic/Lymphatic: Negative.   Musculoskeletal: Negative.     Gastrointestinal: Negative.   Genitourinary: Negative.   Neurological: Negative.      All other ROS reviewed and negative.     Physical Exam/Data:   Vitals:   06/05/17 1821 06/05/17 1841 06/05/17 2031 06/06/17 0612  BP: 120/69 124/62 120/61 (!) 120/58  Pulse: (!) 48 (!) 48 (!) 48 (!) 47  Resp: Temp: 97.8 F (36.6 C) 98.2 F (36.8 C) 98.2 F (36.8 C) 98.6 F (37 C)  TempSrc: Oral Oral Oral Oral  SpO2: 95% 97% 96% 95%  Weight:      Height:       No intake or output data in the 24 hours ending 06/06/17 0753 Filed Weights   06/05/17 1428  Weight: 190 lb (86.2 kg)  Body mass index is 27.26 kg/m.  General:  Well nourished, well developed, in no acute distress  HEENT: normal Lymph: no adenopathy Neck: no JVD Endocrine:  No thryomegaly Vascular: No carotid bruits; FA pulses 2+ bilaterally without bruits  Cardiac:  normal S1, S2; RRR; S4, 1-2/6 sys murumur LSB. Lungs:  clear to auscultation bilaterally, no wheezing, rhonchi or rales  Abd: soft, nontender, no hepatomegaly  Ext: no edema Musculoskeletal:  No deformities, BUE and BLE strength normal and equal Skin: warm and dry  Neuro:  CNs 2-12 intact, no focal abnormalities noted Psych:  Normal affect   EKG:  The EKG was personally reviewed and demonstrates:  Sinus bradycardia at 48bpm otherwise normal Telemetry:  Telemetry was personally reviewed and demonstrates: Sinus bradycardia  Relevant CV Studies: Echocardiogram 01/14/2017 Left ventricle: The cavity size was normal. There was mild focal   basal hypertrophy of the septum. Systolic function was normal.   The estimated ejection fraction was in the range of 55% to 60%.   Wall motion was normal; there were no regional wall motion   abnormalities. Doppler parameters are consistent with abnormal   left ventricular relaxation (grade 1 diastolic dysfunction).   Doppler parameters are consistent with high ventricular filling   pressure. - Aortic valve:  There was trivial regurgitation. - Mitral valve: Calcified annulus. Mildly thickened leaflets . - Left atrium: The atrium was moderately dilated. - Atrial septum: There was increased thickness of the septum,   consistent with lipomatous hypertrophy. - Pulmonary arteries: Systolic pressure was mildly increased. PA   peak pressure: 32 mm Hg (S).   Cardiac Cath 01/15/2017 Conclusion       Mid LAD to Dist LAD lesion, 95 %stenosed.  Prox LAD to Mid LAD lesion, 75 %stenosed.  Post intervention, there is a 0% residual stenosis.  A stent was successfully placed.  1st Diag lesion, 75 %stenosed.  Post intervention, there is a 0% residual stenosis.  A stent was successfully placed.        Laboratory Data:  Chemistry Recent Labs Lab 06/05/17 1430  NA 137  K 3.9  CL 104  CO2 25  GLUCOSE 121*  BUN 13  CREATININE 0.87  CALCIUM 9.4  GFRNONAA >60  GFRAA >60  ANIONGAP 8    No results for input(s): PROT, ALBUMIN, AST, ALT, ALKPHOS, BILITOT in the last 168 hours. Hematology Recent Labs Lab 06/05/17 1430  WBC 8.3  RBC 4.75  HGB 14.5  HCT 42.7  MCV 89.9  MCH 30.5  MCHC 34.0  RDW 13.2  PLT 170   Cardiac Enzymes Recent Labs Lab 06/05/17 1430 06/05/17 2034 06/06/17 0227  TROPONINI <0.03 <0.03 <0.03   No results for input(s): TROPIPOC in the last 168 hours.  BNPNo results for input(s): BNP, PROBNP in the last 168 hours.  DDimer No results for input(s): DDIMER in the last 168 hours.  Radiology/Studies:  Dg Chest 2 View  Result Date: 06/05/2017 CLINICAL DATA:  Acute onset of chest pain that awakened the patient at approximately 4 o'clock a.m. this morning. Current history of coronary artery disease with prior stenting. EXAM: CHEST  2 VIEW COMPARISON:  01/13/2017, 12/13/2012 and earlier, including CT chest 03/17/2011. FINDINGS: Suboptimal inspiration accounts for mild bibasilar atelectasis, left greater than right, and accentuates the cardiac silhouette. Taking this  into account, cardiac silhouette moderately enlarged, unchanged. Thoracic aorta tortuous and atherosclerotic, unchanged. LAD coronary stent noted. Hilar and mediastinal contours otherwise unremarkable. Lungs otherwise clear. Pulmonary vascularity normal. Moderate central peribronchial thickening, similar  to the prior examinations. No pleural effusions. Degenerative changes and DISH involving the thoracic spine. Remote fractures involving T12 and L1, unchanged. IMPRESSION: 1. Suboptimal inspiration accounts for mild bibasilar atelectasis. No acute cardiopulmonary disease otherwise. 2. Moderate changes of chronic bronchitis and/or asthma without focal pneumonia. 3. Stable cardiomegaly without pulmonary edema. 4.  Aortic Atherosclerosis (ICD10-170.0) Electronically Signed   By: Hulan Saas M.D.   On: 06/05/2017 14:53    Assessment and Plan:   1. Chest pain at rest concerning for ischemia, NTG responsive. EKG normal, Troponins negative. Hasn't missed any doses of plavix or other meds.Have discussed cath vs myoview. Currently NPO. Discuss with Dr. Purvis Sheffield. 2. CAD status post NSTEMI treated with DES to the proximal and mid LAD and diagonal 1 12/2016 with normal LVEF 3. Hypertension controlled 4. HLD on lipitor 5. PAF x 2 in 2006 no recurrence since and not anticoagulated. Has had a lot of palpitations recently but no AFib documented.    For questions or updates, please contact [facility] Please consult [email or web address] for contact info under Cardiology/STEMI.   Signed, Jacolyn Reedy, PA-C  06/06/2017 7:53 AM   The patient was seen and examined, and I agree with the history, physical exam, assessment and plan as documented above, with modifications as noted below. I have also personally reviewed all relevant documentation, old records, labs, and both radiographic and cardiovascular studies. I have also independently interpreted old and new ECG's.  76 yr old male with CAD and PCI to proximal to  mid LAD, mid to distal LAD, and 1st diagonal (3 separate stents) on 01/15/17 admitted with chest pain. No residual disease in remaining epicardial vessels.  For the past 3-4 weeks, he has felt more fatigued after work than usual. Works 10 hour days but his schedule hasn't changed recently. After coming home, he has had some intermittent chest tightness for the past few weeks. Denies symptoms with exertion. Also denies shortness of breath, leg swelling, and orthopnea.  Yesterday, the pain became so severe he took a nitro for the first time since stents were placed. His pain subsided but then recurred at which time he came to the ED and was given nitro paste. Since then, his symptoms have not returned.  Troponins normal and ECG shows sinus bradycardia. No symptoms/signs of CHF. Renal function and CBC are normal (no infectious signs, no anemia).  He is bradycardic but HR has been 45-50 bpm since PCI in April.  He has taken metoprolol this morning. He has not missed a dose of ASA or Plavix.  Recommendations: While symptoms are concerning for typical angina, I would expect some objective evidence such as ECG abnormalities or even a mild troponin elevation if he has had any stent occlusion.   As he is bradycardic and has already taken metoprolol, I do not think he would achieve target HR with treadmill stress testing.  Will arrange for Lexiscan Myoview this morning.   Prentice Docker, MD, [facility]  06/06/2017 8:51 AM   ADDENDUM:  Eugenie Birks Myoview stress test was normal. As chest pain was nitrate responsive and has been occurring intermittently over the past few weeks, he may have had some degree of coronary vasospasm or perhaps symptoms were related to microvascular ischemia given his diabetes.  I will start Imdur 30 mg daily.   I recommend monitoring him and if he continues to do well, perhaps discharging him later today or tomorrow.  Sinus bradycardia down to 39. Will decrease  metoprolol to 12.5 mg BID. Early f/u  with Dr. Diona BrownerMcDowell.

## 2017-06-06 NOTE — Evaluation (Signed)
Physical Therapy Evaluation Patient Details Name: Shawn NumberWilliam M Gallardo MRN: [id number] DOB: 1941/08/07 Today's Date: 06/06/2017   History of Present Illness  Shawn Lopez is a 76 y.o. male with medical history significant for NSTEMI- with recent PCI with stents 12/2016, DM, HTN, who presented to the Ed with c/o chest pain, slightly left of sternum, that started early this am, and woke him up from sleep at about 5 am this morning. Patient described chest pain as a tightness, 2-3/10. He took his nitro with improvement in pain to a 1/10. Pain is now just a mild ache.. Patient denies pain radiating to his head or arm. No associated SOB, palpitations, diaphoresis, nausea or vomiting. Pt  Reports this pain is not similar to the pain he had in April when he had a heart attack.     Clinical Impression  Patient functioning at base line (see below) and discharged to care of nursing for ambulation daily as tolerated while in hospital    RECOMMENDATION: No PT follow up    Equipment Recommendations  None recommended by PT    Recommendations for Other Services       Precautions / Restrictions Precautions Precautions: None Restrictions Weight Bearing Restrictions: No      Mobility  Bed Mobility Overal bed mobility: Independent                Transfers Overall transfer level: Independent                  Ambulation/Gait Ambulation/Gait assistance: Independent Ambulation Distance (Feet): 150 Feet Assistive device: None Gait Pattern/deviations: WFL(Within Functional Limits)   Gait velocity interpretation: at or above normal speed for age/gender    Stairs Stairs: Yes Stairs assistance: Independent Stair Management: One rail Right Number of Stairs: 18 General stair comments: no loss of balance, normal speed of cadence  Wheelchair Mobility    Modified Rankin (Stroke Patients Only)       Balance Overall balance assessment: Independent                                           Pertinent Vitals/Pain Pain Assessment: No/denies pain    Home Living Family/patient expects to be discharged to:: Private residence Living Arrangements: Alone Available Help at Discharge: Family Type of Home: House Home Access: Stairs to enter Entrance Stairs-Rails: Left Entrance Stairs-Number of Steps: 5 Home Layout: Two level Home Equipment: None      Prior Function Level of Independence: Independent               Hand Dominance        Extremity/Trunk Assessment   Upper Extremity Assessment Upper Extremity Assessment: Overall WFL for tasks assessed    Lower Extremity Assessment Lower Extremity Assessment: Overall WFL for tasks assessed    Cervical / Trunk Assessment Cervical / Trunk Assessment: Normal  Communication   Communication: No difficulties  Cognition Arousal/Alertness: Awake/alert Behavior During Therapy: WFL for tasks assessed/performed Overall Cognitive Status: Within Functional Limits for tasks assessed                                        General Comments      Exercises     Assessment/Plan    PT Assessment Patent does not need any further PT services  PT Problem List         PT Treatment Interventions      PT Goals (Current goals can be found in the Care Plan section)  Acute Rehab PT Goals Patient Stated Goal: Return home today PT Goal Formulation: With patient/family Time For Goal Achievement: 06/06/17 Potential to Achieve Goals: Good    Frequency     Barriers to discharge        Co-evaluation               AM-PAC PT "6 Clicks" Daily Activity  Outcome Measure Difficulty turning over in bed (including adjusting bedclothes, sheets and blankets)?: None Difficulty moving from lying on back to sitting on the side of the bed? : None Difficulty sitting down on and standing up from a chair with arms (e.g., wheelchair, bedside commode, etc,.)?: None Help needed  moving to and from a bed to chair (including a wheelchair)?: None Help needed walking in hospital room?: None Help needed climbing 3-5 steps with a railing? : None 6 Click Score: 24    End of Session   Activity Tolerance: Patient tolerated treatment well Patient left: in bed;with call bell/phone within reach;with family/visitor present Nurse Communication: Mobility status PT Visit Diagnosis: Other abnormalities of gait and mobility (R26.89);Muscle weakness (generalized) (M62.81)    Time: 5409-8119 PT Time Calculation (min) (ACUTE ONLY): 30 min   Charges:   PT Evaluation $PT Eval Low Complexity: 1 Low PT Treatments $Gait Training: 23-37 mins   PT G Codes:   PT G-Codes **NOT FOR INPATIENT CLASS** Functional Assessment Tool Used: AM-PAC 6 Clicks Basic Mobility Functional Limitation: Mobility: Walking and moving around Mobility: Walking and Moving Around Current Status (J4782): 0 percent impaired, limited or restricted Mobility: Walking and Moving Around Goal Status (N5621): 0 percent impaired, limited or restricted Mobility: Walking and Moving Around Discharge Status (H0865): 0 percent impaired, limited or restricted      11:39 AM, 06/06/17 Ocie Bob, MPT Physical Therapist with [facility] [phone] office [phone] mobile phone

## 2017-06-06 NOTE — Discharge Summary (Signed)
Physician Discharge Summary  Shawn Lopez JYN:[id number] DOB: 01/26/1941 DOA: 06/05/2017  PCP: [name], MD  Admit date: 06/05/2017 Discharge date: 06/06/2017  Recommendations for Outpatient Follow-up:  1. Pt will need to follow up with PCP in 1 week post discharge 2. Please Note that dose of metoprolol was lowered from 25 mg down to 12.5 mg tablet due to bradycardia 3. Patient was advised that heart rate needs to be monitored closely as further reduction in dose of metoprolol may be needed and possibly even discontinuing beta blocker if persistent bradycardia 4. Please note the cardiologist has started patient on Imdur 30 mg by mouth daily, patient was educated on the medication   Discharge Diagnoses:  Principal Problem:   Chest pain Active Problems:   Atrial fibrillation (HCC)   Essential hypertension   Hyperlipidemia LDL goal <70   Diabetes mellitus (HCC)   CAD (coronary artery disease) with PCI 12/2016  Discharge Condition: Stable  Diet recommendation: Heart healthy diet discussed in details   History of present illness:  Patient is 76 year old male with known history of NSTEMI with recent PCI and stents in April 2018, diabetes, hypertension, presents to emergency department with sudden onset of left-sided chest pain that woke him up from sleep. Patient describes pain as tightness and has taken nitroglycerin as was previously recommended. Patient reported improvement with nitroglycerin and presented to emergency department for further evaluation. In emergency department, it was noted that patient's heart rate was 48 50 bpm, systolic blood pressure in 140s, chest x-ray with moderate changes of chronic bronchitis otherwise unremarkable. Patient has gotten dose of aspirin 324 mg by mouth in the emergency department.   Hospital Course:  Principal Problem:   Chest pain - Per cardiology, suspected underlying spasms in the setting of bradycardia - Stress test done, no  acute findings - Per cardiology, dose of metoprolol lowered from 25 mg to 12.5 mg - Cardiology also started Imdur 30 mg by mouth daily - Patient advised on medical compliance and outpatient follow-up  Active Problems:   Atrial fibrillation (HCC), paroxysmal - Please note the dose change in metoprolol outlined above - Patient had episode in 2006, no recurrence since and therefore not anticoagulated    Essential hypertension - Resume home medical regimen    Hyperlipidemia LDL goal <70 - Continue statin    Diabetes mellitus (HCC) - Resume home medical regimen    CAD (coronary artery disease) with PCI 12/2016   Procedures/Studies: Dg Chest 2 View  Result Date: 06/05/2017 CLINICAL DATA:  Acute onset of chest pain that awakened the patient at approximately 4 o'clock a.m. this morning. Current history of coronary artery disease with prior stenting. EXAM: CHEST  2 VIEW COMPARISON:  01/13/2017, 12/13/2012 and earlier, including CT chest 03/17/2011. FINDINGS: Suboptimal inspiration accounts for mild bibasilar atelectasis, left greater than right, and accentuates the cardiac silhouette. Taking this into account, cardiac silhouette moderately enlarged, unchanged. Thoracic aorta tortuous and atherosclerotic, unchanged. LAD coronary stent noted. Hilar and mediastinal contours otherwise unremarkable. Lungs otherwise clear. Pulmonary vascularity normal. Moderate central peribronchial thickening, similar to the prior examinations. No pleural effusions. Degenerative changes and DISH involving the thoracic spine. Remote fractures involving T12 and L1, unchanged. IMPRESSION: 1. Suboptimal inspiration accounts for mild bibasilar atelectasis. No acute cardiopulmonary disease otherwise. 2. Moderate changes of chronic bronchitis and/or asthma without focal pneumonia. 3. Stable cardiomegaly without pulmonary edema. 4.  Aortic Atherosclerosis (ICD10-170.0) Electronically Signed   By: Hulan Saas M.D.   On:  06/05/2017 14:53  Nm Myocar Multi W/spect W/wall Motion / Ef  Result Date: 06/06/2017  There was no ST segment deviation noted during stress.  The study is normal. No myocardial ischemia or scar.  This is a low risk study.  Nuclear stress EF: 56%.      Discharge Exam: Vitals:   06/05/17 2031 06/06/17 0612  BP: 120/61 (!) 120/58  Pulse: (!) 48 (!) 47  Resp: 18 18  Temp: 98.2 F (36.8 C) 98.6 F (37 C)  SpO2: 96% 95%   Vitals:   06/05/17 1821 06/05/17 1841 06/05/17 2031 06/06/17 0612  BP: 120/69 124/62 120/61 (!) 120/58  Pulse: (!) 48 (!) 48 (!) 48 (!) 47  Resp: Temp: 97.8 F (36.6 C) 98.2 F (36.8 C) 98.2 F (36.8 C) 98.6 F (37 C)  TempSrc: Oral Oral Oral Oral  SpO2: 95% 97% 96% 95%  Weight:      Height:        General: Pt is alert, follows commands appropriately, not in acute distress Cardiovascular: Regular rate and rhythm, Bradycardic, no rubs, no gallops Respiratory: Clear to auscultation bilaterally, no wheezing, no crackles, no rhonchi Abdominal: Soft, non tender, non distended, bowel sounds +, no guarding Extremities: no edema, no cyanosis, pulses palpable bilaterally DP and PT Neuro: Grossly nonfocal  Discharge Instructions   Allergies as of 06/06/2017      Reactions   Brilinta [ticagrelor]    Dyspnea       Medication List    TAKE these medications   aspirin 81 MG EC tablet Take 1 tablet (81 mg total) by mouth daily.   atorvastatin 80 MG tablet Commonly known as:  LIPITOR Take 1 tablet (80 mg total) by mouth daily at 6 PM.   clopidogrel 75 MG tablet Commonly known as:  PLAVIX Take 1 tablet (75 mg total) by mouth daily.   isosorbide mononitrate 30 MG 24 hr tablet Commonly known as:  IMDUR Take 1 tablet (30 mg total) by mouth daily.   lisinopril 5 MG tablet Commonly known as:  PRINIVIL,ZESTRIL Take 1 tablet (5 mg total) by mouth daily.   LORazepam 1 MG tablet Commonly known as:  ATIVAN Take 0.5 tablets (0.5 mg total) by  mouth at bedtime as needed for anxiety.   metFORMIN 500 MG tablet Commonly known as:  GLUCOPHAGE Take 1,000 mg by mouth 2 (two) times daily with a meal.   metoprolol tartrate 25 MG tablet Commonly known as:  LOPRESSOR Take 0.5 tablets (12.5 mg total) by mouth 2 (two) times daily. What changed:  how much to take   nitroGLYCERIN 0.4 MG SL tablet Commonly known as:  NITROSTAT Place 1 tablet (0.4 mg total) under the tongue every 5 (five) minutes as needed for chest pain. For 3 doses only            Discharge Care Instructions        Start     Ordered   06/06/17 0000  isosorbide mononitrate (IMDUR) 30 MG 24 hr tablet  Daily     06/06/17 1315   06/06/17 0000  metoprolol tartrate (LOPRESSOR) 25 MG tablet  2 times daily     06/06/17 1315     Follow-up Information    [name], MD Follow up.   Specialty:  Family Medicine Contact information: [address] [place] [address] [phone]            The results of significant diagnostics from this hospitalization (including imaging, microbiology, ancillary  and laboratory) are listed below for reference.     Microbiology: No results found for this or any previous visit (from the past 240 hour(s)).   Labs: Basic Metabolic Panel:  Recent Labs Lab 06/05/17 1430  NA 137  K 3.9  CL 104  CO2 25  GLUCOSE 121*  BUN 13  CREATININE 0.87  CALCIUM 9.4   CBC:  Recent Labs Lab 06/05/17 1430  WBC 8.3  HGB 14.5  HCT 42.7  MCV 89.9  PLT 170   Cardiac Enzymes:  Recent Labs Lab 06/05/17 1430 06/05/17 2034 06/06/17 0227  TROPONINI <0.03 <0.03 <0.03   CBG:  Recent Labs Lab 06/06/17 0801 06/06/17 1109  GLUCAP 157* 171*     SIGNED: Time coordinating discharge: 60 minutes  Debbora PrestoIskra Magick-Henderson Frampton, MD  [facility] 06/06/2017, 1:15 PM Pager [phone]  If 7PM-7AM, please contact night-coverage [email or web address] Password [email or web address]

## 2017-07-17 ENCOUNTER — Telehealth: Payer: Self-pay

## 2017-07-17 NOTE — Telephone Encounter (Signed)
Patient walked into office and stated he wanted apt today because he believed his medications have been mixed up since hospital discharge.He says he stopped taking lopressor because his heart rate "goes up and down on it ". He reports normal blood pressures. I told him he needed to go to the ED if he felt bad, but he declined,he stated "they are not heart doctors"  declined apt for tomorrow, states he has to work.He did accept an apt on Friday 07/20/17 with D.Shea Evansunn, PA-C

## 2017-07-19 ENCOUNTER — Encounter: Payer: Self-pay | Admitting: Physician Assistant

## 2017-07-19 NOTE — Progress Notes (Deleted)
Cardiology Office Note    Date:  07/19/2017  ID:  Shawn Lopez, DOB [birth year], MRN [id number] PCP:  Gareth MorganKnowlton, [name], MD  Cardiologist: Diona BrownerMcDowell (do not see that he has actually seen him before, but APP notes after hospital stay 01/2017 indicate the patient wished to follow up with him - but has seen APPs in the interim)  Chief Complaint: discuss medications  History of Present Illness:  Shawn Lopez is a 76 y.o. male with history of CAD (NSTEMI 12/2016 s/p DES to prox/mLAD and D1, paroxysmal atrial fibrillation, chronic back pain, DM, HTN, HLD, osteopenia, former tobacco abuse who presents to discuss medication management.  Last known episode of atrial fib was in 2006 per notes. Last cath was in 12/2016 as above. He did not tolerate Brilinta due to dyspnea but did better with Plavix. 2D echo at that time showed EF 55-60%, no RWMA, grade 1 DD, moderate LAE, PASP 32mmHg. He was admitted 05/2017 with left sided chest pain that awoke him from sleep, improved with NTG. Troponins were negative. While symptoms were concerning for angina, cardiology temp felt they would expect some objective evidence such as ECG abnormality or even mild troponin bump, none of which were evident. Lexiscan was performed which was negative. Given that his CP was NTG-responsive, the question of spasm was raised. Imdur 30mg  daily was added. 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 Labs otherwise showed normal TSH, CBC, Cr 0.87 during that admission. Last LDL 55 in 12/2016.  Palp? If + event monitor   Chest pain CAD Sinus bradycardia Paroxysmal atrial fib     Past Medical History:  Diagnosis Date  . Arthritis   . Atrial fibrillation (HCC) 2006   a. in 2006; not on anticoagulation, no recent atrial fib seen as of 2018.  . Borderline hypertension   . CAD (coronary artery disease)    a. NSTEMI 12/2016: cath showing 95%  mid-LAD stenosis, 75% prox-LAD stenosis and 75% 1st Diag stenosis. DES to the proximal and mid-LAD along with the 1st Diagonal..  . Chest pain 2001   negative stress nuclear in 2006; negative coronary angiography(no documentation); essentially normal PFTs in 07/2004; CT in 2012-lipomatous hypertrophy; mild atherosclerosis of the aorta and coronaries; mild dependent pulmonary scarring or atelectasis; small area of focal pleural thickening; chronic bronchitic changes on chest x-ray  . Chronic back pain    Prior trauma with vertebral fracture  . Diabetes mellitus   . Hyperlipidemia   . Hypertension   . Osteopenia    by bone densitometry  . Sinus bradycardia    a. metoprolol decreased 05/2017 due to this.  . 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 APPENDECTOMY  1960  . CATARACT EXTRACTION  2012   right eye,lens implantation  . CHOLECYSTECTOMY    . CIRCUMCISION  2010   meatotomy and dilatation  . COLONOSCOPY N/A 01/14/2013   Procedure: COLONOSCOPY;  Surgeon: Dalia HeadingMark A Jenkins, MD;  [facility];  Service: Gastroenterology;  Laterality: N/A;  . CORONARY ANGIOGRAPHY N/A 01/15/2017   Procedure: Coronary Angiography;  Surgeon: Runell GessJonathan J Berry, MD;  [facility];  Service: Cardiovascular;  Laterality: N/A;  . CORONARY STENT INTERVENTION N/A 01/15/2017   Procedure: Coronary Stent Intervention;  Surgeon: Runell GessJonathan J Berry, MD;  Location: [facility] CV LAB;  Service: Cardiovascular;  Laterality: N/A;  Current Medications: No outpatient prescriptions have been marked as taking for the 07/20/17 encounter (Appointment) with Laurann Montana, PA-C.     Allergies:   Brilinta [ticagrelor]   Social History   Social History  . Marital status: Married    Spouse name: N/A  . Number of children: 6  . Years of education: N/A   Occupational History  . Hospital doctor   Social History Main  Topics  . Smoking status: Former Smoker    Packs/day: 1.50    Years: 15.00    Types: Cigarettes    Quit date: 04/20/1997  . Smokeless tobacco: Never Used  . Alcohol use No  . Drug use: No  . Sexual activity: Not on file   Other Topics Concern  . Not on file   Social History Narrative  . No narrative on file     Family History:  Family History  Problem Relation Age of Onset  . CAD Father   . Heart attack Brother    ***  ROS:   Please see the history of present illness. Otherwise, review of systems is positive for ***.  All other systems are reviewed and otherwise negative.    PHYSICAL EXAM:   VS:  There were no vitals taken for this visit.  BMI: There is no height or weight on file to calculate BMI. GEN: Well nourished, well developed, in no acute distress  HEENT: normocephalic, atraumatic Neck: no JVD, carotid bruits, or masses Cardiac: ***RRR; no murmurs, rubs, or gallops, no edema  Respiratory:  clear to auscultation bilaterally, normal work of breathing GI: soft, nontender, nondistended, + BS MS: no deformity or atrophy  Skin: warm and dry, no rash Neuro:  Alert and Oriented x 3, Strength and sensation are intact, follows commands Psych: euthymic mood, full affect  Wt Readings from Last 3 Encounters:  06/05/17 190 lb (86.2 kg)  03/09/17 190 lb (86.2 kg)  02/06/17 187 lb (84.8 kg)      Studies/Labs Reviewed:   EKG:  EKG was ordered today and personally reviewed by me and demonstrates *** EKG was not ordered today.***  Recent Labs: 01/15/2017: Magnesium 2.2 06/05/2017: BUN 13; Creatinine, Ser 0.87; Hemoglobin 14.5; Platelets 170; Potassium 3.9; Sodium 137; TSH 2.751   Lipid Panel    Component Value Date/Time   CHOL 123 01/15/2017 0702   TRIG 158 (H) 01/15/2017 0702   HDL 36 (L) 01/15/2017 0702   CHOLHDL 3.4 01/15/2017 0702   VLDL 32 01/15/2017 0702   LDLCALC 55 01/15/2017 0702    Additional studies/ records that were reviewed today  include: Summarized above.***    ASSESSMENT & PLAN:   1. ***  Disposition: F/u with ***   Medication Adjustments/Labs and Tests Ordered: Current medicines are reviewed at length with the patient today.  Concerns regarding medicines are outlined above. Medication changes, Labs and Tests ordered today are summarized above and listed in the Patient Instructions accessible in Encounters.   Signed, Laurann Montana, PA-C  07/19/2017 12:39 PM    [facility] [place] Location in [facility] [address] [place], [place] [address] Ph: [phone]; Fax [phone]

## 2017-07-20 ENCOUNTER — Ambulatory Visit: Payer: Medicare HMO | Admitting: Physician Assistant

## 2017-09-06 MED ORDER — SODIUM CHLORIDE 0.9% FLUSH
INTRAVENOUS | Status: AC
  Filled 2017-09-06: qty 300

## 2017-09-13 ENCOUNTER — Ambulatory Visit: Payer: Medicare HMO | Admitting: Adult Health

## 2017-09-13 ENCOUNTER — Encounter: Payer: Self-pay | Admitting: Adult Health

## 2017-09-13 VITALS — BP 134/66 | HR 47 | Ht 71.0 in | Wt 191.0 lb

## 2017-09-13 DIAGNOSIS — I480 Paroxysmal atrial fibrillation: Secondary | ICD-10-CM | POA: Diagnosis not present

## 2017-09-13 DIAGNOSIS — I10 Essential (primary) hypertension: Secondary | ICD-10-CM | POA: Diagnosis not present

## 2017-09-13 DIAGNOSIS — I2510 Atherosclerotic heart disease of native coronary artery without angina pectoris: Secondary | ICD-10-CM | POA: Diagnosis not present

## 2017-09-13 NOTE — Patient Instructions (Signed)

## 2017-09-13 NOTE — Progress Notes (Signed)
Cardiology Office Note   Date:  09/13/2017   ID:  Shawn Lopez, DOB [birth year], MRN [id number]  PCP:  Gareth MorganKnowlton, [name], MD  Cardiologist:  Jonelle SidleSamuel G. McDowell, MD Chief Complaint  Patient presents with  . Coronary Artery Disease  . Hypertension  . Atrial Fibrillation     History of Present Illness: Shawn Lopez is a 76 y.o. male who presents for posthospitalization follow-up where she was seen on consultation for chest pain.  Has a history of non-ST elevation MI, CAD with drug-eluting stent to the proximal and mid LAD, and diagonal, 09/28/2016, hypertension, hyperlipidemia, accessible atrial fibrillation, not on anticoagulation therapy,CHADS VASC Score of 5.  A result of her symptoms the patient was scheduled for a [employer] Myoview, completed on 06/06/2017.   There was no ST segment deviation noted during stress.  The study is normal. No myocardial ischemia or scar.  This is a low risk study.  Nuclear stress EF: 56%.  He was also noted to be bradycardic and metoprolol was decreased to 12.5 mg daily.  Also, started on Imdur  30 mg daily.  A phone call was made on 07/17/2017 in which the patient stated that he thought his medications were mixed up.  He stopped taking metoprolol because his heart rate was "going up and down" he was scheduled for a follow-up appointment on 07/20/2017, but did not appear for that appointment.  He comes today without any new complaints.  He occasionally has chest discomfort rarely he has had to take nitroglycerin for times since July.  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  He enjoys being outdoors and his energy level the same.  He is medically compliant.   Past Medical History:  Diagnosis Date  . Arthritis   . Atrial fibrillation (HCC) 2006   a. in 2006; not on anticoagulation, no recent atrial fib seen as of 2018.  . Borderline hypertension   . CAD (coronary artery disease)    a. NSTEMI 12/2016: cath showing  95% mid-LAD stenosis, 75% prox-LAD stenosis and 75% 1st Diag stenosis. DES to the proximal and mid-LAD along with the 1st Diagonal..  . Chest pain 2001   negative stress nuclear in 2006; negative coronary angiography(no documentation); essentially normal PFTs in 07/2004; CT in 2012-lipomatous hypertrophy; mild atherosclerosis of the aorta and coronaries; mild dependent pulmonary scarring or atelectasis; small area of focal pleural thickening; chronic bronchitic changes on chest x-ray  . Chronic back pain    Prior trauma with vertebral fracture  . Diabetes mellitus   . Hyperlipidemia   . Hypertension   . Osteopenia    by bone densitometry  . Sinus bradycardia    a. metoprolol decreased 05/2017 due to this.  . 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 APPENDECTOMY  1960  . CATARACT EXTRACTION  2012   right eye,lens implantation  . CHOLECYSTECTOMY    . CIRCUMCISION  2010   meatotomy and dilatation  . COLONOSCOPY N/A 01/14/2013   Procedure: COLONOSCOPY;  Surgeon: Dalia HeadingMark A Jenkins, MD;  [facility];  Service: Gastroenterology;  Laterality: N/A;  . CORONARY ANGIOGRAPHY N/A 01/15/2017   Procedure: Coronary Angiography;  Surgeon: Runell GessJonathan J Berry, MD;  [facility];  Service: Cardiovascular;  Laterality: N/A;  . CORONARY STENT INTERVENTION N/A 01/15/2017   Procedure: Coronary Stent Intervention;  Surgeon: Runell GessJonathan J Berry, MD;  [facility]: [facility];  Service: Cardiovascular;  Laterality:  N/A;     Current Outpatient Medications  Medication Sig Dispense Refill  . aspirin EC 81 MG EC tablet Take 1 tablet (81 mg total) by mouth daily. 30 tablet 0  . atorvastatin (LIPITOR) 80 MG tablet Take 1 tablet (80 mg total) by mouth daily at 6 PM. 90 tablet 3  . clopidogrel (PLAVIX) 75 MG tablet Take 1 tablet (75 mg total) by mouth daily. 90 tablet 3  . lisinopril (PRINIVIL,ZESTRIL) 5 MG tablet Take 1 tablet (5 mg total)  by mouth daily. 90 tablet 3  . LORazepam (ATIVAN) 1 MG tablet Take 0.5 tablets (0.5 mg total) by mouth at bedtime as needed for anxiety. 7 tablet 0  . metFORMIN (GLUCOPHAGE) 500 MG tablet Take 1,000 mg by mouth 2 (two) times daily with a meal.    . metoprolol tartrate (LOPRESSOR) 25 MG tablet Take 25 mg by mouth 2 (two) times daily.    . nitroGLYCERIN (NITROSTAT) 0.4 MG SL tablet Place 1 tablet (0.4 mg total) under the tongue every 5 (five) minutes as needed for chest pain. For 3 doses only 30 tablet 0   No current facility-administered medications for this visit.     Allergies:   Brilinta [ticagrelor]    Social History:  The patient  reports that he quit smoking about 20 years ago. His smoking use included cigarettes. He has a 22.50 pack-year smoking history. he has never used smokeless tobacco. He reports that he does not drink alcohol or use drugs.   Family History:  The patient's family history includes CAD in his father; Heart attack in his brother.    ROS: All other systems are reviewed and negative. Unless otherwise mentioned in H&P    PHYSICAL EXAM: VS:  BP 134/66   Pulse (!) 47   Ht 5\' 11"  (1.803 m)   Wt 191 lb (86.6 kg)   SpO2 97%   BMI 26.64 kg/m  , BMI Body mass index is 26.64 kg/m. GEN: Well nourished, well developed, in no acute distress  HEENT: normal  Neck: no JVD, carotid bruits, or masses Cardiac: RRR; no murmurs, rubs, or gallops,no edema  Respiratory:  clear to auscultation bilaterally, normal work of breathing GI: soft, nontender, nondistended, + BS MS: no deformity or atrophy  Skin: warm and dry, no rash Neuro:  Strength and sensation are intact Psych: euthymic mood, full affect  Recent Labs: 01/15/2017: Magnesium 2.2 06/05/2017: BUN 13; Creatinine, Ser 0.87; Hemoglobin 14.5; Platelets 170; Potassium 3.9; Sodium 137; TSH 2.751    Lipid Panel    Component Value Date/Time   CHOL 123 01/15/2017 0702   TRIG 158 (H) 01/15/2017 0702   HDL 36 (L)  01/15/2017 0702   CHOLHDL 3.4 01/15/2017 0702   VLDL 32 01/15/2017 0702   LDLCALC 55 01/15/2017 0702      Wt Readings from Last 3 Encounters:  09/13/17 191 lb (86.6 kg)  06/05/17 190 lb (86.2 kg)  03/09/17 190 lb (86.2 kg)      Other studies Reviewed: Echocardiogram 2017/01/26 Left ventricle: The cavity size was normal. There was mild focal   basal hypertrophy of the septum. Systolic function was normal.   The estimated ejection fraction was in the range of 55% to 60%.   Wall motion was normal; there were no regional wall motion   abnormalities. Doppler parameters are consistent with abnormal   left ventricular relaxation (grade 1 diastolic dysfunction).   Doppler parameters are consistent with high ventricular filling   pressure. - Aortic valve: There  was trivial regurgitation. - Mitral valve: Calcified annulus. Mildly thickened leaflets . - Left atrium: The atrium was moderately dilated. - Atrial septum: There was increased thickness of the septum,   consistent with lipomatous hypertrophy. - Pulmonary arteries: Systolic pressure was mildly increased. PA   peak pressure: 32 mm Hg (S).   ASSESSMENT AND PLAN:  1.  Coronary artery disease: Known history of drug-eluting stent to the proximal and mid LAD along with diagonal.  The patient is intolerant of Brilinta but is intolerant of Plavix.  He is medically compliant and offers no significant complaints of chest pain.  He has rare occasions when he does have discomfort, he states usually when he is been sitting around all day and.  One nitroglycerin will help take care of it.  He is only had to take it 4 times since July 2018.  2.  Bradycardia: Patient is tolerating metoprolol 25 mg twice daily.  He did go down to her lower dose but his heart rate continued to race.  He prefers not to change his dose on medication at this time.  He is not having any dizziness or somnolence.  No changes in dosing.  3.  Hypertension: Blood pressures  currently controlled.  No changes in regimen.  Current medicines are reviewed at length with the patient today.    Labs/ tests ordered today include: Labs are managed by primary care physician, [name].  Bettey MareKathryn M. Liborio NixonLawrence DNP, ANP, AACC   09/13/2017 1:26 PM    [facility] [address], [place], [place] [address] Phone: [phone]; Fax: [phone]

## 2017-09-25 ENCOUNTER — Emergency Department (HOSPITAL_COMMUNITY)
Admission: EM | Admit: 2017-09-25 | Discharge: 2017-09-25 | Disposition: A | Discharge status: Home / Self Care | Payer: Medicare HMO | Attending: Emergency Medicine | Admitting: Emergency Medicine

## 2017-09-25 ENCOUNTER — Emergency Department (HOSPITAL_COMMUNITY): Payer: Medicare HMO

## 2017-09-25 ENCOUNTER — Other Ambulatory Visit: Payer: Self-pay

## 2017-09-25 ENCOUNTER — Encounter (HOSPITAL_COMMUNITY): Payer: Self-pay | Admitting: Emergency Medicine

## 2017-09-25 DIAGNOSIS — I2510 Atherosclerotic heart disease of native coronary artery without angina pectoris: Secondary | ICD-10-CM | POA: Insufficient documentation

## 2017-09-25 DIAGNOSIS — R079 Chest pain, unspecified: Secondary | ICD-10-CM | POA: Diagnosis not present

## 2017-09-25 DIAGNOSIS — E119 Type 2 diabetes mellitus without complications: Secondary | ICD-10-CM | POA: Diagnosis not present

## 2017-09-25 DIAGNOSIS — Z7984 Long term (current) use of oral hypoglycemic drugs: Secondary | ICD-10-CM | POA: Diagnosis not present

## 2017-09-25 DIAGNOSIS — I252 Old myocardial infarction: Secondary | ICD-10-CM | POA: Diagnosis not present

## 2017-09-25 DIAGNOSIS — I10 Essential (primary) hypertension: Secondary | ICD-10-CM | POA: Insufficient documentation

## 2017-09-25 DIAGNOSIS — Z7902 Long term (current) use of antithrombotics/antiplatelets: Secondary | ICD-10-CM | POA: Insufficient documentation

## 2017-09-25 DIAGNOSIS — Z79899 Other long term (current) drug therapy: Secondary | ICD-10-CM | POA: Insufficient documentation

## 2017-09-25 DIAGNOSIS — Z7982 Long term (current) use of aspirin: Secondary | ICD-10-CM | POA: Insufficient documentation

## 2017-09-25 LAB — CBC WITH DIFFERENTIAL/PLATELET
BASOS ABS: 0 10*3/uL (ref 0.0–0.1)
Basophils Relative: 0 %
EOS PCT: 5 %
Eosinophils Absolute: 0.4 10*3/uL (ref 0.0–0.7)
HEMATOCRIT: 42.5 % (ref 39.0–52.0)
HEMOGLOBIN: 13.5 g/dL (ref 13.0–17.0)
LYMPHS ABS: 2 10*3/uL (ref 0.7–4.0)
LYMPHS PCT: 27 %
MCH: 29.6 pg (ref 26.0–34.0)
MCHC: 31.8 g/dL (ref 30.0–36.0)
MCV: 93.2 fL (ref 78.0–100.0)
Monocytes Absolute: 0.7 10*3/uL (ref 0.1–1.0)
Monocytes Relative: 9 %
NEUTROS ABS: 4.4 10*3/uL (ref 1.7–7.7)
Neutrophils Relative %: 59 %
Platelets: 196 10*3/uL (ref 150–400)
RBC: 4.56 MIL/uL (ref 4.22–5.81)
RDW: 13.5 % (ref 11.5–15.5)
WBC: 7.4 10*3/uL (ref 4.0–10.5)

## 2017-09-25 LAB — BASIC METABOLIC PANEL
ANION GAP: 10 (ref 5–15)
BUN: 14 mg/dL (ref 6–20)
CALCIUM: 9.2 mg/dL (ref 8.9–10.3)
CO2: 25 mmol/L (ref 22–32)
Chloride: 101 mmol/L (ref 101–111)
Creatinine, Ser: 0.78 mg/dL (ref 0.61–1.24)
Glucose, Bld: 108 mg/dL — ABNORMAL HIGH (ref 65–99)
Potassium: 4.1 mmol/L (ref 3.5–5.1)
SODIUM: 136 mmol/L (ref 135–145)

## 2017-09-25 LAB — TROPONIN I

## 2017-09-25 NOTE — ED Triage Notes (Signed)
PT c/o left sided chest pain with numbness to left arm numbness intermittently x2 days. PT states pain was relieved last night after one nitro tablet.

## 2017-09-25 NOTE — Discharge Instructions (Signed)
Tests showed no life-threatening condition.  Follow-up with your cardiologist or return if worse.  Recommend creating a blood pressure log at home.  Record your blood pressure and what medications you have taken that day.

## 2017-09-25 NOTE — ED Provider Notes (Signed)
[facility] EMERGENCY DEPARTMENT Provider Note   CSN: [id number] Arrival date & time: 09/25/17  1305     History   Chief Complaint Chief Complaint  Patient presents with  . Chest Pain    HPI Shawn Lopez is a 77 y.o. male.  Patient complains of discomfort on the medial aspect of his left humerus with intermittent chest pain since 8:30 PM last night.  Patient states he had an MI in March 2018.  He has 4 stents.  Cardiac risk factors include diabetes, hypertension, hyperlipidemia, atrial fibrillation (not currently).  No dyspnea, diaphoresis, nausea.  No current chest pain.  He is normally bradycardic.  Severity of symptoms is mild.  Nothing makes symptoms better or worse.      Past Medical History:  Diagnosis Date  . Arthritis   . Atrial fibrillation (HCC) 2006   a. in 2006; not on anticoagulation, no recent atrial fib seen as of 2018.  . Borderline hypertension   . CAD (coronary artery disease)    a. NSTEMI 12/2016: cath showing 95% mid-LAD stenosis, 75% prox-LAD stenosis and 75% 1st Diag stenosis. DES to the proximal and mid-LAD along with the 1st Diagonal..  . Chest pain 2001   negative stress nuclear in 2006; negative coronary angiography(no documentation); essentially normal PFTs in 07/2004; CT in 2012-lipomatous hypertrophy; mild atherosclerosis of the aorta and coronaries; mild dependent pulmonary scarring or atelectasis; small area of focal pleural thickening; chronic bronchitic changes on chest x-ray  . Chronic back pain    Prior trauma with vertebral fracture  . Diabetes mellitus   . Hyperlipidemia   . Hypertension   . Osteopenia    by bone densitometry  . Sinus bradycardia    a. metoprolol decreased 05/2017 due to this.  . 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 CAD (coronary artery disease) with PCI 12/2016 06/05/2017  . Chest pain 01/13/2017  . NSTEMI (non-ST elevated myocardial infarction) (HCC)  01/13/2017  . Pain in joint, shoulder region 10/20/2013  . Muscle spasm of right shoulder 10/20/2013  . Right rotator cuff tear 10/14/2013  . 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 Atrial fibrillation (HCC)   . Essential hypertension   . Hyperlipidemia LDL goal <70   . Diabetes mellitus (HCC)   . Osteopenia   . Chronic back pain   . Chest pain     Past Surgical History:  Procedure Laterality Date  . APPENDECTOMY  1960  . CATARACT EXTRACTION  2012   right eye,lens implantation  . CHOLECYSTECTOMY    . CIRCUMCISION  2010   meatotomy and dilatation  . COLONOSCOPY N/A 01/14/2013   Procedure: COLONOSCOPY;  Surgeon: Dalia Heading, MD;  Location: [facility] ENDO [facility];  Service: Gastroenterology;  Laterality: N/A;  . CORONARY ANGIOGRAPHY N/A 01/15/2017   Procedure: Coronary Angiography;  Surgeon: Runell Gess, MD;  Location: [facility] INVASIVE CV LAB;  Service: Cardiovascular;  Laterality: N/A;  . CORONARY STENT INTERVENTION N/A 01/15/2017   Procedure: Coronary Stent Intervention;  Surgeon: Runell Gess, MD;  Location: [facility] INVASIVE CV LAB;  Service: Cardiovascular;  Laterality: N/A;       Home Medications    Prior to Admission medications   Medication Sig Start Date End Date Taking? Authorizing Provider  aspirin EC 81 MG EC tablet Take 1 tablet (81 mg total) by mouth daily. 01/17/17  Yes Ghimire, Werner Lean, MD  atorvastatin (LIPITOR) 80 MG tablet Take 1 tablet (  80 mg total) by mouth daily at 6 PM. 03/09/17  Yes Jodelle Gross, NP  clopidogrel (PLAVIX) 75 MG tablet Take 1 tablet (75 mg total) by mouth daily. 03/09/17  Yes Jodelle Gross, NP  HYDROcodone-acetaminophen (NORCO) 10-325 MG tablet Take 1 tablet by mouth 4 (four) times daily as needed. 08/02/17  Yes [provider]  lisinopril (PRINIVIL,ZESTRIL) 5 MG tablet Take 1 tablet (5 mg total) by mouth daily. 03/09/17  Yes Jodelle Gross, NP  metFORMIN (GLUCOPHAGE-XR) 500 MG 24 hr tablet Take 2 tablets by mouth 2 (two)  times daily. 07/30/17  Yes [provider]  metoprolol tartrate (LOPRESSOR) 25 MG tablet Take 25 mg by mouth 2 (two) times daily.   Yes [provider]  nitroGLYCERIN (NITROSTAT) 0.4 MG SL tablet Place 1 tablet (0.4 mg total) under the tongue every 5 (five) minutes as needed for chest pain. For 3 doses only 01/16/17  Yes Ghimire, Werner Lean, MD    Family History Family History  Problem Relation Age of Onset  . CAD Father   . Heart attack Brother     Social History Social History   Tobacco Use  . Smoking status: Former Smoker    Packs/day: 1.50    Years: 15.00    Pack years: 22.50    Types: Cigarettes    Last attempt to quit: 04/20/1997    Years since quitting: 20.4  . Smokeless tobacco: Never Used  Substance Use Topics  . Alcohol use: No  . Drug use: No     Allergies   Brilinta [ticagrelor]   Review of Systems Review of Systems  All other systems reviewed and are negative.    Physical Exam Updated Vital Signs BP (!) 140/96   Pulse (!) 44   Temp 97.8 F (36.6 C) (Oral)   Resp 16   Ht 5\' 11"  (1.803 m)   Wt 86.2 kg (190 lb)   SpO2 96%   BMI 26.50 kg/m   Physical Exam  Constitutional: He is oriented to person, place, and time. He appears well-developed and well-nourished.  HENT:  Head: Normocephalic and atraumatic.  Eyes: Conjunctivae are normal.  Neck: Neck supple.  Cardiovascular: Regular rhythm.  bradycardic  Pulmonary/Chest: Effort normal and breath sounds normal.  Abdominal: Soft. Bowel sounds are normal.  Musculoskeletal: Normal range of motion.  Neurological: He is alert and oriented to person, place, and time.  Skin: Skin is warm and dry.  Psychiatric: He has a normal mood and affect. His behavior is normal.  Nursing note and vitals reviewed.    ED Treatments / Results  Labs (all labs ordered are listed, but only abnormal results are displayed) Labs Reviewed  BASIC METABOLIC PANEL - Abnormal; Notable for the following  components:      Result Value   Glucose, Bld 108 (*)    All other components within normal limits  CBC WITH DIFFERENTIAL/PLATELET  TROPONIN I    EKG  EKG Interpretation  Date/Time:  Tuesday September 25 2017 13:19:21 EST Ventricular Rate:  44 PR Interval:  150 QRS Duration: 100 QT Interval:  468 QTC Calculation: 400 R Axis:   38 Text Interpretation:  Marked sinus bradycardia Possible Inferior infarct , age undetermined Abnormal ECG Confirmed by Donnetta Hutching ([id number]) on 09/25/2017 1:24:13 PM       Radiology Dg Chest Port 1 View  Result Date: 09/25/2017 CLINICAL DATA:  Left-sided chest pain EXAM: PORTABLE CHEST 1 VIEW COMPARISON:  06/05/2017 FINDINGS: Normal heart size. Lungs clear. No  pneumothorax. No pleural effusion. IMPRESSION: No active disease. Electronically Signed   By: Jolaine ClickArthur  Hoss M.D.   On: 09/25/2017 13:53    Procedures Procedures (including critical care time)  Medications Ordered in ED Medications - No data to display   Initial Impression / Assessment and Plan / ED Course  I have reviewed the triage vital signs and the nursing notes.  Pertinent labs & imaging results that were available during my care of the patient were reviewed by me and considered in my medical decision making (see chart for details).     Patient is hemodynamically stable.  He looks well.  He is normally bradycardic.  Screening labs, EKG, chest x-ray, troponin all negative.  He will follow-up with his primary care doctor or cardiologist.  I have asked him to make a blood pressure log with or without lisinopril.  Final Clinical Impressions(s) / ED Diagnoses   Final diagnoses:  Chest pain, unspecified type    ED Discharge Orders    None       Donnetta Hutchingook, Taegan Standage, MD 09/25/17 1624

## 2017-12-24 ENCOUNTER — Encounter (HOSPITAL_COMMUNITY): Payer: Self-pay | Admitting: Emergency Medicine

## 2017-12-24 ENCOUNTER — Emergency Department (HOSPITAL_COMMUNITY)
Admission: EM | Admit: 2017-12-24 | Discharge: 2017-12-24 | Disposition: A | Discharge status: Home / Self Care | Payer: Medicare HMO | Attending: Emergency Medicine | Admitting: Emergency Medicine

## 2017-12-24 DIAGNOSIS — E119 Type 2 diabetes mellitus without complications: Secondary | ICD-10-CM | POA: Diagnosis not present

## 2017-12-24 DIAGNOSIS — Z955 Presence of coronary angioplasty implant and graft: Secondary | ICD-10-CM | POA: Diagnosis not present

## 2017-12-24 DIAGNOSIS — Y999 Unspecified external cause status: Secondary | ICD-10-CM | POA: Diagnosis not present

## 2017-12-24 DIAGNOSIS — Z87891 Personal history of nicotine dependence: Secondary | ICD-10-CM | POA: Insufficient documentation

## 2017-12-24 DIAGNOSIS — I252 Old myocardial infarction: Secondary | ICD-10-CM | POA: Diagnosis not present

## 2017-12-24 DIAGNOSIS — Z7984 Long term (current) use of oral hypoglycemic drugs: Secondary | ICD-10-CM | POA: Diagnosis not present

## 2017-12-24 DIAGNOSIS — Y929 Unspecified place or not applicable: Secondary | ICD-10-CM | POA: Diagnosis not present

## 2017-12-24 DIAGNOSIS — Y939 Activity, unspecified: Secondary | ICD-10-CM | POA: Diagnosis not present

## 2017-12-24 DIAGNOSIS — S80862A Insect bite (nonvenomous), left lower leg, initial encounter: Secondary | ICD-10-CM | POA: Diagnosis present

## 2017-12-24 DIAGNOSIS — Z7901 Long term (current) use of anticoagulants: Secondary | ICD-10-CM | POA: Insufficient documentation

## 2017-12-24 DIAGNOSIS — W57XXXA Bitten or stung by nonvenomous insect and other nonvenomous arthropods, initial encounter: Secondary | ICD-10-CM | POA: Diagnosis not present

## 2017-12-24 DIAGNOSIS — I25810 Atherosclerosis of coronary artery bypass graft(s) without angina pectoris: Secondary | ICD-10-CM | POA: Diagnosis not present

## 2017-12-24 DIAGNOSIS — I10 Essential (primary) hypertension: Secondary | ICD-10-CM | POA: Diagnosis not present

## 2017-12-24 DIAGNOSIS — Z7982 Long term (current) use of aspirin: Secondary | ICD-10-CM | POA: Insufficient documentation

## 2017-12-24 MED ORDER — DOXYCYCLINE HYCLATE 100 MG PO CAPS: 100.0000 mg | ORAL_CAPSULE | Freq: Two times a day (BID) | ORAL | 0 refills | Status: DC

## 2017-12-24 NOTE — ED Provider Notes (Signed)
[facility] EMERGENCY DEPARTMENT      Provider Note  CSN: [id number] Arrival date & time: 12/24/17  0751   History   Chief Complaint Chief Complaint  Patient presents with  . Tick Removal    HPI Shawn Lopez is a 77 y.o. male.  HPI   Shawn Lopez is a 77 y.o. male who presents to the Emergency Department requesting evaluation of a recent tick bite.  He states that he removed a tick from his left lower leg 5 days ago.  He states he removed the tick with his fingers.  He noticed increasing redness around the wound.  He has been applying over-the-counter antibiotic ointment without relief.  He also complains of some itching to the area.  He noticed some low-grade fever and body aches yesterday that he contributed to the tick bite.  He denies vomiting, chills, myalgias or arthralgias today, chest pain or shortness of breath.  Past Medical History:  Diagnosis Date  . Arthritis   . Atrial fibrillation (HCC) 2006   a. in 2006; not on anticoagulation, no recent atrial fib seen as of 2018.  . Borderline hypertension   . CAD (coronary artery disease)    a. NSTEMI 12/2016: cath showing 95% mid-LAD stenosis, 75% prox-LAD stenosis and 75% 1st Diag stenosis. DES to the proximal and mid-LAD along with the 1st Diagonal..  . Chest pain 2001   negative stress nuclear in 2006; negative coronary angiography(no documentation); essentially normal PFTs in 07/2004; CT in 2012-lipomatous hypertrophy; mild atherosclerosis of the aorta and coronaries; mild dependent pulmonary scarring or atelectasis; small area of focal pleural thickening; chronic bronchitic changes on chest x-ray  . Chronic back pain    Prior trauma with vertebral fracture  . Diabetes mellitus   . Hyperlipidemia   . Hypertension   . Osteopenia    by bone densitometry  . Sinus bradycardia    a. metoprolol decreased 05/2017 due to this.  . 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 CAD (coronary artery disease) with PCI 12/2016 06/05/2017  . Chest pain 01/13/2017  . NSTEMI (non-ST elevated myocardial infarction) (HCC) 01/13/2017  . Pain in joint, shoulder region 10/20/2013  . Muscle spasm of right shoulder 10/20/2013  . Right rotator cuff tear 10/14/2013  . 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 Atrial fibrillation (HCC)   . Essential hypertension   . Hyperlipidemia LDL goal <70   . Diabetes mellitus (HCC)   . Osteopenia   . Chronic back pain   . Chest pain     Past Surgical History:  Procedure Laterality Date  . APPENDECTOMY  1960  . CATARACT EXTRACTION  2012   right eye,lens implantation  . CHOLECYSTECTOMY    . CIRCUMCISION  2010   meatotomy and dilatation  . COLONOSCOPY N/A 01/14/2013   Procedure: COLONOSCOPY;  Surgeon: Dalia Heading, MD;  Location: [facility] ENDO [facility]ITE;  Service: Gastroenterology;  Laterality: N/A;  . CORONARY ANGIOGRAPHY N/A 01/15/2017   Procedure: Coronary Angiography;  Surgeon: Runell Gess, MD;  Location: [facility] INVASIVE CV LAB;  Service: Cardiovascular;  Laterality: N/A;  . CORONARY STENT INTERVENTION N/A 01/15/2017   Procedure: Coronary Stent Intervention;  Surgeon: Runell Gess, MD;  Location: [facility] INVASIVE CV LAB;  Service: Cardiovascular;  Laterality: N/A;        Home Medications    Prior to Admission medications   Medication Sig Start Date End Date Taking? Authorizing  Provider  aspirin EC 81 MG EC tablet Take 1 tablet (81 mg total) by mouth daily. 01/17/17   Ghimire, Werner LeanShanker M, MD  atorvastatin (LIPITOR) 80 MG tablet Take 1 tablet (80 mg total) by mouth daily at 6 PM. 03/09/17   Jodelle GrossLawrence, Kathryn M, NP  clopidogrel (PLAVIX) 75 MG tablet Take 1 tablet (75 mg total) by mouth daily. 03/09/17   Jodelle GrossLawrence, Kathryn M, NP  HYDROcodone-acetaminophen (NORCO) 10-325 MG tablet Take 1 tablet by mouth 4 (four) times daily as needed. 08/02/17   [provider]  lisinopril (PRINIVIL,ZESTRIL) 5 MG tablet Take 1 tablet (5  mg total) by mouth daily. 03/09/17   Jodelle GrossLawrence, Kathryn M, NP  metFORMIN (GLUCOPHAGE-XR) 500 MG 24 hr tablet Take 2 tablets by mouth 2 (two) times daily. 07/30/17   [provider]  metoprolol tartrate (LOPRESSOR) 25 MG tablet Take 25 mg by mouth 2 (two) times daily.    [provider]  nitroGLYCERIN (NITROSTAT) 0.4 MG SL tablet Place 1 tablet (0.4 mg total) under the tongue every 5 (five) minutes as needed for chest pain. For 3 doses only 01/16/17   Ghimire, Werner LeanShanker M, MD    Family History Family History  Problem Relation Age of Onset  . CAD Father   . Heart attack Brother     Social History Social History   Tobacco Use  . Smoking status: Former Smoker    Packs/day: 1.50    Years: 15.00    Pack years: 22.50    Types: Cigarettes    Last attempt to quit: 04/20/1997    Years since quitting: 20.6  . Smokeless tobacco: Never Used  Substance Use Topics  . Alcohol use: No  . Drug use: No     Allergies   Brilinta [ticagrelor]   Review of Systems Review of Systems  Constitutional: Negative for chills and fever.  Musculoskeletal: Negative for arthralgias and joint swelling.  Skin:       Tick bite  Neurological: Negative for dizziness, weakness and numbness.  Hematological: Does not bruise/bleed easily.  All other systems reviewed and are negative.    Physical Exam Updated Vital Signs BP (!) 156/64 (BP Location: Right Arm)   Pulse (!) 44   Temp 98.7 F (37.1 C) (Oral)   Resp 18   Ht 5\' 10"  (1.778 m)   Wt 83.9 kg (185 lb)   SpO2 100%   BMI 26.54 kg/m   Physical Exam  Constitutional: He is oriented to person, place, and time. He appears well-developed and well-nourished. No distress.  HENT:  Head: Normocephalic and atraumatic.  Cardiovascular: Normal rate, regular rhythm and intact distal pulses.  No murmur heard. Pulmonary/Chest: Effort normal and breath sounds normal. No respiratory distress.  Musculoskeletal: Normal range of motion. He exhibits  no edema or tenderness.  Neurological: He is alert and oriented to person, place, and time. No sensory deficit. He exhibits normal muscle tone. Coordination normal.  Skin: Skin is warm. Capillary refill takes less than 2 seconds. Laceration noted.  2 cm crusted area to the left lateral lower leg.  Mild surrounding erythema.  No drainage, edema, or rash.  Nursing note and vitals reviewed.    ED Treatments / Results  Labs (all labs ordered are listed, but only abnormal results are displayed) Labs Reviewed - No data to display  EKG None  Radiology No results found.  Procedures Procedures (including critical care time)  Medications Ordered in ED Medications - No data to display   Initial Impression /  Assessment and Plan / ED Course  I have reviewed the triage vital signs and the nursing notes.  Pertinent labs & imaging results that were available during my care of the patient were reviewed by me and considered in my medical decision making (see chart for details).     Patient well-appearing.  Afebrile today no symptoms at present other than itching to the site.  He is mildly bradycardic, but takes beta-blocker daily.  Will treat with antibiotics he agrees to close follow-up with PCP return precautions discussed.    Final Clinical Impressions(s) / ED Diagnoses   Final diagnoses:  Tick bite, initial encounter    ED Discharge Orders    None       Pauline Aus, PA-C 12/25/17 1555    Raeford Razor, MD 12/26/17 [phone]

## 2017-12-24 NOTE — Discharge Instructions (Addendum)
Clean the area with mild soap and water.  You may apply small amount of Neosporin daily.  Follow-up with your primary doctor for recheck or return to the ER for any worsening symptoms such as fever, rash, and increasing joint pains

## 2017-12-24 NOTE — ED Triage Notes (Signed)
Pt reports removing a tick from left leg last Wednesday and the area is red and continues to itch.

## 2018-02-06 IMAGING — US US RENAL
1 series · 14 of 25 positions shown · non-contrast
Comparison: 10/09/2014
COMPARISON: 10/09/2014

ADDENDUM:
CORRECTION:  LUMBAR SPINE COMPLETE

REASON FOR EXAMINATION:  Low back pain.  Sharp right flank pain.
CLINICAL DATA: Low back pain.  Sharp right flank pain.
EXAM:
RENAL / URINARY TRACT ULTRASOUND COMPLETE

[Series 1: us renal · 0.30mm/px · 14 of 44 slices shown]
[im 1/44]
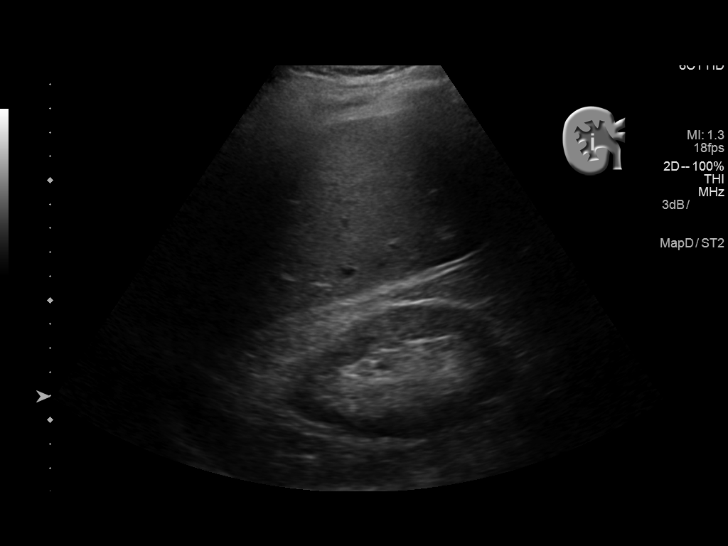
[im 4/44]
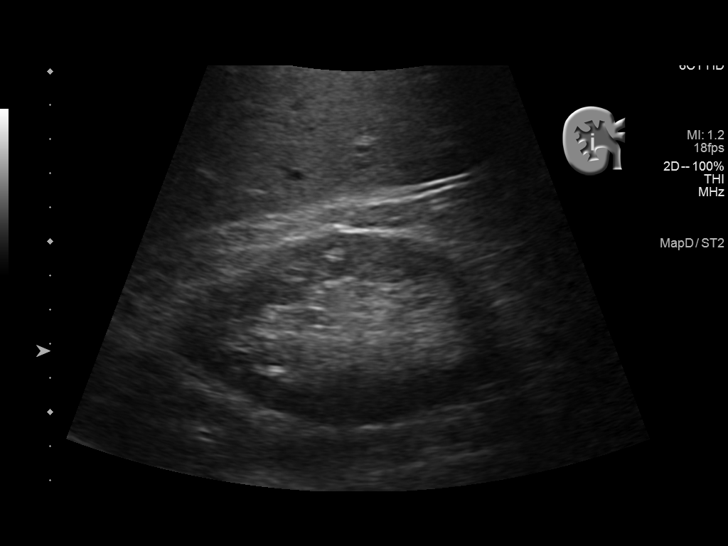
[im 8/44]
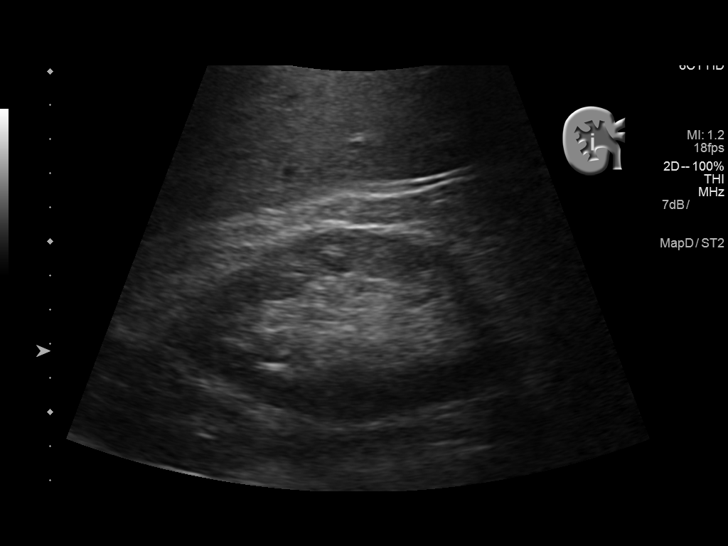
[im 11/44]
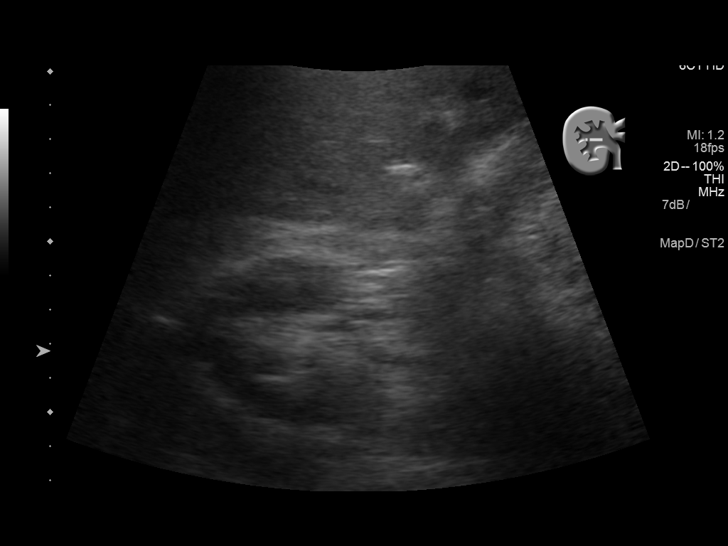
[im 15/44]
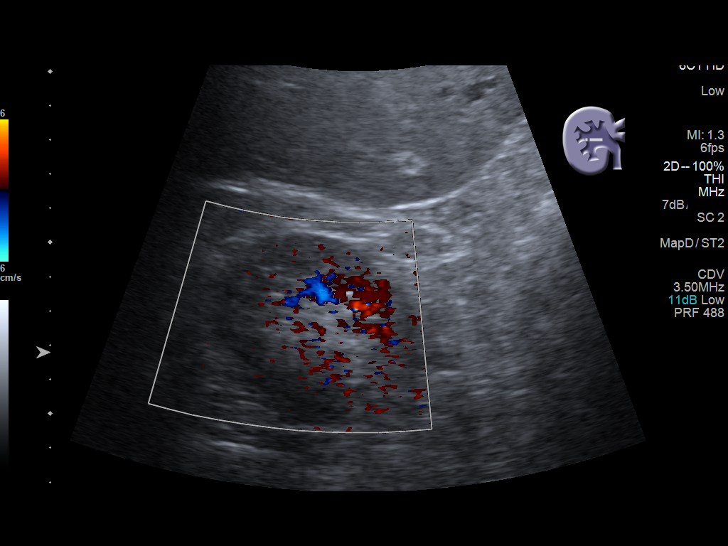
[im 17/44]
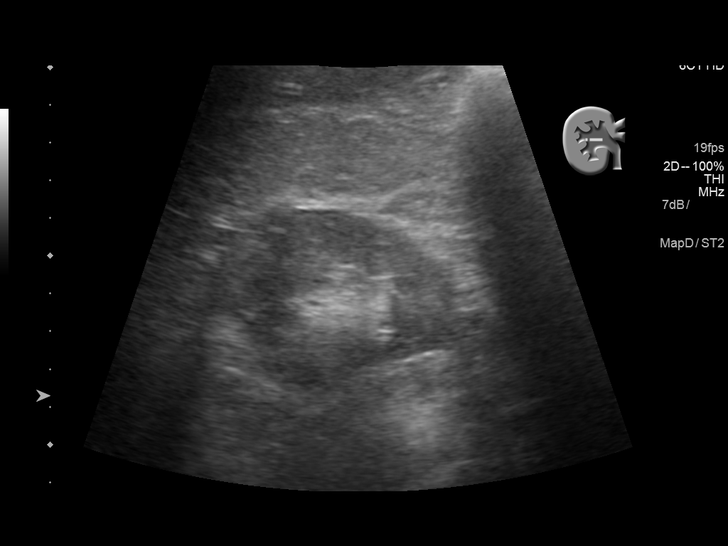
[im 20/44]
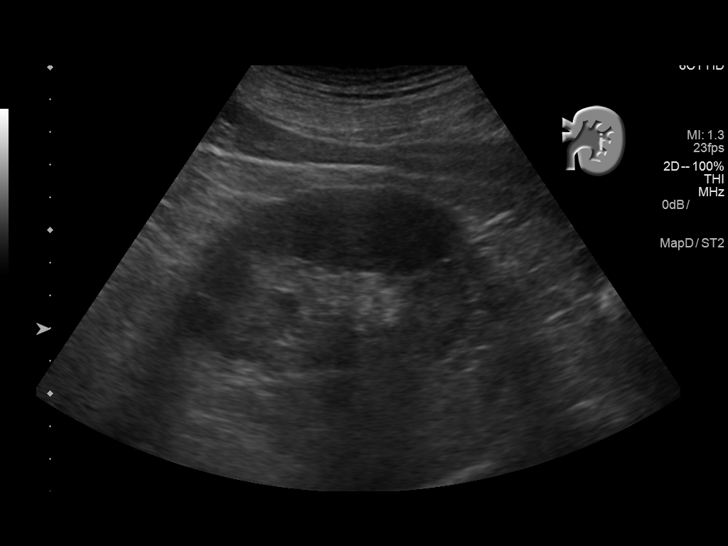
[im 24/44]
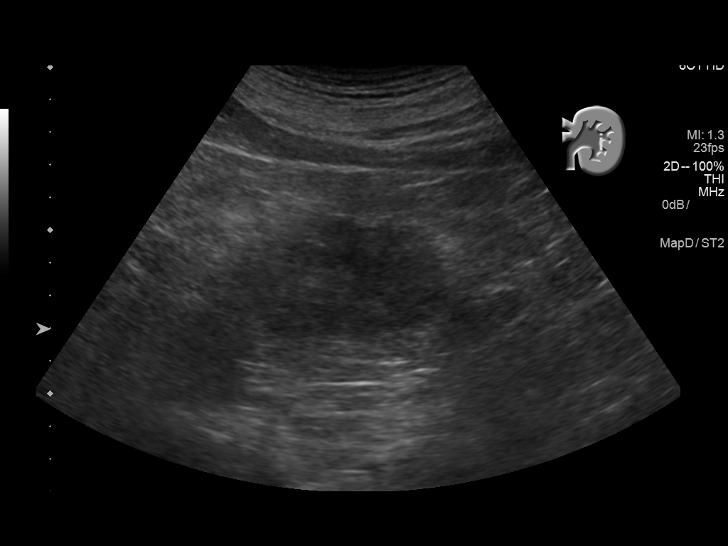
[im 27/44]
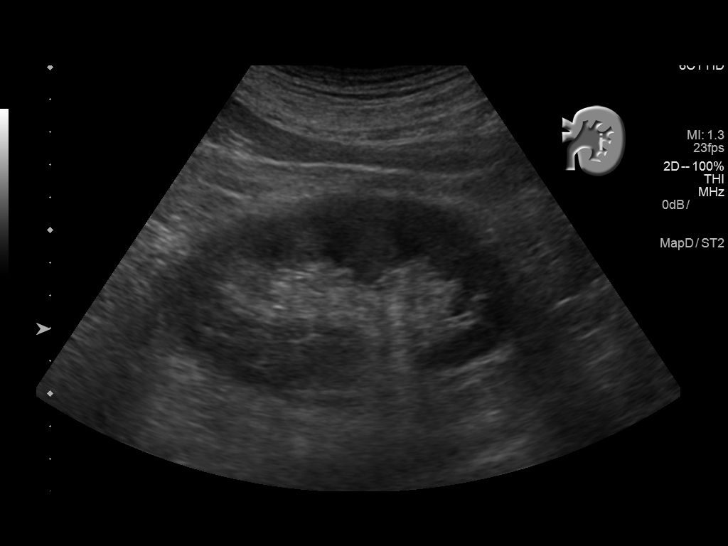
[im 29/44]
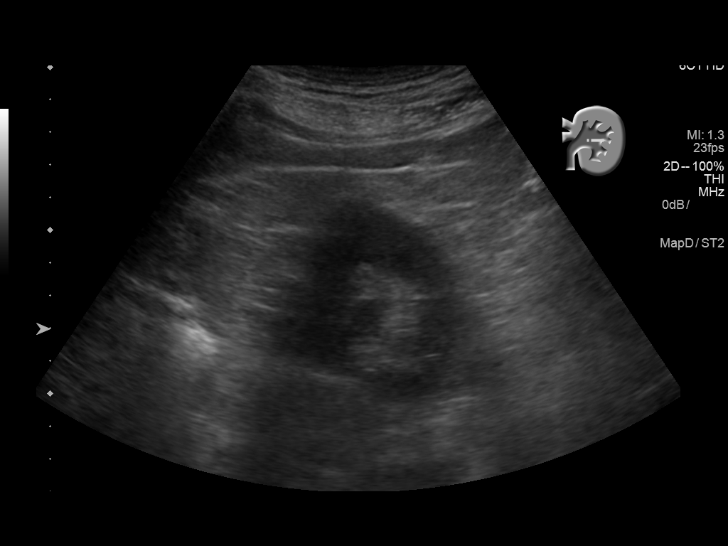
[im 33/44]
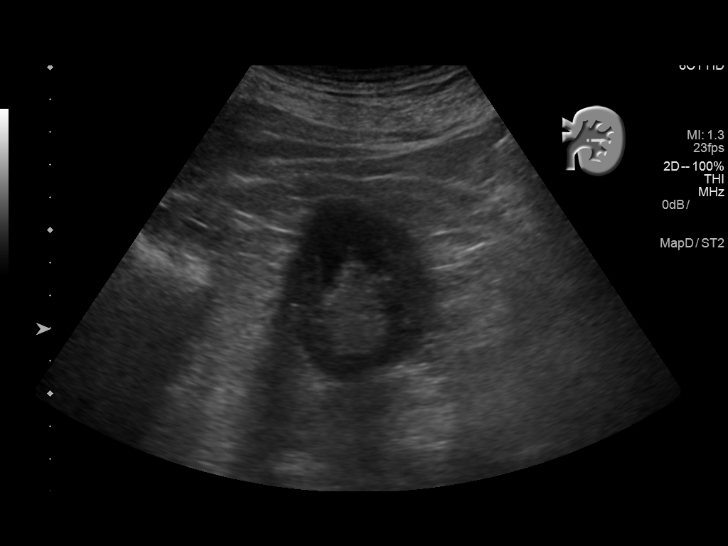
[im 36/44]
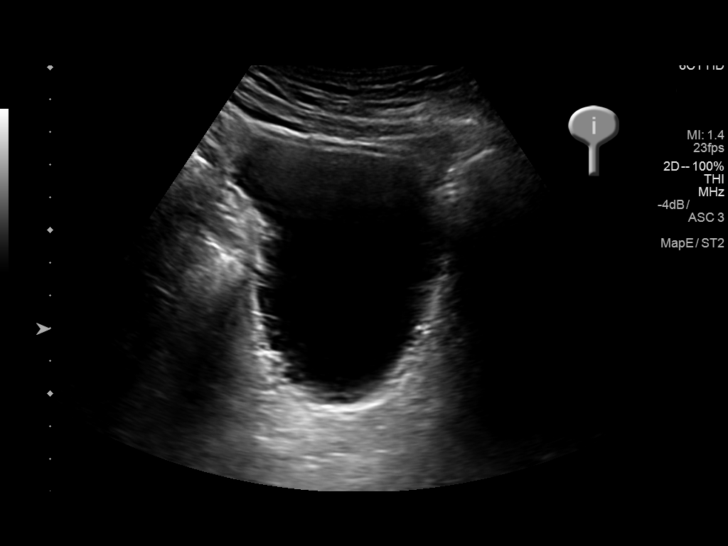
[im 40/44]
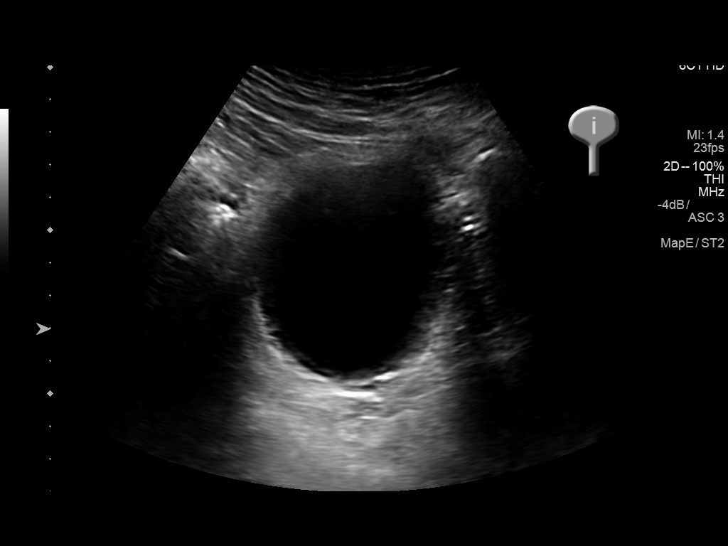
[im 44/44]
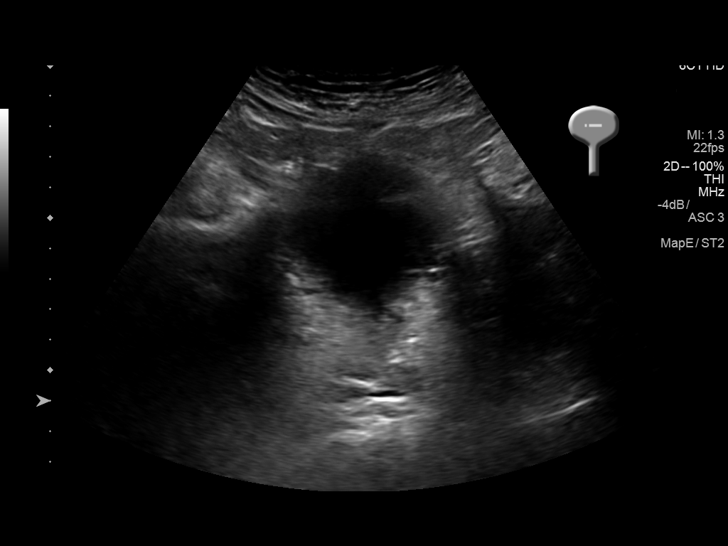

[14 of 25 positions shown; findings below may reference images not displayed]

FINDINGS: There are old stable compression deformities of T12 and L1. The disc
spaces are maintained throughout the lumbar spine. Slight bilateral
facet arthritis at L4-5 and L5-S1, stable.

Slight retrolisthesis of L1 on L2, unchanged. Scattered
calcifications in the abdominal aorta and iliac arteries.
IMPRESSION: No acute abnormalities of the lumbar spine. No change since
10/09/2014.

Degenerative facet arthritis in the lower lumbar spine.

Aortic atherosclerosis.
FINDINGS: Right Kidney:

Length: 10.9 cm. Echogenicity within normal limits. No mass or
hydronephrosis visualized.

Left Kidney:

Length: 11.4 cm. Echogenicity within normal limits. No mass or
hydronephrosis visualized.

Bladder:

Appears normal for degree of bladder distention.
IMPRESSION: Normal exam. Specifically, no evidence of hydronephrosis. No visible
renal or bladder calculi.

## 2018-02-10 ENCOUNTER — Emergency Department (HOSPITAL_COMMUNITY): Payer: Medicare HMO

## 2018-02-10 ENCOUNTER — Emergency Department (HOSPITAL_COMMUNITY)
Admission: EM | Admit: 2018-02-10 | Discharge: 2018-02-10 | Disposition: A | Discharge status: Home / Self Care | Payer: Medicare HMO | Attending: Emergency Medicine | Admitting: Emergency Medicine

## 2018-02-10 ENCOUNTER — Encounter (HOSPITAL_COMMUNITY): Payer: Self-pay | Admitting: Emergency Medicine

## 2018-02-10 ENCOUNTER — Other Ambulatory Visit: Payer: Self-pay

## 2018-02-10 DIAGNOSIS — Z87891 Personal history of nicotine dependence: Secondary | ICD-10-CM | POA: Diagnosis not present

## 2018-02-10 DIAGNOSIS — R079 Chest pain, unspecified: Secondary | ICD-10-CM | POA: Diagnosis not present

## 2018-02-10 DIAGNOSIS — R0789 Other chest pain: Secondary | ICD-10-CM

## 2018-02-10 DIAGNOSIS — I10 Essential (primary) hypertension: Secondary | ICD-10-CM | POA: Diagnosis not present

## 2018-02-10 DIAGNOSIS — Z7982 Long term (current) use of aspirin: Secondary | ICD-10-CM | POA: Insufficient documentation

## 2018-02-10 DIAGNOSIS — I2510 Atherosclerotic heart disease of native coronary artery without angina pectoris: Secondary | ICD-10-CM | POA: Diagnosis not present

## 2018-02-10 DIAGNOSIS — E119 Type 2 diabetes mellitus without complications: Secondary | ICD-10-CM | POA: Diagnosis not present

## 2018-02-10 DIAGNOSIS — Z79899 Other long term (current) drug therapy: Secondary | ICD-10-CM | POA: Diagnosis not present

## 2018-02-10 LAB — CBC
HEMATOCRIT: 45.7 % (ref 39.0–52.0)
HEMOGLOBIN: 14.9 g/dL (ref 13.0–17.0)
MCH: 30.3 pg (ref 26.0–34.0)
MCHC: 32.6 g/dL (ref 30.0–36.0)
MCV: 92.9 fL (ref 78.0–100.0)
Platelets: 189 10*3/uL (ref 150–400)
RBC: 4.92 MIL/uL (ref 4.22–5.81)
RDW: 13.6 % (ref 11.5–15.5)
WBC: 9.2 10*3/uL (ref 4.0–10.5)

## 2018-02-10 LAB — BASIC METABOLIC PANEL
ANION GAP: 8 (ref 5–15)
BUN: 19 mg/dL (ref 6–20)
CALCIUM: 9.6 mg/dL (ref 8.9–10.3)
CO2: 26 mmol/L (ref 22–32)
Chloride: 104 mmol/L (ref 101–111)
Creatinine, Ser: 0.79 mg/dL (ref 0.61–1.24)
GFR calc Af Amer: >60 mL/min (ref >60)
GFR calc non Af Amer: >60 mL/min (ref >60)
GLUCOSE: 118 mg/dL — AB (ref 65–99)
Potassium: 3.9 mmol/L (ref 3.5–5.1)
Sodium: 138 mmol/L (ref 135–145)

## 2018-02-10 LAB — I-STAT TROPONIN, ED
TROPONIN I, POC: 0 ng/mL (ref 0.00–0.08)
Troponin i, poc: 0 ng/mL (ref 0.00–0.08)

## 2018-02-10 MED ORDER — ASPIRIN 81 MG PO CHEW
324.0000 mg | CHEWABLE_TABLET | Freq: Once | ORAL | Status: AC
  Administered 2018-02-10: 324 mg via ORAL
  Filled 2018-02-10: qty 4

## 2018-02-10 MED ORDER — MORPHINE SULFATE (PF) 4 MG/ML IV SOLN
4.0000 mg | INTRAVENOUS | Status: DC | PRN
  Filled 2018-02-10: qty 1

## 2018-02-10 NOTE — ED Notes (Signed)
Pt reports eating a hotdog for lunch

## 2018-02-10 NOTE — Discharge Instructions (Addendum)
Take your usual prescriptions as previously directed.  Apply moist heat or ice to the area(s) of discomfort, for 15 minutes at a time, several times per day for the next few days.  Do not fall asleep on a heating or ice pack.  Call your regular Cardiologist tomorrow morning to schedule a follow up appointment in the next 2 days.  Return to the Emergency Department immediately if worsening.

## 2018-02-10 NOTE — ED Notes (Signed)
Pt with hx of four stints  Watching TV today and had sudden onset of sharp l sided CP Took one NTG with relief

## 2018-02-10 NOTE — ED Notes (Signed)
Pt offered MS - pt refused saying when he hurts bad enough he will ask for it  Willingly took asa 324 mg  Pt is relaxed resting and awaiting results and dispo

## 2018-02-10 NOTE — ED Triage Notes (Signed)
Patient c/o left side chest pain that started 2 hours ago while sitting on sofa , watching TV. Denies any radiation in pain, shortness of breath, nausea, vomiting, or dizziness. Per patient took 1 SL nitro, approx 45 minutes ago, with no relief. Patient has cardiac hx with 4 stents.

## 2018-02-10 NOTE — ED Notes (Signed)
Trop 0.00  Resulted attempting to get it to cross

## 2018-02-10 NOTE — ED Notes (Signed)
Permission from Dr Mike Gip for pt to take his home meds   Water provided  Call for meal - second request

## 2018-02-10 NOTE — ED Provider Notes (Signed)
[facility] EMERGENCY DEPARTMENT Provider Note   CSN: [id number] Arrival date & time: 02/10/18  1727     History   Chief Complaint Chief Complaint  Patient presents with  . Chest Pain    HPI Shawn Lopez is a 77 y.o. male.  HPI  Pt was seen at 1755. Per pt, c/o sudden onset and persistence of multiple intermittent episodes of chest "pain" that began approximately 1500 PTA. Pt states he was sitting in a chair when he began to have "sharp" left lateral chest wall pain (one specific area) that lasts for 1 or 2 seconds each episode. Pt states he took one SL ntg without change in his symptoms. Denies any radiation of pain. Denies any associated symptoms. Denies CP feels like his previous cardiac pain.  Denies palpitations, no SOB/cough, no abd pain, no N/V/D, no fevers, no rash, no injury.    Past Medical History:  Diagnosis Date  . Arthritis   . Atrial fibrillation (HCC) 2006   a. in 2006; not on anticoagulation, no recent atrial fib seen as of 2018.  . Borderline hypertension   . CAD (coronary artery disease)    a. NSTEMI 12/2016: cath showing 95% mid-LAD stenosis, 75% prox-LAD stenosis and 75% 1st Diag stenosis. DES to the proximal and mid-LAD along with the 1st Diagonal..  . Chest pain 2001   negative stress nuclear in 2006; negative coronary angiography(no documentation); essentially normal PFTs in 07/2004; CT in 2012-lipomatous hypertrophy; mild atherosclerosis of the aorta and coronaries; mild dependent pulmonary scarring or atelectasis; small area of focal pleural thickening; chronic bronchitic changes on chest x-ray  . Chronic back pain    Prior trauma with vertebral fracture  . Diabetes mellitus   . H/O cardiovascular stress test 05/2017   low risk study  . Hyperlipidemia   . Hypertension   . Osteopenia    by bone densitometry  . Sinus bradycardia    a. metoprolol decreased 05/2017 due to this.  . 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 CAD (coronary artery disease) with PCI 12/2016 06/05/2017  . Chest pain 01/13/2017  . NSTEMI (non-ST elevated myocardial infarction) (HCC) 01/13/2017  . Pain in joint, shoulder region 10/20/2013  . Muscle spasm of right shoulder 10/20/2013  . Right rotator cuff tear 10/14/2013  . 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 Atrial fibrillation (HCC)   . Essential hypertension   . Hyperlipidemia LDL goal <70   . Diabetes mellitus (HCC)   . Osteopenia   . Chronic back pain   . Chest pain     Past Surgical History:  Procedure Laterality Date  . APPENDECTOMY  1960  . CATARACT EXTRACTION  2012   right eye,lens implantation  . CHOLECYSTECTOMY    . CIRCUMCISION  2010   meatotomy and dilatation  . COLONOSCOPY N/A 01/14/2013   Procedure: COLONOSCOPY;  Surgeon: Dalia Heading, MD;  [facility];  Service: Gastroenterology;  Laterality: N/A;  . CORONARY ANGIOGRAPHY N/A 01/15/2017   Procedure: Coronary Angiography;  Surgeon: Runell Gess, MD;  Location: [facility] INVASIVE CV LAB;  Service: Cardiovascular;  Laterality: N/A;  . CORONARY STENT INTERVENTION N/A 01/15/2017   Procedure: Coronary Stent Intervention;  Surgeon: Runell Gess, MD;  Location: [facility] INVASIVE CV LAB;  Service: Cardiovascular;  Laterality: N/A;        Home Medications    Prior to Admission medications   Medication Sig Start Date  End Date Taking? Authorizing Provider  aspirin EC 81 MG EC tablet Take 1 tablet (81 mg total) by mouth daily. 01/17/17  Yes Ghimire, Werner Lean, MD  atorvastatin (LIPITOR) 80 MG tablet Take 1 tablet (80 mg total) by mouth daily at 6 PM. 03/09/17  Yes Jodelle Gross, NP  clopidogrel (PLAVIX) 75 MG tablet Take 1 tablet (75 mg total) by mouth daily. 03/09/17  Yes Jodelle Gross, NP  HYDROcodone-acetaminophen (NORCO) 10-325 MG tablet Take 1 tablet by mouth 4 (four) times daily as needed. 08/02/17  Yes [provider]  lisinopril (PRINIVIL,ZESTRIL)  5 MG tablet Take 1 tablet (5 mg total) by mouth daily. 03/09/17  Yes Jodelle Gross, NP  metoprolol tartrate (LOPRESSOR) 25 MG tablet Take 25 mg by mouth 2 (two) times daily.   Yes [provider]  nitroGLYCERIN (NITROSTAT) 0.4 MG SL tablet Place 1 tablet (0.4 mg total) under the tongue every 5 (five) minutes as needed for chest pain. For 3 doses only 01/16/17  Yes Ghimire, Werner Lean, MD  doxycycline (VIBRAMYCIN) 100 MG capsule Take 1 capsule (100 mg total) by mouth 2 (two) times daily. For 10 days Patient not taking: Reported on 02/10/2018 12/24/17   Pauline Aus, PA-C  metFORMIN (GLUCOPHAGE-XR) 500 MG 24 hr tablet Take 2 tablets by mouth 2 (two) times daily. 07/30/17   [provider]    Family History Family History  Problem Relation Age of Onset  . CAD Father   . Heart attack Brother     Social History Social History   Tobacco Use  . Smoking status: Former Smoker    Packs/day: 1.50    Years: 15.00    Pack years: 22.50    Types: Cigarettes    Last attempt to quit: 04/20/1997    Years since quitting: 20.8  . Smokeless tobacco: Never Used  Substance Use Topics  . Alcohol use: No  . Drug use: No     Allergies   Brilinta [ticagrelor]   Review of Systems Review of Systems ROS: Statement: All systems negative except as marked or noted in the HPI; Constitutional: Negative for fever and chills. ; ; Eyes: Negative for eye pain, redness and discharge. ; ; ENMT: Negative for ear pain, hoarseness, nasal congestion, sinus pressure and sore throat. ; ; Cardiovascular: Negative for palpitations, diaphoresis, dyspnea and peripheral edema. ; ; Respiratory: Negative for cough, wheezing and stridor. ; ; Gastrointestinal: Negative for nausea, vomiting, diarrhea, abdominal pain, blood in stool, hematemesis, jaundice and rectal bleeding. . ; ; Genitourinary: Negative for dysuria, flank pain and hematuria. ; ; Musculoskeletal: +CP. Negative for back pain and neck pain. Negative  for swelling and trauma.; ; Skin: Negative for pruritus, rash, abrasions, blisters, bruising and skin lesion.; ; Neuro: Negative for headache, lightheadedness and neck stiffness. Negative for weakness, altered level of consciousness, altered mental status, extremity weakness, paresthesias, involuntary movement, seizure and syncope.       Physical Exam Updated Vital Signs BP (!) 145/69   Pulse (!) 55   Temp 98 F (36.7 C) (Oral)   Resp 13   Ht  (1.778 m)   Wt 83.9 kg (185 lb)   SpO2 96%   BMI 26.54 kg/m   Physical Exam 1800: Physical examination:  Nursing notes reviewed; Vital signs and O2 SAT reviewed;  Constitutional: Well developed, Well nourished, Well hydrated, In no acute distress; Head:  Normocephalic, atraumatic; Eyes: EOMI, PERRL, No scleral icterus; ENMT: Mouth and pharynx normal, Mucous membranes moist; Neck:  Supple, Full range of motion, No lymphadenopathy; Cardiovascular: Regular rate and rhythm, No gallop; Respiratory: Breath sounds clear & equal bilaterally, No wheezes.  Speaking full sentences with ease, Normal respiratory effort/excursion; Chest: +left mid-lateral chest wall tender to palp. No soft tissue crepitus, no deformity. Movement normal; Abdomen: Soft, Nontender, Nondistended, Normal bowel sounds; Genitourinary: No CVA tenderness; Extremities: Peripheral pulses normal, No tenderness, No edema, No calf edema or asymmetry.; Neuro: AA&Ox3, Major CN grossly intact.  Speech clear. No gross focal motor or sensory deficits in extremities.; Skin: Color normal, Warm, Dry.   ED Treatments / Results  Labs (all labs ordered are listed, but only abnormal results are displayed)   EKG EKG Interpretation  Date/Time:  Sunday Feb 10 2018 17:35:34 EDT Ventricular Rate:  55 PR Interval:    QRS Duration: 114 QT Interval:  459 QTC Calculation: 439 R Axis:   48 Text Interpretation:  Sinus rhythm Borderline intraventricular conduction delay No significant change since last  tracing Confirmed by Doug Sou [phone]) on 02/10/2018 5:42:17 PM   Radiology   Procedures Procedures (including critical care time)  Medications Ordered in ED Medications  morphine 4 MG/ML injection 4 mg (has no administration in time range)  aspirin chewable tablet 324 mg (324 mg Oral Given 02/10/18 1814)     Initial Impression / Assessment and Plan / ED Course  I have reviewed the triage vital signs and the nursing notes.  Pertinent labs & imaging results that were available during my care of the patient were reviewed by me and considered in my medical decision making (see chart for details).  MDM Reviewed: previous chart, nursing note and vitals Reviewed previous: labs and ECG Interpretation: labs, ECG and x-ray   Results for orders placed or performed during the hospital encounter of 02/10/18  Basic metabolic panel  Result Value Ref Range   Sodium 138 135 - 145 mmol/L   Potassium 3.9 3.5 - 5.1 mmol/L   Chloride 104 101 - 111 mmol/L   CO2 26 22 - 32 mmol/L   Glucose, Bld 118 (H) 65 - 99 mg/dL   BUN 19 6 - 20 mg/dL   Creatinine, Ser 2.13 0.61 - 1.24 mg/dL   Calcium 9.6 8.9 - 08.6 mg/dL   GFR calc non Af Amer >60 >60 mL/min   GFR calc Af Amer >60 >60 mL/min   Anion gap 8 5 - 15  CBC  Result Value Ref Range   WBC 9.2 4.0 - 10.5 K/uL   RBC 4.92 4.22 - 5.81 MIL/uL   Hemoglobin 14.9 13.0 - 17.0 g/dL   HCT 57.8 46.9 - 62.9 %   MCV 92.9 78.0 - 100.0 fL   MCH 30.3 26.0 - 34.0 pg   MCHC 32.6 30.0 - 36.0 g/dL   RDW 52.8 41.3 - 24.4 %   Platelets 189 150 - 400 K/uL  I-stat troponin, ED  Result Value Ref Range   Troponin i, poc 0.00 0.00 - 0.08 ng/mL   Comment 3          I-stat troponin, ED  Result Value Ref Range   Troponin i, poc 0.00 0.00 - 0.08 ng/mL   Comment 3           Dg Chest 2 View Result Date: 02/10/2018 CLINICAL DATA:  Patient with left-sided chest pain. EXAM: CHEST - 2 VIEW COMPARISON:  Chest radiograph 09/25/2017 FINDINGS: Monitoring leads overlie  the patient. Stable cardiomegaly. Tortuosity of the thoracic aorta. Bibasilar heterogeneous pulmonary opacities. No pleural effusion or  pneumothorax. Thoracic spine degenerative changes. IMPRESSION: No acute cardiopulmonary process.  Basilar atelectasis. Electronically Signed   By: Annia Belt M.D.   On: 02/10/2018 18:43    2145:  Doubt PE as cause for symptoms with low risk [place].  Doubt ACS as cause for symptoms with normal troponin x2 and unchanged EKG from previous after 6+ hours of constant atypical symptoms, as well as hx of low risk stress test 05/2017. Pt states he is ready to go home now. Tx symptomatically, f/u Cards MD.  Dx and testing d/w pt and family.  Questions answered.  Verb understanding, agreeable to d/c home with outpt f/u.     Final Clinical Impressions(s) / ED Diagnoses   Final diagnoses:  None    ED Discharge Orders    None       Samuel Jester, DO 02/15/18 [id number]

## 2018-02-10 NOTE — ED Notes (Signed)
Family member went to [employer] ) to purchase meal for pt

## 2018-03-25 ENCOUNTER — Encounter: Payer: Self-pay | Admitting: Cardiology

## 2018-03-25 NOTE — Progress Notes (Signed)
Cardiology Office Note  Date: 03/26/2018   ID: Shawn Lopez, DOB 11-08-1940, MRN [id number]  PCP: Gareth MorganKnowlton, [name], MD  Primary Cardiologist: Nona DellSamuel Sairah Knobloch, MD   Chief Complaint  Patient presents with  . Coronary Artery Disease    History of Present Illness: Shawn Lopez is a 77 y.o. male that I am seeing for the first time in clinic today.  He is a former patient of Dr. Dietrich Patesothbart, more recently seen by Ms. Strader PA-C and Ms. Liborio NixonLawrence DNP.  He presents for a routine visit.  Reports recurring intermittent left-sided chest discomfort and also a burning sensation that is usually at nighttime after he lays down.  Symptoms are not exertional and do not respond to nitroglycerin.  He has not tried an antacid. Follow-up Myoview in September of last year was low risk as outlined below.  I reviewed his current cardiac medications which include aspirin, Plavix, Lipitor, lisinopril, and Lopressor.  He has nitroglycerin available.  He continues to follow with [name] with lipid panel yearly.  Last LDL was 55.  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  Past Medical History:  Diagnosis Date  . Arthritis   . Atrial fibrillation (HCC) 2006   a. in 2006; not on anticoagulation, no recent atrial fib seen as of 2018.  . Borderline hypertension   . CAD (coronary artery disease)    a. NSTEMI 12/2016: cath showing 95% mid-LAD stenosis, 75% prox-LAD stenosis and 75% 1st Diag stenosis. DES to the proximal and mid-LAD along with the 1st Diagonal..  . Chronic back pain    Prior trauma with vertebral fracture  . Hyperlipidemia   . Hypertension   . Osteopenia   . Sinus bradycardia    a. metoprolol decreased 05/2017 due to this.  . Type 2 diabetes mellitus (HCC)     Past Surgical History:  Procedure Laterality Date  . APPENDECTOMY  1960  . CATARACT EXTRACTION  2012   right eye,lens implantation  . CHOLECYSTECTOMY      . CIRCUMCISION  2010   meatotomy and dilatation  . COLONOSCOPY N/A 01/14/2013   Procedure: COLONOSCOPY;  Surgeon: Dalia HeadingMark A Jenkins, MD;  Location: [facility];  Service: Gastroenterology;  Laterality: N/A;  . CORONARY ANGIOGRAPHY N/A 01/15/2017   Procedure: Coronary Angiography;  Surgeon: Runell GessJonathan J Berry, MD;  [facility];  Service: Cardiovascular;  Laterality: N/A;  . CORONARY STENT INTERVENTION N/A 01/15/2017   Procedure: Coronary Stent Intervention;  Surgeon: Runell GessJonathan J Berry, MD;  Location: [facility] INVASIVE CV LAB;  Service: Cardiovascular;  Laterality: N/A;    Current Outpatient Medications  Medication Sig Dispense Refill  . aspirin EC 81 MG EC tablet Take 1 tablet (81 mg total) by mouth daily. 30 tablet 0  . atorvastatin (LIPITOR) 80 MG tablet Take 1 tablet (80 mg total) by mouth daily at 6 PM. 90 tablet 3  . clopidogrel (PLAVIX) 75 MG tablet Take 1 tablet (75 mg total) by mouth daily. 90 tablet 3  . HYDROcodone-acetaminophen (NORCO) 10-325 MG tablet Take 1 tablet by mouth 4 (four) times daily as needed.  0  . lisinopril (PRINIVIL,ZESTRIL) 5 MG tablet Take 1 tablet (5 mg total) by mouth daily. (Patient taking differently: Take 2.5 mg by mouth daily. ) 90 tablet 3  . metFORMIN (GLUCOPHAGE-XR) 500 MG 24 hr tablet Take 2 tablets by mouth 2 (two) times daily.  3  . metoprolol tartrate (LOPRESSOR)  25 MG tablet Take 12.5 mg by mouth 2 (two) times daily.     . nitroGLYCERIN (NITROSTAT) 0.4 MG SL tablet Place 1 tablet (0.4 mg total) under the tongue every 5 (five) minutes as needed for chest pain. For 3 doses only 30 tablet 0   No current facility-administered medications for this visit.    Allergies:  Brilinta [ticagrelor]   Social History: The patient  reports that he quit smoking about 20 years ago. His smoking use included cigarettes. He has a 22.50 pack-year smoking history. He has never used smokeless tobacco. He reports that he does not drink alcohol or use drugs.   ROS:   Please see the history of present illness. 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  All other systems are reviewed and negative.   Physical Exam: VS:  BP 128/68 (BP Location: Right Arm)   Pulse 70   Ht 5\' 10"  (1.778 m)   Wt 194 lb (88 kg)   SpO2 97%   BMI 27.84 kg/m , BMI Body mass index is 27.84 kg/m.  Wt Readings from Last 3 Encounters:  03/26/18 194 lb (88 kg)  02/10/18 185 lb (83.9 kg)  12/24/17 185 lb (83.9 kg)    General: Elderly male, appears comfortable at rest. HEENT: Conjunctiva and lids normal, oropharynx clear. Neck: Supple, no elevated JVP or carotid bruits, no thyromegaly. Lungs: Clear to auscultation, nonlabored breathing at rest. Cardiac: Regular rate and rhythm, no S3 or significant systolic murmur, no pericardial rub. Abdomen: Soft, nontender, bowel sounds present. Extremities: No pitting edema, distal pulses 2+. Skin: Warm and dry. Musculoskeletal: No kyphosis. Neuropsychiatric: Alert and oriented x3, affect grossly appropriate.  ECG: I personally reviewed the tracing from 02/10/2018 which showed sinus rhythm.  Recent Labwork: 06/05/2017: TSH 2.751 02/10/2018: BUN 19; Creatinine, Ser 0.79; Hemoglobin 14.9; Platelets 189; Potassium 3.9; Sodium 138     Component Value Date/Time   CHOL 123 01/15/2017 0702   TRIG 158 (H) 01/15/2017 0702   HDL 36 (L) 01/15/2017 0702   CHOLHDL 3.4 01/15/2017 0702   VLDL 32 01/15/2017 0702   LDLCALC 55 01/15/2017 0702    Other Studies Reviewed Today:  Cardiac catheterization and PCI 01/15/2017:  Mid LAD to Dist LAD lesion, 95 %stenosed.  Prox LAD to Mid LAD lesion, 75 %stenosed.  Post intervention, there is a 0% residual stenosis.  A stent was successfully placed.  1st Diag lesion, 75 %stenosed.  Post intervention, there is a 0% residual stenosis.  A stent was successfully placed.  IMPRESSION: Successful proximal and mid LAD PCI and stenting as well as mid first diagonal branch PCI and  drug-eluting stenting using synergy drug-eluting stents. The sheath was removed and a TR band was placed on the right wrist to achieve patent hemostasis. The patient left the lab in stable condition. We will continue Angiomax with bolus for 4 hours after which it'll be discontinued. The patient will most likely be able to be discharged tomorrow.  Echocardiogram 01/14/2017: Study Conclusions  - Left ventricle: The cavity size was normal. There was mild focal   basal hypertrophy of the septum. Systolic function was normal.   The estimated ejection fraction was in the range of 55% to 60%.   Wall motion was normal; there were no regional wall motion   abnormalities. Doppler parameters are consistent with abnormal   left ventricular relaxation (grade 1 diastolic dysfunction).   Doppler parameters are consistent with high ventricular filling   pressure. - Aortic valve: There  was trivial regurgitation. - Mitral valve: Calcified annulus. Mildly thickened leaflets . - Left atrium: The atrium was moderately dilated. - Atrial septum: There was increased thickness of the septum,   consistent with lipomatous hypertrophy. - Pulmonary arteries: Systolic pressure was mildly increased. PA   peak pressure: 32 mm Hg (S).  Impressions:  - Normal LV systolic function; grade 1 diastolic dysfunction with   elevated LV filling pressure; trace AI; moderate LAE; mild TR   with mildly elevated pulmonary pressure.  Lexiscan Myoview 06/06/2017:  There was no ST segment deviation noted during stress.  The study is normal. No myocardial ischemia or scar.  This is a low risk study.  Nuclear stress EF: 56%.  Assessment and Plan:  1.  CAD with history of DES intervention to the proximal and mid LAD as well as diagonal branch in April 2018.  Reports intermittent chest discomfort that is somewhat atypical in description, will try an antacid to see if this could be related to reflux.  Otherwise he underwent  follow-up ischemic testing in September 2018 which was low risk as outlined above.  Continue medical therapy and observation for now.  2.  Remote history of atrial fibrillation, not recurrent, and not anticoagulated long-term.  3.  Mixed hyperlipidemia, continue to Lipitor with follow-up per Dr. Sudie Bailey.  Last LDL was 55.  4.  Essential hypertension, blood pressure control is adequate today.  Current medicines were reviewed with the patient today.  Disposition: Follow-up in 6 months.  Signed, Jonelle Sidle, MD, [facility] 03/26/2018 11:38 AM    [facility] at [facility] [address], [place], [place] [address] Phone: [phone]; Fax: [phone]

## 2018-03-26 ENCOUNTER — Encounter: Payer: Self-pay | Admitting: Cardiology

## 2018-03-26 ENCOUNTER — Ambulatory Visit: Payer: Medicare HMO | Admitting: Cardiology

## 2018-03-26 VITALS — BP 128/68 | HR 70 | Ht 70.0 in | Wt 194.0 lb

## 2018-03-26 DIAGNOSIS — Z8679 Personal history of other diseases of the circulatory system: Secondary | ICD-10-CM | POA: Diagnosis not present

## 2018-03-26 DIAGNOSIS — I25119 Atherosclerotic heart disease of native coronary artery with unspecified angina pectoris: Secondary | ICD-10-CM | POA: Diagnosis not present

## 2018-03-26 DIAGNOSIS — E782 Mixed hyperlipidemia: Secondary | ICD-10-CM | POA: Diagnosis not present

## 2018-03-26 DIAGNOSIS — I10 Essential (primary) hypertension: Secondary | ICD-10-CM

## 2018-03-26 NOTE — Patient Instructions (Addendum)
Your physician wants you to follow-up in:6 months with Dr.McDowell You will receive a reminder letter in the mail two months in advance. If you don't receive a letter, please call our office to schedule the follow-up appointment.   Your physician recommends that you continue on your current medications as directed. Please refer to the Current Medication list given to you today.   If you need a refill on your cardiac medications before your next appointment, please call your pharmacy.    No lab work or tests ordered today.      Thank you for choosing [facility] !         

## 2018-10-30 ENCOUNTER — Encounter: Payer: Self-pay | Admitting: Cardiology

## 2018-10-30 ENCOUNTER — Ambulatory Visit: Payer: Medicare HMO | Admitting: Cardiology

## 2018-10-30 VITALS — BP 144/70 | HR 54 | Ht 70.5 in | Wt 202.0 lb

## 2018-10-30 DIAGNOSIS — Z8679 Personal history of other diseases of the circulatory system: Secondary | ICD-10-CM

## 2018-10-30 DIAGNOSIS — I10 Essential (primary) hypertension: Secondary | ICD-10-CM | POA: Diagnosis not present

## 2018-10-30 DIAGNOSIS — I25119 Atherosclerotic heart disease of native coronary artery with unspecified angina pectoris: Secondary | ICD-10-CM

## 2018-10-30 DIAGNOSIS — E782 Mixed hyperlipidemia: Secondary | ICD-10-CM

## 2018-10-30 MED ORDER — ISOSORBIDE MONONITRATE ER 30 MG PO TB24: 15.0000 mg | ORAL_TABLET | Freq: Every day | ORAL | 3 refills | Status: DC

## 2018-10-30 NOTE — Progress Notes (Signed)
Cardiology Office Note  Date: 10/30/2018   ID: Shawn Lopez, [name] Apr 13, 1941, MRN [id number]  PCP: [name], MD  Primary Cardiologist: [name], MD   Chief Complaint  Patient presents with  . Coronary Artery Disease    History of Present Illness: Shawn Lopez is a 78 y.o. male last seen in July 2019.  He presents for a routine visit.  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  He reports a few episodes of angina in the last several weeks, has used a total of 2 nitroglycerin in the last 6 months however.  Some of his symptoms are at nighttime, he is not having recurring exertional symptoms at this point.  I reviewed his medications which are outlined below.  We discussed the addition of Imdur for additional antianginal benefit.  Last cardiac testing was in 2018.  Past Medical History:  Diagnosis Date  . Arthritis   . Atrial fibrillation (HCC) 2006   a. in 2006; not on anticoagulation, no recent atrial fib seen as of 2018.  . Borderline hypertension   . CAD (coronary artery disease)    a. NSTEMI 12/2016: cath showing 95% mid-LAD stenosis, 75% prox-LAD stenosis and 75% 1st Diag stenosis. DES to the proximal and mid-LAD along with the 1st Diagonal..  . Chronic back pain    Prior trauma with vertebral fracture  . Hyperlipidemia   . Hypertension   . Osteopenia   . Sinus bradycardia    a. metoprolol decreased 05/2017 due to this.  . Type 2 diabetes mellitus (HCC)     Past Surgical History:  Procedure Laterality Date  . APPENDECTOMY  1960  . CATARACT EXTRACTION  2012   right eye,lens implantation  . CHOLECYSTECTOMY    . CIRCUMCISION  2010   meatotomy and dilatation  . COLONOSCOPY N/A 01/14/2013   Procedure: COLONOSCOPY;  Surgeon: Dalia Heading, MD;  [facility];  Service: Gastroenterology;  Laterality: N/A;  . CORONARY ANGIOGRAPHY N/A 01/15/2017   Procedure: Coronary  Angiography;  Surgeon: Runell Gess, MD;  Location[facility] INVASIVE CV LAB;  Service: Cardiovascular;  Laterality: N/A;  . CORONARY STENT INTERVENTION N/A 01/15/2017   Procedure: Coronary Stent Intervention;  Surgeon: Runell Gess, MD;  Location: [facility] INVASIVE CV LAB;  Service: Cardiovascular;  Laterality: N/A;    Current Outpatient Medications  Medication Sig Dispense Refill  . aspirin EC 81 MG EC tablet Take 1 tablet (81 mg total) by mouth daily. 30 tablet 0  . atorvastatin (LIPITOR) 80 MG tablet Take 1 tablet (80 mg total) by mouth daily at 6 PM. 90 tablet 3  . clopidogrel (PLAVIX) 75 MG tablet Take 1 tablet (75 mg total) by mouth daily. 90 tablet 3  . glimepiride (AMARYL) 1 MG tablet TK 1 T PO QAM FOR DIABETES    . HYDROcodone-acetaminophen (NORCO) 10-325 MG tablet Take 1 tablet by mouth 4 (four) times daily as needed.  0  . lisinopril (PRINIVIL,ZESTRIL) 5 MG tablet Take 1 tablet (5 mg total) by mouth daily. (Patient taking differently: Take 2.5 mg by mouth daily. ) 90 tablet 3  . metoprolol tartrate (LOPRESSOR) 25 MG tablet Take 12.5 mg by mouth 2 (two) times daily.     . nitroGLYCERIN (NITROSTAT) 0.4 MG SL tablet Place 1 tablet (0.4 mg total) under the tongue every 5 (five) minutes as needed for chest pain. For 3 doses only 30 tablet 0  . isosorbide mononitrate (  IMDUR) 30 MG 24 hr tablet Take 0.5 tablets (15 mg total) by mouth daily. 90 tablet 3   No current facility-administered medications for this visit.    Allergies:  Brilinta [ticagrelor]   Social History: The patient  reports that he quit smoking about 21 years ago. His smoking use included cigarettes. He has a 22.50 pack-year smoking history. He has never used smokeless tobacco. He reports that he does not drink alcohol or use drugs.   ROS:  Please see the history of present illness. Otherwise, complete review of systems is positive for none.  All other systems are reviewed and negative.   Physical Exam: VS:  BP (!) 144/70 (BP  Location: Right Arm)   Pulse (!) 54   Ht 5' 10.5" (1.791 m)   Wt 202 lb (91.6 kg)   SpO2 95%   BMI 28.57 kg/m , BMI Body mass index is 28.57 kg/m.  Wt Readings from Last 3 Encounters:  10/30/18 202 lb (91.6 kg)  03/26/18 194 lb (88 kg)  02/10/18 185 lb (83.9 kg)    General: Elderly male, appears comfortable at rest. HEENT: Conjunctiva and lids normal, oropharynx clear. Neck: Supple, no elevated JVP or carotid bruits, no thyromegaly. Lungs: Clear to auscultation, nonlabored breathing at rest. Cardiac: Regular rate and rhythm, no S3 or significant systolic murmur. Abdomen: Soft, nontender, bowel sounds present. Extremities: No pitting edema, distal pulses 2+. Skin: Warm and dry. Musculoskeletal: No kyphosis. Neuropsychiatric: Alert and oriented x3, affect grossly appropriate.  ECG: I personally reviewed the tracing from 02/10/2018 which showed sinus rhythm.  Recent Labwork: 02/10/2018: BUN 19; Creatinine, Ser 0.79; Hemoglobin 14.9; Platelets 189; Potassium 3.9; Sodium 138     Component Value Date/Time   CHOL 123 01/15/2017 0702   TRIG 158 (H) 01/15/2017 0702   HDL 36 (L) 01/15/2017 0702   CHOLHDL 3.4 01/15/2017 0702   VLDL 32 01/15/2017 0702   LDLCALC 55 01/15/2017 0702    Other Studies Reviewed Today:  Cardiac catheterization and PCI 01/15/2017:  Mid LAD to Dist LAD lesion, 95 %stenosed.  Prox LAD to Mid LAD lesion, 75 %stenosed.  Post intervention, there is a 0% residual stenosis.  A stent was successfully placed.  1st Diag lesion, 75 %stenosed.  Post intervention, there is a 0% residual stenosis.  A stent was successfully placed.  IMPRESSION:Successful proximal and mid LAD PCI and stenting as well as mid first diagonal branch PCI and drug-eluting stenting using synergy drug-eluting stents. The sheath was removed and a TR band was placed on the right wrist to achieve patent hemostasis. The patient left the lab in stable condition. We will continue Angiomax with  bolus for 4 hours after which it'll be discontinued. The patient will most likely be able to be discharged tomorrow.  Echocardiogram 01/14/2017: Study Conclusions  - Left ventricle: The cavity size was normal. There was mild focal basal hypertrophy of the septum. Systolic function was normal. The estimated ejection fraction was in the range of 55% to 60%. Wall motion was normal; there were no regional wall motion abnormalities. Doppler parameters are consistent with abnormal left ventricular relaxation (grade 1 diastolic dysfunction). Doppler parameters are consistent with high ventricular filling pressure. - Aortic valve: There was trivial regurgitation. - Mitral valve: Calcified annulus. Mildly thickened leaflets . - Left atrium: The atrium was moderately dilated. - Atrial septum: There was increased thickness of the septum, consistent with lipomatous hypertrophy. - Pulmonary arteries: Systolic pressure was mildly increased. PA peak pressure: 32 mm Hg (S).  Impressions:  - Normal LV systolic function; grade 1 diastolic dysfunction with elevated LV filling pressure; trace AI; moderate LAE; mild TR with mildly elevated pulmonary pressure.  Lexiscan Myoview 06/06/2017:  There was no ST segment deviation noted during stress.  The study is normal. No myocardial ischemia or scar.  This is a low risk study.  Nuclear stress EF: 56%.  Assessment and Plan:  1.  CAD status post DES intervention to the proximal and mid LAD as well as diagonal in April 2018.  He has had some intermittent angina symptoms and we will continue medical therapy with addition of Imdur.  Follow-up scheduled for review.  If symptoms escalate we may need to discuss follow-up cardiac catheterization.  2.  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  He continues to follow with Dr. Sudie Bailey.  3.  Essential hypertension, blood pressure is mildly elevated today.  4.  Remote history of atrial  fibrillation without recurrence.  Current medicines were reviewed with the patient today.  Disposition: Follow-up in 6 weeks.   Signed, Jonelle Sidle, MD, [facility] 10/30/2018 1:15 PM    [facility] at [facility] [address] [place], [place] [address] Phone: [phone]; Fax: [phone]

## 2018-10-30 NOTE — Patient Instructions (Signed)
Medication Instructions:  Start Imdur 15 mg daily (1/2 tablet) - If after a few weeks you tolerate the 15 mg dose, you may move up to the 30 mg dose (1 tablet)  Labwork: none  Testing/Procedures: none  Follow-Up: Your physician recommends that you schedule a follow-up appointment in: 6 weeks    Any Other Special Instructions Will Be Listed Below (If Applicable).     If you need a refill on your cardiac medications before your next appointment, please call your pharmacy.

## 2018-12-20 ENCOUNTER — Ambulatory Visit: Payer: Medicare HMO | Admitting: Cardiology

## 2019-01-01 ENCOUNTER — Telehealth: Payer: Self-pay

## 2019-01-01 NOTE — Telephone Encounter (Signed)
Pt returned call. He states that he has been rescheduled several different times. I apologized for that but reminded him that this virus was out of our control. He does not want to do a phone visit at all. He stated that he saw Dr.Knowlton last week a he feels fine most days. He will call office if he gets any more problems that persist.

## 2019-01-01 NOTE — Telephone Encounter (Signed)
Called pt. Non answer. Left message for pt to return call.  

## 2019-01-08 ENCOUNTER — Ambulatory Visit: Payer: Medicare HMO | Admitting: Cardiology

## 2019-02-25 ENCOUNTER — Ambulatory Visit: Payer: Medicare HMO | Admitting: Cardiology

## 2019-04-01 ENCOUNTER — Telehealth: Payer: Self-pay | Admitting: Cardiology

## 2019-04-01 NOTE — Telephone Encounter (Signed)
I received a phone call from Dr. Karie Kirks today regarding Shawn Lopez.  I last assessed the patient back in February.  At that point we initiated isosorbide for treatment of recurring angina symptoms, unfortunately was not able to tolerate this.  He is still having progressive exertional angina on a regular basis and we will schedule an in office follow-up visit to discuss cardiac catheterization.

## 2019-04-02 ENCOUNTER — Other Ambulatory Visit (HOSPITAL_COMMUNITY)
Admission: RE | Admit: 2019-04-02 | Discharge: 2019-04-02 | Disposition: A | Discharge status: Home / Self Care | Payer: Medicare HMO | Source: Ambulatory Visit | Attending: Internal Medicine | Admitting: Internal Medicine

## 2019-04-02 ENCOUNTER — Encounter: Payer: Self-pay | Admitting: Cardiology

## 2019-04-02 ENCOUNTER — Ambulatory Visit (INDEPENDENT_AMBULATORY_CARE_PROVIDER_SITE_OTHER): Payer: Medicare HMO | Admitting: Cardiology

## 2019-04-02 ENCOUNTER — Other Ambulatory Visit: Payer: Self-pay | Admitting: Cardiology

## 2019-04-02 ENCOUNTER — Other Ambulatory Visit: Payer: Self-pay

## 2019-04-02 ENCOUNTER — Other Ambulatory Visit (HOSPITAL_COMMUNITY)
Admission: RE | Admit: 2019-04-02 | Discharge: 2019-04-02 | Disposition: A | Discharge status: Home / Self Care | Payer: Medicare HMO | Source: Ambulatory Visit | Attending: Cardiology | Admitting: Cardiology

## 2019-04-02 VITALS — BP 140/64 | HR 54 | Temp 98.9°F | Ht 71.0 in | Wt 197.0 lb

## 2019-04-02 DIAGNOSIS — Z1159 Encounter for screening for other viral diseases: Secondary | ICD-10-CM | POA: Diagnosis not present

## 2019-04-02 DIAGNOSIS — E782 Mixed hyperlipidemia: Secondary | ICD-10-CM

## 2019-04-02 DIAGNOSIS — I200 Unstable angina: Secondary | ICD-10-CM | POA: Diagnosis not present

## 2019-04-02 DIAGNOSIS — I25119 Atherosclerotic heart disease of native coronary artery with unspecified angina pectoris: Secondary | ICD-10-CM

## 2019-04-02 DIAGNOSIS — Z01818 Encounter for other preprocedural examination: Secondary | ICD-10-CM | POA: Insufficient documentation

## 2019-04-02 DIAGNOSIS — I10 Essential (primary) hypertension: Secondary | ICD-10-CM

## 2019-04-02 DIAGNOSIS — I25110 Atherosclerotic heart disease of native coronary artery with unstable angina pectoris: Secondary | ICD-10-CM | POA: Diagnosis not present

## 2019-04-02 DIAGNOSIS — Z01812 Encounter for preprocedural laboratory examination: Secondary | ICD-10-CM | POA: Diagnosis present

## 2019-04-02 LAB — BASIC METABOLIC PANEL
Anion gap: 8 (ref 5–15)
BUN: 15 mg/dL (ref 8–23)
CO2: 24 mmol/L (ref 22–32)
Calcium: 8.8 mg/dL — ABNORMAL LOW (ref 8.9–10.3)
Chloride: 103 mmol/L (ref 98–111)
Creatinine, Ser: 0.97 mg/dL (ref 0.61–1.24)
GFR calc Af Amer: >60 mL/min (ref >60)
GFR calc non Af Amer: >60 mL/min (ref >60)
Glucose, Bld: 225 mg/dL — ABNORMAL HIGH (ref 70–99)
Potassium: 3.6 mmol/L (ref 3.5–5.1)
Sodium: 135 mmol/L (ref 135–145)

## 2019-04-02 LAB — CBC WITH DIFFERENTIAL/PLATELET
Abs Immature Granulocytes: 0.01 10*3/uL (ref 0.00–0.07)
Basophils Absolute: 0 10*3/uL (ref 0.0–0.1)
Basophils Relative: 1 %
Eosinophils Absolute: 0.3 10*3/uL (ref 0.0–0.5)
Eosinophils Relative: 5 %
HCT: 42.4 % (ref 39.0–52.0)
Hemoglobin: 13.8 g/dL (ref 13.0–17.0)
Immature Granulocytes: 0 %
Lymphocytes Relative: 24 %
Lymphs Abs: 1.6 10*3/uL (ref 0.7–4.0)
MCH: 30.1 pg (ref 26.0–34.0)
MCHC: 32.5 g/dL (ref 30.0–36.0)
MCV: 92.6 fL (ref 80.0–100.0)
Monocytes Absolute: 0.6 10*3/uL (ref 0.1–1.0)
Monocytes Relative: 9 %
Neutro Abs: 4.2 10*3/uL (ref 1.7–7.7)
Neutrophils Relative %: 61 %
Platelets: 179 10*3/uL (ref 150–400)
RBC: 4.58 MIL/uL (ref 4.22–5.81)
RDW: 13.2 % (ref 11.5–15.5)
WBC: 6.7 10*3/uL (ref 4.0–10.5)
nRBC: 0 % (ref 0.0–0.2)

## 2019-04-02 LAB — SARS CORONAVIRUS 2 (TAT 6-24 HRS): SARS Coronavirus 2: NEGATIVE

## 2019-04-02 MED ORDER — SODIUM CHLORIDE 0.9% FLUSH: 3.0000 mL | Freq: Two times a day (BID) | INTRAVENOUS | Status: DC

## 2019-04-02 NOTE — H&P (View-Only) (Signed)
Cardiology Office Note  Date: 04/02/2019   ID: Shawn Lopez, DOB [birth year], MRN [id number]  PCP:  Shawn Lopez, [name], Lopez  Cardiologist:  Shawn DellSamuel McDowell, Lopez Electrophysiologist:  None   Chief Complaint  Patient presents with  . Coronary Artery Disease    History of Present Illness: Shawn NumberWilliam M Lopez is a 78 y.o. male last seen in February.  At the last visit we added Imdur given recurring angina symptoms.  His subsequent in-office follow-up was canceled in light of the coronavirus pandemic, although he was offered a virtual encounter which he declined.  He just recently saw Dr. Sudie Lopez in follow-up and reported worsening angina, office visit was scheduled with me today for further discussion.  He states that he could not tolerate the Imdur, took it for just under a week and stopped it, states that he felt worse, somewhat jittery.  He reports general lack of stamina and recurring left chest discomfort.  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  He has had less chest pain in the last week, but still lacks stamina.  We went over his medications today.  He states that his symptoms are similar to 2018.  I talked with him about follow-up testing including risks and benefits of a diagnostic cardiac catheterization.  He agrees to proceed.  Past Medical History:  Diagnosis Date  . Arthritis   . Atrial fibrillation (HCC) 2006   a. in 2006; not on anticoagulation, no recent atrial fib seen as of 2018.  . Borderline hypertension   . CAD (coronary artery disease)    a. NSTEMI 12/2016: cath showing 95% mid-LAD stenosis, 75% prox-LAD stenosis and 75% 1st Diag stenosis. DES to the proximal and mid-LAD along with the 1st Diagonal..  . Chronic back pain    Prior trauma with vertebral fracture  . Hyperlipidemia   . Hypertension   . Osteopenia   . Sinus bradycardia    a. metoprolol decreased 05/2017 due to this.  . Type 2 diabetes mellitus (HCC)     Past Surgical  History:  Procedure Laterality Date  . APPENDECTOMY  1960  . CATARACT EXTRACTION  2012   right eye,lens implantation  . CHOLECYSTECTOMY    . CIRCUMCISION  2010   meatotomy and dilatation  . COLONOSCOPY N/A 01/14/2013   Procedure: COLONOSCOPY;  Surgeon: Dalia HeadingMark A Jenkins, Lopez;  [facility];  Service: Gastroenterology;  Laterality: N/A;  . CORONARY ANGIOGRAPHY N/A 01/15/2017   Procedure: Coronary Angiography;  Surgeon: Runell GessJonathan J Berry, Lopez;  [facility];  Service: Cardiovascular;  Laterality: N/A;  . CORONARY STENT INTERVENTION N/A 01/15/2017   Procedure: Coronary Stent Intervention;  Surgeon: Runell GessJonathan J Berry, Lopez;  Location: [facility] INVASIVE CV LAB;  Service: Cardiovascular;  Laterality: N/A;    Current Outpatient Medications  Medication Sig Dispense Refill  . aspirin EC 81 MG EC tablet Take 1 tablet (81 mg total) by mouth daily. 30 tablet 0  . atorvastatin (LIPITOR) 80 MG tablet Take 1 tablet (80 mg total) by mouth daily at 6 PM. 90 tablet 3  . clopidogrel (PLAVIX) 75 MG tablet Take 1 tablet (75 mg total) by mouth daily. 90 tablet 3  . glimepiride (AMARYL) 1 MG tablet TK 1 T PO QAM FOR DIABETES    . HYDROcodone-acetaminophen (NORCO) 10-325 MG tablet Take 1 tablet by mouth 4 (four) times daily as needed.  0  . lisinopril (PRINIVIL,ZESTRIL) 5 MG tablet Take 1 tablet (5 mg total) by mouth daily. (  Patient taking differently: Take 2.5 mg by mouth daily. ) 90 tablet 3  . metoprolol tartrate (LOPRESSOR) 25 MG tablet Take 12.5 mg by mouth 2 (two) times daily.     . nitroGLYCERIN (NITROSTAT) 0.4 MG SL tablet Place 1 tablet (0.4 mg total) under the tongue every 5 (five) minutes as needed for chest pain. For 3 doses only 30 tablet 0  . isosorbide mononitrate (IMDUR) 30 MG 24 hr tablet Take 0.5 tablets (15 mg total) by mouth daily. 90 tablet 3   Current Facility-Administered Medications  Medication Dose Route Frequency Provider Last Rate Last Dose  . sodium chloride flush (NS) 0.9  % injection 3 mL  3 mL Intravenous Q12H Satira Sark, Lopez       Allergies:  Brilinta [ticagrelor]   Social History: The patient  reports that he quit smoking about 21 years ago. His smoking use included cigarettes. He has a 22.50 pack-year smoking history. He has never used smokeless tobacco. He reports that he does not drink alcohol or use drugs.   Family History: The patient's family history includes CAD in his father; Heart attack in his brother.   ROS:  Please see the history of present illness. Otherwise, complete review of systems is positive for none.  All other systems are reviewed and negative.   Physical Exam: VS:  BP 140/64 (BP Location: Left Arm)   Pulse (!) 54   Temp 98.9 F (37.2 C)   Ht 5\' 11"  (1.803 m)   Wt 197 lb (89.4 kg)   SpO2 97%   BMI 27.48 kg/m , BMI Body mass index is 27.48 kg/m.  Wt Readings from Last 3 Encounters:  04/02/19 197 lb (89.4 kg)  10/30/18 202 lb (91.6 kg)  03/26/18 194 lb (88 kg)    General: Elderly male, appears comfortable at rest. HEENT: Conjunctiva and lids normal, oropharynx clear. Neck: Supple, no elevated JVP or carotid bruits, no thyromegaly. Lungs: Clear to auscultation, nonlabored breathing at rest. Cardiac: Regular rate and rhythm, no S3 or significant systolic murmur. Abdomen: Soft, nontender, bowel sounds present. Extremities: No pitting edema, distal pulses 2+. Skin: Warm and dry. Musculoskeletal: No kyphosis. Neuropsychiatric: Alert and oriented x3, affect grossly appropriate.  ECG:  An ECG dated 02/10/2018 was personally reviewed today and demonstrated:  Sinus rhythm.  Recent Labwork:    Component Value Date/Time   CHOL 123 01/15/2017 0702   TRIG 158 (H) 01/15/2017 0702   HDL 36 (L) 01/15/2017 0702   CHOLHDL 3.4 01/15/2017 0702   VLDL 32 01/15/2017 0702   LDLCALC 55 01/15/2017 0702  02/10/2018: BUN 19; Creatinine, Ser 0.79; Hemoglobin 14.9; Platelets 189; Potassium 3.9; Sodium 138   Other Studies Reviewed  Today:  Cardiac catheterization and PCI 01/15/2017:  Mid LAD to Dist LAD lesion, 95 %stenosed.  Prox LAD to Mid LAD lesion, 75 %stenosed.  Post intervention, there is a 0% residual stenosis.  A stent was successfully placed.  1st Diag lesion, 75 %stenosed.  Post intervention, there is a 0% residual stenosis.  A stent was successfully placed.  IMPRESSION:Successful proximal and mid LAD PCI and stenting as well as mid first diagonal branch PCI and drug-eluting stenting using synergy drug-eluting stents. The sheath was removed and a TR band was placed on the right wrist to achieve patent hemostasis. The patient left the lab in stable condition. We will continue Angiomax with bolus for 4 hours after which it'll be discontinued. The patient will most likely be able to be discharged tomorrow.  Echocardiogram 01/14/2017: Study Conclusions  - Left ventricle: The cavity size was normal. There was mild focal basal hypertrophy of the septum. Systolic function was normal. The estimated ejection fraction was in the range of 55% to 60%. Wall motion was normal; there were no regional wall motion abnormalities. Doppler parameters are consistent with abnormal left ventricular relaxation (grade 1 diastolic dysfunction). Doppler parameters are consistent with high ventricular filling pressure. - Aortic valve: There was trivial regurgitation. - Mitral valve: Calcified annulus. Mildly thickened leaflets . - Left atrium: The atrium was moderately dilated. - Atrial septum: There was increased thickness of the septum, consistent with lipomatous hypertrophy. - Pulmonary arteries: Systolic pressure was mildly increased. PA peak pressure: 32 mm Hg (S).  Impressions:  - Normal LV systolic function; grade 1 diastolic dysfunction with elevated LV filling pressure; trace AI; moderate LAE; mild TR with mildly elevated pulmonary pressure.  Lexiscan Myoview 06/06/2017:  There  was no ST segment deviation noted during stress.  The study is normal. No myocardial ischemia or scar.  This is a low risk study.  Nuclear stress EF: 56%.  Assessment and Plan:  1.  CAD with history of DES intervention to the proximal and mid LAD as well as diagonal April 2018, now presenting with accelerating angina symptoms and decreased stamina.  He reports compliance with medical therapy, was not able to tolerate Imdur.  We have discussed the risks and benefits of a diagnostic cardiac catheterization to assess for revascularization options.  He is in agreement to proceed.  If he does not have significant revascularization options, I would consider stopping his Lopressor to make sure that that there is not a component of symptomatic bradycardia contributing, perhaps use Norvasc as additional antianginal medical therapy.  2.  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  He continues to follow with Dr. Knowlton.  Last LDL 55.  3.  Essential hypertension, no specific changes in medications today.  4.  Remote history of atrial fibrillation without recurrence.  He is not anticoagulated.  Medication Adjustments/Labs and Tests Ordered: Current medicines are reviewed at length with the patient today.  Concerns regarding medicines are outlined above.   Tests Ordered: Orders Placed This Encounter  Procedures  . CBC w/Diff/Platelet  . Basic Metabolic Panel (BMET)    Medication Changes: No orders of the defined types were placed in this encounter.   Disposition:  Follow up 1 month after procedure in the [place] office.  Signed, Shawn G. McDowell, Lopez, FACC 04/02/2019 1:09 PM    [facility] at [place] [address], [place], [place] [address] Phone: [phone]; Fax: [phone] 

## 2019-04-02 NOTE — Patient Instructions (Addendum)
    [place] [address] Dept: [phone] Loc: [place]  04/02/2019  You are scheduled for a Cardiac Catheterization on Friday, July 10 with Dr. Harrell Gave End.  1. Please arrive at the [facility] (Main Entrance A) at [facility]: [place], [place] [address] at 8:30 AM (This time is two hours before your procedure to ensure your preparation). Free valet parking service is available.   Special note: Every effort is made to have your procedure done on time. Please understand that emergencies sometimes delay scheduled procedures.  2. Diet: Do not eat solid foods after midnight.  The patient may have clear liquids until 5am upon the day of the procedure.  3. Labs: You will need to have blood drawn on TODAY at [place] go across the street from our office to have COVID-19 testing  4. Medication instructions in preparation for your procedure:   Contrast Allergy: No   HOLD Glimepiride the day of procedure and 48 hours after procedure  On the morning of your procedure, take your Aspirin 81 mg  and any morning medicines NOT listed above.  You may use sips of water.  5. Plan for one night stay--bring personal belongings. 6. Bring a current list of your medications and current insurance cards. 7. You MUST have a responsible person to drive you home. 8. Someone MUST be with you the first 24 hours after you arrive home or your discharge will be delayed. 9. Please wear clothes that are easy to get on and off and wear slip-on shoes.  Thank you for allowing [place] to care for you!   -- [facility]

## 2019-04-02 NOTE — Progress Notes (Signed)
Cardiology Office Note  Date: 04/02/2019   ID: Shawn Lopez, DOB [birth year], MRN [id number]  PCP:  Shawn Lopez, [name], MD  Cardiologist:  [name], MD Electrophysiologist:  None   Chief Complaint  Patient presents with  . Coronary Artery Disease    History of Present Illness: Shawn Lopez is a 78 y.o. male last seen in February.  At the last visit we added Imdur given recurring angina symptoms.  His subsequent in-office follow-up was canceled in light of the coronavirus pandemic, although he was offered a virtual encounter which he declined.  He just recently saw [name] in follow-up and reported worsening angina, office visit was scheduled with me today for further discussion.  He states that he could not tolerate the Imdur, took it for just under a week and stopped it, states that he felt worse, somewhat jittery.  He reports general lack of stamina and recurring left chest discomfort.  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  He has had less chest pain in the last week, but still lacks stamina.  We went over his medications today.  He states that his symptoms are similar to 2018.  I talked with him about follow-up testing including risks and benefits of a diagnostic cardiac catheterization.  He agrees to proceed.  Past Medical History:  Diagnosis Date  . Arthritis   . Atrial fibrillation (HCC) 2006   a. in 2006; not on anticoagulation, no recent atrial fib seen as of 2018.  . Borderline hypertension   . CAD (coronary artery disease)    a. NSTEMI 12/2016: cath showing 95% mid-LAD stenosis, 75% prox-LAD stenosis and 75% 1st Diag stenosis. DES to the proximal and mid-LAD along with the 1st Diagonal..  . Chronic back pain    Prior trauma with vertebral fracture  . Hyperlipidemia   . Hypertension   . Osteopenia   . Sinus bradycardia    a. metoprolol decreased 05/2017 due to this.  . Type 2 diabetes mellitus (HCC)     Past Surgical  History:  Procedure Laterality Date  . APPENDECTOMY  1960  . CATARACT EXTRACTION  2012   right eye,lens implantation  . CHOLECYSTECTOMY    . CIRCUMCISION  2010   meatotomy and dilatation  . COLONOSCOPY N/A 01/14/2013   Procedure: COLONOSCOPY;  Surgeon: Shawn HeadingMark A Jenkins, MD;  Location: [facility];  Service: Gastroenterology;  Laterality: N/A;  . CORONARY ANGIOGRAPHY N/A 01/15/2017   Procedure: Coronary Angiography;  Surgeon: Shawn GessJonathan J Berry, MD;  [facility];  Service: Cardiovascular;  Laterality: N/A;  . CORONARY STENT INTERVENTION N/A 01/15/2017   Procedure: Coronary Stent Intervention;  Surgeon: Shawn GessJonathan J Berry, MD;  Location: [facility] INVASIVE CV LAB;  Service: Cardiovascular;  Laterality: N/A;    Current Outpatient Medications  Medication Sig Dispense Refill  . aspirin EC 81 MG EC tablet Take 1 tablet (81 mg total) by mouth daily. 30 tablet 0  . atorvastatin (LIPITOR) 80 MG tablet Take 1 tablet (80 mg total) by mouth daily at 6 PM. 90 tablet 3  . clopidogrel (PLAVIX) 75 MG tablet Take 1 tablet (75 mg total) by mouth daily. 90 tablet 3  . glimepiride (AMARYL) 1 MG tablet TK 1 T PO QAM FOR DIABETES    . HYDROcodone-acetaminophen (NORCO) 10-325 MG tablet Take 1 tablet by mouth 4 (four) times daily as needed.  0  . lisinopril (PRINIVIL,ZESTRIL) 5 MG tablet Take 1 tablet (5 mg total) by mouth daily. (  Patient taking differently: Take 2.5 mg by mouth daily. ) 90 tablet 3  . metoprolol tartrate (LOPRESSOR) 25 MG tablet Take 12.5 mg by mouth 2 (two) times daily.     . nitroGLYCERIN (NITROSTAT) 0.4 MG SL tablet Place 1 tablet (0.4 mg total) under the tongue every 5 (five) minutes as needed for chest pain. For 3 doses only 30 tablet 0  . isosorbide mononitrate (IMDUR) 30 MG 24 hr tablet Take 0.5 tablets (15 mg total) by mouth daily. 90 tablet 3   Current Facility-Administered Medications  Medication Dose Route Frequency Provider Last Rate Last Dose  . sodium chloride flush (NS) 0.9  % injection 3 mL  3 mL Intravenous Q12H Satira Sark, MD       Allergies:  Brilinta [ticagrelor]   Social History: The patient  reports that he quit smoking about 21 years ago. His smoking use included cigarettes. He has a 22.50 pack-year smoking history. He has never used smokeless tobacco. He reports that he does not drink alcohol or use drugs.   Family History: The patient's family history includes CAD in his father; Heart attack in his brother.   ROS:  Please see the history of present illness. Otherwise, complete review of systems is positive for none.  All other systems are reviewed and negative.   Physical Exam: VS:  BP 140/64 (BP Location: Left Arm)   Pulse (!) 54   Temp 98.9 F (37.2 C)   Ht 5\' 11"  (1.803 m)   Wt 197 lb (89.4 kg)   SpO2 97%   BMI 27.48 kg/m , BMI Body mass index is 27.48 kg/m.  Wt Readings from Last 3 Encounters:  04/02/19 197 lb (89.4 kg)  10/30/18 202 lb (91.6 kg)  03/26/18 194 lb (88 kg)    General: Elderly male, appears comfortable at rest. HEENT: Conjunctiva and lids normal, oropharynx clear. Neck: Supple, no elevated JVP or carotid bruits, no thyromegaly. Lungs: Clear to auscultation, nonlabored breathing at rest. Cardiac: Regular rate and rhythm, no S3 or significant systolic murmur. Abdomen: Soft, nontender, bowel sounds present. Extremities: No pitting edema, distal pulses 2+. Skin: Warm and dry. Musculoskeletal: No kyphosis. Neuropsychiatric: Alert and oriented x3, affect grossly appropriate.  ECG:  An ECG dated 02/10/2018 was personally reviewed today and demonstrated:  Sinus rhythm.  Recent Labwork:    Component Value Date/Time   CHOL 123 01/15/2017 0702   TRIG 158 (H) 01/15/2017 0702   HDL 36 (L) 01/15/2017 0702   CHOLHDL 3.4 01/15/2017 0702   VLDL 32 01/15/2017 0702   LDLCALC 55 01/15/2017 0702  02/10/2018: BUN 19; Creatinine, Ser 0.79; Hemoglobin 14.9; Platelets 189; Potassium 3.9; Sodium 138   Other Studies Reviewed  Today:  Cardiac catheterization and PCI 01/15/2017:  Mid LAD to Dist LAD lesion, 95 %stenosed.  Prox LAD to Mid LAD lesion, 75 %stenosed.  Post intervention, there is a 0% residual stenosis.  A stent was successfully placed.  1st Diag lesion, 75 %stenosed.  Post intervention, there is a 0% residual stenosis.  A stent was successfully placed.  IMPRESSION:Successful proximal and mid LAD PCI and stenting as well as mid first diagonal branch PCI and drug-eluting stenting using synergy drug-eluting stents. The sheath was removed and a TR band was placed on the right wrist to achieve patent hemostasis. The patient left the lab in stable condition. We will continue Angiomax with bolus for 4 hours after which it'll be discontinued. The patient will most likely be able to be discharged tomorrow.  Echocardiogram 01/14/2017: Study Conclusions  - Left ventricle: The cavity size was normal. There was mild focal basal hypertrophy of the septum. Systolic function was normal. The estimated ejection fraction was in the range of 55% to 60%. Wall motion was normal; there were no regional wall motion abnormalities. Doppler parameters are consistent with abnormal left ventricular relaxation (grade 1 diastolic dysfunction). Doppler parameters are consistent with high ventricular filling pressure. - Aortic valve: There was trivial regurgitation. - Mitral valve: Calcified annulus. Mildly thickened leaflets . - Left atrium: The atrium was moderately dilated. - Atrial septum: There was increased thickness of the septum, consistent with lipomatous hypertrophy. - Pulmonary arteries: Systolic pressure was mildly increased. PA peak pressure: 32 mm Hg (S).  Impressions:  - Normal LV systolic function; grade 1 diastolic dysfunction with elevated LV filling pressure; trace AI; moderate LAE; mild TR with mildly elevated pulmonary pressure.  Lexiscan Myoview 06/06/2017:  There  was no ST segment deviation noted during stress.  The study is normal. No myocardial ischemia or scar.  This is a low risk study.  Nuclear stress EF: 56%.  Assessment and Plan:  1.  CAD with history of DES intervention to the proximal and mid LAD as well as diagonal April 2018, now presenting with accelerating angina symptoms and decreased stamina.  He reports compliance with medical therapy, was not able to tolerate Imdur.  We have discussed the risks and benefits of a diagnostic cardiac catheterization to assess for revascularization options.  He is in agreement to proceed.  If he does not have significant revascularization options, I would consider stopping his Lopressor to make sure that that there is not a component of symptomatic bradycardia contributing, perhaps use Norvasc as additional antianginal medical therapy.  2.  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  He continues to follow with [name].  Last LDL 55.  3.  Essential hypertension, no specific changes in medications today.  4.  Remote history of atrial fibrillation without recurrence.  He is not anticoagulated.  Medication Adjustments/Labs and Tests Ordered: Current medicines are reviewed at length with the patient today.  Concerns regarding medicines are outlined above.   Tests Ordered: Orders Placed This Encounter  Procedures  . CBC w/Diff/Platelet  . Basic Metabolic Panel (BMET)    Medication Changes: No orders of the defined types were placed in this encounter.   Disposition:  Follow up 1 month after procedure in the [facility] [place].  Signed, Jonelle SidleSamuel G. Jaleia Hanke, MD, [facility] 04/02/2019 1:09 PM    [facility] [address], [place], [place] [address] Phone: [phone]; Fax: [phone]

## 2019-04-03 ENCOUNTER — Telehealth: Payer: Self-pay

## 2019-04-03 NOTE — Telephone Encounter (Signed)
Reviewed the following pre-procedure instructions with the patient. Patient verbalized understanding and had no questions at this time.    Pt contacted pre-catheterization scheduled at [facility] for: 10:30am on 04/04/19 Verified arrival time and place: [facility] at: 8:30 am  Covid-19 test date: 04/02/19  No solid food after midnight prior to cath, clear liquids until 5 AM day of procedure. Contrast allergy: No Verified no diabetes medications. None  AM meds can be  taken pre-cath with sip of water including: ASA 81 mg  Patient is instructed to take his Plavix the morning of the procedure regardless of what his schedule is for the medication.  Patient is instructed to hold all diabetic medications the morning of the procedure  Confirmed patient has responsible person to drive home post procedure and observe 24 hours after arriving home: Patient has a responsible person to drive them home after the procedure and observe them for 24 hours after arriving home.      COVID-19 Pre-Screening Questions:  . In the past 7 to 10 days have you had a cough,  shortness of breath, headache, congestion, fever (100 or greater) body aches, chills, sore throat, or sudden loss of taste or sense of smell? No . Have you been around anyone with known Covid 19? No . Have you been around anyone who is awaiting Covid 19 test results in the past 7 to 10 days? No . Have you been around anyone who has been exposed to Covid 19, or has mentioned symptoms of Covid 19 within the past 7 to 10 days? No  If you have any concerns/questions about symptoms patients report during screening (either on the phone or at threshold). Contact the provider seeing the patient or DOD for further guidance.  If neither are available contact a member of the leadership team.

## 2019-04-04 ENCOUNTER — Other Ambulatory Visit: Payer: Self-pay

## 2019-04-04 ENCOUNTER — Encounter (HOSPITAL_COMMUNITY)
Admission: RE | Disposition: A | Discharge status: Home / Self Care | Payer: Self-pay | Source: Home / Self Care | Attending: Internal Medicine

## 2019-04-04 ENCOUNTER — Ambulatory Visit (HOSPITAL_COMMUNITY)
Admission: RE | Admit: 2019-04-04 | Discharge: 2019-04-04 | Disposition: A | Discharge status: Home / Self Care | Payer: Medicare HMO | Attending: Internal Medicine | Admitting: Internal Medicine

## 2019-04-04 DIAGNOSIS — R001 Bradycardia, unspecified: Secondary | ICD-10-CM | POA: Diagnosis not present

## 2019-04-04 DIAGNOSIS — Z7984 Long term (current) use of oral hypoglycemic drugs: Secondary | ICD-10-CM | POA: Diagnosis not present

## 2019-04-04 DIAGNOSIS — E782 Mixed hyperlipidemia: Secondary | ICD-10-CM | POA: Insufficient documentation

## 2019-04-04 DIAGNOSIS — I252 Old myocardial infarction: Secondary | ICD-10-CM | POA: Insufficient documentation

## 2019-04-04 DIAGNOSIS — Z79899 Other long term (current) drug therapy: Secondary | ICD-10-CM | POA: Insufficient documentation

## 2019-04-04 DIAGNOSIS — I25119 Atherosclerotic heart disease of native coronary artery with unspecified angina pectoris: Secondary | ICD-10-CM | POA: Diagnosis present

## 2019-04-04 DIAGNOSIS — E785 Hyperlipidemia, unspecified: Secondary | ICD-10-CM | POA: Insufficient documentation

## 2019-04-04 DIAGNOSIS — I200 Unstable angina: Secondary | ICD-10-CM | POA: Diagnosis present

## 2019-04-04 DIAGNOSIS — I25110 Atherosclerotic heart disease of native coronary artery with unstable angina pectoris: Secondary | ICD-10-CM

## 2019-04-04 DIAGNOSIS — E119 Type 2 diabetes mellitus without complications: Secondary | ICD-10-CM | POA: Insufficient documentation

## 2019-04-04 DIAGNOSIS — Z7982 Long term (current) use of aspirin: Secondary | ICD-10-CM | POA: Diagnosis not present

## 2019-04-04 DIAGNOSIS — Z955 Presence of coronary angioplasty implant and graft: Secondary | ICD-10-CM | POA: Diagnosis not present

## 2019-04-04 DIAGNOSIS — Z87891 Personal history of nicotine dependence: Secondary | ICD-10-CM | POA: Diagnosis not present

## 2019-04-04 DIAGNOSIS — I10 Essential (primary) hypertension: Secondary | ICD-10-CM | POA: Diagnosis not present

## 2019-04-04 DIAGNOSIS — G8929 Other chronic pain: Secondary | ICD-10-CM | POA: Diagnosis not present

## 2019-04-04 HISTORY — PX: INTRAVASCULAR PRESSURE WIRE/FFR STUDY: CATH118243

## 2019-04-04 HISTORY — PX: LEFT HEART CATH AND CORONARY ANGIOGRAPHY: CATH118249

## 2019-04-04 LAB — GLUCOSE, CAPILLARY: Glucose-Capillary: 132 mg/dL — ABNORMAL HIGH (ref 70–99)

## 2019-04-04 SURGERY — LEFT HEART CATH AND CORONARY ANGIOGRAPHY
Anesthesia: LOCAL

## 2019-04-04 MED ORDER — SODIUM CHLORIDE 0.9 % IV SOLN: 250.0000 mL | INTRAVENOUS | Status: DC | PRN

## 2019-04-04 MED ORDER — VERAPAMIL HCL 2.5 MG/ML IV SOLN
INTRAVENOUS | Status: DC | PRN
  Administered 2019-04-04: 10:00:00 10 mL via INTRA_ARTERIAL

## 2019-04-04 MED ORDER — LABETALOL HCL 5 MG/ML IV SOLN: 10.0000 mg | INTRAVENOUS | Status: DC | PRN

## 2019-04-04 MED ORDER — HEPARIN SODIUM (PORCINE) 1000 UNIT/ML IJ SOLN
INTRAMUSCULAR | Status: AC
  Filled 2019-04-04: qty 1

## 2019-04-04 MED ORDER — CLOPIDOGREL BISULFATE 75 MG PO TABS: 75.0000 mg | ORAL_TABLET | Freq: Every day | ORAL | Status: DC

## 2019-04-04 MED ORDER — ASPIRIN 81 MG PO CHEW: 81.0000 mg | CHEWABLE_TABLET | Freq: Every day | ORAL | Status: DC

## 2019-04-04 MED ORDER — FENTANYL CITRATE (PF) 100 MCG/2ML IJ SOLN
INTRAMUSCULAR | Status: AC
  Filled 2019-04-04: qty 2

## 2019-04-04 MED ORDER — HEPARIN (PORCINE) IN NACL 1000-0.9 UT/500ML-% IV SOLN
INTRAVENOUS | Status: DC | PRN
  Administered 2019-04-04 (×2): 500 mL

## 2019-04-04 MED ORDER — AMLODIPINE BESYLATE 2.5 MG PO TABS: 2.5000 mg | ORAL_TABLET | Freq: Every day | ORAL | 11 refills | Status: DC

## 2019-04-04 MED ORDER — HYDRALAZINE HCL 20 MG/ML IJ SOLN
10.0000 mg | INTRAMUSCULAR | Status: DC | PRN
  Administered 2019-04-04: 12:00:00 10 mg via INTRAVENOUS

## 2019-04-04 MED ORDER — SODIUM CHLORIDE 0.9 % IV SOLN: INTRAVENOUS | Status: DC

## 2019-04-04 MED ORDER — LIDOCAINE HCL (PF) 1 % IJ SOLN
INTRAMUSCULAR | Status: AC
  Filled 2019-04-04: qty 30

## 2019-04-04 MED ORDER — MIDAZOLAM HCL 2 MG/2ML IJ SOLN
INTRAMUSCULAR | Status: DC | PRN
  Administered 2019-04-04: 1 mg via INTRAVENOUS

## 2019-04-04 MED ORDER — HYDRALAZINE HCL 20 MG/ML IJ SOLN
INTRAMUSCULAR | Status: AC
  Administered 2019-04-04: 10 mg via INTRAVENOUS
  Filled 2019-04-04: qty 1

## 2019-04-04 MED ORDER — METOPROLOL TARTRATE 25 MG PO TABS: 12.5000 mg | ORAL_TABLET | Freq: Two times a day (BID) | ORAL | Status: DC

## 2019-04-04 MED ORDER — SODIUM CHLORIDE 0.9% FLUSH: 3.0000 mL | INTRAVENOUS | Status: DC | PRN

## 2019-04-04 MED ORDER — HEPARIN SODIUM (PORCINE) 1000 UNIT/ML IJ SOLN
INTRAMUSCULAR | Status: DC | PRN
  Administered 2019-04-04 (×2): 4500 [IU] via INTRAVENOUS

## 2019-04-04 MED ORDER — MIDAZOLAM HCL 2 MG/2ML IJ SOLN
INTRAMUSCULAR | Status: AC
  Filled 2019-04-04: qty 2

## 2019-04-04 MED ORDER — LIDOCAINE HCL (PF) 1 % IJ SOLN
INTRAMUSCULAR | Status: DC | PRN
  Administered 2019-04-04: 2 mL

## 2019-04-04 MED ORDER — ASPIRIN 81 MG PO CHEW: 81.0000 mg | CHEWABLE_TABLET | ORAL | Status: DC

## 2019-04-04 MED ORDER — SODIUM CHLORIDE 0.9% FLUSH: 3.0000 mL | Freq: Two times a day (BID) | INTRAVENOUS | Status: DC

## 2019-04-04 MED ORDER — ONDANSETRON HCL 4 MG/2ML IJ SOLN: 4.0000 mg | Freq: Four times a day (QID) | INTRAMUSCULAR | Status: DC | PRN

## 2019-04-04 MED ORDER — SODIUM CHLORIDE 0.9 % WEIGHT BASED INFUSION
3.0000 mL/kg/h | INTRAVENOUS | Status: AC
  Administered 2019-04-04: 3 mL/kg/h via INTRAVENOUS

## 2019-04-04 MED ORDER — SODIUM CHLORIDE 0.9 % WEIGHT BASED INFUSION: 1.0000 mL/kg/h | INTRAVENOUS | Status: DC

## 2019-04-04 MED ORDER — FENTANYL CITRATE (PF) 100 MCG/2ML IJ SOLN
INTRAMUSCULAR | Status: DC | PRN
  Administered 2019-04-04: 50 ug via INTRAVENOUS

## 2019-04-04 MED ORDER — ACETAMINOPHEN 325 MG PO TABS: 650.0000 mg | ORAL_TABLET | ORAL | Status: DC | PRN

## 2019-04-04 MED ORDER — HEPARIN (PORCINE) IN NACL 1000-0.9 UT/500ML-% IV SOLN
INTRAVENOUS | Status: AC
  Filled 2019-04-04: qty 1000

## 2019-04-04 SURGICAL SUPPLY — 13 items
CATH 5FR JL3.5 JR4 ANG PIG MP (CATHETERS) ×2 IMPLANT
CATH LAUNCHER 6FR EBU3.5 (CATHETERS) ×2 IMPLANT
COVER DOME SNAP 22 D (MISCELLANEOUS) ×2 IMPLANT
DEVICE RAD COMP TR BAND LRG (VASCULAR PRODUCTS) ×2 IMPLANT
GLIDESHEATH SLEND SS 6F .021 (SHEATH) ×2 IMPLANT
GUIDEWIRE INQWIRE 1.5J.035X260 (WIRE) ×1 IMPLANT
GUIDEWIRE PRESSURE COMET II (WIRE) ×2 IMPLANT
INQWIRE 1.5J .035X260CM (WIRE) ×2
KIT ENCORE 26 ADVANTAGE (KITS) ×2 IMPLANT
KIT HEART LEFT (KITS) ×2 IMPLANT
PACK CARDIAC CATHETERIZATION (CUSTOM PROCEDURE TRAY) ×2 IMPLANT
TRANSDUCER W/STOPCOCK (MISCELLANEOUS) ×2 IMPLANT
TUBING CIL FLEX 10 FLL-RA (TUBING) ×2 IMPLANT

## 2019-04-04 NOTE — Progress Notes (Signed)
D/c instructions reviewed with wife Bethena Roys via telephone due to [place] restrictions.  All questions answered and Bethena Roys verbalized understanding

## 2019-04-04 NOTE — Discharge Instructions (Signed)
Radial Site Care ° °This sheet gives you information about how to care for yourself after your procedure. Your health care provider may also give you more specific instructions. If you have problems or questions, contact your health care provider. °What can I expect after the procedure? °After the procedure, it is common to have: °· Bruising and tenderness at the catheter insertion area. °Follow these instructions at home: °Medicines °· Take over-the-counter and prescription medicines only as told by your health care provider. °Insertion site care °· Follow instructions from your health care provider about how to take care of your insertion site. Make sure you: °? Wash your hands with soap and water before you change your bandage (dressing). If soap and water are not available, use hand sanitizer. °? Change your dressing as told by your health care provider. °? Leave stitches (sutures), skin glue, or adhesive strips in place. These skin closures may need to stay in place for 2 weeks or longer. If adhesive strip edges start to loosen and curl up, you may trim the loose edges. Do not remove adhesive strips completely unless your health care provider tells you to do that. °· Check your insertion site every day for signs of infection. Check for: °? Redness, swelling, or pain. °? Fluid or blood. °? Pus or a bad smell. °? Warmth. °· Do not take baths, swim, or use a hot tub until your health care provider approves. °· You may shower 24-48 hours after the procedure, or as directed by your health care provider. °? Remove the dressing and gently wash the site with plain soap and water. °? Pat the area dry with a clean towel. °? Do not rub the site. That could cause bleeding. °· Do not apply powder or lotion to the site. °Activity ° °· For 24 hours after the procedure, or as directed by your health care provider: °? Do not flex or bend the affected arm. °? Do not push or pull heavy objects with the affected arm. °? Do not  drive yourself home from the hospital or clinic. You may drive 24 hours after the procedure unless your health care provider tells you not to. °? Do not operate machinery or power tools. °· Do not lift anything that is heavier than 10 lb (4.5 kg), or the limit that you are told, until your health care provider says that it is safe. °· Ask your health care provider when it is okay to: °? Return to work or school. °? Resume usual physical activities or sports. °? Resume sexual activity. °General instructions °· If the catheter site starts to bleed, raise your arm and put firm pressure on the site. If the bleeding does not stop, get help right away. This is a medical emergency. °· If you went home on the same day as your procedure, a responsible adult should be with you for the first 24 hours after you arrive home. °· Keep all follow-up visits as told by your health care provider. This is important. °Contact a health care provider if: °· You have a fever. °· You have redness, swelling, or yellow drainage around your insertion site. °Get help right away if: °· You have unusual pain at the radial site. °· The catheter insertion area swells very fast. °· The insertion area is bleeding, and the bleeding does not stop when you hold steady pressure on the area. °· Your arm or hand becomes pale, cool, tingly, or numb. °These symptoms may represent a serious problem   that is an emergency. Do not wait to see if the symptoms will go away. Get medical help right away. Call your local emergency services (911 in the [place] Do not drive yourself to the hospital. °Summary °· After the procedure, it is common to have bruising and tenderness at the site. °· Follow instructions from your health care provider about how to take care of your radial site wound. Check the wound every day for signs of infection. °· Do not lift anything that is heavier than 10 lb (4.5 kg), or the limit that you are told, until your health care provider says  that it is safe. °This information is not intended to replace advice given to you by your health care provider. Make sure you discuss any questions you have with your health care provider. °Document Released: 10/14/2010 Document Revised: 10/17/2017 Document Reviewed: 10/17/2017 °Elsevier Patient Education © 2020 [employer] ° °

## 2019-04-04 NOTE — Interval H&P Note (Signed)
History and Physical Interval Note:  04/04/2019 9:48 AM  Shawn Lopez  has presented today for cardiac catheterization, with the diagnosis of accelerating angina.  The various methods of treatment have been discussed with the patient and family. After consideration of risks, benefits and other options for treatment, the patient has consented to  Procedure(s): LEFT HEART CATH AND CORONARY ANGIOGRAPHY (N/A) as a surgical intervention.  The patient's history has been reviewed, patient examined, no change in status, stable for surgery.  I have reviewed the patient's chart and labs.  Questions were answered to the patient's satisfaction.    Cath Lab Visit (complete for each Cath Lab visit)  Clinical Evaluation Leading to the Procedure:   ACS: No.  Non-ACS:    Anginal Classification: CCS III  Anti-ischemic medical therapy: Maximal Therapy (2 or more classes of medications)  Non-Invasive Test Results: No non-invasive testing performed  Prior CABG: No previous CABG  Lennix Kneisel

## 2019-04-07 ENCOUNTER — Encounter (HOSPITAL_COMMUNITY): Payer: Self-pay | Admitting: Internal Medicine

## 2019-04-07 LAB — POCT ACTIVATED CLOTTING TIME: Activated Clotting Time: 279 seconds

## 2019-05-12 ENCOUNTER — Ambulatory Visit (INDEPENDENT_AMBULATORY_CARE_PROVIDER_SITE_OTHER): Payer: Medicare HMO | Admitting: Cardiology

## 2019-05-12 ENCOUNTER — Encounter: Payer: Self-pay | Admitting: Cardiology

## 2019-05-12 ENCOUNTER — Other Ambulatory Visit: Payer: Self-pay

## 2019-05-12 VITALS — BP 135/68 | HR 47 | Temp 97.8°F | Ht 70.0 in | Wt 198.0 lb

## 2019-05-12 DIAGNOSIS — I25119 Atherosclerotic heart disease of native coronary artery with unspecified angina pectoris: Secondary | ICD-10-CM | POA: Diagnosis not present

## 2019-05-12 DIAGNOSIS — R001 Bradycardia, unspecified: Secondary | ICD-10-CM

## 2019-05-12 DIAGNOSIS — I10 Essential (primary) hypertension: Secondary | ICD-10-CM

## 2019-05-12 DIAGNOSIS — E782 Mixed hyperlipidemia: Secondary | ICD-10-CM

## 2019-05-12 NOTE — Progress Notes (Signed)
Cardiology Office Note  Date: 05/12/2019   ID: Shawn, Lopez [birth year], MRN [id number]  PCP:  Lemmie Evens, MD  Cardiologist:  Rozann Lesches, MD Electrophysiologist:  None   Chief Complaint  Patient presents with  . Cardiac follow-up    History of Present Illness: Shawn Lopez is a 78 y.o. male last seen in July.  He was referred at that time with increasing angina symptoms on medical therapy for a follow-up diagnostic cardiac catheterization.  Procedure was performed by Dr. Saunders Revel on July 10 revealing mild to moderate nonobstructive CAD with patent overlapping stents within the LAD, 50% in-stent restenosis with negative DFR, patent stent within the first diagonal as well.  Medical therapy was recommended.  He presents today stating that he is doing reasonably well on low-dose Norvasc.  I talked with him about stopping Lopressor completely given his bradycardia to avoid chronotropic incompetence as a contributor to his symptoms as well.  Past Medical History:  Diagnosis Date  . Arthritis   . Atrial fibrillation ([place]) 2006   a. in 2006; not on anticoagulation, no recent atrial fib seen as of 2018.  . Borderline hypertension   . CAD (coronary artery disease)    a. NSTEMI 12/2016: cath showing 95% mid-LAD stenosis, 75% prox-LAD stenosis and 75% 1st Diag stenosis. DES to the proximal and mid-LAD along with the 1st Diagonal..  . Chronic back pain    Prior trauma with vertebral fracture  . Hyperlipidemia   . Hypertension   . Osteopenia   . Sinus bradycardia    a. metoprolol decreased 05/2017 due to this.  . Type 2 diabetes mellitus ([place])     Past Surgical History:  Procedure Laterality Date  . APPENDECTOMY  1960  . CATARACT EXTRACTION  2012   right eye,lens implantation  . CHOLECYSTECTOMY    . CIRCUMCISION  2010   meatotomy and dilatation  . COLONOSCOPY N/A 01/14/2013   Procedure: COLONOSCOPY;  Surgeon: Jamesetta So, MD;  [facility];   Service: Gastroenterology;  Laterality: N/A;  . CORONARY ANGIOGRAPHY N/A 01/15/2017   Procedure: Coronary Angiography;  Surgeon: Lorretta Harp, MD;  Location: [place] [facility];  Service: Cardiovascular;  Laterality: N/A;  . CORONARY STENT INTERVENTION N/A 01/15/2017   Procedure: Coronary Stent Intervention;  Surgeon: Lorretta Harp, MD;  Location: [place] [facility];  Service: Cardiovascular;  Laterality: N/A;  . INTRAVASCULAR PRESSURE WIRE/FFR STUDY N/A 04/04/2019   Procedure: INTRAVASCULAR PRESSURE WIRE/FFR STUDY;  Surgeon: Nelva Bush, MD;  Location: [place] [facility];  Service: Cardiovascular;  Laterality: N/A;  . LEFT HEART CATH AND CORONARY ANGIOGRAPHY N/A 04/04/2019   Procedure: LEFT HEART CATH AND CORONARY ANGIOGRAPHY;  Surgeon: Nelva Bush, MD;  Location: [place] [facility];  Service: Cardiovascular;  Laterality: N/A;    Current Outpatient Medications  Medication Sig Dispense Refill  . amLODipine (NORVASC) 2.5 MG tablet Take 1 tablet (2.5 mg total) by mouth daily. 30 tablet 11  . aspirin EC 81 MG EC tablet Take 1 tablet (81 mg total) by mouth daily. 30 tablet 0  . atorvastatin (LIPITOR) 80 MG tablet Take 1 tablet (80 mg total) by mouth daily at 6 PM. 90 tablet 3  . clopidogrel (PLAVIX) 75 MG tablet Take 1 tablet (75 mg total) by mouth daily. 90 tablet 3  . glimepiride (AMARYL) 1 MG tablet Take 1 mg by mouth daily with breakfast.     . HYDROcodone-acetaminophen (NORCO) 10-325 MG tablet Take 1 tablet  by mouth every 6 (six) hours as needed for moderate pain.   0  . lisinopril (PRINIVIL,ZESTRIL) 5 MG tablet Take 1 tablet (5 mg total) by mouth daily. 90 tablet 3  . nitroGLYCERIN (NITROSTAT) 0.4 MG SL tablet Place 1 tablet (0.4 mg total) under the tongue every 5 (five) minutes as needed for chest pain. For 3 doses only 30 tablet 0   No current facility-administered medications for this visit.    Allergies:  Brilinta [ticagrelor]   Social History: The patient  reports that  he quit smoking about 22 years ago. His smoking use included cigarettes. He has a 22.50 pack-year smoking history. He has never used smokeless tobacco. He reports that he does not drink alcohol or use drugs.   ROS:  Please see the history of present illness. 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  All other systems are reviewed and negative.   Physical Exam: VS:  BP 135/68   Pulse (!) 47   Temp 97.8 F (36.6 C)   Ht 5\' 10"  (1.778 m)   Wt 198 lb (89.8 kg)   BMI 28.41 kg/m , BMI Body mass index is 28.41 kg/m.  Wt Readings from Last 3 Encounters:  05/12/19 198 lb (89.8 kg)  04/04/19 197 lb (89.4 kg)  04/02/19 197 lb (89.4 kg)    General: Elderly male, appears comfortable at rest. HEENT: Conjunctiva and lids normal, wearing a mask. Neck: Supple, no elevated JVP or carotid bruits, no thyromegaly. Lungs: Clear to auscultation, nonlabored breathing at rest. Cardiac: Regular rate and rhythm, no S3 or significant systolic murmur. Abdomen: Soft, nontender, bowel sounds present. Extremities: No pitting edema, distal pulses 2+. Skin: Warm and dry. Musculoskeletal: No kyphosis. Neuropsychiatric: Alert and oriented x3, affect grossly appropriate.  ECG:  An ECG dated 04/04/2019 was personally reviewed today and demonstrated:  Sinus bradycardia at 43 bpm.  Recent Labwork: 04/02/2019: BUN 15; Creatinine, Ser 0.97; Hemoglobin 13.8; Platelets 179; Potassium 3.6; Sodium 135     Component Value Date/Time   CHOL 123 01/15/2017 0702   TRIG 158 (H) 01/15/2017 0702   HDL 36 (L) 01/15/2017 0702   CHOLHDL 3.4 01/15/2017 0702   VLDL 32 01/15/2017 0702   LDLCALC 55 01/15/2017 0702    Other Studies Reviewed Today:  Cardiac catheterization 04/04/2019: Conclusions: 1. Mild to moderate, non-obstructive coronary artery disease, as detailed below.  Long segment of three overlapping stents in the proximal through distal LAD is patent with up to 50% in-stent restenosis in the mid  section that is not hemodynamically significant at this time with a DFR of 0.90-0.91 (cut point for significance is 0.89). 2. Widely patent stent in D1 with 50% focal disease proximal to the stent, which is not hemodynamically significant (DFR 0.95). 3. Normal left ventricular systolic function with mildly elevated filling pressure suggestive of diastolic dysfunction. 4. Sinus bradycardia with heart rates of 40-50 bpm noted throughout most of the case.  Recommendations: 1. Continue aggressive medical therapy, including indefinite dual antiplatelet therapy with aspirin and clopidogrel given long stented segment of the LAD. 2. Question if chronotropic incompetence is contributing to symptoms; I will decrease metoprolol tartrate to 12.5 twice daily. 3. Add amlodipine 2.5 mg daily for improved blood pressure control and antianginal therapy, as [name] has been intolerant of isosorbide mononitrate.  Echocardiogram 01/14/2017: Study Conclusions  - Left ventricle: The cavity size was normal. There was mild focal   basal hypertrophy of the septum. Systolic function was normal.   The  estimated ejection fraction was in the range of 55% to 60%.   Wall motion was normal; there were no regional wall motion   abnormalities. Doppler parameters are consistent with abnormal   left ventricular relaxation (grade 1 diastolic dysfunction).   Doppler parameters are consistent with high ventricular filling   pressure. - Aortic valve: There was trivial regurgitation. - Mitral valve: Calcified annulus. Mildly thickened leaflets . - Left atrium: The atrium was moderately dilated. - Atrial septum: There was increased thickness of the septum,   consistent with lipomatous hypertrophy. - Pulmonary arteries: Systolic pressure was mildly increased. PA   peak pressure: 32 mm Hg (S).  Impressions:  - Normal LV systolic function; grade 1 diastolic dysfunction with   elevated LV filling pressure; trace AI;  moderate LAE; mild TR   with mildly elevated pulmonary pressure.  Assessment and Plan:  1.  CAD with history of DES intervention to the proximal and mid LAD as well as diagonal in April 2018, found to be patent by recent cardiac catheterization.  Degree of in-stent stent restenosis was not found to be hemodynamically significant by DFR.  Plan is to continue medical therapy, he is tolerating low-dose Norvasc.  Would also stop Lopressor completely in light of bradycardia to reduce contributor of chronotropic incompetence.  2.  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  Keep follow-up with [name].  Last LDL 55.  3.  Resting bradycardia.  Lopressor being stopped completely as outlined above.  4.  Essential hypertension, systolic is in the 130s today.  Continue with current plan.  Medication Adjustments/Labs and Tests Ordered: Current medicines are reviewed at length with the patient today.  Concerns regarding medicines are outlined above.   Tests Ordered: No orders of the defined types were placed in this encounter.   Medication Changes: No orders of the defined types were placed in this encounter.   Disposition:  Follow up 6 months in the [place] office.  Signed, Jonelle SidleSamuel G. McDowell, MD, [facility] 05/12/2019 3:11 PM    [facility] [address], [place], [place] [address] Phone: [phone]) [phone]; Fax: [phone]) [phone]

## 2019-05-12 NOTE — Patient Instructions (Signed)
Medication Instructions: STOP Lopressor  Labwork: None  Procedures/Testing: None  Follow-Up: 6 months with Dr.McDowell  Any Additional Special Instructions Will Be Listed Below (If Applicable).     If you need a refill on your cardiac medications before your next appointment, please call your pharmacy.    Thank you for choosing [facility] !

## 2019-05-16 IMAGING — DX DG CHEST 2V
2 series · 2 of 2 positions shown · non-contrast
Comparison: 01/13/2017, 12/13/2012 and earlier, including CT chest
03/17/2011.

CLINICAL DATA: Acute onset of chest pain that awakened the patient
at approximately 4 o'clock a.m. this morning. Current history of
coronary artery disease with prior stenting.

EXAM:
CHEST  2 VIEW

[chest pa]
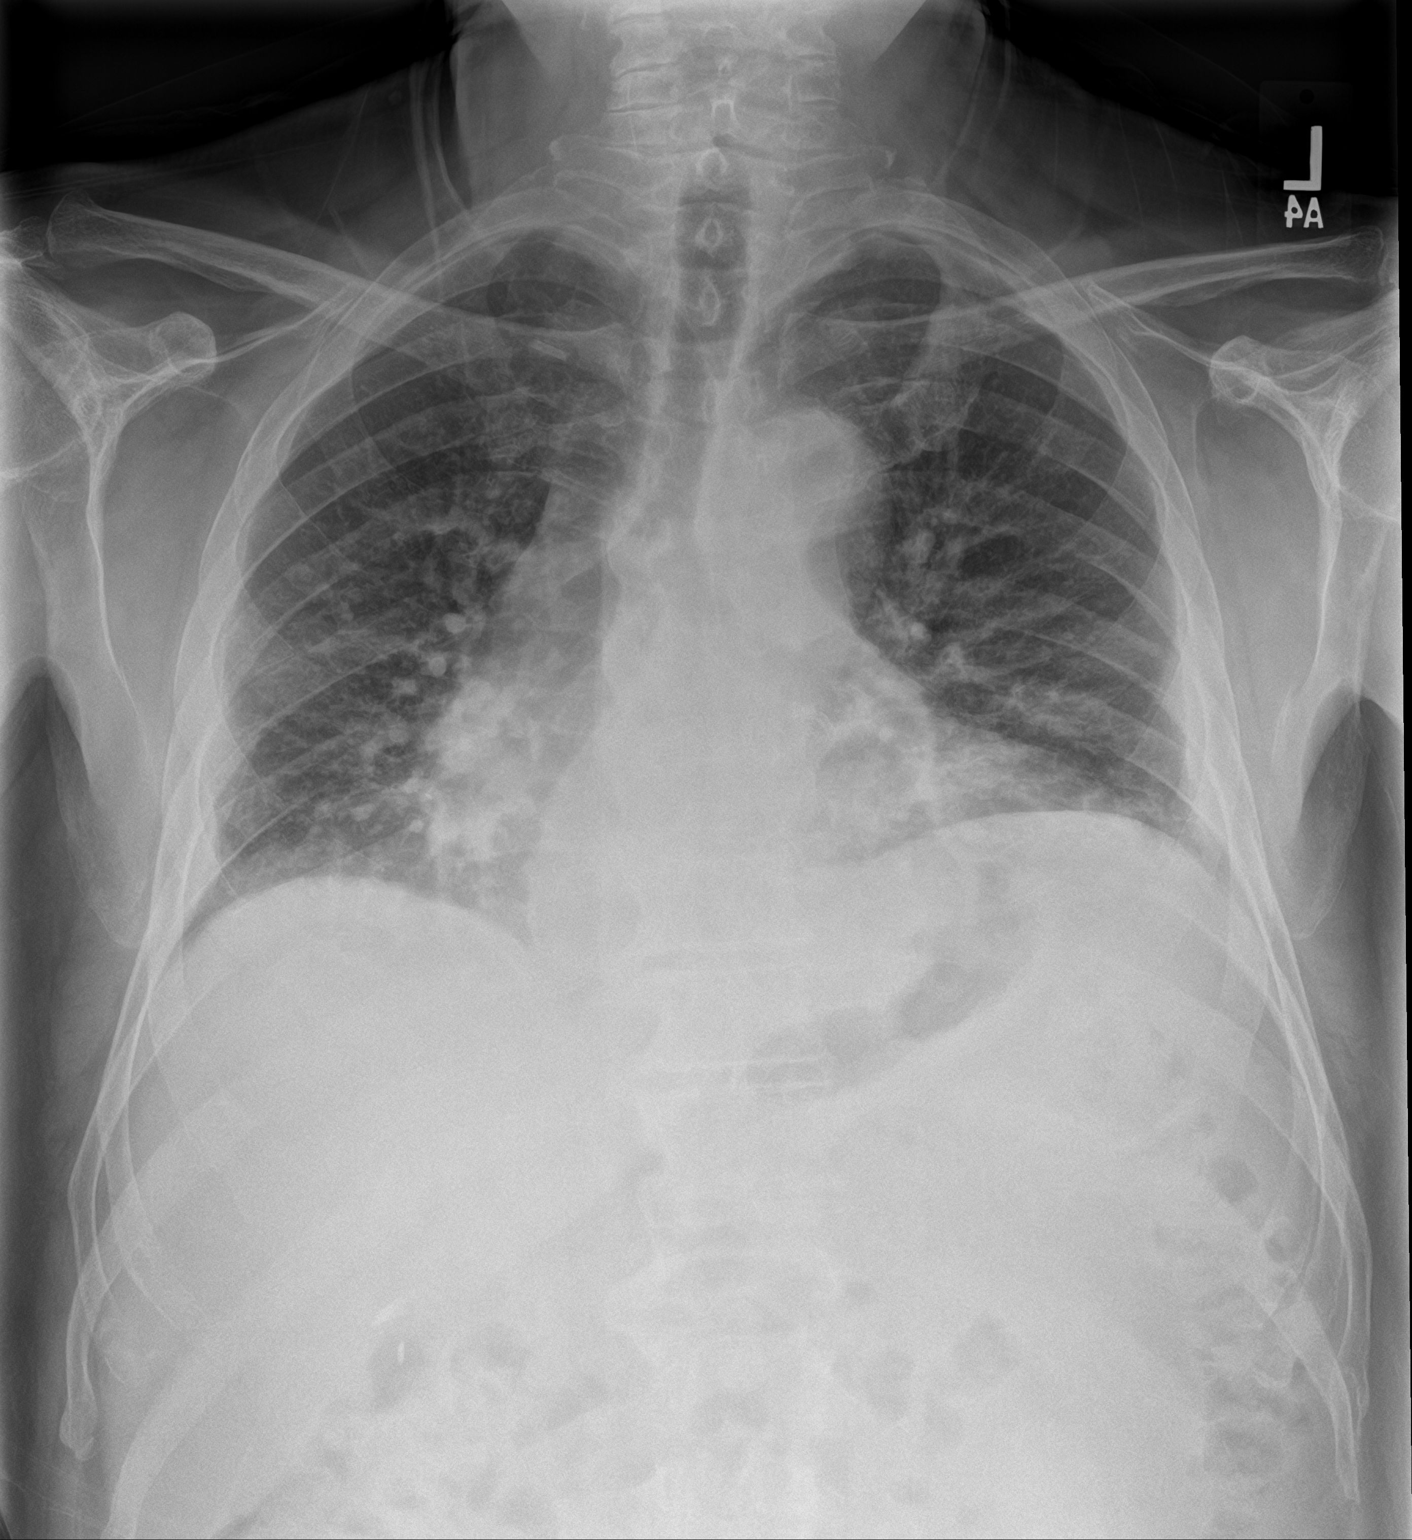

[chest lat]
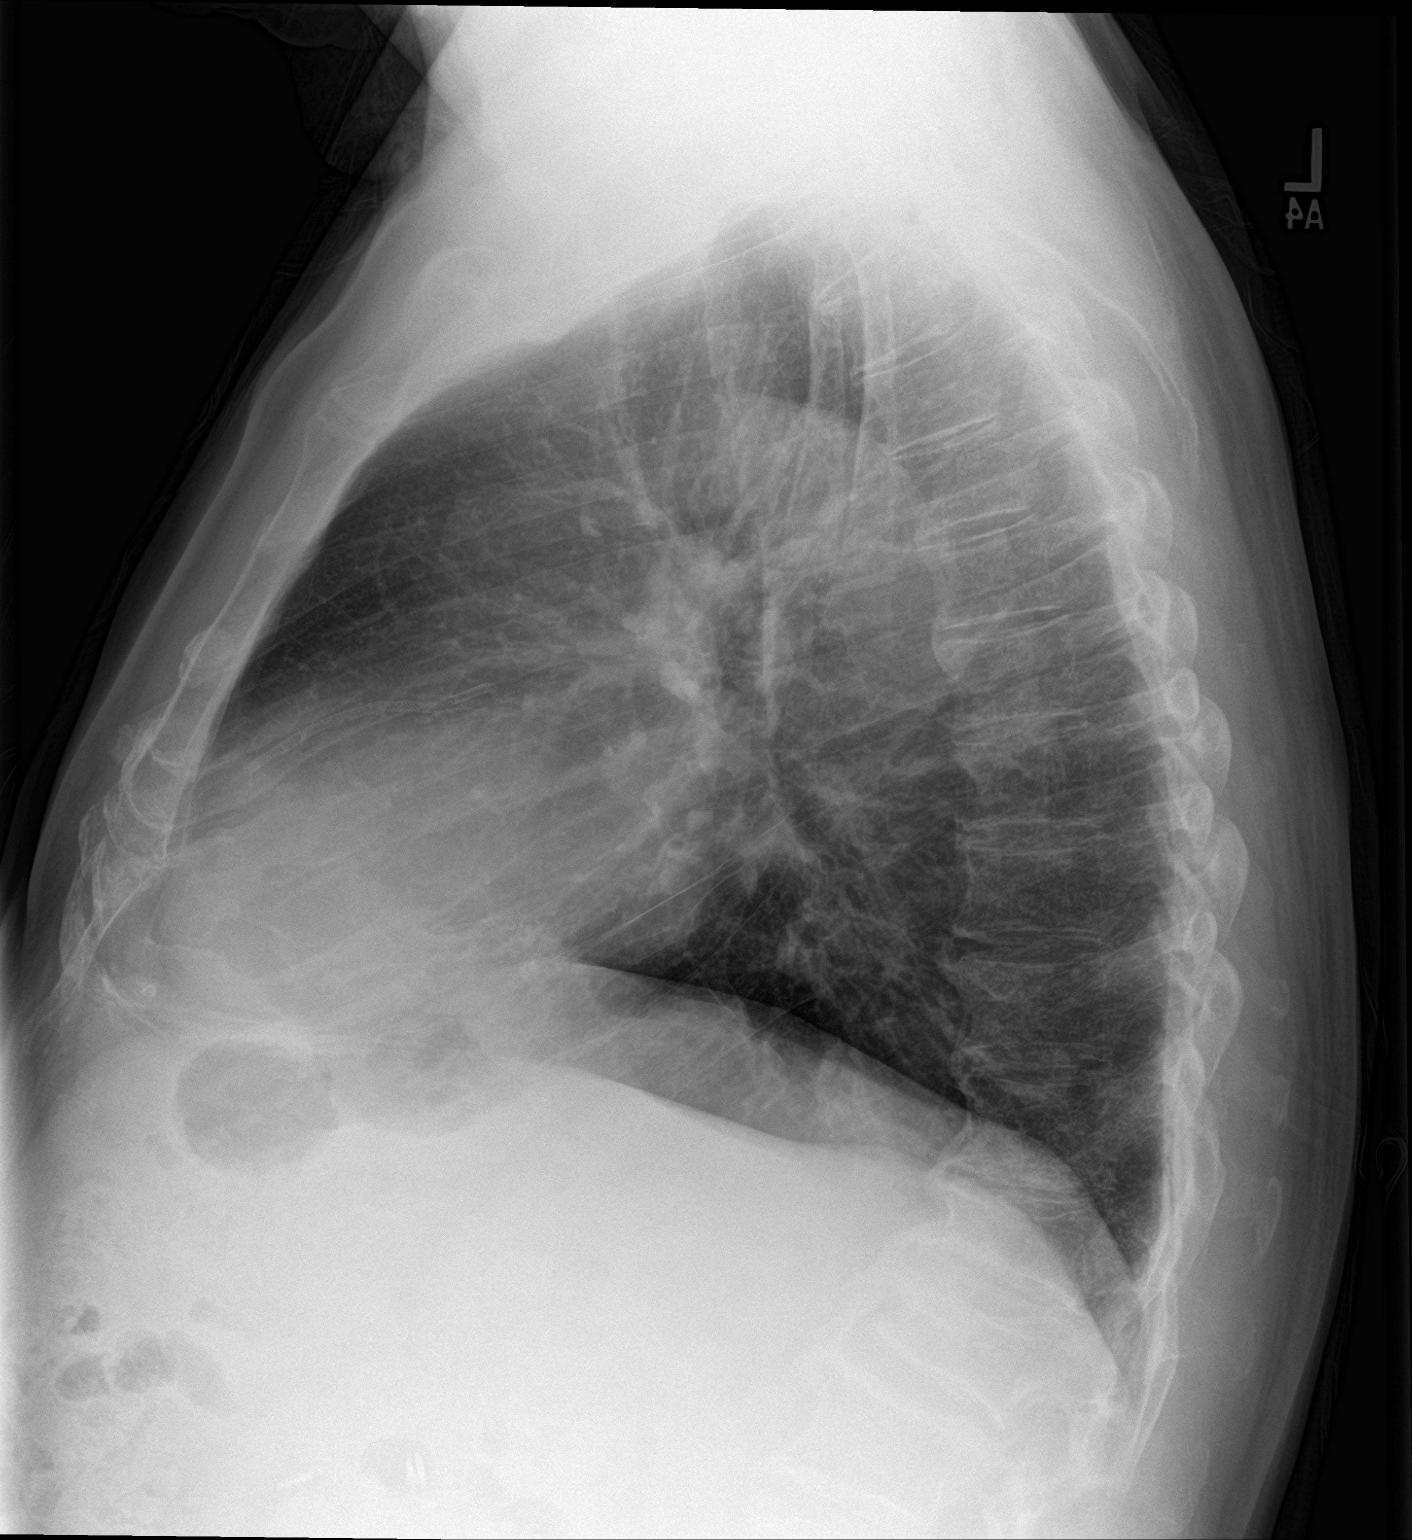

[2 of 2 positions shown; findings below may reference images not displayed]

FINDINGS: Suboptimal inspiration accounts for mild bibasilar atelectasis, left
greater than right, and accentuates the cardiac silhouette. Taking
this into account, cardiac silhouette moderately enlarged,
unchanged. Thoracic aorta tortuous and atherosclerotic, unchanged.
LAD coronary stent noted. Hilar and mediastinal contours otherwise
unremarkable.

Lungs otherwise clear. Pulmonary vascularity normal. Moderate
central peribronchial thickening, similar to the prior examinations.
No pleural effusions. Degenerative changes and DISH involving the
thoracic spine. Remote fractures involving T12 and L1, unchanged.
IMPRESSION: 1. Suboptimal inspiration accounts for mild bibasilar atelectasis.
No acute cardiopulmonary disease otherwise.
2. Moderate changes of chronic bronchitis and/or asthma without
focal pneumonia.
3. Stable cardiomegaly without pulmonary edema.
4.  Aortic Atherosclerosis ([id number])

## 2019-06-11 ENCOUNTER — Emergency Department (HOSPITAL_COMMUNITY): Payer: Medicare HMO

## 2019-06-11 ENCOUNTER — Emergency Department (HOSPITAL_COMMUNITY)
Admission: EM | Admit: 2019-06-11 | Discharge: 2019-06-11 | Disposition: A | Discharge status: Home / Self Care | Payer: Medicare HMO | Attending: Emergency Medicine | Admitting: Emergency Medicine

## 2019-06-11 ENCOUNTER — Other Ambulatory Visit: Payer: Self-pay

## 2019-06-11 ENCOUNTER — Encounter (HOSPITAL_COMMUNITY): Payer: Self-pay

## 2019-06-11 DIAGNOSIS — Z87891 Personal history of nicotine dependence: Secondary | ICD-10-CM | POA: Insufficient documentation

## 2019-06-11 DIAGNOSIS — E119 Type 2 diabetes mellitus without complications: Secondary | ICD-10-CM | POA: Diagnosis not present

## 2019-06-11 DIAGNOSIS — I10 Essential (primary) hypertension: Secondary | ICD-10-CM | POA: Insufficient documentation

## 2019-06-11 DIAGNOSIS — I252 Old myocardial infarction: Secondary | ICD-10-CM | POA: Diagnosis not present

## 2019-06-11 DIAGNOSIS — R531 Weakness: Secondary | ICD-10-CM | POA: Insufficient documentation

## 2019-06-11 DIAGNOSIS — Z20828 Contact with and (suspected) exposure to other viral communicable diseases: Secondary | ICD-10-CM | POA: Insufficient documentation

## 2019-06-11 DIAGNOSIS — I2510 Atherosclerotic heart disease of native coronary artery without angina pectoris: Secondary | ICD-10-CM | POA: Insufficient documentation

## 2019-06-11 LAB — CBC WITH DIFFERENTIAL/PLATELET
Abs Immature Granulocytes: 0.01 10*3/uL (ref 0.00–0.07)
Basophils Absolute: 0 10*3/uL (ref 0.0–0.1)
Basophils Relative: 0 %
Eosinophils Absolute: 0 10*3/uL (ref 0.0–0.5)
Eosinophils Relative: 0 %
HCT: 48.1 % (ref 39.0–52.0)
Hemoglobin: 15.5 g/dL (ref 13.0–17.0)
Immature Granulocytes: 0 %
Lymphocytes Relative: 14 %
Lymphs Abs: 0.4 10*3/uL — ABNORMAL LOW (ref 0.7–4.0)
MCH: 29.9 pg (ref 26.0–34.0)
MCHC: 32.2 g/dL (ref 30.0–36.0)
MCV: 92.7 fL (ref 80.0–100.0)
Monocytes Absolute: 0.4 10*3/uL (ref 0.1–1.0)
Monocytes Relative: 16 %
Neutro Abs: 1.8 10*3/uL (ref 1.7–7.7)
Neutrophils Relative %: 70 %
Platelets: 109 10*3/uL — ABNORMAL LOW (ref 150–400)
RBC: 5.19 MIL/uL (ref 4.22–5.81)
RDW: 13.5 % (ref 11.5–15.5)
WBC: 2.6 10*3/uL — ABNORMAL LOW (ref 4.0–10.5)
nRBC: 0 % (ref 0.0–0.2)

## 2019-06-11 LAB — URINALYSIS, ROUTINE W REFLEX MICROSCOPIC
Bacteria, UA: NONE SEEN
Bilirubin Urine: NEGATIVE
Glucose, UA: NEGATIVE mg/dL
Hgb urine dipstick: NEGATIVE
Ketones, ur: 5 mg/dL — AB
Leukocytes,Ua: NEGATIVE
Nitrite: NEGATIVE
Protein, ur: 30 mg/dL — AB
Specific Gravity, Urine: 1.025 (ref 1.005–1.030)
pH: 5 (ref 5.0–8.0)

## 2019-06-11 LAB — COMPREHENSIVE METABOLIC PANEL
ALT: 92 U/L — ABNORMAL HIGH (ref 0–44)
AST: 97 U/L — ABNORMAL HIGH (ref 15–41)
Albumin: 3.9 g/dL (ref 3.5–5.0)
Alkaline Phosphatase: 67 U/L (ref 38–126)
Anion gap: 10 (ref 5–15)
BUN: 21 mg/dL (ref 8–23)
CO2: 24 mmol/L (ref 22–32)
Calcium: 8.4 mg/dL — ABNORMAL LOW (ref 8.9–10.3)
Chloride: 97 mmol/L — ABNORMAL LOW (ref 98–111)
Creatinine, Ser: 1.04 mg/dL (ref 0.61–1.24)
GFR calc Af Amer: >60 mL/min (ref >60)
GFR calc non Af Amer: >60 mL/min (ref >60)
Glucose, Bld: 142 mg/dL — ABNORMAL HIGH (ref 70–99)
Potassium: 4 mmol/L (ref 3.5–5.1)
Sodium: 131 mmol/L — ABNORMAL LOW (ref 135–145)
Total Bilirubin: 0.9 mg/dL (ref 0.3–1.2)
Total Protein: 7.1 g/dL (ref 6.5–8.1)

## 2019-06-11 LAB — TROPONIN I (HIGH SENSITIVITY)
Troponin I (High Sensitivity): 11 ng/L (ref <18)
Troponin I (High Sensitivity): 13 ng/L (ref <18)

## 2019-06-11 LAB — TSH: TSH: 1.724 u[IU]/mL (ref 0.350–4.500)

## 2019-06-11 LAB — SARS CORONAVIRUS 2 BY RT PCR (HOSPITAL ORDER, PERFORMED IN ~~LOC~~ HOSPITAL LAB): SARS Coronavirus 2: NEGATIVE

## 2019-06-11 MED ORDER — SODIUM CHLORIDE 0.9 % IV BOLUS
500.0000 mL | Freq: Once | INTRAVENOUS | Status: AC
  Administered 2019-06-11: 500 mL via INTRAVENOUS

## 2019-06-11 NOTE — ED Provider Notes (Signed)
Emergency Department Provider Note   I have reviewed the triage vital signs and the nursing notes.   HISTORY  Chief Complaint Weakness   HPI Shawn Lopez is a 78 y.o. male with past medical history reviewed below presents to the emergency department with generalized weakness, fever, diarrhea, and loss of taste/smell.  Symptoms have been ongoing for the past 4 days.  He denies chest pain or shortness of breath.  No sick contacts.  He states that he cannot taste his food and so does not feel like eating.  He has occasional nausea but no vomiting.  He had one episode of nonbloody diarrhea this morning.  He denies abdominal pain.  Reports that his max temp in the past 2 days has been 101 F.    Past Medical History:  Diagnosis Date  . Arthritis   . Atrial fibrillation ([facility]) 2006   a. in 2006; not on anticoagulation, no recent atrial fib seen as of 2018.  . Borderline hypertension   . CAD (coronary artery disease)    a. NSTEMI 12/2016: cath showing 95% mid-LAD stenosis, 75% prox-LAD stenosis and 75% 1st Diag stenosis. DES to the proximal and mid-LAD along with the 1st Diagonal..  . Chronic back pain    Prior trauma with vertebral fracture  . Hyperlipidemia   . Hypertension   . Osteopenia   . Sinus bradycardia    a. metoprolol decreased 05/2017 due to this.  . Type 2 diabetes mellitus [facility])     Patient Active Problem List   Diagnosis Date Noted  . 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 CAD (coronary artery disease) with PCI 12/2016 06/05/2017  . Chest pain 01/13/2017  . NSTEMI (non-ST elevated myocardial infarction) ([facility]) 01/13/2017  . Pain in joint, shoulder region 10/20/2013  . Muscle spasm of right shoulder 10/20/2013  . Right rotator cuff tear 10/14/2013  . 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 Atrial fibrillation ([facility])   . Essential hypertension   . Hyperlipidemia LDL goal <70   . Diabetes mellitus ([facility])   . Osteopenia   . Chronic back pain   . Chest  pain     Past Surgical History:  Procedure Laterality Date  . APPENDECTOMY  1960  . CATARACT EXTRACTION  2012   right eye,lens implantation  . CHOLECYSTECTOMY    . CIRCUMCISION  2010   meatotomy and dilatation  . COLONOSCOPY N/A 01/14/2013   Procedure: COLONOSCOPY;  Surgeon: Dalia Heading, MD;  [facility];  Service: Gastroenterology;  Laterality: N/A;  . CORONARY ANGIOGRAPHY N/A 01/15/2017   Procedure: Coronary Angiography;  Surgeon: Runell Gess, MD;  Location: [facility] INVAS[facility] LAB;  Service: Cardiovascular;  Laterality: N/A;  . CORONARY STENT INTERVENTION N/A 01/15/2017   Procedure: Coronary Stent Intervention;  Surgeon: Runell Gess, MD;  Location: [facility] INVASIVE CV LAB;  Service: Cardiovascular;  Laterality: N/A;  . INTRAVASCULAR PRESSURE WIRE/FFR STUDY N/A 04/04/2019   Procedure: INTRAVASCULAR PRESSURE WIRE/FFR STUDY;  Surgeon: Yvonne Kendall, MD;  Location: [facility] INVASIVE CV LAB;  Service: Cardiovascular;  Laterality: N/A;  . LEFT HEART CATH AND CORONARY ANGIOGRAPHY N/A 04/04/2019   Procedure: LEFT HEART CATH AND CORONARY ANGIOGRAPHY;  Surgeon: Yvonne Kendall, MD;  Location: [facility] INVASIVE CV LAB;  Service: Cardiovascular;  Laterality: N/A;    Allergies Brilinta [ticagrelor]  Family History  Problem Relation Age of Onset  . CAD Father   . Heart attack Brother     Social History Social History   Tobacco Use  .  Smoking status: Former Smoker    Packs/day: 1.50    Years: 15.00    Pack years: 22.50    Types: Cigarettes    Quit date: 04/20/1997    Years since quitting: 22.1  . Smokeless tobacco: Never Used  Substance Use Topics  . Alcohol use: No  . Drug use: No    Review of Systems  Constitutional: Positive fever. Positive generalized weakness.  Eyes: No visual changes. ENT: No sore throat. Loss of taste/smell.  Cardiovascular: Denies chest pain. Respiratory: Denies shortness of breath. Gastrointestinal: No abdominal pain.  No nausea, no vomiting.  Positive diarrhea.  No constipation. Genitourinary: Negative for dysuria. Musculoskeletal: Negative for back pain. Skin: Negative for rash. Neurological: Negative for headaches, focal weakness or numbness.  10-point ROS otherwise negative.  ____________________________________________   PHYSICAL EXAM:  VITAL SIGNS: ED Triage Vitals  Enc Vitals Group     BP 06/11/19 1036 (!) 148/75     Pulse Rate 06/11/19 1036 71     Resp 06/11/19 1036 18     Temp 06/11/19 1036 98.4 F (36.9 C)     Temp Source 06/11/19 1036 Oral     SpO2 06/11/19 1036 96 %     Weight 06/11/19 1033 196 lb (88.9 kg)     Height 06/11/19 1033 5\' 10"  (1.778 m)   Constitutional: Alert and oriented. Well appearing and in no acute distress. Eyes: Conjunctivae are normal. Head: Atraumatic. Nose: No congestion/rhinnorhea. Mouth/Throat: Mucous membranes are moist.  Neck: No stridor.   Cardiovascular: Normal rate, regular rhythm. Good peripheral circulation. Grossly normal heart sounds.   Respiratory: Normal respiratory effort.  No retractions. Lungs CTAB. Gastrointestinal: Soft and nontender. No distention.  Musculoskeletal: No lower extremity tenderness nor edema. No gross deformities of extremities. Neurologic:  Normal speech and language. No gross focal neurologic deficits are appreciated.   Skin:  Skin is warm, dry and intact. No rash noted.  ____________________________________________   LABS (all labs ordered are listed, but only abnormal results are displayed)  Labs Reviewed  COMPREHENSIVE METABOLIC PANEL - Abnormal; Notable for the following components:      Result Value   Sodium 131 (*)    Chloride 97 (*)    Glucose, Bld 142 (*)    Calcium 8.4 (*)    AST 97 (*)    ALT 92 (*)    All other components within normal limits  CBC WITH DIFFERENTIAL/PLATELET - Abnormal; Notable for the following components:   WBC 2.6 (*)    Platelets 109 (*)    Lymphs Abs 0.4 (*)    All other components within normal  limits  URINALYSIS, ROUTINE W REFLEX MICROSCOPIC - Abnormal; Notable for the following components:   Ketones, ur 5 (*)    Protein, ur 30 (*)    All other components within normal limits  SARS CORONAVIRUS 2 (HOSPITAL ORDER, [place] [facility])  TSH  TROPONIN I (HIGH SENSITIVITY)  TROPONIN I (HIGH SENSITIVITY)   ____________________________________________  EKG   EKG Interpretation  Date/Time:  Wednesday June 11 2019 10:35:13 EDT Ventricular Rate:  64 PR Interval:    QRS Duration: 109 QT Interval:  402 QTC Calculation: 415 R Axis:   67 Text Interpretation:  Sinus arrhythmia No STEMI  Confirmed by Nanda Quinton [phone]) on 06/11/2019 10:39:35 AM       ____________________________________________  RADIOLOGY  Dg Chest Portable 1 View  Result Date: 06/11/2019 CLINICAL DATA:  Fever EXAM: PORTABLE CHEST 1 VIEW COMPARISON:  02/10/2018 FINDINGS: The  heart size and mediastinal contours are stable. No focal airspace consolidation, pleural effusion, or pneumothorax. The visualized skeletal structures are unremarkable. IMPRESSION: No acute cardiopulmonary findings. Electronically Signed   By: Duanne GuessNicholas  Plundo M.D.   On: 06/11/2019 11:08    ____________________________________________   PROCEDURES  Procedure(s) performed:   Procedures  None  ____________________________________________   INITIAL IMPRESSION / ASSESSMENT AND PLAN / ED COURSE  Pertinent labs & imaging results that were available during my care of the patient were reviewed by me and considered in my medical decision making (see chart for details).   Patient presents to the emergency department for evaluation of generalized weakness.  On further discussion he reports fever over the past 2 days with loss of taste and smell.  Episode of diarrhea noted this morning.  Concern for COVID-19 is elevated.  I have placed the patient on precautions and will do testing in addition to chest x-ray and labs.   Plan for gentle IV fluids.  Patient is overall well-appearing with no shortness of breath, hypoxemia. No focal neuro deficits to suspect CVA.   01:50 PM  Patient feeling improved after IVF. Awake and alert on re-evaluation. UA pending. Plan for PO challenge. COVID is negative. Remaining labs are unremarkable. Plan for discharge pending UA results +/- abx for UTI if present.   Care transferred to Dr. Estell HarpinZammit pending UA.  ____________________________________________  FINAL CLINICAL IMPRESSION(S) / ED DIAGNOSES  Final diagnoses:  Generalized weakness     MEDICATIONS GIVEN DURING THIS VISIT:  Medications  sodium chloride 0.9 % bolus 500 mL (0 mLs Intravenous Stopped 06/11/19 1324)    Note:  This document was prepared using Dragon voice recognition software and may include unintentional dictation errors.  Alona BeneJoshua Porchea Charrier, MD Emergency Medicine    Dandra Shambaugh, Arlyss RepressJoshua G, MD 06/11/19 [phone]

## 2019-06-11 NOTE — Discharge Instructions (Signed)
Drink plenty of fluids.  Follow up next week with your md if not improving

## 2019-06-11 NOTE — ED Triage Notes (Signed)
Pt reports generalized weakness x 4 days.  Denies cough, fever, pain, or sob.  Reports nausea and 1 episode of diarrhea today.

## 2019-08-08 ENCOUNTER — Other Ambulatory Visit: Payer: Self-pay

## 2019-08-08 DIAGNOSIS — Z20822 Contact with and (suspected) exposure to covid-19: Secondary | ICD-10-CM

## 2019-08-10 LAB — NOVEL CORONAVIRUS, NAA: SARS-CoV-2, NAA: NOT DETECTED

## 2019-08-12 ENCOUNTER — Telehealth: Payer: Self-pay

## 2019-08-12 NOTE — Telephone Encounter (Signed)
Pt. Given COVID 19 results, verbalizes understanding. 

## 2019-12-16 ENCOUNTER — Encounter: Payer: Self-pay | Admitting: Physician Assistant

## 2019-12-16 NOTE — Progress Notes (Addendum)
Cardiology Office Note   Date:  12/18/2019   ID:  Shawn Lopez, Shawn Lopez 03-Feb-1941, MRN [id number]  PCP:  [name], MD Cardiologist:  [name], MD 05/12/2019 Electrphysiologist: None Theodore Demark, PA-C   No chief complaint on file.   History of Present Illness: Shawn Lopez is a 79 y.o. male with a history of NSTEMI w/ LAD and Diag stents, patent at cath 2020, PAF 2006 not on anticoag, DM, HTN, HLD, bradycardia on BB>>improved off metoprolol  Shawn Lopez presents for cardiology follow up.   Since off the BB, HR baseline 54-58 bpm.  Supervisor at a construction site, walks a lot. Sometimes gets CP w/ exertion. It is mild, and resolves spontaneously.  He has never taken nitroglycerin.  He also gets L upper arm pain at times.  Has had w/ exertion, sometimes starts at rest. Inner L upper arm will ache, and/or get numb. Has not taken any med for these pains.  They are not related to position.  There is no change with any movement.  Does not feel limited in his ambulation by CP/SOB.   Has some daytime LE edema, does not wake with it.   Sometimes feels heart skip a lot, is not doing it today.  Skips have increased a lot since he stopped the beta-blocker.  However, he generally feels better since he got off the beta-blocker.  Checks his blood pressure occasionally, it is generally 130-160 systolic and the diastolic is in the 60s or 70s.    Past Medical History:  Diagnosis Date  . Arthritis   . Atrial fibrillation (HCC) 2006   a. in 2006; not on anticoagulation, no recent atrial fib seen as of 2018.  . Borderline hypertension   . CAD (coronary artery disease)    a. NSTEMI 12/2016: cath showing 95% mid-LAD stenosis, 75% prox-LAD stenosis and 75% 1st Diag stenosis. DES to the proximal and mid-LAD along with the 1st Diagonal..  . Chronic back pain    Prior trauma with vertebral fracture  . Hyperlipidemia   . Hypertension   . Osteopenia   .  Sinus bradycardia    a. metoprolol decreased 05/2017 due to this.  . Type 2 diabetes mellitus (HCC)     Past Surgical History:  Procedure Laterality Date  . APPENDECTOMY  1960  . CATARACT EXTRACTION  2012   right eye,lens implantation  . CHOLECYSTECTOMY    . CIRCUMCISION  2010   meatotomy and dilatation  . COLONOSCOPY N/A 01/14/2013   Procedure: COLONOSCOPY;  Surgeon: Dalia Heading, MD;  [facility];  Service: Gastroenterology;  Laterality: N/A;  . CORONARY ANGIOGRAPHY N/A 01/15/2017   Procedure: Coronary Angiography;  Surgeon: Runell Gess, MD;  Location: [facility] INVASIVE CV LAB;  Service: Cardiovascular;  Laterality: N/A;  . CORONARY STENT INTERVENTION N/A 01/15/2017   Procedure: Coronary Stent Intervention;  Surgeon: Runell Gess, MD;  Location: [facility] INVASIVE CV LAB;  Service: Cardiovascular;  Laterality: N/A;  . INTRAVASCULAR PRESSURE WIRE/FFR STUDY N/A 04/04/2019   Procedure: INTRAVASCULAR PRESSURE WIRE/FFR STUDY;  Surgeon: Yvonne Kendall, MD;  Location: [facility] INVASIVE CV LAB;  Service: Cardiovascular;  Laterality: N/A;  . LEFT HEART CATH AND CORONARY ANGIOGRAPHY N/A 04/04/2019   Procedure: LEFT HEART CATH AND CORONARY ANGIOGRAPHY;  Surgeon: Yvonne Kendall, MD;  Location: [facility] INVASIVE CV LAB;  Service: Cardiovascular;  Laterality: N/A;    Current Outpatient Medications  Medication Sig Dispense Refill  . amLODipine (NORVASC) 2.5 MG tablet Take 1 tablet (  2.5 mg total) by mouth daily. 30 tablet 11  . aspirin EC 81 MG EC tablet Take 1 tablet (81 mg total) by mouth daily. 30 tablet 0  . atorvastatin (LIPITOR) 80 MG tablet Take 1 tablet (80 mg total) by mouth daily at 6 PM. 90 tablet 3  . clopidogrel (PLAVIX) 75 MG tablet Take 1 tablet (75 mg total) by mouth daily. 90 tablet 3  . glimepiride (AMARYL) 1 MG tablet Take 1 mg by mouth daily with breakfast.     . HYDROcodone-acetaminophen (NORCO) 10-325 MG tablet Take 1 tablet by mouth every 6 (six) hours as needed for moderate  pain.   0  . lisinopril (PRINIVIL,ZESTRIL) 5 MG tablet Take 1 tablet (5 mg total) by mouth daily. 90 tablet 3  . nitroGLYCERIN (NITROSTAT) 0.4 MG SL tablet Place 1 tablet (0.4 mg total) under the tongue every 5 (five) minutes as needed for chest pain. For 3 doses only 30 tablet 0   No current facility-administered medications for this visit.    Allergies:   Brilinta [ticagrelor]    Social History:  The patient  reports that he quit smoking about 22 years ago. His smoking use included cigarettes. He has a 22.50 pack-year smoking history. He has never used smokeless tobacco. He reports that he does not drink alcohol or use drugs.   Family History:  The patient's family history includes CAD in his father; Heart attack in his brother.  He indicated that his mother is deceased. He indicated that his father is deceased. He indicated that his sister is alive. He indicated that his brother is deceased.    ROS:  Please see the history of present illness. All other systems are reviewed and negative.    PHYSICAL EXAM: VS:  BP 140/70   Pulse 70   Temp 98.5 F (36.9 C)   Ht 5\' 10"  (1.778 m)   Wt 197 lb 9.6 oz (89.6 kg)   SpO2 97%   BMI 28.35 kg/m  , BMI Body mass index is 28.35 kg/m. GEN: Well nourished, well developed, male in no acute distress HEENT: normal for age  Neck: no JVD, no carotid bruit, no masses Cardiac: RRR; soft murmur, no rubs, or gallops Respiratory:  clear to auscultation bilaterally, normal work of breathing GI: soft, nontender, nondistended, + BS MS: no deformity or atrophy; no edema; distal pulses are 2+ in all 4 extremities  Skin: warm and dry, no rash Neuro:  Strength and sensation are intact Psych: euthymic mood, full affect   EKG:  EKG is not ordered today.  Cardiac catheterization 04/04/2019: Conclusions: 1. Mild to moderate, non-obstructive coronary artery disease, as detailed below. Long segment of three overlapping stents in the proximal through distal  LAD is patent with up to 50% in-stent restenosis in the mid section that is not hemodynamically significant at this time with a DFR of 0.90-0.91 (cut point for significance is 0.89). 2. Widely patent stent in D1 with 50% focal disease proximal to the stent, which is not hemodynamically significant (DFR 0.95). 3. Normal left ventricular systolic function with mildly elevated filling pressure suggestive of diastolic dysfunction. 4. Sinus bradycardia with heart rates of 40-50 bpm noted throughout most of the case.  Recommendations: 1. Continue aggressive medical therapy, including indefinite dual antiplatelet therapy with aspirin and clopidogrel given long stented segment of the LAD. 2. Question if chronotropic incompetence is contributing to symptoms; I will decrease metoprolol tartrate to 12.5 twice daily. 3. Add amlodipine 2.5 mg daily for improved  blood pressure control and antianginal therapy, as Shawn Lopez has been intolerant of isosorbide mononitrate.  Echocardiogram 01/14/2017: Study Conclusions  - Left ventricle: The cavity size was normal. There was mild focal basal hypertrophy of the septum. Systolic function was normal. The estimated ejection fraction was in the range of 55% to 60%. Wall motion was normal; there were no regional wall motion abnormalities. Doppler parameters are consistent with abnormal left ventricular relaxation (grade 1 diastolic dysfunction). Doppler parameters are consistent with high ventricular filling pressure. - Aortic valve: There was trivial regurgitation. - Mitral valve: Calcified annulus. Mildly thickened leaflets . - Left atrium: The atrium was moderately dilated. - Atrial septum: There was increased thickness of the septum, consistent with lipomatous hypertrophy. - Pulmonary arteries: Systolic pressure was mildly increased. PA peak pressure: 32 mm Hg (S).  Impressions:  - Normal LV systolic function; grade 1 diastolic  dysfunction with elevated LV filling pressure; trace AI; moderate LAE; mild TR with mildly elevated pulmonary pressure.   Recent Labs: 06/11/2019: ALT 92; BUN 21; Creatinine, Ser 1.04; Hemoglobin 15.5; Platelets 109; Potassium 4.0; Sodium 131; TSH 1.724  CBC    Component Value Date/Time   WBC 2.6 (L) 06/11/2019 1043   RBC 5.19 06/11/2019 1043   HGB 15.5 06/11/2019 1043   HCT 48.1 06/11/2019 1043   PLT 109 (L) 06/11/2019 1043   MCV 92.7 06/11/2019 1043   MCH 29.9 06/11/2019 1043   MCHC 32.2 06/11/2019 1043   RDW 13.5 06/11/2019 1043   LYMPHSABS 0.4 (L) 06/11/2019 1043   MONOABS 0.4 06/11/2019 1043   EOSABS 0.0 06/11/2019 1043   BASOSABS 0.0 06/11/2019 1043   CMP Latest Ref Rng & Units 06/11/2019 04/02/2019 02/10/2018  Glucose 70 - 99 mg/dL 161(W) 960(A) 540(J)  BUN 8 - 23 mg/dL 21 15 19   Creatinine 0.61 - 1.24 mg/dL 8.11 9.14  Sodium 135 - 145 mmol/L 131(L) 135 138  Potassium 3.5 - 5.1 mmol/L 4.0 3.6 3.9  Chloride 98 - 111 mmol/L 97(L) 103 104  CO2 22 - 32 mmol/L 24 24 26   Calcium 8.9 - 10.3 mg/dL 7.82) ) 9.6  Total Protein 6.5 - 8.1 g/dL 7.1 - -  Total Bilirubin 0.3 - 1.2 mg/dL 0.9 - -  Alkaline Phos 38 - 126 U/L 67 - -  AST 15 - 41 U/L 97(H) - -  ALT 0 - 44 U/L 92(H) - -     Lipid Panel Lab Results  Component Value Date   CHOL 123 01/15/2017   HDL 36 (L) 01/15/2017   LDLCALC 55 01/15/2017   TRIG 158 (H) 01/15/2017   CHOLHDL 3.4 01/15/2017      Wt Readings from Last 3 Encounters:  12/18/19 197 lb 9.6 oz (89.6 kg)  06/11/19 196 lb (88.9 kg)  05/12/19 198 lb (89.8 kg)     Other studies Reviewed: Additional studies/ records that were reviewed today include: Office notes, hospital records and testing.  ASSESSMENT AND PLAN:  1.  CAD: -Showed Shawn Lopez his cath diagram, and explained that he could get some chest pain with exertion related to some small vessels with blockages. -Also explained that we needed to keep his blood pressure under  better control to keep the other blockages from getting worse -He does not like nitroglycerin because of the side effects, so will not add Imdur at this time -Will increase amlodipine, this may help angina and blood pressure -He has good distal pulses, so do not think his left arm pain is related to  circulation -I am concerned that it is an anginal equivalent, but he also gets chest pain at times with exertion. -Follow symptoms with med changes, improved blood pressure may help  2.  Hypertension: -Blood pressure is elevated today, I suspect that he is above target most of the time. -Increase amlodipine to 5 mg daily, continue other meds as scheduled  3.  Palpitations: -I explained that because of his history of PAF, we need to figure this out -He feels much better off the beta-blocker and is still bradycardic at baseline, will not try to restart it -Showed him the Alivecor monitor which he can get if he wishes -For now, will get a 2-week ZIO monitor, extend for another 2 weeks if he does not show anything. -If he has problems with presyncope or syncope, he is to contact us   Current medicines are reviewed at length with the patient today.  The patient does not have concerns regarding medicines.  The following changes have been made: Increase amlodipine  Labs/ tests ordered today include:  No orders of the defined types were placed in this encounter.    Disposition:   FU with Rozann Lesches, MD  Signed, Rosaria Ferries, PA-C  12/18/2019 1:29 PM    [facility] Phone: [phone]; Fax: [phone]

## 2019-12-18 ENCOUNTER — Encounter: Payer: Self-pay | Admitting: Physician Assistant

## 2019-12-18 ENCOUNTER — Ambulatory Visit (INDEPENDENT_AMBULATORY_CARE_PROVIDER_SITE_OTHER): Payer: Medicare HMO

## 2019-12-18 ENCOUNTER — Ambulatory Visit: Payer: Medicare HMO | Admitting: Physician Assistant

## 2019-12-18 VITALS — BP 140/70 | HR 70 | Temp 98.5°F | Ht 70.0 in | Wt 197.6 lb

## 2019-12-18 DIAGNOSIS — R002 Palpitations: Secondary | ICD-10-CM

## 2019-12-18 DIAGNOSIS — I10 Essential (primary) hypertension: Secondary | ICD-10-CM

## 2019-12-18 DIAGNOSIS — I25119 Atherosclerotic heart disease of native coronary artery with unspecified angina pectoris: Secondary | ICD-10-CM | POA: Diagnosis not present

## 2019-12-18 MED ORDER — AMLODIPINE BESYLATE 5 MG PO TABS: 5.0000 mg | ORAL_TABLET | Freq: Every day | ORAL | 3 refills | Status: DC

## 2019-12-18 NOTE — Patient Instructions (Signed)
Medication Instructions:  Your physician has recommended you make the following change in your medication:  Increase Norvasc to 5 mg Daily   *If you need a refill on your cardiac medications before your next appointment, please call your pharmacy*   Lab Work: NONE   If you have labs (blood work) drawn today and your tests are completely normal, you will receive your results only by: Marland Kitchen MyChart Message (if you have MyChart) OR . A paper copy in the mail If you have any lab test that is abnormal or we need to change your treatment, we will call you to review the results.   Testing/Procedures: Your physician has recommended that you wear an event monitor. Event monitors are medical devices that record the heart's electrical activity. Doctors most often [place] these monitors to diagnose arrhythmias. Arrhythmias are problems with the speed or rhythm of the heartbeat. The monitor is a small, portable device. You can wear one while you do your normal daily activities. This is usually used to diagnose what is causing palpitations/syncope (passing out).   Follow-Up: At [facility], you and your health needs are our priority.  As part of our continuing mission to provide you with exceptional heart care, we have created designated Provider Care Teams.  These Care Teams include your primary Cardiologist (physician) and Advanced Practice Providers (APPs -  Physician Assistants and Nurse Practitioners) who all work together to provide you with the care you need, when you need it.  We recommend signing up for the patient portal called "MyChart".  Sign up information is provided on this After Visit Summary.  MyChart is used to connect with patients for Virtual Visits (Telemedicine).  Patients are able to view lab/test results, encounter notes, upcoming appointments, etc.  Non-urgent messages can be sent to your provider as well.   To learn more about what you can do with MyChart, go to [email or web address].     Your next appointment:   6 week(s)  The format for your next appointment:   In Person  Provider:   You may see [name], MD or one of the following Advanced Practice Providers on your designated Care Team:    Randall An, PA-C   Jacolyn Reedy, PA-C     Other Instructions Thank you for choosing [facility]!

## 2020-01-13 ENCOUNTER — Other Ambulatory Visit: Payer: Self-pay

## 2020-01-14 ENCOUNTER — Other Ambulatory Visit (HOSPITAL_COMMUNITY): Payer: Self-pay | Admitting: Nurse Practitioner

## 2020-01-14 ENCOUNTER — Other Ambulatory Visit (HOSPITAL_COMMUNITY): Payer: Self-pay | Admitting: Otolaryngology

## 2020-01-14 DIAGNOSIS — M25511 Pain in right shoulder: Secondary | ICD-10-CM

## 2020-01-14 DIAGNOSIS — R2231 Localized swelling, mass and lump, right upper limb: Secondary | ICD-10-CM

## 2020-01-20 ENCOUNTER — Ambulatory Visit (HOSPITAL_COMMUNITY)
Admission: RE | Admit: 2020-01-20 | Discharge: 2020-01-20 | Disposition: A | Discharge status: Home / Self Care | Payer: Medicare HMO | Source: Ambulatory Visit | Attending: Nurse Practitioner | Admitting: Nurse Practitioner

## 2020-01-20 ENCOUNTER — Other Ambulatory Visit: Payer: Self-pay

## 2020-01-20 DIAGNOSIS — M25511 Pain in right shoulder: Secondary | ICD-10-CM

## 2020-01-20 DIAGNOSIS — R2231 Localized swelling, mass and lump, right upper limb: Secondary | ICD-10-CM | POA: Diagnosis present

## 2020-01-26 ENCOUNTER — Ambulatory Visit (INDEPENDENT_AMBULATORY_CARE_PROVIDER_SITE_OTHER): Payer: Medicare HMO | Admitting: Cardiology

## 2020-01-26 ENCOUNTER — Other Ambulatory Visit: Payer: Self-pay

## 2020-01-26 ENCOUNTER — Encounter: Payer: Self-pay | Admitting: Cardiology

## 2020-01-26 VITALS — BP 138/70 | HR 56 | Temp 98.5°F | Ht 70.5 in | Wt 200.0 lb

## 2020-01-26 DIAGNOSIS — I25119 Atherosclerotic heart disease of native coronary artery with unspecified angina pectoris: Secondary | ICD-10-CM | POA: Diagnosis not present

## 2020-01-26 DIAGNOSIS — E782 Mixed hyperlipidemia: Secondary | ICD-10-CM

## 2020-01-26 DIAGNOSIS — R002 Palpitations: Secondary | ICD-10-CM

## 2020-01-26 NOTE — Patient Instructions (Signed)
Medication Instructions: Your physician recommends that you continue on your current medications as directed. Please refer to the Current Medication list given to you today.   Labwork: Fasting lipid and lft's JUST BEFORE next visit   Procedures/Testing: None today  Follow-Up: 6 months office visit with Dr.McDowell  Any Additional Special Instructions Will Be Listed Below (If Applicable).     If you need a refill on your cardiac medications before your next appointment, please call your pharmacy.

## 2020-01-26 NOTE — Progress Notes (Signed)
Cardiology Office Note  Date: 01/26/2020   ID: Shawn Lopez, Steeves August 01, 1941, MRN [id number]  PCP:  [name], MD  Cardiologist:  [name], MD Electrophysiologist:  None   Chief Complaint  Patient presents with  . Cardiac follow-up    History of Present Illness: Shawn Lopez is a 79 y.o. male last seen in March by [name] PA-C.  He presents for a follow-up visit.  He tells me that he has felt the best he has in quite a while over the last 3 weeks.  During this time he decided to stop Norvasc which was making him dizzy.  He does not report any palpitations or syncope, no angina symptoms or nitroglycerin use.  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  Interval cardiac monitor showed rare PACs and PVCs as well as very brief episodes of SVT, no sustained arrhythmias.  We had already taken him off beta-blocker in the past due to symptomatic bradycardia, and I would not add any AV nodal blockers at this time.  I reviewed his medications which are outlined below.  Past Medical History:  Diagnosis Date  . Arthritis   . Atrial fibrillation (HCC) 2006   a. in 2006; not on anticoagulation, no recent atrial fib seen as of 2018.  . Borderline hypertension   . CAD (coronary artery disease)    a. NSTEMI 12/2016: cath showing 95% mid-LAD stenosis, 75% prox-LAD stenosis and 75% 1st Diag stenosis. DES to the proximal and mid-LAD along with the 1st Diagonal..  . Chronic back pain    Prior trauma with vertebral fracture  . Hyperlipidemia   . Hypertension   . Osteopenia   . Sinus bradycardia   . Type 2 diabetes mellitus (HCC)     Past Surgical History:  Procedure Laterality Date  . APPENDECTOMY  1960  . CATARACT EXTRACTION  2012   right eye,lens implantation  . CHOLECYSTECTOMY    . CIRCUMCISION  2010   meatotomy and dilatation  . COLONOSCOPY N/A 01/14/2013   Procedure: COLONOSCOPY;  Surgeon: Dalia Heading, MD;  [facility]: [facility];  Service:  Gastroenterology;  Laterality: N/A;  . CORONARY ANGIOGRAPHY N/A 01/15/2017   Procedure: Coronary Angiography;  Surgeon: Runell Gess, MD;  Location: [facility] INVASIVE CV LAB;  Service: Cardiovascular;  Laterality: N/A;  . CORONARY STENT INTERVENTION N/A 01/15/2017   Procedure: Coronary Stent Intervention;  Surgeon: Runell Gess, MD;  Location: [facility] INVASIVE CV LAB;  Service: Cardiovascular;  Laterality: N/A;  . INTRAVASCULAR PRESSURE WIRE/FFR STUDY N/A 04/04/2019   Procedure: INTRAVASCULAR PRESSURE WIRE/FFR STUDY;  Surgeon: Yvonne Kendall, MD;  Location: [facility] INVASIVE CV LAB;  Service: Cardiovascular;  Laterality: N/A;  . LEFT HEART CATH AND CORONARY ANGIOGRAPHY N/A 04/04/2019   Procedure: LEFT HEART CATH AND CORONARY ANGIOGRAPHY;  Surgeon: Yvonne Kendall, MD;  Location: [facility] INVASIVE CV LAB;  Service: Cardiovascular;  Laterality: N/A;    Current Outpatient Medications  Medication Sig Dispense Refill  . aspirin EC 81 MG EC tablet Take 1 tablet (81 mg total) by mouth daily. 30 tablet 0  . atorvastatin (LIPITOR) 80 MG tablet Take 1 tablet (80 mg total) by mouth daily at 6 PM. 90 tablet 3  . clopidogrel (PLAVIX) 75 MG tablet Take 1 tablet (75 mg total) by mouth daily. 90 tablet 3  . glimepiride (AMARYL) 1 MG tablet Take 1 mg by mouth daily with breakfast.     . HYDROcodone-acetaminophen (NORCO) 10-325 MG tablet Take 1 tablet by mouth every  6 (six) hours as needed for moderate pain.   0  . lisinopril (PRINIVIL,ZESTRIL) 5 MG tablet Take 1 tablet (5 mg total) by mouth daily. 90 tablet 3  . nitroGLYCERIN (NITROSTAT) 0.4 MG SL tablet Place 1 tablet (0.4 mg total) under the tongue every 5 (five) minutes as needed for chest pain. For 3 doses only 30 tablet 0   No current facility-administered medications for this visit.   Allergies:  Brilinta [ticagrelor]   ROS:   No syncope.  Physical Exam: VS:  BP 138/70   Pulse (!) 56   Temp 98.5 F (36.9 C)   Ht 5' 10.5" (1.791 m)   Wt 200 lb (90.7 kg)   SpO2  96%   BMI 28.29 kg/m , BMI Body mass index is 28.29 kg/m.  Wt Readings from Last 3 Encounters:  01/26/20 200 lb (90.7 kg)  12/18/19 197 lb 9.6 oz (89.6 kg)  06/11/19 196 lb (88.9 kg)    General: Elderly male, appears comfortable at rest. HEENT: Conjunctiva and lids normal, wearing a mask. Neck: Supple, no elevated JVP or carotid bruits, no thyromegaly. Lungs: Clear to auscultation, nonlabored breathing at rest. Cardiac: Regular rate and rhythm, no S3 or significant systolic murmur, no pericardial rub. Abdomen: Soft, bowel sounds present. Extremities: No pitting edema, distal pulses 2+.  ECG:  An ECG dated 06/11/2019 was personally reviewed today and demonstrated:  Sinus arrhythmia.  Recent Labwork: 06/11/2019: ALT 92; AST 97; BUN 21; Creatinine, Ser 1.04; Hemoglobin 15.5; Platelets 109; Potassium 4.0; Sodium 131; TSH 1.724     Component Value Date/Time   CHOL 123 01/15/2017 0702   TRIG 158 (H) 01/15/2017 0702   HDL 36 (L) 01/15/2017 0702   CHOLHDL 3.4 01/15/2017 0702   VLDL 32 01/15/2017 0702   LDLCALC 55 01/15/2017 0702    Other Studies Reviewed Today:  Cardiac catheterization 04/04/2019: Conclusions: 1. Mild to moderate, non-obstructive coronary artery disease, as detailed below.  Long segment of three overlapping stents in the proximal through distal LAD is patent with up to 50% in-stent restenosis in the mid section that is not hemodynamically significant at this time with a DFR of 0.90-0.91 (cut point for significance is 0.89). 2. Widely patent stent in D1 with 50% focal disease proximal to the stent, which is not hemodynamically significant (DFR 0.95). 3. Normal left ventricular systolic function with mildly elevated filling pressure suggestive of diastolic dysfunction. 4. Sinus bradycardia with heart rates of 40-50 bpm noted throughout most of the case.  Recommendations: 1. Continue aggressive medical therapy, including indefinite dual antiplatelet therapy with aspirin  and clopidogrel given long stented segment of the LAD. 2. Question if chronotropic incompetence is contributing to symptoms; I will decrease metoprolol tartrate to 12.5 twice daily. 3. Add amlodipine 2.5 mg daily for improved blood pressure control and antianginal therapy, as Mr. Turnley has been intolerant of isosorbide mononitrate.  Assessment and Plan:  1.  CAD status post DES to the proximal and mid LAD as well as diagonal in April 2018, patent by cardiac catheterization in 2020 with plan for medical therapy.  He is doing well at this time, no active angina or nitroglycerin use.  He did not tolerate Norvasc due to lightheadedness and fatigue.  Continue aspirin, Plavix, Lipitor, lisinopril, and as needed nitroglycerin.  2.  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  Check FLP and LFTs for next visit.  3.  History of palpitations, no active symptoms at this time, no syncope.  Interval cardiac monitor reviewed.  Would not  start him on AV nodal blockers given history of bradycardia.  He did not have any sustained arrhythmias.  Medication Adjustments/Labs and Tests Ordered: Current medicines are reviewed at length with the patient today.  Concerns regarding medicines are outlined above.   Tests Ordered: Orders Placed This Encounter  Procedures  . Lipid Profile  . Hepatic function panel    Medication Changes: No orders of the defined types were placed in this encounter.   Disposition:  Follow up 6 months in the [place] office.  Signed, Jonelle Sidle, MD, [facility] 01/26/2020 1:16 PM    [facility] at [facility] [address], [place], [place] [address] Phone: [phone]; Fax: [phone]

## 2020-02-27 ENCOUNTER — Encounter: Payer: Self-pay | Admitting: Urology

## 2020-02-27 ENCOUNTER — Other Ambulatory Visit: Payer: Self-pay

## 2020-02-27 ENCOUNTER — Ambulatory Visit (INDEPENDENT_AMBULATORY_CARE_PROVIDER_SITE_OTHER): Payer: Medicare HMO | Admitting: Urology

## 2020-02-27 VITALS — BP 155/61 | HR 56 | Temp 97.7°F | Ht 70.0 in | Wt 195.0 lb

## 2020-02-27 DIAGNOSIS — N403 Nodular prostate with lower urinary tract symptoms: Secondary | ICD-10-CM

## 2020-02-27 DIAGNOSIS — R351 Nocturia: Secondary | ICD-10-CM | POA: Diagnosis not present

## 2020-02-27 DIAGNOSIS — R1031 Right lower quadrant pain: Secondary | ICD-10-CM

## 2020-02-27 LAB — POCT URINALYSIS DIPSTICK
Bilirubin, UA: NEGATIVE
Blood, UA: NEGATIVE
Glucose, UA: NEGATIVE
Ketones, UA: NEGATIVE
Leukocytes, UA: NEGATIVE
Nitrite, UA: NEGATIVE
Protein, UA: NEGATIVE
Spec Grav, UA: 1.015 (ref 1.010–1.025)
Urobilinogen, UA: NEGATIVE E.U./dL — AB
pH, UA: 5 (ref 5.0–8.0)

## 2020-02-27 NOTE — Progress Notes (Signed)
Subjective: 1. 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 Nocturia   3. Groin pain, right     Shawn Lopez is a 79 yo WM who is sent by Jayme Cloud NP for a possible nodule on recent exam.  His PSA was only 0.6.  He has some increased urgency and had some right groin pain about 2 months ago.  The pain was mild and not associated with nausea or hematuria.  He has had no history of stones, UTI's or GU surgery.  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  His UA today is clear.  His IPSS is 5.  He has nocturia x 1 and some urgency.   He had a renal US in 2018 that was normal.  He reports a recent PSA but I don't have that.  ROS:  ROS  Allergies  Allergen Reactions   Brilinta [Ticagrelor]     Dyspnea     Past Medical History:  Diagnosis Date   Arthritis    Atrial fibrillation (HCC) 2006   a. in 2006; not on anticoagulation, no recent atrial fib seen as of 2018.   Borderline hypertension    CAD (coronary artery disease)    a. NSTEMI 12/2016: cath showing 95% mid-LAD stenosis, 75% prox-LAD stenosis and 75% 1st Diag stenosis. DES to the proximal and mid-LAD along with the 1st Diagonal..   Chronic back pain    Prior trauma with vertebral fracture   Hyperlipidemia    Hypertension    Osteopenia    Sinus bradycardia    Type 2 diabetes mellitus (HCC)     Past Surgical History:  Procedure Laterality Date   APPENDECTOMY  1960   CATARACT EXTRACTION  2012   right eye,lens implantation   CHOLECYSTECTOMY     CIRCUMCISION  2010   meatotomy and dilatation   COLONOSCOPY N/A 01/14/2013   Procedure: COLONOSCOPY;  Surgeon: Dalia Heading, MD;  Location: [facility];  Service: Gastroenterology;  Laterality: N/A;   CORONARY ANGIOGRAPHY N/A 01/15/2017   Procedure: Coronary Angiography;  Surgeon: Runell Gess, MD;  Location: [facility] INVASIVE CV LAB;  Service: Cardiovascular;  Laterality: N/A;   CORONARY STENT INTERVENTION N/A 01/15/2017   Procedure: Coronary  Stent Intervention;  Surgeon: Runell Gess, MD;  Location: [facility] INVASIVE CV LAB;  Service: Cardiovascular;  Laterality: N/A;   INTRAVASCULAR PRESSURE WIRE/FFR STUDY N/A 04/04/2019   Procedure: INTRAVASCULAR PRESSURE WIRE/FFR STUDY;  Surgeon: Yvonne Kendall, MD;  Location: [facility] INVASIVE CV LAB;  Service: Cardiovascular;  Laterality: N/A;   LEFT HEART CATH AND CORONARY ANGIOGRAPHY N/A 04/04/2019   Procedure: LEFT HEART CATH AND CORONARY ANGIOGRAPHY;  Surgeon: Yvonne Kendall, MD;  Location: [facility] INVASIVE CV LAB;  Service: Cardiovascular;  Laterality: N/A;    Social History   Socioeconomic History   Marital status: Married    Spouse name: Not on file   Number of children: 4   Years of education: Not on file   Highest education level: Not on file  Occupational History   Occupation: Holiday representative    Comment: [employer] fast streamer  Tobacco Use   Smoking status: Former Smoker    Packs/day: 1.50    Years: 15.00    Pack years: 22.50    Types: Cigarettes    Quit date: 04/20/1997    Years since quitting: 22.8   Smokeless tobacco: Never Used  Substance and Sexual Activity   Alcohol use: No   Drug use: No   Sexual activity: Not on  file  Other Topics Concern   Not on file  Social History Narrative   Not on file   Social Determinants of Health   Financial Resource Strain:    Difficulty of Paying Living Expenses:   Food Insecurity:    Worried About Programme researcher, broadcasting/film/video in the Last Year:    Barista in the Last Year:   Transportation Needs:    Freight forwarder (Medical):    Lack of Transportation (Non-Medical):   Physical Activity:    Days of Exercise per Week:    Minutes of Exercise per Session:   Stress:    Feeling of Stress :   Social Connections:    Frequency of Communication with Friends and Family:    Frequency of Social Gatherings with Friends and Family:    Attends Religious Services:    Active Member of Clubs or Organizations:     Attends Engineer, structural:    Marital Status:   Intimate Partner Violence:    Fear of Current or Ex-Partner:    Emotionally Abused:    Physically Abused:    Sexually Abused:     Family History  Problem Relation Age of Onset   CAD Father    Heart attack Brother     Anti-infectives: Anti-infectives (From admission, onward)   None      Current Outpatient Medications  Medication Sig Dispense Refill   aspirin EC 81 MG EC tablet Take 1 tablet (81 mg total) by mouth daily. 30 tablet 0   atorvastatin (LIPITOR) 80 MG tablet Take 1 tablet (80 mg total) by mouth daily at 6 PM. 90 tablet 3   clopidogrel (PLAVIX) 75 MG tablet Take 1 tablet (75 mg total) by mouth daily. 90 tablet 3   glimepiride (AMARYL) 1 MG tablet Take 1 mg by mouth daily with breakfast.      HYDROcodone-acetaminophen (NORCO) 10-325 MG tablet Take 1 tablet by mouth every 6 (six) hours as needed for moderate pain.   0   lisinopril (PRINIVIL,ZESTRIL) 5 MG tablet Take 1 tablet (5 mg total) by mouth daily. 90 tablet 3   nitroGLYCERIN (NITROSTAT) 0.4 MG SL tablet Place 1 tablet (0.4 mg total) under the tongue every 5 (five) minutes as needed for chest pain. For 3 doses only 30 tablet 0   No current facility-administered medications for this visit.     Objective: Vital signs in last 24 hours: BP (!) 155/61    Pulse (!) 56    Temp 97.7 F (36.5 C)    Ht 5\' 10"  (1.778 m)    Wt 195 lb (88.5 kg)    BMI 27.98 kg/m   I Physical Exam Vitals reviewed.  Constitutional:      Appearance: Normal appearance.  HENT:     Head: Normocephalic and atraumatic.  Cardiovascular:     Rate and Rhythm: Normal rate and regular rhythm.     Heart sounds: Normal heart sounds.  Pulmonary:     Effort: Pulmonary effort is normal. No respiratory distress.     Breath sounds: Normal breath sounds.  Abdominal:     General: Abdomen is flat.     Palpations: Abdomen is soft.     Tenderness: There is no abdominal  tenderness.     Hernia: No hernia is present.  Genitourinary:    Comments: circ phallus with mid penile plaque and no meatal stenosis. Scrotum, testes and epididymis normal. AP without lesions. NST without mass. Prostate 1.5+ with mild irregularity  at the right base that could be just the space between the prostate and SV's.  Neurological:     Mental Status: He is alert.     Lab Results:  Results for orders placed or performed in visit on 02/27/20 (from the past 24 hour(s))  POCT urinalysis dipstick     Status: Abnormal   Collection Time: 02/27/20 10:10 AM  Result Value Ref Range   Color, UA yellow    Clarity, UA vlear    Glucose, UA Negative Negative   Bilirubin, UA neg    Ketones, UA neg    Spec Grav, UA 1.015 1.010 - 1.025   Blood, UA neg    pH, UA 5.0 5.0 - 8.0   Protein, UA Negative Negative   Urobilinogen, UA negative (A) 0.2 or 1.0 E.U./dL   Nitrite, UA neg    Leukocytes, UA Negative Negative   Appearance clear    Odor      BMET No results for input(s): NA, K, CL, CO2, GLUCOSE, BUN, CREATININE, CALCIUM in the last 72 hours. PT/INR No results for input(s): LABPROT, INR in the last 72 hours. ABG No results for input(s): PHART, HCO3 in the last 72 hours.  Invalid input(s): PCO2, PO2  Studies/Results: PSA was 0.6 in 4/21.   I have reviewed his records and labs from his PCP and reviewed Epic records and imaging including his prior renal US.   Assessment/Plan: The prostate is small and slightly irregular probably because of the small size.  I don't really feel a nodule and his PSA is quite low so I don't think he needs a biopsy.   He just needs prn f/u.   He had some right groin pain a month or so ago but tthat has resolved.   No orders of the defined types were placed in this encounter.    Orders Placed This Encounter  Procedures   POCT urinalysis dipstick     Return if symptoms worsen or fail to improve.    CC: Dr. Lemmie Evens and Lenon Oms  NP.      Irine Seal 02/27/2020 [phone]

## 2020-02-27 NOTE — Progress Notes (Signed)
Urological Symptom Review  Patient is experiencing the following symptoms: Frequent urination Get up at night to urinate Weak stream Erection problems (male only)   Review of Systems  Gastrointestinal (upper)  : Negative for upper GI symptoms  Gastrointestinal (lower) : Negative for lower GI symptoms  Constitutional : Negative for symptoms  Skin: None  Eyes: Negative for eye symptoms  Ear/Nose/Throat : Negative for Ear/Nose/Throat symptoms  Hematologic/Lymphatic: Easy bruising  Cardiovascular : Negative for cardiovascular symptoms  Respiratory : Negative for respiratory symptoms  Endocrine: Negative for endocrine symptoms  Musculoskeletal: Joint pain  Neurological: Negative for neurological symptoms  Psychologic: Negative for psychiatric symptoms

## 2020-04-09 ENCOUNTER — Other Ambulatory Visit: Payer: Self-pay

## 2020-04-09 ENCOUNTER — Emergency Department (HOSPITAL_COMMUNITY): Payer: Medicare HMO

## 2020-04-09 ENCOUNTER — Encounter (HOSPITAL_COMMUNITY): Payer: Self-pay | Admitting: Emergency Medicine

## 2020-04-09 ENCOUNTER — Emergency Department (HOSPITAL_COMMUNITY)
Admission: EM | Admit: 2020-04-09 | Discharge: 2020-04-09 | Disposition: A | Discharge status: Home / Self Care | Payer: Medicare HMO | Attending: Emergency Medicine | Admitting: Emergency Medicine

## 2020-04-09 DIAGNOSIS — E119 Type 2 diabetes mellitus without complications: Secondary | ICD-10-CM | POA: Insufficient documentation

## 2020-04-09 DIAGNOSIS — Z7984 Long term (current) use of oral hypoglycemic drugs: Secondary | ICD-10-CM | POA: Insufficient documentation

## 2020-04-09 DIAGNOSIS — R002 Palpitations: Secondary | ICD-10-CM | POA: Insufficient documentation

## 2020-04-09 DIAGNOSIS — Z87891 Personal history of nicotine dependence: Secondary | ICD-10-CM | POA: Insufficient documentation

## 2020-04-09 DIAGNOSIS — Z79899 Other long term (current) drug therapy: Secondary | ICD-10-CM | POA: Insufficient documentation

## 2020-04-09 DIAGNOSIS — I2510 Atherosclerotic heart disease of native coronary artery without angina pectoris: Secondary | ICD-10-CM | POA: Insufficient documentation

## 2020-04-09 DIAGNOSIS — I10 Essential (primary) hypertension: Secondary | ICD-10-CM | POA: Diagnosis not present

## 2020-04-09 LAB — BASIC METABOLIC PANEL
Anion gap: 10 (ref 5–15)
BUN: 14 mg/dL (ref 8–23)
CO2: 24 mmol/L (ref 22–32)
Calcium: 9.1 mg/dL (ref 8.9–10.3)
Chloride: 102 mmol/L (ref 98–111)
Creatinine, Ser: 0.83 mg/dL (ref 0.61–1.24)
GFR calc Af Amer: >60 mL/min (ref >60)
GFR calc non Af Amer: >60 mL/min (ref >60)
Glucose, Bld: 92 mg/dL (ref 70–99)
Potassium: 3.7 mmol/L (ref 3.5–5.1)
Sodium: 136 mmol/L (ref 135–145)

## 2020-04-09 LAB — CBC
HCT: 45.9 % (ref 39.0–52.0)
Hemoglobin: 15.3 g/dL (ref 13.0–17.0)
MCH: 30.9 pg (ref 26.0–34.0)
MCHC: 33.3 g/dL (ref 30.0–36.0)
MCV: 92.7 fL (ref 80.0–100.0)
Platelets: 192 10*3/uL (ref 150–400)
RBC: 4.95 MIL/uL (ref 4.22–5.81)
RDW: 13.2 % (ref 11.5–15.5)
WBC: 8.3 10*3/uL (ref 4.0–10.5)
nRBC: 0 % (ref 0.0–0.2)

## 2020-04-09 LAB — TROPONIN I (HIGH SENSITIVITY)
Troponin I (High Sensitivity): 6 ng/L (ref <18)
Troponin I (High Sensitivity): 17 ng/L (ref <18)

## 2020-04-09 NOTE — ED Provider Notes (Signed)
[facility] EMERGENCY DEPARTMENT Provider Note   CSN: [id number] Arrival date & time: 04/09/20  1819     History Chief Complaint  Patient presents with  . Palpitations    Shawn Lopez is a 79 y.o. male with a history of CAD S/p multiple PCI, hypertension, hyperlipidemia, paroxysmal afib, & T2DM who presents to the ED with complaints of an episode of palpations that began around 5PM today. Patient states he was at rest working with his fishing gear when he felt a bit anxious with onset of palpitations, he subsequently checked his BP and noted it to be 172/115. Sxs persisted therefore he asked his friend to bring him to the ED. upon arrival symptoms seem to resolve, he is currently asymptomatic.  He estimates his symptoms lasted about an hour total..  There were no specific alleviating or aggravating factors.  No change with exertion.  He specifically denies any chest pain associated with this event to me despite triage note.  He states this does not feel like his prior MI which required stents.  He denies lightheadedness, dizziness, syncope, dyspnea, chest pain, nausea, vomiting, or leg pain/swelling.  HPI     Past Medical History:  Diagnosis Date  . Arthritis   . Atrial fibrillation (HCC) 2006   a. in 2006; not on anticoagulation, no recent atrial fib seen as of 2018.  . Borderline hypertension   . CAD (coronary artery disease)    a. NSTEMI 12/2016: cath showing 95% mid-LAD stenosis, 75% prox-LAD stenosis and 75% 1st Diag stenosis. DES to the proximal and mid-LAD along with the 1st Diagonal..  . Chronic back pain    Prior trauma with vertebral fracture  . Hyperlipidemia   . Hypertension   . Osteopenia   . Sinus bradycardia   . Type 2 diabetes mellitus [facility])     Patient Active Problem List   Diagnosis Date Noted  . 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 CAD (coronary artery disease) with PCI 12/2016 06/05/2017  . Chest pain 01/13/2017  . NSTEMI (non-ST elevated  myocardial infarction) (HCC) 01/13/2017  . Pain in joint, shoulder region 10/20/2013  . Muscle spasm of right shoulder 10/20/2013  . Right rotator cuff tear 10/14/2013  . 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 Atrial fibrillation (HCC)   . Essential hypertension   . Hyperlipidemia LDL goal <70   . Diabetes mellitus (HCC)   . Osteopenia   . Chronic back pain   . Chest pain     Past Surgical History:  Procedure Laterality Date  . APPENDECTOMY  1960  . CATARACT EXTRACTION  2012   right eye,lens implantation  . CHOLECYSTECTOMY    . CIRCUMCISION  2010   meatotomy and dilatation  . COLONOSCOPY N/A 01/14/2013   Procedure: COLONOSCOPY;  Surgeon: Dalia Heading, MD;  Location: [facility];  Service: Gastroenterology;  Laterality: N/A;  . CORONARY ANGIOGRAPHY N/A 01/15/2017   Procedure: Coronary Angiography;  Surgeon: Runell Gess, MD;  Location: [facility] INVASIVE CV LAB;  Service: Cardiovascular;  Laterality: N/A;  . CORONARY STENT INTERVENTION N/A 01/15/2017   Procedure: Coronary Stent Intervention;  Surgeon: Runell Gess, MD;  Location: [facility] INVASIVE CV LAB;  Service: Cardiovascular;  Laterality: N/A;  . INTRAVASCULAR PRESSURE WIRE/FFR STUDY N/A 04/04/2019   Procedure: INTRAVASCULAR PRESSURE WIRE/FFR STUDY;  Surgeon: Yvonne Kendall, MD;  Location: [facility] INVASIVE CV LAB;  Service: Cardiovascular;  Laterality: N/A;  . LEFT HEART CATH AND CORONARY ANGIOGRAPHY N/A 04/04/2019   Procedure: LEFT HEART CATH  AND CORONARY ANGIOGRAPHY;  Surgeon: Yvonne Kendall, MD;  Location: [facility] INVASIVE CV LAB;  Service: Cardiovascular;  Laterality: N/A;       Family History  Problem Relation Age of Onset  . CAD Father   . Heart attack Brother     Social History   Tobacco Use  . Smoking status: Former Smoker    Packs/day: 1.50    Years: 15.00    Pack years: 22.50    Types: Cigarettes    Quit date: 04/20/1997    Years since quitting: 22.9  . Smokeless tobacco: Never Used  Vaping Use  . Vaping Use:  Never used  Substance Use Topics  . Alcohol use: No  . Drug use: No    Home Medications Prior to Admission medications   Medication Sig Start Date End Date Taking? Authorizing Provider  aspirin EC 81 MG EC tablet Take 1 tablet (81 mg total) by mouth daily. 01/17/17   Ghimire, Werner Lean, MD  atorvastatin (LIPITOR) 80 MG tablet Take 1 tablet (80 mg total) by mouth daily at 6 PM. 03/09/17   Jodelle Gross, NP  clopidogrel (PLAVIX) 75 MG tablet Take 1 tablet (75 mg total) by mouth daily. 03/09/17   Jodelle Gross, NP  glimepiride (AMARYL) 1 MG tablet Take 1 mg by mouth daily with breakfast.  10/11/18   [provider]  HYDROcodone-acetaminophen (NORCO) 10-325 MG tablet Take 1 tablet by mouth every 6 (six) hours as needed for moderate pain.  08/02/17   [provider]  lisinopril (PRINIVIL,ZESTRIL) 5 MG tablet Take 1 tablet (5 mg total) by mouth daily. 03/09/17   Jodelle Gross, NP  nitroGLYCERIN (NITROSTAT) 0.4 MG SL tablet Place 1 tablet (0.4 mg total) under the tongue every 5 (five) minutes as needed for chest pain. For 3 doses only 01/16/17   Ghimire, Werner Lean, MD    Allergies    Brilinta [ticagrelor]  Review of Systems   Review of Systems  Constitutional: Negative for chills and fever.  Respiratory: Negative for cough and shortness of breath.   Cardiovascular: Positive for palpitations. Negative for chest pain and leg swelling.  Gastrointestinal: Negative for abdominal pain, nausea and vomiting.  Neurological: Negative for dizziness, syncope, weakness and light-headedness.  All other systems reviewed and are negative.   Physical Exam Updated Vital Signs BP (!) 155/61 (BP Location: Right Arm)   Pulse 76   Temp 98.3 F (36.8 C) (Oral)   Resp 18   Ht 5\' 10"  (1.778 m)   Wt 88.5 kg   SpO2 95%   BMI 27.98 kg/m   Physical Exam Vitals and nursing note reviewed.  Constitutional:      General: He is not in acute distress.    Appearance: He is  well-developed. He is not toxic-appearing.  HENT:     Head: Normocephalic and atraumatic.  Eyes:     General:        Right eye: No discharge.        Left eye: No discharge.     Conjunctiva/sclera: Conjunctivae normal.  Cardiovascular:     Rate and Rhythm: Normal rate and regular rhythm.     Comments: 2+ symmetric radial pulses. Pulmonary:     Effort: Pulmonary effort is normal. No respiratory distress.     Breath sounds: Normal breath sounds. No wheezing, rhonchi or rales.  Abdominal:     General: There is no distension.     Palpations: Abdomen is soft.     Tenderness: There  is no abdominal tenderness.  Musculoskeletal:     Cervical back: Neck supple.     Right lower leg: No edema.     Left lower leg: No edema.  Skin:    General: Skin is warm and dry.     Findings: No rash.  Neurological:     Mental Status: He is alert.     Comments: Clear speech.   Psychiatric:        Behavior: Behavior normal.     ED Results / Procedures / Treatments   Labs (all labs ordered are listed, but only abnormal results are displayed) Labs Reviewed  BASIC METABOLIC PANEL  CBC  TROPONIN I (HIGH SENSITIVITY)  TROPONIN I (HIGH SENSITIVITY)    EKG EKG Interpretation  Date/Time:  Friday April 09 2020 18:47:31 EDT Ventricular Rate:  59 PR Interval:  164 QRS Duration: 102 QT Interval:  436 QTC Calculation: 431 R Axis:   29 Text Interpretation: Sinus bradycardia Possible Inferior infarct , age undetermined Abnormal ECG Confirmed by Benjiman CorePickering, Nathan [phone]([id number]) on 04/09/2020 11:13:24 PM   Radiology DG Chest 2 View  Result Date: 04/09/2020 CLINICAL DATA:  Chest pain. EXAM: CHEST - 2 VIEW COMPARISON:  06/11/2019, frontal and lateral views 02/10/2018 FINDINGS: Stable upper normal heart size. Unchanged mediastinal contours with aortic atherosclerosis. Pulmonary vasculature is normal. No consolidation, pleural effusion, or pneumothorax. No acute osseous abnormalities are seen. IMPRESSION: No acute  chest findings. Electronically Signed   By: Narda RutherfordMelanie  Sanford M.D.   On: 04/09/2020 19:42    Procedures Procedures (including critical care time)  Medications Ordered in ED Medications - No data to display  ED Course  I have reviewed the triage vital signs and the nursing notes.  Pertinent labs & imaging results that were available during my care of the patient were reviewed by me and considered in my medical decision making (see chart for details).    MDM Rules/Calculators/A&P                         Patient presents to the ED with complaints of an episode of palpitations with elevated blood pressure with symptoms resolved upon arrival.  Patient is nontoxic, resting comfortably, his BP is mildly elevated, suspicion for hypertensive emergency is fairly low.  He has a benign physical exam. DDx: Arrhythmia, anxiety, atypical ACS, pulmonary embolism, critical anemia, electrolyte derangement.    Additional history obtained:  Additional history obtained from patient's friend at bedside who confirms timing of history. Previous records obtained and reviewed.  Most recent L heart cat 04/04/19 by Dr. Okey DupreEnd:  Conclusions: 1. Mild to moderate, non-obstructive coronary artery disease, as detailed below.  Long segment of three overlapping stents in the proximal through distal LAD is patent with up to 50% in-stent restenosis in the mid section that is not hemodynamically significant at this time with a DFR of 0.90-0.91 (cut point for significance is 0.89). 2. Widely patent stent in D1 with 50% focal disease proximal to the stent, which is not hemodynamically significant (DFR 0.95). 3. Normal left ventricular systolic function with mildly elevated filling pressure suggestive of diastolic dysfunction. 4. Sinus bradycardia with heart rates of 40-50 bpm noted throughout most of the case.  Recommendations: 1. Continue aggressive medical therapy, including indefinite dual antiplatelet therapy with aspirin  and clopidogrel given long stented segment of the LAD. 2. Question if chronotropic incompetence is contributing to symptoms; I will decrease metoprolol tartrate to 12.5 twice daily. 3. Add amlodipine 2.5 mg  daily for improved blood pressure control and antianginal therapy, as Mr. Kelnhofer has been intolerant of isosorbide mononitrate.  EKG: no STEMI Lab Tests:  I reviewed and interpreted labs, which included:  CBC: No anemia or leukocytosis. BMP: No significant electrolyte derangement Troponin: 6, 17 Imaging Studies ordered:  I ordered imaging studies which included CXR, I independently visualized and interpreted imaging which showed no acute process.   Patient without any chest pain reported to me, only symptom was palpitations with some mild anxiety, his 2nd troponin is mildly more elevated than his first but still remains without significant elevation, his HEAR score is a 4, he is asking me to go home & remains asymptomatic in the ED, EKG without STEMI, low suspicion for ACS.Marland Kitchen  He is low risk Wells, doubt pulmonary embolism.  His labs are reassuring.  His chest x-ray without acute process.  Cardiac monitor has been reviewed without significant arrhythmia while observed in the emergency department.  Difficult to determine definitive etiology of patient's symptoms, it is possible that he had an arrhythmia prior to arrival, but remains asymptomatic here.  He feels well and would like to go home.  Will discharge home with close cardiology follow-up and strict return precautions. I discussed results, treatment plan, need for follow-up, and return precautions with the patient. Provided opportunity for questions, patient confirmed understanding and is in agreement with plan.   Findings and plan of care discussed with supervising physician Dr. Rubin Payor who is in agreement.   Portions of this note were generated with Scientist, clinical (histocompatibility and immunogenetics). Dictation errors may occur despite best attempts at  proofreading.  Final Clinical Impression(s) / ED Diagnoses Final diagnoses:  Palpitations    Rx / DC Orders ED Discharge Orders    None       Desmond Lope 04/09/20 2322    Benjiman Core, MD 04/09/20 2350

## 2020-04-09 NOTE — Discharge Instructions (Addendum)
You were seen in the emergency department today for palpitations. Your work-up in the emergency department has been overall reassuring. Your labs have been fairly normal and or similar to previous blood work you have had done. Your EKG and the enzyme we use to check your heart did not show  findings that you are having an acute heart attack at this time. Your chest x-ray was normal.   We would like you to follow up closely with your primary care provider and/or the cardiologist provided in your discharge instructions within 1-3 days. Return to the ER immediately should you experience any new or worsening symptoms including but not limited to chest pain, nausea, vomiting, feeling your heart is beating fast/funny, vomiting, shortness of breath, dizziness, lightheadedness, passing out, or any other concerns that you may have.

## 2020-04-09 NOTE — ED Triage Notes (Signed)
Pt reports palpitations, hypertension this afternoon. Pt reports burning sensation in left chest. nad noted. Pt reports history of stent placement.

## 2020-04-16 ENCOUNTER — Other Ambulatory Visit: Payer: Self-pay

## 2020-04-16 ENCOUNTER — Encounter: Payer: Self-pay | Admitting: Family Medicine

## 2020-04-16 ENCOUNTER — Ambulatory Visit: Payer: Medicare HMO | Admitting: Family Medicine

## 2020-04-16 VITALS — BP 142/70 | HR 50 | Ht 71.0 in | Wt 195.8 lb

## 2020-04-16 DIAGNOSIS — I2510 Atherosclerotic heart disease of native coronary artery without angina pectoris: Secondary | ICD-10-CM | POA: Diagnosis not present

## 2020-04-16 DIAGNOSIS — Z87898 Personal history of other specified conditions: Secondary | ICD-10-CM

## 2020-04-16 DIAGNOSIS — R0609 Other forms of dyspnea: Secondary | ICD-10-CM

## 2020-04-16 DIAGNOSIS — E785 Hyperlipidemia, unspecified: Secondary | ICD-10-CM

## 2020-04-16 DIAGNOSIS — I10 Essential (primary) hypertension: Secondary | ICD-10-CM

## 2020-04-16 DIAGNOSIS — R0600 Dyspnea, unspecified: Secondary | ICD-10-CM

## 2020-04-16 MED ORDER — LISINOPRIL 10 MG PO TABS: 10.0000 mg | ORAL_TABLET | Freq: Every day | ORAL | 6 refills | Status: DC

## 2020-04-16 NOTE — Patient Instructions (Signed)
Medication Instructions:   Increase Lisinopril to 10mg  daily.  Continue all other medications.    Labwork:  BMET, CBC - orders given today.   Please do in 2 weeks.  Office will contact with results via phone or letter.    Testing/Procedures: none  Follow-Up: 6 months   Any Other Special Instructions Will Be Listed Below (If Applicable).  If you need a refill on your cardiac medications before your next appointment, please call your pharmacy.

## 2020-04-16 NOTE — Progress Notes (Signed)
Cardiology Office Note  Date: 04/16/2020   ID: Shawn Lopez 23-Feb-1941, MRN [id number]  PCP:  [name], MD  Cardiologist:  [name], MD Electrophysiologist:  None   Chief Complaint: CAD  History of Present Illness: Shawn Lopez is a 79 y.o. male with a history of CAD (status post DES to the proximal mid LAD, diagonal April 2018) patent by cardiac cath 2020.  Last saw Dr. Diona Lopez 01/26/2020.  He studied felt the best he had in quite a while over the last 3 months.  He had stopped his Norvasc to the dizziness.  He did not report any palpitations or syncope, or anginal symptoms or nitroglycerin use.  Cardiac monitor showed rare PACs and PVCs, brief episodes of SVT, no sustained arrhythmias.  His beta-blocker had been stopped in the past due to symptomatic bradycardia.  He remains on Lipitor.  FLP's and LFTs were ordered for next visit.  Had no active palpitations at the time.  Would not start him on AV nodal blockers given history of bradycardia.  No sustained arrhythmias.  Recent visit to [facility] emergency department on 716 with complaints of an episode of palpitations that began around 5 PM that day.  States he was at rest working with his fishing gear when he felt a bit anxious with onset of palpitations.  Subsequently checked his blood pressure noted to be 172/115.  Upon arrival to the emergency room symptoms had resolved and he was asymptomatic.  States his symptoms lasted approximately an hour.  He denied any associated lightheadedness, dizziness, syncope, dyspnea, chest pain, nausea, vomiting, leg pain or swelling.  Lab work was essentially unremarkable he had to negative troponins.  Chest x-ray normal, CBC normal, sodium 131, glucose 142, calcium 8.4      Past Medical History:  Diagnosis Date  . Arthritis   . Atrial fibrillation (HCC) 2006   a. in 2006; not on anticoagulation, no recent atrial fib seen as of 2018.  . Borderline hypertension   .  CAD (coronary artery disease)    a. NSTEMI 12/2016: cath showing 95% mid-LAD stenosis, 75% prox-LAD stenosis and 75% 1st Diag stenosis. DES to the proximal and mid-LAD along with the 1st Diagonal..  . Chronic back pain    Prior trauma with vertebral fracture  . Hyperlipidemia   . Hypertension   . Osteopenia   . Sinus bradycardia   . Type 2 diabetes mellitus (HCC)     Past Surgical History:  Procedure Laterality Date  . APPENDECTOMY  1960  . CATARACT EXTRACTION  2012   right eye,lens implantation  . CHOLECYSTECTOMY    . CIRCUMCISION  2010   meatotomy and dilatation  . COLONOSCOPY N/A 01/14/2013   Procedure: COLONOSCOPY;  Surgeon: Dalia Heading, MD;  [facility];  Service: Gastroenterology;  Laterality: N/A;  . CORONARY ANGIOGRAPHY N/A 01/15/2017   Procedure: Coronary Angiography;  Surgeon: Runell Gess, MD;  Location: [facility] INVASIVE CV LAB;  Service: Cardiovascular;  Laterality: N/A;  . CORONARY STENT INTERVENTION N/A 01/15/2017   Procedure: Coronary Stent Intervention;  Surgeon: Runell Gess, MD;  Location: [facility] INVASIVE CV LAB;  Service: Cardiovascular;  Laterality: N/A;  . INTRAVASCULAR PRESSURE WIRE/FFR STUDY N/A 04/04/2019   Procedure: INTRAVASCULAR PRESSURE WIRE/FFR STUDY;  Surgeon: Yvonne Kendall, MD;  Location: [facility] INVASIVE CV LAB;  Service: Cardiovascular;  Laterality: N/A;  . LEFT HEART CATH AND CORONARY ANGIOGRAPHY N/A 04/04/2019   Procedure: LEFT HEART CATH AND CORONARY ANGIOGRAPHY;  Surgeon:  End, Cristal Deer, MD;  Location: [facility] INVASIVE CV LAB;  Service: Cardiovascular;  Laterality: N/A;    Current Outpatient Medications  Medication Sig Dispense Refill  . aspirin EC 81 MG EC tablet Take 1 tablet (81 mg total) by mouth daily. 30 tablet 0  . atorvastatin (LIPITOR) 80 MG tablet Take 1 tablet (80 mg total) by mouth daily at 6 PM. 90 tablet 3  . clopidogrel (PLAVIX) 75 MG tablet Take 1 tablet (75 mg total) by mouth daily. 90 tablet 3  . glimepiride (AMARYL) 1 MG  tablet Take 1 mg by mouth daily with breakfast.     . HYDROcodone-acetaminophen (NORCO) 10-325 MG tablet Take 1 tablet by mouth every 6 (six) hours as needed for moderate pain.   0  . lisinopril (ZESTRIL) 10 MG tablet Take 1 tablet (10 mg total) by mouth daily. 30 tablet 6  . nitroGLYCERIN (NITROSTAT) 0.4 MG SL tablet Place 1 tablet (0.4 mg total) under the tongue every 5 (five) minutes as needed for chest pain. For 3 doses only 30 tablet 0   No current facility-administered medications for this visit.   Allergies:  Brilinta [ticagrelor]   Social History: The patient  reports that he quit smoking about 23 years ago. His smoking use included cigarettes. He has a 22.50 pack-year smoking history. He has never used smokeless tobacco. He reports that he does not drink alcohol and does not use drugs.   Family History: The patient's family history includes CAD in his father; Heart attack in his brother.   ROS:  Please see the history of present illness. Otherwise, complete review of systems is positive for none.  All other systems are reviewed and negative.   Physical Exam: VS:  BP (!) 142/70   Pulse 50   Ht 5\' 11"  (1.803 m)   Wt 195 lb 12.8 oz (88.8 kg)   SpO2 95%   BMI 27.31 kg/m , BMI Body mass index is 27.31 kg/m.  Wt Readings from Last 3 Encounters:  04/16/20 195 lb 12.8 oz (88.8 kg)  04/09/20 195 lb (88.5 kg)  02/27/20 195 lb (88.5 kg)    General: Patient appears comfortable at rest. Neck: Supple, no elevated JVP or carotid bruits, no thyromegaly. Lungs: Clear to auscultation, nonlabored breathing at rest. Cardiac: Regular rate and rhythm, no S3 or significant systolic murmur, no pericardial rub. Extremities: No pitting edema, distal pulses 2+. Skin: Warm and dry. Musculoskeletal: No kyphosis. Neuropsychiatric: Alert and oriented x3, affect grossly appropriate.  ECG:  EKG April 09, 2020 showed sinus bradycardia with a rate of 59, possible inferior infarct, age undetermined.    Recent Labwork: 06/11/2019: ALT 92; AST 97; TSH 1.724 04/09/2020: BUN 14; Creatinine, Ser 0.83; Hemoglobin 15.3; Platelets 192; Potassium 3.7; Sodium 136     Component Value Date/Time   CHOL 123 01/15/2017 0702   TRIG 158 (H) 01/15/2017 0702   HDL 36 (L) 01/15/2017 0702   CHOLHDL 3.4 01/15/2017 0702   VLDL 32 01/15/2017 0702   LDLCALC 55 01/15/2017 0702    Other Studies Reviewed Today:  Cardiac catheterization 04/04/2019: Conclusions: 1. Mild to moderate, non-obstructive coronary artery disease, as detailed below. Long segment of three overlapping stents in the proximal through distal LAD is patent with up to 50% in-stent restenosis in the mid section that is not hemodynamically significant at this time with a DFR of 0.90-0.91 (cut point for significance is 0.89). 2. Widely patent stent in D1 with 50% focal disease proximal to the stent, which is not  hemodynamically significant (DFR 0.95). 3. Normal left ventricular systolic function with mildly elevated filling pressure suggestive of diastolic dysfunction. 4. Sinus bradycardia with heart rates of 40-50 bpm noted throughout most of the case.  Recommendations: 1. Continue aggressive medical therapy, including indefinite dual antiplatelet therapy with aspirin and clopidogrel given long stented segment of the LAD. 2. Question if chronotropic incompetence is contributing to symptoms; I will decrease metoprolol tartrate to 12.5 twice daily. Add amlodipine 2.5 mg daily for improved blood pressure control and antianginal therapy, as Mr. Kines has been intolerant of isosorbide mononitrate.  Echocardiogram 01/14/2017 Study Conclusions   - Left ventricle: The cavity size was normal. There was mild focal  basal hypertrophy of the septum. Systolic function was normal.  The estimated ejection fraction was in the range of 55% to 60%.  Wall motion was normal; there were no regional wall motion  abnormalities. Doppler parameters are  consistent with abnormal  left ventricular relaxation (grade 1 diastolic dysfunction).  Doppler parameters are consistent with high ventricular filling  pressure.  - Aortic valve: There was trivial regurgitation.  - Mitral valve: Calcified annulus. Mildly thickened leaflets .  - Left atrium: The atrium was moderately dilated.  - Atrial septum: There was increased thickness of the septum,  consistent with lipomatous hypertrophy.  - Pulmonary arteries: Systolic pressure was mildly increased. PA  peak pressure: 32 mm Hg (S).   Impressions:   - Normal LV systolic function; grade 1 diastolic dysfunction with  elevated LV filling pressure; trace AI; moderate LAE; mild TR  with mildly elevated pulmonary pressure.   Assessment and Plan:  1. Dyspnea on exertion   2. CAD in native artery   3. Hyperlipidemia LDL goal <70   4. History of palpitations   5. Essential hypertension     1. Dyspnea on exertion Currently denies any issues with dyspnea on exertion.  States he works a full-time job currently without any symptoms.  2. CAD in native artery  CAD (status post DES to the proximal mid LAD, diagonal April 2018) patent by cardiac cath 2020.  Denies any anginal or exertional symptoms.  Continue aspirin 81 mg, nitroglycerin sublingual as needed for chest pain, Plavix 75 mg.    3. Hyperlipidemia LDL goal <70 Continue atorvastatin 80 mg daily.  Last lipid panel 420 11/30/2016 total cholesterol 123, triglycerides 158, HDL 36, LDL 55.  Patient has an active order for lipid profile entered on 01/26/2020  4. History of palpitations No subsequent palpitations since recent emergency room visit on 04/09/2020 for palpitations.  5.  Essential hypertension. He states his systolic blood pressures have been running anywhere from the 140s to 170s.  Increase lisinopril to 10 mg daily.  Get a BMP and magnesium in 2 weeks.  Medication Adjustments/Labs and Tests Ordered: Current medicines are  reviewed at length with the patient today.  Concerns regarding medicines are outlined above.   Disposition: Follow-up with Dr. Diona Lopez or APP 6 months  Signed, Rennis Harding, NP 04/16/2020 9:52 AM    [facility] [address] [place], [place], [place] [address] Phone: [phone]; Fax: [phone]

## 2020-04-19 ENCOUNTER — Other Ambulatory Visit: Payer: Self-pay | Admitting: Family Medicine

## 2020-04-19 DIAGNOSIS — M25511 Pain in right shoulder: Secondary | ICD-10-CM

## 2020-05-03 ENCOUNTER — Emergency Department (HOSPITAL_COMMUNITY)
Admission: EM | Admit: 2020-05-03 | Discharge: 2020-05-03 | Disposition: A | Discharge status: Home / Self Care | Payer: Medicare HMO | Attending: Emergency Medicine | Admitting: Emergency Medicine

## 2020-05-03 ENCOUNTER — Encounter (HOSPITAL_COMMUNITY): Payer: Self-pay | Admitting: *Deleted

## 2020-05-03 ENCOUNTER — Other Ambulatory Visit: Payer: Self-pay

## 2020-05-03 ENCOUNTER — Emergency Department (HOSPITAL_COMMUNITY): Payer: Medicare HMO

## 2020-05-03 DIAGNOSIS — I10 Essential (primary) hypertension: Secondary | ICD-10-CM | POA: Insufficient documentation

## 2020-05-03 DIAGNOSIS — R55 Syncope and collapse: Secondary | ICD-10-CM | POA: Diagnosis not present

## 2020-05-03 DIAGNOSIS — R072 Precordial pain: Secondary | ICD-10-CM | POA: Diagnosis not present

## 2020-05-03 DIAGNOSIS — E119 Type 2 diabetes mellitus without complications: Secondary | ICD-10-CM | POA: Insufficient documentation

## 2020-05-03 DIAGNOSIS — H81399 Other peripheral vertigo, unspecified ear: Secondary | ICD-10-CM | POA: Diagnosis not present

## 2020-05-03 DIAGNOSIS — Z87891 Personal history of nicotine dependence: Secondary | ICD-10-CM | POA: Diagnosis not present

## 2020-05-03 DIAGNOSIS — I2510 Atherosclerotic heart disease of native coronary artery without angina pectoris: Secondary | ICD-10-CM | POA: Diagnosis not present

## 2020-05-03 LAB — BASIC METABOLIC PANEL
Anion gap: 6 (ref 5–15)
BUN: 17 mg/dL (ref 8–23)
CO2: 23 mmol/L (ref 22–32)
Calcium: 9.3 mg/dL (ref 8.9–10.3)
Chloride: 105 mmol/L (ref 98–111)
Creatinine, Ser: 1.01 mg/dL (ref 0.61–1.24)
GFR calc Af Amer: >60 mL/min (ref >60)
GFR calc non Af Amer: >60 mL/min (ref >60)
Glucose, Bld: 183 mg/dL — ABNORMAL HIGH (ref 70–99)
Potassium: 3.9 mmol/L (ref 3.5–5.1)
Sodium: 134 mmol/L — ABNORMAL LOW (ref 135–145)

## 2020-05-03 LAB — CBC
HCT: 47.3 % (ref 39.0–52.0)
Hemoglobin: 15.7 g/dL (ref 13.0–17.0)
MCH: 31 pg (ref 26.0–34.0)
MCHC: 33.2 g/dL (ref 30.0–36.0)
MCV: 93.3 fL (ref 80.0–100.0)
Platelets: 186 10*3/uL (ref 150–400)
RBC: 5.07 MIL/uL (ref 4.22–5.81)
RDW: 13.2 % (ref 11.5–15.5)
WBC: 7.1 10*3/uL (ref 4.0–10.5)
nRBC: 0 % (ref 0.0–0.2)

## 2020-05-03 LAB — TROPONIN I (HIGH SENSITIVITY)
Troponin I (High Sensitivity): 5 ng/L (ref <18)
Troponin I (High Sensitivity): 6 ng/L (ref <18)

## 2020-05-03 MED ORDER — MECLIZINE HCL 12.5 MG PO TABS
12.5000 mg | ORAL_TABLET | Freq: Three times a day (TID) | ORAL | 0 refills | Status: DC | PRN
Start: 2020-05-03 — End: 2021-10-13

## 2020-05-03 MED ORDER — SODIUM CHLORIDE 0.9% FLUSH: 3.0000 mL | Freq: Once | INTRAVENOUS | Status: DC

## 2020-05-03 MED ORDER — SODIUM CHLORIDE 0.9 % IV BOLUS
500.0000 mL | Freq: Once | INTRAVENOUS | Status: AC
  Administered 2020-05-03: 500 mL via INTRAVENOUS

## 2020-05-03 NOTE — ED Provider Notes (Signed)
Emergency Department Provider Note   I have reviewed the triage vital signs and the nursing notes.   HISTORY  Chief Complaint Near Syncope   HPI Shawn Lopez is a 79 y.o. male with PMH reviewed below presents to the emergency department for evaluation of near syncope while at work today.  Patient was squatting down when he stood up felt very lightheaded and some associated vertigo type symptoms.  He states the room seem to be moving and spinning.  This caused him to fall to his bottom but did not strike his head.  Patient denies fully losing consciousness.  Immediately after falling he had some pressure in his chest which resolved after 1 to 2 minutes.  He did not feel short of breath.  He did report some associated heart palpitations but those have also resolved.  He states has been feeling overall well.  Denies any recent medication changes, vomiting/diarrhea, fevers, or URI symptoms.  Patient has had CAD in the past with stenting.  He states that time he was having primarily left arm pain which is not what he experienced today. No tinnitus. No ear pain or HA.    Past Medical History:  Diagnosis Date  . Arthritis   . Atrial fibrillation (HCC) 2006   a. in 2006; not on anticoagulation, no recent atrial fib seen as of 2018.  . Borderline hypertension   . CAD (coronary artery disease)    a. NSTEMI 12/2016: cath showing 95% mid-LAD stenosis, 75% prox-LAD stenosis and 75% 1st Diag stenosis. DES to the proximal and mid-LAD along with the 1st Diagonal..  . Chronic back pain    Prior trauma with vertebral fracture  . Hyperlipidemia   . Hypertension   . Osteopenia   . Sinus bradycardia   . Type 2 diabetes mellitus [facility])     Patient Active Problem List   Diagnosis Date Noted  . 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 CAD (coronary artery disease) with PCI 12/2016 06/05/2017  . Chest pain 01/13/2017  . NSTEMI (non-ST elevated myocardial infarction) (HCC) 01/13/2017  . Pain in  joint, shoulder region 10/20/2013  . Muscle spasm of right shoulder 10/20/2013  . Right rotator cuff tear 10/14/2013  . 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 Atrial fibrillation (HCC)   . Essential hypertension   . Hyperlipidemia LDL goal <70   . Diabetes mellitus (HCC)   . Osteopenia   . Chronic back pain   . Chest pain     Past Surgical History:  Procedure Laterality Date  . APPENDECTOMY  1960  . CATARACT EXTRACTION  2012   right eye,lens implantation  . CHOLECYSTECTOMY    . CIRCUMCISION  2010   meatotomy and dilatation  . COLONOSCOPY N/A 01/14/2013   Procedure: COLONOSCOPY;  Surgeon: Dalia Heading, MD;  Location[facility];  Service: Gastroenterology;  Laterality: N/A;  . CORONARY ANGIOGRAPHY N/A 01/15/2017   Procedure: Coronary Angiography;  Surgeon: Runell Gess, MD;  Location: [facility] INVASIVE CV LAB;  Service: Cardiovascular;  Laterality: N/A;  . CORONARY STENT INTERVENTION N/A 01/15/2017   Procedure: Coronary Stent Intervention;  Surgeon: Runell Gess, MD;  Location: [facility] INVASIVE CV LAB;  Service: Cardiovascular;  Laterality: N/A;  . INTRAVASCULAR PRESSURE WIRE/FFR STUDY N/A 04/04/2019   Procedure: INTRAVASCULAR PRESSURE WIRE/FFR STUDY;  Surgeon: Yvonne Kendall, MD;  Location: [facility] INVASIVE CV LAB;  Service: Cardiovascular;  Laterality: N/A;  . LEFT HEART CATH AND CORONARY ANGIOGRAPHY N/A 04/04/2019   Procedure: LEFT HEART CATH  AND CORONARY ANGIOGRAPHY;  Surgeon: Yvonne Kendall, MD;  Location: [facility] INVASIVE CV LAB;  Service: Cardiovascular;  Laterality: N/A;    Allergies Brilinta [ticagrelor]  Family History  Problem Relation Age of Onset  . CAD Father   . Heart attack Brother     Social History Social History   Tobacco Use  . Smoking status: Former Smoker    Packs/day: 1.50    Years: 15.00    Pack years: 22.50    Types: Cigarettes    Quit date: 04/20/1997    Years since quitting: 23.0  . Smokeless tobacco: Never Used  Vaping Use  . Vaping Use:  Never used  Substance Use Topics  . Alcohol use: No  . Drug use: No    Review of Systems  Constitutional: No fever/chills Eyes: No visual changes. ENT: No sore throat. Positive vertigo.  Cardiovascular: Positive chest pain and palpitations.  Respiratory: Denies shortness of breath. Gastrointestinal: No abdominal pain.  No nausea, no vomiting.  No diarrhea.  No constipation. Genitourinary: Negative for dysuria. Musculoskeletal: Negative for back pain. Skin: Negative for rash. Neurological: Negative for headaches, focal weakness or numbness.  10-point ROS otherwise negative.  ____________________________________________   PHYSICAL EXAM:  VITAL SIGNS: ED Triage Vitals  Enc Vitals Group     BP 05/03/20 1123 (!) 173/82     Pulse Rate 05/03/20 1123 63     Resp 05/03/20 1123 20     Temp 05/03/20 1123 98.2 F (36.8 C)     Temp Source 05/03/20 1123 Oral     SpO2 05/03/20 1123 95 %     Weight 05/03/20 1125 195 lb (88.5 kg)     Height 05/03/20 1125 5\' 10"  (1.778 m)   Constitutional: Alert and oriented. Well appearing and in no acute distress. Eyes: Conjunctivae are normal. No nystagmus.  Head: Atraumatic. Nose: No congestion/rhinnorhea. Mouth/Throat: Mucous membranes are moist.   Neck: No stridor.   Cardiovascular: Normal rate, regular rhythm. Good peripheral circulation. Grossly normal heart sounds.   Respiratory: Normal respiratory effort.  No retractions. Lungs CTAB. Gastrointestinal: Soft and nontender. No distention.  Musculoskeletal: No lower extremity tenderness nor edema. No gross deformities of extremities. Neurologic:  Normal speech and language. No gross focal neurologic deficits are appreciated. No facial asymmetry. Normal sensation in the face, upper, and lower extremities. 5/5 strength in the bilateral upper and lower extremities. Normal finger to nose testing.  Skin:  Skin is warm, dry and intact. No rash noted.  ____________________________________________     LABS (all labs ordered are listed, but only abnormal results are displayed)  Labs Reviewed  BASIC METABOLIC PANEL - Abnormal; Notable for the following components:      Result Value   Sodium 134 (*)    Glucose, Bld 183 (*)    All other components within normal limits  CBC  TROPONIN I (HIGH SENSITIVITY)  TROPONIN I (HIGH SENSITIVITY)   ____________________________________________  EKG   EKG Interpretation  Date/Time:  Monday May 03 2020 11:32:50 EDT Ventricular Rate:  59 PR Interval:    QRS Duration: 109 QT Interval:  417 QTC Calculation: 414 R Axis:   74 Text Interpretation: Sinus rhythm Inferior infarct, old Lateral leads are also involved Similar to July tracing No STEMI Confirmed by August [phone]) on 05/03/2020 11:39:48 AM       ____________________________________________  RADIOLOGY  DG Chest 2 View  Result Date: 05/03/2020 CLINICAL DATA:  Chest pain with syncope EXAM: CHEST - 2 VIEW COMPARISON:  04/09/2020 FINDINGS: No  focal consolidation. No pleural effusion or pneumothorax. Heart and mediastinal contours are unremarkable. No acute osseous abnormality. IMPRESSION: No active cardiopulmonary disease. Electronically Signed   By: Elige Ko   On: 05/03/2020 12:24    ____________________________________________   PROCEDURES  Procedure(s) performed:   Procedures  None ____________________________________________   INITIAL IMPRESSION / ASSESSMENT AND PLAN / ED COURSE  Pertinent labs & imaging results that were available during my care of the patient were reviewed by me and considered in my medical decision making (see chart for details).   Patient presents to the emergency department with lightheadedness and mild vertigo with standing.  He developed some chest tightness after the event which has since resolved.  Patient is overall feeling well currently.  His vitals here show elevated blood pressure.  Patient felt asymptomatic during orthostatic  vitals.  EKG shows no acute ischemic changes.  Initial troponin is negative.  Plan on second troponin with additional telemetry monitoring in the ED.  Patient will likely require cardiology follow-up.  In review of the prior notes he has had ambulatory monitoring in the past with heart palpitations but no arrhythmia was found at that time.   Labs and imaging reviewed. Will refer back to Cardiology with near syncope type symptoms to further evaluate for cardiogenic syncope. No abnormality on tele here in the ED.   I reviewed all nursing notes, vitals, pertinent old records, EKGs, labs, imaging (as available).  ____________________________________________  FINAL CLINICAL IMPRESSION(S) / ED DIAGNOSES  Final diagnoses:  Near syncope  Peripheral vertigo, unspecified laterality  Precordial chest pain     MEDICATIONS GIVEN DURING THIS VISIT:  Medications  sodium chloride 0.9 % bolus 500 mL (0 mLs Intravenous Stopped 05/03/20 1311)     NEW OUTPATIENT MEDICATIONS STARTED DURING THIS VISIT:  Discharge Medication List as of 05/03/2020  2:28 PM    START taking these medications   Details  meclizine (ANTIVERT) 12.5 MG tablet Take 1 tablet (12.5 mg total) by mouth 3 (three) times daily as needed for dizziness., Starting Mon 05/03/2020, Normal        Note:  This document was prepared using Dragon voice recognition software and may include unintentional dictation errors.  Alona Bene, MD, [facility] Emergency Medicine    Kaelin Bonelli, Arlyss Repress, MD 05/05/20 1929

## 2020-05-03 NOTE — Discharge Instructions (Signed)
You were seen in the emergency room today with lightheadedness, vertigo, chest discomfort.  Your lab work is reassuring and I am sending you home with some medication for vertigo.  Please call your primary care doctor this afternoon to schedule a follow-up appointment in the coming week.  Have also placed a referral to the cardiology group locally.  Please call their office to schedule the next available follow-up appointment to discuss your racing heartbeats and almost passing out today.  If he develop new or suddenly worsening symptoms please call 911 or return to the emergency department immediately.

## 2020-05-03 NOTE — ED Triage Notes (Signed)
Chest pain with syncope

## 2020-05-13 NOTE — Progress Notes (Addendum)
Cardiology Office Note  Date: 05/14/2020   ID: Orlyn, Shawn Lopez 02-13-1941, MRN [id number]  PCP:  [name], MD  Cardiologist:  [name], MD Electrophysiologist:  None   Chief Complaint: CAD  History of Present Illness: Shawn Lopez is a 79 y.o. male with a history of CAD (status post DES to the proximal mid LAD, diagonal April 2018) patent by cardiac cath 2020.  Last saw Dr. Diona Lopez 01/26/2020.  He studied felt the best he had in quite a while over the last 3 months.  He had stopped his Norvasc to the dizziness.  He did not report any palpitations or syncope, or anginal symptoms or nitroglycerin use.  Cardiac monitor showed rare PACs and PVCs, brief episodes of SVT, no sustained arrhythmias.  His beta-blocker had been stopped in the past due to symptomatic bradycardia.  He remains on Lipitor.  FLP's and LFTs were ordered for next visit.  Had no active palpitations at the time.  Would not start him on AV nodal blockers given history of bradycardia.  No sustained arrhythmias.  Recent visit to [facility] emergency department on 7/16/2021with complaints of an episode of palpitations that began around 5 PM that day.  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  Subsequently checked his blood pressure noted to be 172/115.  Upon arrival to the emergency room symptoms had resolved and he was asymptomatic.  Stated his symptoms lasted approximately an hour.  He denied any associated lightheadedness, dizziness, syncope, dyspnea, chest pain, nausea, vomiting, leg pain or swelling.  Lab work was essentially unremarkable he had to negative troponins.  Chest x-ray normal, CBC normal, sodium 131, glucose 142, calcium 8.4   Recent [facility] ED 05/03/2020 visit for near syncope while at work. Was squatting down and upon standing he felt very lightheaded with sensation of room spinning. This caused him to fall on his buttocks. He  did not fully lose consciousness. Had some chest pressure which resolved after 1-2 minutes. Reported some associated palpitations which resolved. Referred back to cardiology to evaluate for cardiogenic syncope.  Patient is here status post recent hospital visit for syncope while at work.  Patient states he was working around a [employer] and stood up and felt dizzy.  States he fell and became sweaty.  States he did feel some palpitations prior to the episode.  States he has had palpitations in the past and had a monitor which he he stated did not show any significant arrhythmias.  He states he had been on a beta-blocker metoprolol in the past and was taken off of it.  He states he is unsure as to why the metoprolol was stopped.  He states he inherently has a slow heart rate.  He states in the past it has been in the mid 40s and he tolerates the rate.  He denies any subsequent episodes since this recent occurrence at work.  He denies any orthostatic symptoms, CVA or TIA-like symptoms, PND, orthopnea, bleeding, claudication, DVT or PE-like symptoms, or lower extremity edema.   Past Medical History:  Diagnosis Date  . Arthritis   . Atrial fibrillation (HCC) 2006   a. in 2006; not on anticoagulation, no recent atrial fib seen as of 2018.  . Borderline hypertension   . CAD (coronary artery disease)    a. NSTEMI 12/2016: cath showing 95% mid-LAD stenosis, 75% prox-LAD stenosis and 75% 1st Diag stenosis. DES to  the proximal and mid-LAD along with the 1st Diagonal..  . Chronic back pain    Prior trauma with vertebral fracture  . Hyperlipidemia   . Hypertension   . Osteopenia   . Sinus bradycardia   . Type 2 diabetes mellitus (HCC)     Past Surgical History:  Procedure Laterality Date  . APPENDECTOMY  1960  . CATARACT EXTRACTION  2012   right eye,lens implantation  . CHOLECYSTECTOMY    . CIRCUMCISION  2010   meatotomy and dilatation  . COLONOSCOPY N/A 01/14/2013   Procedure: COLONOSCOPY;   Surgeon: Dalia Heading, MD;  Location: [facility] ENDO SUITE;  Service: Gastroenterology;  Laterality: N/A;  . CORONARY ANGIOGRAPHY N/A 01/15/2017   Procedure: Coronary Angiography;  Surgeon: Runell Gess, MD;  Location: [facility] INVASIVE CV LAB;  Service: Cardiovascular;  Laterality: N/A;  . CORONARY STENT INTERVENTION N/A 01/15/2017   Procedure: Coronary Stent Intervention;  Surgeon: Runell Gess, MD;  Location: [facility] INVASIVE CV LAB;  Service: Cardiovascular;  Laterality: N/A;  . INTRAVASCULAR PRESSURE WIRE/FFR STUDY N/A 04/04/2019   Procedure: INTRAVASCULAR PRESSURE WIRE/FFR STUDY;  Surgeon: Yvonne Kendall, MD;  Location: [facility] INVASIVE CV LAB;  Service: Cardiovascular;  Laterality: N/A;  . LEFT HEART CATH AND CORONARY ANGIOGRAPHY N/A 04/04/2019   Procedure: LEFT HEART CATH AND CORONARY ANGIOGRAPHY;  Surgeon: Yvonne Kendall, MD;  Location: [facility] INVASIVE CV LAB;  Service: Cardiovascular;  Laterality: N/A;    Current Outpatient Medications  Medication Sig Dispense Refill  . aspirin EC 81 MG EC tablet Take 1 tablet (81 mg total) by mouth daily. 30 tablet 0  . atorvastatin (LIPITOR) 80 MG tablet Take 1 tablet (80 mg total) by mouth daily at 6 PM. 90 tablet 3  . clopidogrel (PLAVIX) 75 MG tablet Take 1 tablet (75 mg total) by mouth daily. 90 tablet 3  . glimepiride (AMARYL) 1 MG tablet Take 1 mg by mouth daily with breakfast.     . HYDROcodone-acetaminophen (NORCO) 10-325 MG tablet Take 1 tablet by mouth every 6 (six) hours as needed for moderate pain.   0  . lisinopril (ZESTRIL) 10 MG tablet Take 1 tablet (10 mg total) by mouth daily. 30 tablet 6  . meclizine (ANTIVERT) 12.5 MG tablet Take 1 tablet (12.5 mg total) by mouth 3 (three) times daily as needed for dizziness. 30 tablet 0  . nitroGLYCERIN (NITROSTAT) 0.4 MG SL tablet Place 1 tablet (0.4 mg total) under the tongue every 5 (five) minutes as needed for chest pain. For 3 doses only 30 tablet 0   No current facility-administered medications for this  visit.   Allergies:  Brilinta [ticagrelor]   Social History: The patient  reports that he quit smoking about 23 years ago. His smoking use included cigarettes. He has a 22.50 pack-year smoking history. He has never used smokeless tobacco. He reports that he does not drink alcohol and does not use drugs.   Family History: The patient's family history includes CAD in his father; Heart attack in his brother.   ROS:  Please see the history of present illness. Otherwise, complete review of systems is positive for none.  All other systems are reviewed and negative.   Physical Exam: VS:  BP 138/68   Pulse (!) 52   Ht 5' 10.5" (1.791 m)   Wt 198 lb 6.4 oz (90 kg)   SpO2 95%   BMI 28.07 kg/m , BMI Body mass index is 28.07 kg/m.  Wt Readings from Last 3 Encounters:  05/14/20 198  lb 6.4 oz (90 kg)  05/03/20 195 lb (88.5 kg)  04/16/20 195 lb 12.8 oz (88.8 kg)    General: Patient appears comfortable at rest. Neck: Supple, no elevated JVP or carotid bruits, no thyromegaly. Lungs: Clear to auscultation, nonlabored breathing at rest. Cardiac: Regular rate and rhythm, no S3 or significant systolic murmur, no pericardial rub. Extremities: No pitting edema, distal pulses 2+. Skin: Warm and dry. Musculoskeletal: No kyphosis. Neuropsychiatric: Alert and oriented x3, affect grossly appropriate.  ECG:  EKG April 09, 2020 showed sinus bradycardia with a rate of 59, possible inferior infarct, age undetermined.   Recent Labwork: 06/11/2019: ALT 92; AST 97; TSH 1.724 05/03/2020: BUN 17; Creatinine, Ser 1.01; Hemoglobin 15.7; Platelets 186; Potassium 3.9; Sodium 134     Component Value Date/Time   CHOL 123 01/15/2017 0702   TRIG 158 (H) 01/15/2017 0702   HDL 36 (L) 01/15/2017 0702   CHOLHDL 3.4 01/15/2017 0702   VLDL 32 01/15/2017 0702   LDLCALC 55 01/15/2017 0702    Other Studies Reviewed Today:    Cardiac event monitor 12/18/2019 Study Highlights  ZIO XT reviewed.  13 days 22 hours analyzed.   Predominant rhythm is sinus with heart rate ranging from 42 bpm up to 106 bpm and average heart rate 54 bpm.  Brief runs of SVT were noted, the longest of which was only 10 beats.  There were rare PACs and PVCs representing less than 1% of total beats, also some ventricular trigeminy.  Patient triggered events did not correlate well with any specific arrhythmia/ectopy burden.  There were no pauses.    Cardiac catheterization 04/04/2019: Conclusions: 1. Mild to moderate, non-obstructive coronary artery disease, as detailed below. Long segment of three overlapping stents in the proximal through distal LAD is patent with up to 50% in-stent restenosis in the mid section that is not hemodynamically significant at this time with a DFR of 0.90-0.91 (cut point for significance is 0.89). 2. Widely patent stent in D1 with 50% focal disease proximal to the stent, which is not hemodynamically significant (DFR 0.95). 3. Normal left ventricular systolic function with mildly elevated filling pressure suggestive of diastolic dysfunction. 4. Sinus bradycardia with heart rates of 40-50 bpm noted throughout most of the case.  Recommendations: 1. Continue aggressive medical therapy, including indefinite dual antiplatelet therapy with aspirin and clopidogrel given long stented segment of the LAD. 2. Question if chronotropic incompetence is contributing to symptoms; I will decrease metoprolol tartrate to 12.5 twice daily. Add amlodipine 2.5 mg daily for improved blood pressure control and antianginal therapy, as [name] has been intolerant of isosorbide mononitrate.  Echocardiogram 01/14/2017 Study Conclusions   - Left ventricle: The cavity size was normal. There was mild focal  basal hypertrophy of the septum. Systolic function was normal.  The estimated ejection fraction was in the range of 55% to 60%.  Wall motion was normal; there were no regional wall motion  abnormalities. Doppler parameters  are consistent with abnormal  left ventricular relaxation (grade 1 diastolic dysfunction).  Doppler parameters are consistent with high ventricular filling  pressure.  - Aortic valve: There was trivial regurgitation.  - Mitral valve: Calcified annulus. Mildly thickened leaflets .  - Left atrium: The atrium was moderately dilated.  - Atrial septum: There was increased thickness of the septum,  consistent with lipomatous hypertrophy.  - Pulmonary arteries: Systolic pressure was mildly increased. PA  peak pressure: 32 mm Hg (S).   Impressions:   - Normal LV systolic function; grade 1  diastolic dysfunction with  elevated LV filling pressure; trace AI; moderate LAE; mild TR  with mildly elevated pulmonary pressure.   Assessment and Plan:  1. Syncope Recent near syncopal episode with associated palpitations [facility] ED.  Patient states he has had no subsequent lightheadedness, dizziness, presyncopal or syncopal episodes since the recent event.  States he had a previous monitor earlier in the year which did not show any significant arrhythmias.  He states he has an inherently slow heart rate.  Today's heart rate is 52.  He states his heart rate has been as low as the mid 40s in the past.  He states he was previously on metoprolol but it was discontinued.  2. Dyspnea on exertion Currently denies any issues with dyspnea on exertion.  States he works a full-time job currently without any symptoms.   2. CAD in native artery  CAD (status post DES to the proximal mid LAD, diagonal April 2018) patent by cardiac cath 2020.  Denies any anginal or exertional symptoms.  Continue aspirin 81 mg, nitroglycerin sublingual as needed for chest pain, Plavix 75 mg.    3. Hyperlipidemia LDL goal <70 Continue atorvastatin 80 mg daily.  Last lipid panel  11/30/2016 total cholesterol 123, triglycerides 158, HDL 36, LDL 55.  Patient had an active order for lipid profile entered on 01/26/2020  4. History of  palpitations Recent near syncopal episode with associated palpitations [facility] ED.  Patient states he has had palpitations in the past with the ZIO monitor in April.  He had some brief runs of SVT the longest of which was only 10 beats.  He had some rare PACs and PVCs.  Patient states he did notice some palpitations during the episode at work when he had a near syncopal episode.  Please get a 30-day [employer]  monitor to check for arrhythmias.  5.  Essential hypertension. At last visit he statedhis systolic blood pressures had been running anywhere from the 140s to 170s.  His lisinopril was increased to 10 mg daily.  Blood pressure today is 138/68.  Continue lisinopril 10 mg daily.  Medication Adjustments/Labs and Tests Ordered: Current medicines are reviewed at length with the patient today.  Concerns regarding medicines are outlined above.   Disposition: Follow-up with Dr. Diona Lopez or APP 6 to 8 weeks  Signed, Rennis Harding, NP 05/14/2020 11:49 AM    [facility] at [facility] [address] [place], [place], [place] [address] Phone: [phone]; Fax: [phone]

## 2020-05-14 ENCOUNTER — Other Ambulatory Visit: Payer: Self-pay

## 2020-05-14 ENCOUNTER — Ambulatory Visit: Payer: Medicare HMO | Admitting: Family Medicine

## 2020-05-14 ENCOUNTER — Encounter: Payer: Self-pay | Admitting: Family Medicine

## 2020-05-14 VITALS — BP 138/68 | HR 52 | Ht 70.5 in | Wt 198.4 lb

## 2020-05-14 DIAGNOSIS — R0609 Other forms of dyspnea: Secondary | ICD-10-CM

## 2020-05-14 DIAGNOSIS — R55 Syncope and collapse: Secondary | ICD-10-CM | POA: Diagnosis not present

## 2020-05-14 DIAGNOSIS — I2510 Atherosclerotic heart disease of native coronary artery without angina pectoris: Secondary | ICD-10-CM

## 2020-05-14 DIAGNOSIS — I10 Essential (primary) hypertension: Secondary | ICD-10-CM | POA: Diagnosis not present

## 2020-05-14 DIAGNOSIS — R0600 Dyspnea, unspecified: Secondary | ICD-10-CM

## 2020-05-14 DIAGNOSIS — E785 Hyperlipidemia, unspecified: Secondary | ICD-10-CM

## 2020-05-14 NOTE — Patient Instructions (Addendum)
Medication Instructions:   Your physician recommends that you continue on your current medications as directed. Please refer to the Current Medication list given to you today.  Labwork:  NONE  Testing/Procedures: Your physician has recommended that you wear an event monitor for 30 days. Event monitors are medical devices that record the heart's electrical activity. Doctors most often [place] these monitors to diagnose arrhythmias. Arrhythmias are problems with the speed or rhythm of the heartbeat. The monitor is a small, portable device. You can wear one while you do your normal daily activities. This is usually used to diagnose what is causing palpitations/syncope (passing out). [employer] will contact you about this monitor.  Follow-Up:  Your physician recommends that you schedule a follow-up appointment in: 6-8 weeks.  Any Other Special Instructions Will Be Listed Below (If Applicable).  If you need a refill on your cardiac medications before your next appointment, please call your pharmacy.

## 2020-05-15 ENCOUNTER — Ambulatory Visit
Admission: RE | Admit: 2020-05-15 | Discharge: 2020-05-15 | Disposition: A | Discharge status: Home / Self Care | Payer: Medicare HMO | Source: Ambulatory Visit | Attending: Family Medicine | Admitting: Family Medicine

## 2020-05-15 DIAGNOSIS — M25511 Pain in right shoulder: Secondary | ICD-10-CM

## 2020-05-26 ENCOUNTER — Telehealth: Payer: Self-pay | Admitting: *Deleted

## 2020-05-26 NOTE — Telephone Encounter (Signed)
Notification received from [employer] that they are unable to reach patient and monitor arrived at home but not activated yet. Contacted patient to check status, says he is not at home and will return home tomorrow.

## 2020-05-28 DIAGNOSIS — R55 Syncope and collapse: Secondary | ICD-10-CM | POA: Diagnosis not present

## 2020-06-01 ENCOUNTER — Ambulatory Visit (INDEPENDENT_AMBULATORY_CARE_PROVIDER_SITE_OTHER): Payer: Medicare HMO

## 2020-06-01 DIAGNOSIS — R55 Syncope and collapse: Secondary | ICD-10-CM

## 2020-06-07 ENCOUNTER — Ambulatory Visit: Payer: Medicare HMO | Admitting: Orthopedic Surgery

## 2020-06-16 ENCOUNTER — Ambulatory Visit: Payer: Medicare HMO | Admitting: Orthopedic Surgery

## 2020-06-16 ENCOUNTER — Encounter: Payer: Self-pay | Admitting: Orthopedic Surgery

## 2020-06-16 ENCOUNTER — Other Ambulatory Visit: Payer: Self-pay

## 2020-06-16 ENCOUNTER — Ambulatory Visit (INDEPENDENT_AMBULATORY_CARE_PROVIDER_SITE_OTHER): Payer: Medicare HMO | Admitting: Orthopedic Surgery

## 2020-06-16 VITALS — BP 152/69 | HR 51 | Ht 70.5 in | Wt 199.0 lb

## 2020-06-16 DIAGNOSIS — M75121 Complete rotator cuff tear or rupture of right shoulder, not specified as traumatic: Secondary | ICD-10-CM | POA: Diagnosis not present

## 2020-06-16 DIAGNOSIS — M12811 Other specific arthropathies, not elsewhere classified, right shoulder: Secondary | ICD-10-CM | POA: Diagnosis not present

## 2020-06-16 NOTE — Progress Notes (Signed)
New Patient Visit  Assessment: Shawn Lopez is a 79 y.o. RHD male with chronic right shoulder rotator cuff tear, with fatty infiltration  Plan: I had an extensive discussion with Shawn Lopez regarding his right shoulder.  I reviewed the radiographs as well as MRI which demonstrates a chronic appearing rotator cuff tear, with significant fatty infiltration.  As such, the rotator cuff cannot be repaired.  If he was interested in surgery, we discussed proceeding with a reverse shoulder arthroplasty.  He is not interested in surgery at this time.  At any time, if he is having significant pain in his shoulder, he can return to clinic for a steroid injection.  At this time, is not interested in any an injection and states he is doing fine.  He can continue to take medications as needed, with activities as tolerated.  No follow up needed at this time.   Follow-up: PRN  Subjective:  Chief Complaint  Patient presents with  . Shoulder Pain    right shoulder pain, had MRI, possible tear,     History of Present Illness: Shawn Lopez is a 79 y.o. RHD male who was referred to clinic today by Shawn Cloud, NP for evaluation of his right shoulder.  He states he been having pain, with difficulty with his range of motion for the last 18 months.  He states prior to that he had a few issues.  However, he has seen Dr. Romeo Lopez in the past for shoulder, most recently in 2015.  He continues to work on his farm, and despite his limited range of motion, he has adapted well.  He takes medications occasionally for his pain.  He has done therapy in the past but none recently.  He presents today for further discussion regarding his shoulder and the possibility of surgical consultation.  He does have difficulty getting his hand to his face, but once again he is adapted and is able to do everything related to work.  His pain is worse at night.  Of note, he enjoys being outdoors, including hunting and  fishing but he is able to participate in these activities.  Review of Systems: No fevers or chills No chest pain No shortness of breath   Medical History:  Past Medical History:  Diagnosis Date  . Arthritis   . Atrial fibrillation (HCC) 2006   a. in 2006; not on anticoagulation, no recent atrial fib seen as of 2018.  . Borderline hypertension   . CAD (coronary artery disease)    a. NSTEMI 12/2016: cath showing 95% mid-LAD stenosis, 75% prox-LAD stenosis and 75% 1st Diag stenosis. DES to the proximal and mid-LAD along with the 1st Diagonal..  . Chronic back pain    Prior trauma with vertebral fracture  . Hyperlipidemia   . Hypertension   . Osteopenia   . Sinus bradycardia   . Type 2 diabetes mellitus (HCC)     Past Surgical History:  Procedure Laterality Date  . APPENDECTOMY  1960  . CATARACT EXTRACTION  2012   right eye,lens implantation  . CHOLECYSTECTOMY    . CIRCUMCISION  2010   meatotomy and dilatation  . COLONOSCOPY N/A 01/14/2013   Procedure: COLONOSCOPY;  Surgeon: Dalia Heading, MD;  Location: [facility] ENDO SUITE;  Service: Gastroenterology;  Laterality: N/A;  . CORONARY ANGIOGRAPHY N/A 01/15/2017   Procedure: Coronary Angiography;  Surgeon: Runell Gess, MD;  Location: [facility] INVASIVE CV LAB;  Service: Cardiovascular;  Laterality: N/A;  . CORONARY STENT INTERVENTION N/A  01/15/2017   Procedure: Coronary Stent Intervention;  Surgeon: Runell Gess, MD;  Location: [facility] INVASIVE CV LAB;  Service: Cardiovascular;  Laterality: N/A;  . INTRAVASCULAR PRESSURE WIRE/FFR STUDY N/A 04/04/2019   Procedure: INTRAVASCULAR PRESSURE WIRE/FFR STUDY;  Surgeon: Yvonne Kendall, MD;  Location: [facility] INVASIVE CV LAB;  Service: Cardiovascular;  Laterality: N/A;  . LEFT HEART CATH AND CORONARY ANGIOGRAPHY N/A 04/04/2019   Procedure: LEFT HEART CATH AND CORONARY ANGIOGRAPHY;  Surgeon: Yvonne Kendall, MD;  Location: [facility] INVASIVE CV LAB;  Service: Cardiovascular;  Laterality: N/A;    Family History   Problem Relation Age of Onset  . CAD Father   . Heart attack Brother    Social History   Tobacco Use  . Smoking status: Former Smoker    Packs/day: 1.50    Years: 15.00    Pack years: 22.50    Types: Cigarettes    Quit date: 04/20/1997    Years since quitting: 23.1  . Smokeless tobacco: Never Used  Vaping Use  . Vaping Use: Never used  Substance Use Topics  . Alcohol use: No  . Drug use: No    Allergies  Allergen Reactions  . Brilinta [Ticagrelor]     Dyspnea     Current Meds  Medication Sig  . aspirin EC 81 MG EC tablet Take 1 tablet (81 mg total) by mouth daily.  Marland Kitchen atorvastatin (LIPITOR) 80 MG tablet Take 1 tablet (80 mg total) by mouth daily at 6 PM.  . clopidogrel (PLAVIX) 75 MG tablet Take 1 tablet (75 mg total) by mouth daily.  Marland Kitchen HYDROcodone-acetaminophen (NORCO) 10-325 MG tablet Take 1 tablet by mouth every 6 (six) hours as needed for moderate pain.   Marland Kitchen lisinopril (ZESTRIL) 10 MG tablet Take 1 tablet (10 mg total) by mouth daily.  . meclizine (ANTIVERT) 12.5 MG tablet Take 1 tablet (12.5 mg total) by mouth 3 (three) times daily as needed for dizziness.  . nitroGLYCERIN (NITROSTAT) 0.4 MG SL tablet Place 1 tablet (0.4 mg total) under the tongue every 5 (five) minutes as needed for chest pain. For 3 doses only  . [DISCONTINUED] glimepiride (AMARYL) 1 MG tablet Take 1 mg by mouth daily with breakfast.     Objective: BP (!) 152/69   Pulse (!) 51   Ht 5' 10.5" (1.791 m)   Wt 90.3 kg   BMI 28.15 kg/m   Physical Exam:  Alert and oriented no acute distress. No increased work of breathing on room air Walks with a nonantalgic gait  Evaluation of the right shoulder demonstrates no obvious deformity.  Some atrophy is noted, particularly the posterior aspect of the shoulder.  He does have a small swelling over the posterior lateral aspect of the shoulder, but this is nontender.  He has limited forward flexion to approximately 100 degrees.  He is unable to get to the  left side of his face.  Positive drop arm test.  Pain with empty can testing.  4/5 strength in the supraspinatus, as well as the infraspinatus.  Negative belly press.   IMAGING: I personally reviewed the following images:  X-ray of the right shoulder was obtained prior to his visit today, but these were reviewed in clinic and demonstrate maintenance of the glenohumeral joint space.  There is some proximal humeral migration.  MRI of the right shoulder was evaluated and is demonstrates complete full-thickness tear of the supraspinatus and infraspinatus with retraction to the level of the glenoid.  There is also significant fatty infiltration in  both supraspinatus as well as infraspinatus muscle belly.  No orders of the defined types were placed in this encounter.     Oliver Barre, MD  06/16/2020 10:45 AM

## 2020-06-29 ENCOUNTER — Ambulatory Visit: Payer: Medicare HMO | Admitting: Cardiology

## 2020-06-30 ENCOUNTER — Telehealth: Payer: Self-pay | Admitting: *Deleted

## 2020-06-30 NOTE — Telephone Encounter (Signed)
-----   Message from Netta Neat., NP sent at 06/28/2020  9:29 PM EDT ----- 30 day monitor showed some 1 brief episode of SVT and 2 brief episodes of NSVT but none the NSVT episodes caused any symptoms per Dr McDowell's interpretation.

## 2020-06-30 NOTE — Telephone Encounter (Signed)
Patient informed and verbalized understanding. Copy sent to PCP 

## 2020-07-08 NOTE — Progress Notes (Deleted)
Cardiology Office Note  Date: 07/08/2020   ID: Shawn, Lopez 25-Jun-1941, MRN [id number]  PCP:  [name], MD  Cardiologist:  [name], MD Electrophysiologist:  None   Chief Complaint: CAD  History of Present Illness: Shawn Lopez is a 79 y.o. male with a history of CAD (status post DES to the proximal mid LAD, diagonal April 2018) patent by cardiac cath 2020.  Last saw Dr. [name] 01/26/2020.  He studied felt the best he had in quite a while over the last 3 months.  He had stopped his Norvasc to the dizziness.  He did not report any palpitations or syncope, or anginal symptoms or nitroglycerin use.  Cardiac monitor showed rare PACs and PVCs, brief episodes of SVT, no sustained arrhythmias.  His beta-blocker had been stopped in the past due to symptomatic bradycardia.  He remains on Lipitor.  FLP's and LFTs were ordered for next visit.  Had no active palpitations at the time.  Would not start him on AV nodal blockers given history of bradycardia.  No sustained arrhythmias.  Recent visit to [facility] emergency department on 7/16/2021with complaints of an episode of palpitations that began around 5 PM that day.  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  Subsequently checked his blood pressure noted to be 172/115.  Upon arrival to the emergency room symptoms had resolved and he was asymptomatic.  Stated his symptoms lasted approximately an hour.  He denied any associated lightheadedness, dizziness, syncope, dyspnea, chest pain, nausea, vomiting, leg pain or swelling.  Lab work was essentially unremarkable he had to negative troponins.  Chest x-ray normal, CBC normal, sodium 131, glucose 142, calcium 8.4   Recent [facility] ED 05/03/2020 visit for near syncope while at work. Was squatting down and upon standing he felt very lightheaded with sensation of room spinning. This caused him to fall on his buttocks. He  did not fully lose consciousness. Had some chest pressure which resolved after 1-2 minutes. Reported some associated palpitations which resolved. Referred back to cardiology to evaluate for cardiogenic syncope.  Patient is here status post recent hospital visit for syncope while at work.  Patient states he was working around a [employer] and stood up and felt dizzy.  States he fell and became sweaty.  States he did feel some palpitations prior to the episode.  States he has had palpitations in the past and had a monitor which he he stated did not show any significant arrhythmias.  He states he had been on a beta-blocker metoprolol in the past and was taken off of it.  He states he is unsure as to why the metoprolol was stopped.  He states he inherently has a slow heart rate.  He states in the past it has been in the mid 40s and he tolerates the rate.  He denies any subsequent episodes since this recent occurrence at work.  He denies any orthostatic symptoms, CVA or TIA-like symptoms, PND, orthopnea, bleeding, claudication, DVT or PE-like symptoms, or lower extremity edema.   Past Medical History:  Diagnosis Date   Arthritis    Atrial fibrillation (HCC) 2006   a. in 2006; not on anticoagulation, no recent atrial fib seen as of 2018.   Borderline hypertension    CAD (coronary artery disease)    a. NSTEMI 12/2016: cath showing 95% mid-LAD stenosis, 75% prox-LAD stenosis and 75% 1st Diag stenosis. DES to  the proximal and mid-LAD along with the 1st Diagonal..   Chronic back pain    Prior trauma with vertebral fracture   Hyperlipidemia    Hypertension    Osteopenia    Sinus bradycardia    Type 2 diabetes mellitus (HCC)     Past Surgical History:  Procedure Laterality Date   APPENDECTOMY  1960   CATARACT EXTRACTION  2012   right eye,lens implantation   CHOLECYSTECTOMY     CIRCUMCISION  2010   meatotomy and dilatation   COLONOSCOPY N/A 01/14/2013   Procedure: COLONOSCOPY;   Surgeon: Dalia Heading, MD;  Location: [facility] ENDO SUITE;  Service: Gastroenterology;  Laterality: N/A;   CORONARY ANGIOGRAPHY N/A 01/15/2017   Procedure: Coronary Angiography;  Surgeon: Runell Gess, MD;  Location: [facility] INVASIVE CV LAB;  Service: Cardiovascular;  Laterality: N/A;   CORONARY STENT INTERVENTION N/A 01/15/2017   Procedure: Coronary Stent Intervention;  Surgeon: Runell Gess, MD;  Location: [facility] INVASIVE CV LAB;  Service: Cardiovascular;  Laterality: N/A;   INTRAVASCULAR PRESSURE WIRE/FFR STUDY N/A 04/04/2019   Procedure: INTRAVASCULAR PRESSURE WIRE/FFR STUDY;  Surgeon: Yvonne Kendall, MD;  Location: [facility] INVASIVE CV LAB;  Service: Cardiovascular;  Laterality: N/A;   LEFT HEART CATH AND CORONARY ANGIOGRAPHY N/A 04/04/2019   Procedure: LEFT HEART CATH AND CORONARY ANGIOGRAPHY;  Surgeon: Yvonne Kendall, MD;  Location: [facility] INVASIVE CV LAB;  Service: Cardiovascular;  Laterality: N/A;    Current Outpatient Medications  Medication Sig Dispense Refill   aspirin EC 81 MG EC tablet Take 1 tablet (81 mg total) by mouth daily. 30 tablet 0   atorvastatin (LIPITOR) 80 MG tablet Take 1 tablet (80 mg total) by mouth daily at 6 PM. 90 tablet 3   clopidogrel (PLAVIX) 75 MG tablet Take 1 tablet (75 mg total) by mouth daily. 90 tablet 3   glimepiride (AMARYL) 2 MG tablet Take 1 tablet by mouth daily.     HYDROcodone-acetaminophen (NORCO) 10-325 MG tablet Take 1 tablet by mouth every 6 (six) hours as needed for moderate pain.   0   lisinopril (ZESTRIL) 10 MG tablet Take 1 tablet (10 mg total) by mouth daily. 30 tablet 6   meclizine (ANTIVERT) 12.5 MG tablet Take 1 tablet (12.5 mg total) by mouth 3 (three) times daily as needed for dizziness. 30 tablet 0   nitroGLYCERIN (NITROSTAT) 0.4 MG SL tablet Place 1 tablet (0.4 mg total) under the tongue every 5 (five) minutes as needed for chest pain. For 3 doses only 30 tablet 0   No current facility-administered medications for this visit.    Allergies:  Brilinta [ticagrelor]   Social History: The patient  reports that he quit smoking about 23 years ago. His smoking use included cigarettes. He has a 22.50 pack-year smoking history. He has never used smokeless tobacco. He reports that he does not drink alcohol and does not use drugs.   Family History: The patient's family history includes CAD in his father; Heart attack in his brother.   ROS:  Please see the history of present illness. Otherwise, complete review of systems is positive for none.  All other systems are reviewed and negative.   Physical Exam: VS:  There were no vitals taken for this visit., BMI There is no height or weight on file to calculate BMI.  Wt Readings from Last 3 Encounters:  06/16/20 199 lb (90.3 kg)  05/14/20 198 lb 6.4 oz (90 kg)  05/03/20 195 lb (88.5 kg)    General: Patient appears  comfortable at rest. Neck: Supple, no elevated JVP or carotid bruits, no thyromegaly. Lungs: Clear to auscultation, nonlabored breathing at rest. Cardiac: Regular rate and rhythm, no S3 or significant systolic murmur, no pericardial rub. Extremities: No pitting edema, distal pulses 2+. Skin: Warm and dry. Musculoskeletal: No kyphosis. Neuropsychiatric: Alert and oriented x3, affect grossly appropriate.  ECG:  EKG April 09, 2020 showed sinus bradycardia with a rate of 59, possible inferior infarct, age undetermined.   Recent Labwork: 05/03/2020: BUN 17; Creatinine, Ser 1.01; Hemoglobin 15.7; Platelets 186; Potassium 3.9; Sodium 134     Component Value Date/Time   CHOL 123 01/15/2017 0702   TRIG 158 (H) 01/15/2017 0702   HDL 36 (L) 01/15/2017 0702   CHOLHDL 3.4 01/15/2017 0702   VLDL 32 01/15/2017 0702   LDLCALC 55 01/15/2017 0702    Other Studies Reviewed Today:  Cardiac monitor 06/01/2020 Study Highlights  [employer] monitor reviewed.  30 days analyzed.  Predominant rhythm is sinus with heart rate ranging from 42 bpm up to 110 bpm and average heart rate 52  bpm.  10 beat run of SVT noted on September 15, 7 beat run of NSVT noted on September 18, and 4 beat run of NSVT noted on September 29. There were no sustained arrhythmias or pauses.  Episodes of NSVT were not clearly symptom provoking       Cardiac event monitor 12/18/2019 Study Highlights  ZIO XT reviewed.  13 days 22 hours analyzed.  Predominant rhythm is sinus with heart rate ranging from 42 bpm up to 106 bpm and average heart rate 54 bpm.  Brief runs of SVT were noted, the longest of which was only 10 beats.  There were rare PACs and PVCs representing less than 1% of total beats, also some ventricular trigeminy.  Patient triggered events did not correlate well with any specific arrhythmia/ectopy burden.  There were no pauses.    Cardiac catheterization 04/04/2019: Conclusions: 1. Mild to moderate, non-obstructive coronary artery disease, as detailed below. Long segment of three overlapping stents in the proximal through distal LAD is patent with up to 50% in-stent restenosis in the mid section that is not hemodynamically significant at this time with a DFR of 0.90-0.91 (cut point for significance is 0.89). 2. Widely patent stent in D1 with 50% focal disease proximal to the stent, which is not hemodynamically significant (DFR 0.95). 3. Normal left ventricular systolic function with mildly elevated filling pressure suggestive of diastolic dysfunction. 4. Sinus bradycardia with heart rates of 40-50 bpm noted throughout most of the case.  Recommendations: 1. Continue aggressive medical therapy, including indefinite dual antiplatelet therapy with aspirin and clopidogrel given long stented segment of the LAD. 2. Question if chronotropic incompetence is contributing to symptoms; I will decrease metoprolol tartrate to 12.5 twice daily. Add amlodipine 2.5 mg daily for improved blood pressure control and antianginal therapy, as Mr. Snelgrove has been intolerant of isosorbide  mononitrate.  Echocardiogram 01/14/2017 Study Conclusions   - Left ventricle: The cavity size was normal. There was mild focal  basal hypertrophy of the septum. Systolic function was normal.  The estimated ejection fraction was in the range of 55% to 60%.  Wall motion was normal; there were no regional wall motion  abnormalities. Doppler parameters are consistent with abnormal  left ventricular relaxation (grade 1 diastolic dysfunction).  Doppler parameters are consistent with high ventricular filling  pressure.  - Aortic valve: There was trivial regurgitation.  - Mitral valve: Calcified annulus. Mildly thickened leaflets .  -  Left atrium: The atrium was moderately dilated.  - Atrial septum: There was increased thickness of the septum,  consistent with lipomatous hypertrophy.  - Pulmonary arteries: Systolic pressure was mildly increased. PA  peak pressure: 32 mm Hg (S).   Impressions:   - Normal LV systolic function; grade 1 diastolic dysfunction with  elevated LV filling pressure; trace AI; moderate LAE; mild TR  with mildly elevated pulmonary pressure.   Assessment and Plan:  1. Syncope Recent near syncopal episode with associated palpitations [facility] ED.  Patient states he has had no subsequent lightheadedness, dizziness, presyncopal or syncopal episodes since the recent event.  States he had a previous monitor earlier in the year which did not show any significant arrhythmias.  He states he has an inherently slow heart rate.  Today's heart rate is 52.  He states his heart rate has been as low as the mid 40s in the past.  He states he was previously on metoprolol but it was discontinued.  2. Dyspnea on exertion Currently denies any issues with dyspnea on exertion.  States he works a full-time job currently without any symptoms.   2. CAD in native artery  CAD (status post DES to the proximal mid LAD, diagonal April 2018) patent by cardiac cath 2020.  Denies any  anginal or exertional symptoms.  Continue aspirin 81 mg, nitroglycerin sublingual as needed for chest pain, Plavix 75 mg.    3. Hyperlipidemia LDL goal <70 Continue atorvastatin 80 mg daily.  Last lipid panel  11/30/2016 total cholesterol 123, triglycerides 158, HDL 36, LDL 55.  Patient had an active order for lipid profile entered on 01/26/2020  4. History of palpitations Recent near syncopal episode with associated palpitations [facility] ED.  Patient states he has had palpitations in the past with the ZIO monitor in April.  He had some brief runs of SVT the longest of which was only 10 beats.  He had some rare PACs and PVCs.  Patient states he did notice some palpitations during the episode at work when he had a near syncopal episode.  Please get a 30-day [employer]  monitor to check for arrhythmias.  5.  Essential hypertension. At last visit he statedhis systolic blood pressures had been running anywhere from the 140s to 170s.  His lisinopril was increased to 10 mg daily.  Blood pressure today is 138/68.  Continue lisinopril 10 mg daily.  Medication Adjustments/Labs and Tests Ordered: Current medicines are reviewed at length with the patient today.  Concerns regarding medicines are outlined above.   Disposition: Follow-up with Dr. Diona BrownerMcDowell or [facility]   Signed, Rennis HardingAndrew Quinn, NP 07/08/2020 5:04 PM    [facility] [address], [place], [place] [address] Phone: [phone]; Fax: [phone]

## 2020-07-09 ENCOUNTER — Ambulatory Visit: Payer: Medicare HMO | Admitting: Family Medicine

## 2020-07-12 NOTE — Progress Notes (Signed)
Cardiology Office Note  Date: 07/13/2020   ID: Shawn Lopez, Shawn Lopez Feb 06, 1941, MRN [id number]  PCP:  [name], MD  Cardiologist:  [name], MD Electrophysiologist:  None   Chief Complaint: CAD  History of Present Illness: Shawn Lopez is a 79 y.o. male with a history of CAD (status post DES to the proximal mid LAD, diagonal April 2018) patent by cardiac cath 2020.  Last saw Dr. [name] 01/26/2020.  He studied felt the best he had in quite a while over the last 3 months.  He had stopped his Norvasc to the dizziness.  He did not report any palpitations or syncope, or anginal symptoms or nitroglycerin use.  Cardiac monitor showed rare PACs and PVCs, brief episodes of SVT, no sustained arrhythmias.  His beta-blocker had been stopped in the past due to symptomatic bradycardia.  He remains on Lipitor.  FLP's and LFTs were ordered for next visit.  Had no active palpitations at the time.  Would not start him on AV nodal blockers given history of bradycardia.  No sustained arrhythmias.  Recent visit to [facility] emergency department on 7/16/2021with complaints of an episode of palpitations that began around 5 PM that day.  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  Subsequently checked his blood pressure noted to be 172/115.  Upon arrival to the emergency room symptoms had resolved and he was asymptomatic.  Stated his symptoms lasted approximately an hour.  He denied any associated lightheadedness, dizziness, syncope, dyspnea, chest pain, nausea, vomiting, leg pain or swelling.  Lab work was essentially unremarkable he had to negative troponins.  Chest x-ray normal, CBC normal, sodium 131, glucose 142, calcium 8.4   Recent [facility] ED 05/03/2020 visit for near syncope while at work. Was squatting down and upon standing he felt very lightheaded with sensation of room spinning. This caused him to fall on his buttocks. He  did not fully lose consciousness. Had some chest pressure which resolved after 1-2 minutes. Reported some associated palpitations which resolved. Referred back to cardiology to evaluate for cardiogenic syncope.  Was here last visit status post recent hospital visit for syncope while at work.  Stated he was working around a [employer] and stood up and felt dizzy.  Stated he fell and became sweaty.  Stated he did feel some palpitations prior to the episode.  Stated he has had palpitations in the past and had a monitor which he he stated did not show any significant arrhythmias.  He had been on a beta-blocker metoprolol in the past and was taken off of it.  He was unsure as to why the metoprolol was stopped.  He stated he inherently has a slow heart rate.  He stated in the past it has been in the mid 40s and he tolerates the rate.  He denied any subsequent episodes since this recent occurrence at work.  He denies any orthostatic symptoms, CVA or TIA-like symptoms, PND, orthopnea, bleeding, claudication, DVT or PE-like symptoms, or lower extremity edema.  He presents today with recent complaints of burning sensation in the middle of his chest.  He also describes it as tightness.  He states he could walk from here to his car and have chest tightness.  He states the burning can occur with and without exertion.  He denies any radiation.  Denies any associated nausea, vomiting or diaphoresis.  Past Medical History:  Diagnosis Date  .  Arthritis   . Atrial fibrillation (HCC) 2006   a. in 2006; not on anticoagulation, no recent atrial fib seen as of 2018.  . Borderline hypertension   . CAD (coronary artery disease)    a. NSTEMI 12/2016: cath showing 95% mid-LAD stenosis, 75% prox-LAD stenosis and 75% 1st Diag stenosis. DES to the proximal and mid-LAD along with the 1st Diagonal..  . Chronic back pain    Prior trauma with vertebral fracture  . Hyperlipidemia   . Hypertension   . Osteopenia   . Sinus  bradycardia   . Type 2 diabetes mellitus (HCC)     Past Surgical History:  Procedure Laterality Date  . APPENDECTOMY  1960  . CATARACT EXTRACTION  2012   right eye,lens implantation  . CHOLECYSTECTOMY    . CIRCUMCISION  2010   meatotomy and dilatation  . COLONOSCOPY N/A 01/14/2013   Procedure: COLONOSCOPY;  Surgeon: Dalia Heading, MD;  Location: [facility] ENDO SUITE;  Service: Gastroenterology;  Laterality: N/A;  . CORONARY ANGIOGRAPHY N/A 01/15/2017   Procedure: Coronary Angiography;  Surgeon: Runell Gess, MD;  Location: [facility] INVASIVE CV LAB;  Service: Cardiovascular;  Laterality: N/A;  . CORONARY STENT INTERVENTION N/A 01/15/2017   Procedure: Coronary Stent Intervention;  Surgeon: Runell Gess, MD;  Location: [facility] INVASIVE CV LAB;  Service: Cardiovascular;  Laterality: N/A;  . INTRAVASCULAR PRESSURE WIRE/FFR STUDY N/A 04/04/2019   Procedure: INTRAVASCULAR PRESSURE WIRE/FFR STUDY;  Surgeon: Yvonne Kendall, MD;  Location: [facility] INVASIVE CV LAB;  Service: Cardiovascular;  Laterality: N/A;  . LEFT HEART CATH AND CORONARY ANGIOGRAPHY N/A 04/04/2019   Procedure: LEFT HEART CATH AND CORONARY ANGIOGRAPHY;  Surgeon: Yvonne Kendall, MD;  Location: [facility] INVASIVE CV LAB;  Service: Cardiovascular;  Laterality: N/A;    Current Outpatient Medications  Medication Sig Dispense Refill  . aspirin EC 81 MG EC tablet Take 1 tablet (81 mg total) by mouth daily. 30 tablet 0  . atorvastatin (LIPITOR) 80 MG tablet Take 1 tablet (80 mg total) by mouth daily at 6 PM. 90 tablet 3  . clopidogrel (PLAVIX) 75 MG tablet Take 1 tablet (75 mg total) by mouth daily. 90 tablet 3  . glimepiride (AMARYL) 2 MG tablet Take 1 tablet by mouth daily.    Marland Kitchen HYDROcodone-acetaminophen (NORCO) 10-325 MG tablet Take 1 tablet by mouth every 6 (six) hours as needed for moderate pain.   0  . lisinopril (ZESTRIL) 10 MG tablet Take 1 tablet (10 mg total) by mouth daily. 30 tablet 6  . meclizine (ANTIVERT) 12.5 MG tablet Take 1 tablet (12.5 mg  total) by mouth 3 (three) times daily as needed for dizziness. 30 tablet 0  . nitroGLYCERIN (NITROSTAT) 0.4 MG SL tablet Place 1 tablet (0.4 mg total) under the tongue every 5 (five) minutes as needed for chest pain. For 3 doses only 30 tablet 0   No current facility-administered medications for this visit.   Allergies:  Brilinta [ticagrelor]   Social History: The patient  reports that he quit smoking about 23 years ago. His smoking use included cigarettes. He has a 22.50 pack-year smoking history. He has never used smokeless tobacco. He reports that he does not drink alcohol and does not use drugs.   Family History: The patient's family history includes CAD in his father; Heart attack in his brother.   ROS:  Please see the history of present illness. Otherwise, complete review of systems is positive for none.  All other systems are reviewed and negative.   Physical  Exam: VS:  BP (!) 148/70   Pulse (!) 54   Ht 5\' 11"  (1.803 m)   Wt 198 lb 3.2 oz (89.9 kg)   SpO2 94%   BMI 27.64 kg/m , BMI Body mass index is 27.64 kg/m.  Wt Readings from Last 3 Encounters:  07/13/20 198 lb 3.2 oz (89.9 kg)  06/16/20 199 lb (90.3 kg)  05/14/20 198 lb 6.4 oz (90 kg)    General: Patient appears comfortable at rest. Neck: Supple, no elevated JVP or carotid bruits, no thyromegaly. Lungs: Clear to auscultation, nonlabored breathing at rest. Cardiac: Regular rate and rhythm, no S3 or significant systolic murmur, no pericardial rub. Extremities: No pitting edema, distal pulses 2+. Skin: Warm and dry. Musculoskeletal: No kyphosis. Neuropsychiatric: Alert and oriented x3, affect grossly appropriate.  ECG:  EKG April 09, 2020 showed sinus bradycardia with a rate of 59, possible inferior infarct, age undetermined.   Recent Labwork: 05/03/2020: BUN 17; Creatinine, Ser 1.01; Hemoglobin 15.7; Platelets 186; Potassium 3.9; Sodium 134     Component Value Date/Time   CHOL 123 01/15/2017 0702   TRIG 158 (H)  01/15/2017 0702   HDL 36 (L) 01/15/2017 0702   CHOLHDL 3.4 01/15/2017 0702   VLDL 32 01/15/2017 0702   LDLCALC 55 01/15/2017 0702    Other Studies Reviewed Today:  Cardiac monitor 06/01/2020 Study Highlights  [employer] monitor reviewed.  30 days analyzed.  Predominant rhythm is sinus with heart rate ranging from 42 bpm up to 110 bpm and average heart rate 52 bpm.  10 beat run of SVT noted on September 15, 7 beat run of NSVT noted on September 18, and 4 beat run of NSVT noted on September 29. There were no sustained arrhythmias or pauses.  Episodes of NSVT were not clearly symptom provoking       Cardiac event monitor 12/18/2019 Study Highlights  ZIO XT reviewed.  13 days 22 hours analyzed.  Predominant rhythm is sinus with heart rate ranging from 42 bpm up to 106 bpm and average heart rate 54 bpm.  Brief runs of SVT were noted, the longest of which was only 10 beats.  There were rare PACs and PVCs representing less than 1% of total beats, also some ventricular trigeminy.  Patient triggered events did not correlate well with any specific arrhythmia/ectopy burden.  There were no pauses.    Cardiac catheterization 04/04/2019: Conclusions: 1. Mild to moderate, non-obstructive coronary artery disease, as detailed below. Long segment of three overlapping stents in the proximal through distal LAD is patent with up to 50% in-stent restenosis in the mid section that is not hemodynamically significant at this time with a DFR of 0.90-0.91 (cut point for significance is 0.89). 2. Widely patent stent in D1 with 50% focal disease proximal to the stent, which is not hemodynamically significant (DFR 0.95). 3. Normal left ventricular systolic function with mildly elevated filling pressure suggestive of diastolic dysfunction. 4. Sinus bradycardia with heart rates of 40-50 bpm noted throughout most of the case.  Recommendations: 1. Continue aggressive medical therapy, including indefinite dual  antiplatelet therapy with aspirin and clopidogrel given long stented segment of the LAD. 2. Question if chronotropic incompetence is contributing to symptoms; I will decrease metoprolol tartrate to 12.5 twice daily. Add amlodipine 2.5 mg daily for improved blood pressure control and antianginal therapy, as [name] has been intolerant of isosorbide mononitrate.  Echocardiogram 01/14/2017 Study Conclusions   - Left ventricle: The cavity size was normal. There was mild focal  basal  hypertrophy of the septum. Systolic function was normal.  The estimated ejection fraction was in the range of 55% to 60%.  Wall motion was normal; there were no regional wall motion  abnormalities. Doppler parameters are consistent with abnormal  left ventricular relaxation (grade 1 diastolic dysfunction).  Doppler parameters are consistent with high ventricular filling  pressure.  - Aortic valve: There was trivial regurgitation.  - Mitral valve: Calcified annulus. Mildly thickened leaflets .  - Left atrium: The atrium was moderately dilated.  - Atrial septum: There was increased thickness of the septum,  consistent with lipomatous hypertrophy.  - Pulmonary arteries: Systolic pressure was mildly increased. PA  peak pressure: 32 mm Hg (S).   Impressions:   - Normal LV systolic function; grade 1 diastolic dysfunction with  elevated LV filling pressure; trace AI; moderate LAE; mild TR  with mildly elevated pulmonary pressure.   Assessment and Plan:  1. Syncope Recent near syncopal episode with associated palpitations [facility] ED.  Patient stated he has had no subsequent lightheadedness, dizziness, presyncopal or syncopal episodes since the recent event.  Stated he had a previous monitor earlier in the year which did not show any significant arrhythmias.  He stated he has an inherently slow heart rate.  Today's heart rate is 54  He states his heart rate has been as low as the mid 40s in the  past.  He denies any episodes in the interim of syncope since last visit.  2. Dyspnea on exertion/chest tightness Recently describing burning in chest with chest tightness with exertion.  States the burning in the chest can occur with and without activity.  Denies any radiation.  Denies any associated nausea, vomiting, or diaphoresis.  States he can walk from here to his car and have chest tightness and some shortness of breath.  Given pre-existing heart disease and recent symptoms, please get an echocardiogram and Lexiscan stress test.  Please start Imdur 30 mg daily.  2. CAD in native artery  CAD (status post DES to the proximal mid LAD, diagonal April 2018) patent by cardiac cath 2020.  Denies any anginal or exertional symptoms.  Continue aspirin 81 mg, nitroglycerin sublingual as needed for chest pain, Plavix 75 mg.  We are adding Imdur 30 mg for recent chest burning complaints along with chest tightness on exertion.  In addition we are getting a Lexiscan stress test and an echocardiogram    3. Hyperlipidemia LDL goal <70 Continue atorvastatin 80 mg daily.  Last lipid panel  11/30/2016 total cholesterol 123, triglycerides 158, HDL 36, LDL 55.  Patient had an active order for lipid profile entered on 01/26/2020  4. History of palpitations Recent near syncopal episode with associated palpitations [facility] ED.  Patient states he has had palpitations in the past with the ZIO monitor in April.  He had some brief runs of SVT the longest of which was only 10 beats.  He had some rare PACs and PVCs.  Patient states he did notice some palpitations during the episode at work when he had a near syncopal episode.  Cardiac monitor 06/01/2020 predominant rhythm sinus with heart rate ranging from 42-110 with average of 52 bpm.  Had a 10 beat run of SVT on September 15, 7 beat run of NSVT on September 18, and 4 beat run of NSVT on September 29.  Episode of NSVT were not clearly symptom provoking.  He denies any current  palpitations or arrhythmias today  5.  Essential hypertension. At last visit he stated  his systolic blood pressures had been running anywhere from the 140s to 170s.  His lisinopril was increased to 10 mg daily.  Blood pressure today 148/70 continue lisinopril 10 mg daily.  Medication Adjustments/Labs and Tests Ordered: Current medicines are reviewed at length with the patient today.  Concerns regarding medicines are outlined above.   Disposition: Follow-up with Dr. [name] or [facility] 6 to 8 weeks  Signed, Rennis Harding, NP 07/13/2020 10:23 AM    [facility] at [facility] [address] [place], [place], [place] [address] Phone: [phone]; Fax: [phone]

## 2020-07-13 ENCOUNTER — Encounter: Payer: Self-pay | Admitting: *Deleted

## 2020-07-13 ENCOUNTER — Ambulatory Visit: Payer: Medicare HMO | Admitting: Family Medicine

## 2020-07-13 ENCOUNTER — Encounter: Payer: Self-pay | Admitting: Family Medicine

## 2020-07-13 VITALS — BP 148/70 | HR 54 | Ht 71.0 in | Wt 198.2 lb

## 2020-07-13 DIAGNOSIS — R0789 Other chest pain: Secondary | ICD-10-CM | POA: Diagnosis not present

## 2020-07-13 DIAGNOSIS — R079 Chest pain, unspecified: Secondary | ICD-10-CM | POA: Diagnosis not present

## 2020-07-13 DIAGNOSIS — R0602 Shortness of breath: Secondary | ICD-10-CM | POA: Diagnosis not present

## 2020-07-13 MED ORDER — ISOSORBIDE MONONITRATE ER 30 MG PO TB24: 30.0000 mg | ORAL_TABLET | Freq: Every day | ORAL | 6 refills | Status: DC

## 2020-07-13 NOTE — Patient Instructions (Signed)
Medication Instructions:   Begin Imdur 30mg  daily.  Continue all other medications.    Labwork: none  Testing/Procedures:  Your physician has requested that you have an echocardiogram. Echocardiography is a painless test that uses sound waves to create images of your heart. It provides your doctor with information about the size and shape of your heart and how well your heart's chambers and valves are working. This procedure takes approximately one hour. There are no restrictions for this procedure.  Your physician has requested that you have a lexiscan myoview. For further information please visit . Please follow instruction sheet, as given.  Office will contact with results via phone or letter.    Follow-Up: 6 weeks   Any Other Special Instructions Will Be Listed Below (If Applicable).  If you need a refill on your cardiac medications before your next appointment, please call your pharmacy.

## 2020-07-23 ENCOUNTER — Encounter (HOSPITAL_COMMUNITY): Payer: Self-pay

## 2020-07-23 ENCOUNTER — Ambulatory Visit (HOSPITAL_COMMUNITY)
Admission: RE | Admit: 2020-07-23 | Discharge: 2020-07-23 | Disposition: A | Discharge status: Home / Self Care | Payer: Medicare HMO | Source: Ambulatory Visit | Attending: Family Medicine | Admitting: Family Medicine

## 2020-07-23 ENCOUNTER — Encounter (HOSPITAL_BASED_OUTPATIENT_CLINIC_OR_DEPARTMENT_OTHER)
Admission: RE | Admit: 2020-07-23 | Discharge: 2020-07-23 | Disposition: A | Discharge status: Home / Self Care | Payer: Medicare HMO | Source: Ambulatory Visit | Attending: Family Medicine | Admitting: Family Medicine

## 2020-07-23 ENCOUNTER — Encounter (HOSPITAL_COMMUNITY)
Admission: RE | Admit: 2020-07-23 | Discharge: 2020-07-23 | Disposition: A | Discharge status: Home / Self Care | Payer: Medicare HMO | Source: Ambulatory Visit | Attending: Family Medicine | Admitting: Family Medicine

## 2020-07-23 ENCOUNTER — Other Ambulatory Visit: Payer: Self-pay

## 2020-07-23 DIAGNOSIS — R079 Chest pain, unspecified: Secondary | ICD-10-CM | POA: Insufficient documentation

## 2020-07-23 DIAGNOSIS — R0789 Other chest pain: Secondary | ICD-10-CM | POA: Insufficient documentation

## 2020-07-23 DIAGNOSIS — R0602 Shortness of breath: Secondary | ICD-10-CM | POA: Insufficient documentation

## 2020-07-23 LAB — NM MYOCAR MULTI W/SPECT W/WALL MOTION / EF
LV dias vol: 42 mL (ref 62–150)
LV sys vol: 35 mL
Peak HR: 81 {beats}/min
RATE: 0.4
Rest HR: 48 {beats}/min
SDS: 0
SRS: 2
SSS: 2
TID: 1.06

## 2020-07-23 LAB — ECHOCARDIOGRAM COMPLETE
AR max vel: 3.4 cm2
AV Area VTI: 2.73 cm2
AV Area mean vel: 2.75 cm2
AV Mean grad: 5.5 mmHg
AV Peak grad: 9.9 mmHg
Ao pk vel: 1.57 m/s
Area-P 1/2: 1.97 cm2
S' Lateral: 3.15 cm

## 2020-07-23 MED ORDER — REGADENOSON 0.4 MG/5ML IV SOLN
INTRAVENOUS | Status: AC
  Administered 2020-07-23: 0.4 mg via INTRAVENOUS
  Filled 2020-07-23: qty 5

## 2020-07-23 MED ORDER — TECHNETIUM TC 99M TETROFOSMIN IV KIT
10.0000 | PACK | Freq: Once | INTRAVENOUS | Status: AC | PRN
  Administered 2020-07-23: 11.2 via INTRAVENOUS

## 2020-07-23 MED ORDER — SODIUM CHLORIDE FLUSH 0.9 % IV SOLN
INTRAVENOUS | Status: AC
  Administered 2020-07-23: 10 mL via INTRAVENOUS
  Filled 2020-07-23: qty 10

## 2020-07-23 MED ORDER — TECHNETIUM TC 99M TETROFOSMIN IV KIT
30.0000 | PACK | Freq: Once | INTRAVENOUS | Status: AC | PRN
  Administered 2020-07-23: 32 via INTRAVENOUS

## 2020-07-23 NOTE — Progress Notes (Signed)
*  PRELIMINARY RESULTS* Echocardiogram 2D Echocardiogram has been performed.  Jeryl Columbia 07/23/2020, 1:12 PM

## 2020-07-27 ENCOUNTER — Telehealth: Payer: Self-pay | Admitting: *Deleted

## 2020-07-27 NOTE — Telephone Encounter (Signed)
Lesle Chris, LPN  06/27/1280 1:88 PM EDT     Notified, copy to pcp.

## 2020-07-27 NOTE — Telephone Encounter (Signed)
ECHO -  Netta Neat., NP  07/25/2020 8:16 PM EDT     Echocardiogram shows good pumping function of the heart. Mild muscular thickening of the left ventricle. Very mild leaking mitral valve. Nothing that would contribute to shortness of breath.    STRESS TEST -   Netta Neat., NP  07/25/2020 8:31 PM EDT     Stress test was abnormal due to decreased EF at 30 % (This is a calculated result done by the software on the [facility] during stress images and not always as accurate echo results). Echo done on same day EF was normal at 55-60%. (Echo EF results are more accurate because this is a more direct measurement in real time by the sonographer) .Evidence of prior heart attack which we are aware.

## 2020-07-28 ENCOUNTER — Ambulatory Visit: Payer: Medicare HMO | Admitting: Cardiology

## 2020-08-23 NOTE — Progress Notes (Signed)
Cardiology Office Note  Date: 08/24/2020   ID: Shawn, Lopez September 07, 1941, MRN [id number]  PCP:  [name], MD  Cardiologist:  [name], MD Electrophysiologist:  None   Chief Complaint: CAD  History of Present Illness: Shawn Lopez is a 79 y.o. male with a history of CAD (status post DES to the proximal mid LAD, diagonal April 2018) patent by cardiac cath 2020.  Last saw Dr. [name] 01/26/2020.  He studied felt the best he had in quite a while over the last 3 months.  He had stopped his Norvasc to the dizziness.  He did not report any palpitations or syncope, or anginal symptoms or nitroglycerin use.  Cardiac monitor showed rare PACs and PVCs, brief episodes of SVT, no sustained arrhythmias.  His beta-blocker had been stopped in the past due to symptomatic bradycardia.  He remains on Lipitor.  FLP's and LFTs were ordered for next visit.  Had no active palpitations at the time.  Would not start him on AV nodal blockers given history of bradycardia.  No sustained arrhythmias.  Recent visit to [facility] emergency department on 7/16/2021with complaints of an episode of palpitations that began around 5 PM that day.  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  Subsequently checked his blood pressure noted to be 172/115.  Upon arrival to the emergency room symptoms had resolved and he was asymptomatic.  Stated his symptoms lasted approximately an hour.  He denied any associated lightheadedness, dizziness, syncope, dyspnea, chest pain, nausea, vomiting, leg pain or swelling.  Lab work was essentially unremarkable he had to negative troponins.  Chest x-ray normal, CBC normal, sodium 131, glucose 142, calcium 8.4   Recent [facility] ED 05/03/2020 visit for near syncope while at work. Was squatting down and upon standing he felt very lightheaded with sensation of room spinning. This caused him to fall on his buttocks. He  did not fully lose consciousness. Had some chest pressure which resolved after 1-2 minutes. Reported some associated palpitations which resolved. Referred back to cardiology to evaluate for cardiogenic syncope.  Was here last visit status post recent hospital visit for syncope while at work.  Stated he was working around a [employer] and stood up and felt dizzy.  Stated he fell and became sweaty.  Stated he did feel some palpitations prior to the episode.  Stated he has had palpitations in the past and had a monitor which he he stated did not show any significant arrhythmias.  He had been on a beta-blocker metoprolol in the past and was taken off of it.  He was unsure as to why the metoprolol was stopped.  He stated he inherently has a slow heart rate.  He stated in the past it has been in the mid 40s and he tolerates the rate.  He denied any subsequent episodes since this recent occurrence at work.  He denies any orthostatic symptoms, CVA or TIA-like symptoms, PND, orthopnea, bleeding, claudication, DVT or PE-like symptoms, or lower extremity edema.  Presented last visit with complaints of burning sensation in the middle of his chest.  He also described it as tightness.  He states he could walk from here to his car and have chest tightness.  He stated the burning can occur with and without exertion.  He denied any radiation.  Denied any associated nausea, vomiting or diaphoresis.  He presents today status post recent stress test  and echocardiogram for follow-up and review of test results.  States he is feeling much better since he had all of his test.  He denies any anginal or exertional symptoms, palpitations or arrhythmias, orthostatic symptoms i.e. lightheadedness, dizziness, near syncope or syncopal episode except for one episode when his blood sugar was high.  He states that day his blood sugar was around 240.  Denies any CVA or TIA-like symptoms, PND orthopnea, claudication-like symptoms, DVT or  PE-like symptoms, lower extremity edema.  States his blood sugar seems to be fluctuating states sometimes its 200 or greater and sometimes lower than 100.  Past Medical History:  Diagnosis Date  . Arthritis   . Atrial fibrillation (HCC) 2006   a. in 2006; not on anticoagulation, no recent atrial fib seen as of 2018.  . Borderline hypertension   . CAD (coronary artery disease)    a. NSTEMI 12/2016: cath showing 95% mid-LAD stenosis, 75% prox-LAD stenosis and 75% 1st Diag stenosis. DES to the proximal and mid-LAD along with the 1st Diagonal..  . Chronic back pain    Prior trauma with vertebral fracture  . Hyperlipidemia   . Hypertension   . Osteopenia   . Sinus bradycardia   . Type 2 diabetes mellitus (HCC)     Past Surgical History:  Procedure Laterality Date  . APPENDECTOMY  1960  . CATARACT EXTRACTION  2012   right eye,lens implantation  . CHOLECYSTECTOMY    . CIRCUMCISION  2010   meatotomy and dilatation  . COLONOSCOPY N/A 01/14/2013   Procedure: COLONOSCOPY;  Surgeon: Dalia Heading, MD;  Location: [facility] ENDO SUITE;  Service: Gastroenterology;  Laterality: N/A;  . CORONARY ANGIOGRAPHY N/A 01/15/2017   Procedure: Coronary Angiography;  Surgeon: Runell Gess, MD;  Location: [facility] INVASIVE CV LAB;  Service: Cardiovascular;  Laterality: N/A;  . CORONARY STENT INTERVENTION N/A 01/15/2017   Procedure: Coronary Stent Intervention;  Surgeon: Runell Gess, MD;  Location: [facility] INVASIVE CV LAB;  Service: Cardiovascular;  Laterality: N/A;  . INTRAVASCULAR PRESSURE WIRE/FFR STUDY N/A 04/04/2019   Procedure: INTRAVASCULAR PRESSURE WIRE/FFR STUDY;  Surgeon: Yvonne Kendall, MD;  Location: [facility] INVASIVE CV LAB;  Service: Cardiovascular;  Laterality: N/A;  . LEFT HEART CATH AND CORONARY ANGIOGRAPHY N/A 04/04/2019   Procedure: LEFT HEART CATH AND CORONARY ANGIOGRAPHY;  Surgeon: Yvonne Kendall, MD;  Location: [facility] INVASIVE CV LAB;  Service: Cardiovascular;  Laterality: N/A;    Current Outpatient  Medications  Medication Sig Dispense Refill  . aspirin EC 81 MG EC tablet Take 1 tablet (81 mg total) by mouth daily. 30 tablet 0  . atorvastatin (LIPITOR) 80 MG tablet Take 1 tablet (80 mg total) by mouth daily at 6 PM. 90 tablet 3  . clopidogrel (PLAVIX) 75 MG tablet Take 1 tablet (75 mg total) by mouth daily. 90 tablet 3  . glimepiride (AMARYL) 2 MG tablet Take 1 tablet by mouth daily.    Marland Kitchen HYDROcodone-acetaminophen (NORCO) 10-325 MG tablet Take 1 tablet by mouth every 6 (six) hours as needed for moderate pain.   0  . lisinopril (ZESTRIL) 10 MG tablet Take 1 tablet (10 mg total) by mouth daily. 30 tablet 6  . meclizine (ANTIVERT) 12.5 MG tablet Take 1 tablet (12.5 mg total) by mouth 3 (three) times daily as needed for dizziness. 30 tablet 0  . nitroGLYCERIN (NITROSTAT) 0.4 MG SL tablet Place 1 tablet (0.4 mg total) under the tongue every 5 (five) minutes as needed for chest pain. For 3 doses only 30 tablet  0  . isosorbide mononitrate (IMDUR) 30 MG 24 hr tablet Take 1 tablet (30 mg total) by mouth daily. (Patient not taking: Reported on 08/24/2020) 30 tablet 6   No current facility-administered medications for this visit.   Allergies:  Brilinta [ticagrelor]   Social History: The patient  reports that he quit smoking about 23 years ago. His smoking use included cigarettes. He has a 22.50 pack-year smoking history. He has never used smokeless tobacco. He reports that he does not drink alcohol and does not use drugs.   Family History: The patient's family history includes CAD in his father; Heart attack in his brother.   ROS:  Please see the history of present illness. Otherwise, complete review of systems is positive for none.  All other systems are reviewed and negative.   Physical Exam: VS:  BP 132/70   Pulse 62   Ht  (1.803 m)   Wt 196 lb 3.2 oz (89 kg)   SpO2 98%   BMI 27.36 kg/m , BMI Body mass index is 27.36 kg/m.  Wt Readings from Last 3 Encounters:  08/24/20 196 lb 3.2  oz (89 kg)  07/13/20 198 lb 3.2 oz (89.9 kg)  06/16/20 199 lb (90.3 kg)    General: Patient appears comfortable at rest. Neck: Supple, no elevated JVP or carotid bruits, no thyromegaly. Lungs: Clear to auscultation, nonlabored breathing at rest. Cardiac: Regular rate and rhythm, no S3 or significant systolic murmur, no pericardial rub. Extremities: No pitting edema, distal pulses 2+. Skin: Warm and dry. Musculoskeletal: No kyphosis. Neuropsychiatric: Alert and oriented x3, affect grossly appropriate.  ECG:  EKG April 09, 2020 showed sinus bradycardia with a rate of 59, possible inferior infarct, age undetermined.   Recent Labwork: 05/03/2020: BUN 17; Creatinine, Ser 1.01; Hemoglobin 15.7; Platelets 186; Potassium 3.9; Sodium 134     Component Value Date/Time   CHOL 123 01/15/2017 0702   TRIG 158 (H) 01/15/2017 0702   HDL 36 (L) 01/15/2017 0702   CHOLHDL 3.4 01/15/2017 0702   VLDL 32 01/15/2017 0702   LDLCALC 55 01/15/2017 0702    Other Studies Reviewed Today:  NST 07/23/2020 Narrative & Impression   Horizontal ST segment depression ST segment depression of 0.5 mm was noted during stress in the V5 and V6 leads. 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  This is a high risk study. Risk based on decreased LVEF, fairly mild current ischemia that alone would be considered low risk. Recommend correlating LVEF with echocardiogram.  The left ventricular ejection fraction is severely decreased (<30%).      Echocardiogram 07/23/2020 1. Left ventricular ejection fraction, by estimation, is 55 to 60%. The left ventricle has normal function. The left ventricle has no regional wall motion abnormalities. There is mild left ventricular hypertrophy. Left ventricular diastolic parameters are indeterminate. Elevated left atrial pressure. 2. Right ventricular systolic function is normal. The right ventricular  size is normal. 3. Left atrial size was moderately dilated. 4. The mitral valve is normal in structure. Trivial mitral valve regurgitation. No evidence of mitral stenosis. 5. The aortic valve is tricuspid. There is mild calcification of the aortic valve. There is mild thickening of the aortic valve. Aortic valve regurgitation is not visualized. No aortic stenosis is present. 6. The inferior vena cava is normal in size with greater than 50% respiratory variability, suggesting right atrial pressure of 3 mmHg.    Cardiac monitor 06/01/2020 Study Highlights  [employer] monitor reviewed.  30 days analyzed.  Predominant rhythm is sinus with heart rate ranging from 42 bpm up to 110 bpm and average heart rate 52 bpm.  10 beat run of SVT noted on September 15, 7 beat run of NSVT noted on September 18, and 4 beat run of NSVT noted on September 29. There were no sustained arrhythmias or pauses.  Episodes of NSVT were not clearly symptom provoking       Cardiac event monitor 12/18/2019 Study Highlights  ZIO XT reviewed.  13 days 22 hours analyzed.  Predominant rhythm is sinus with heart rate ranging from 42 bpm up to 106 bpm and average heart rate 54 bpm.  Brief runs of SVT were noted, the longest of which was only 10 beats.  There were rare PACs and PVCs representing less than 1% of total beats, also some ventricular trigeminy.  Patient triggered events did not correlate well with any specific arrhythmia/ectopy burden.  There were no pauses.    Cardiac catheterization 04/04/2019: Conclusions: 1. Mild to moderate, non-obstructive coronary artery disease, as detailed below. Long segment of three overlapping stents in the proximal through distal LAD is patent with up to 50% in-stent restenosis in the mid section that is not hemodynamically significant at this time with a DFR of 0.90-0.91 (cut point for significance is 0.89). 2. Widely patent stent in D1 with 50% focal disease proximal to the stent,  which is not hemodynamically significant (DFR 0.95). 3. Normal left ventricular systolic function with mildly elevated filling pressure suggestive of diastolic dysfunction. 4. Sinus bradycardia with heart rates of 40-50 bpm noted throughout most of the case.  Recommendations: 1. Continue aggressive medical therapy, including indefinite dual antiplatelet therapy with aspirin and clopidogrel given long stented segment of the LAD. 2. Question if chronotropic incompetence is contributing to symptoms; I will decrease metoprolol tartrate to 12.5 twice daily. Add amlodipine 2.5 mg daily for improved blood pressure control and antianginal therapy, as [name] has been intolerant of isosorbide mononitrate.  Echocardiogram 01/14/2017 Study Conclusions   - Left ventricle: The cavity size was normal. There was mild focal  basal hypertrophy of the septum. Systolic function was normal.  The estimated ejection fraction was in the range of 55% to 60%.  Wall motion was normal; there were no regional wall motion  abnormalities. Doppler parameters are consistent with abnormal  left ventricular relaxation (grade 1 diastolic dysfunction).  Doppler parameters are consistent with high ventricular filling  pressure.  - Aortic valve: There was trivial regurgitation.  - Mitral valve: Calcified annulus. Mildly thickened leaflets .  - Left atrium: The atrium was moderately dilated.  - Atrial septum: There was increased thickness of the septum,  consistent with lipomatous hypertrophy.  - Pulmonary arteries: Systolic pressure was mildly increased. PA  peak pressure: 32 mm Hg (S).   Impressions:   - Normal LV systolic function; grade 1 diastolic dysfunction with  elevated LV filling pressure; trace AI; moderate LAE; mild TR  with mildly elevated pulmonary pressure.   Assessment and Plan:  1. Syncope No recent presyncopal or syncopal episodes.  Heart rate today is 62.  He is not on  any AV nodal blockers.  2. Dyspnea on exertion/chest tightness Denies any recent dyspnea on exertion or chest tightness.  We had started Imdur but patient states he cannot tolerate the Imdur it made him feel like he was "floating".  States he feels better not taking it.  2. CAD in native artery  CAD (status post DES to the  proximal mid LAD, diagonal April 2018) patent by cardiac cath 2020.  Denies any anginal or exertional symptoms.  Continue aspirin 81 mg, nitroglycerin sublingual as needed for chest pain, Plavix 75 mg.  Recent stress test 07/23/2020 was considered abnormal due to low EF however echocardiogram performed on the same day showed normal EF of 55 to 60%.  Otherwise the stress test was normal except for calculated low EF.  Continue aspirin 81 mg daily, clopidogrel 75 mg p.o. daily.  Sublingual nitroglycerin as needed.  Patient stopped isosorbide due to side effects.   3. Hyperlipidemia LDL goal <70 Continue atorvastatin 80 mg daily.  Last lipid panel  11/30/2016 total cholesterol 123, triglycerides 158, HDL 36, LDL 55.   4. History of palpitations Denies any recent palpitations or arrhythmias.  Denies any recent near syncopal or syncopal episode.  5.  Essential hypertension. Blood pressure reasonably well controlled today at 132/70.  Continue lisinopril 10 mg daily.  Medication Adjustments/Labs and Tests Ordered: Current medicines are reviewed at length with the patient today.  Concerns regarding medicines are outlined above.   Disposition: Follow-up with Dr. [name] or APP 6 months Signed, Rennis Harding, NP 08/24/2020 9:41 AM    [facility] [address] [place], [place], [place] [address] Phone: [phone]; Fax: [phone]

## 2020-08-24 ENCOUNTER — Encounter: Payer: Self-pay | Admitting: Family Medicine

## 2020-08-24 ENCOUNTER — Ambulatory Visit (INDEPENDENT_AMBULATORY_CARE_PROVIDER_SITE_OTHER): Payer: Medicare HMO | Admitting: Family Medicine

## 2020-08-24 VITALS — BP 132/70 | HR 62 | Ht 71.0 in | Wt 196.2 lb

## 2020-08-24 DIAGNOSIS — I2510 Atherosclerotic heart disease of native coronary artery without angina pectoris: Secondary | ICD-10-CM

## 2020-08-24 DIAGNOSIS — Z87898 Personal history of other specified conditions: Secondary | ICD-10-CM

## 2020-08-24 DIAGNOSIS — R0600 Dyspnea, unspecified: Secondary | ICD-10-CM | POA: Diagnosis not present

## 2020-08-24 DIAGNOSIS — I10 Essential (primary) hypertension: Secondary | ICD-10-CM

## 2020-08-24 DIAGNOSIS — R0609 Other forms of dyspnea: Secondary | ICD-10-CM

## 2020-08-24 DIAGNOSIS — E785 Hyperlipidemia, unspecified: Secondary | ICD-10-CM

## 2020-08-24 NOTE — Addendum Note (Signed)
Addended by: Lesle Chris on: 08/24/2020 09:59 AM   Modules accepted: Orders

## 2020-08-24 NOTE — Patient Instructions (Signed)
Medication Instructions:  Continue all current medications.   Labwork: none  Testing/Procedures: none  Follow-Up: 6 months   Any Other Special Instructions Will Be Listed Below (If Applicable).   If you need a refill on your cardiac medications before your next appointment, please call your pharmacy.  

## 2021-01-31 ENCOUNTER — Other Ambulatory Visit: Payer: Self-pay

## 2021-01-31 ENCOUNTER — Encounter (HOSPITAL_COMMUNITY): Payer: Self-pay | Admitting: Emergency Medicine

## 2021-01-31 ENCOUNTER — Emergency Department (HOSPITAL_COMMUNITY): Payer: Medicare HMO

## 2021-01-31 ENCOUNTER — Emergency Department (HOSPITAL_COMMUNITY)
Admission: EM | Admit: 2021-01-31 | Discharge: 2021-01-31 | Disposition: A | Discharge status: Home / Self Care | Payer: Medicare HMO | Attending: Emergency Medicine | Admitting: Emergency Medicine

## 2021-01-31 DIAGNOSIS — Z87891 Personal history of nicotine dependence: Secondary | ICD-10-CM | POA: Diagnosis not present

## 2021-01-31 DIAGNOSIS — R079 Chest pain, unspecified: Secondary | ICD-10-CM

## 2021-01-31 DIAGNOSIS — Z7902 Long term (current) use of antithrombotics/antiplatelets: Secondary | ICD-10-CM | POA: Insufficient documentation

## 2021-01-31 DIAGNOSIS — I2510 Atherosclerotic heart disease of native coronary artery without angina pectoris: Secondary | ICD-10-CM | POA: Diagnosis not present

## 2021-01-31 DIAGNOSIS — Z7984 Long term (current) use of oral hypoglycemic drugs: Secondary | ICD-10-CM | POA: Insufficient documentation

## 2021-01-31 DIAGNOSIS — E119 Type 2 diabetes mellitus without complications: Secondary | ICD-10-CM | POA: Insufficient documentation

## 2021-01-31 DIAGNOSIS — Z7982 Long term (current) use of aspirin: Secondary | ICD-10-CM | POA: Diagnosis not present

## 2021-01-31 DIAGNOSIS — R110 Nausea: Secondary | ICD-10-CM | POA: Insufficient documentation

## 2021-01-31 DIAGNOSIS — I10 Essential (primary) hypertension: Secondary | ICD-10-CM | POA: Diagnosis not present

## 2021-01-31 DIAGNOSIS — R011 Cardiac murmur, unspecified: Secondary | ICD-10-CM | POA: Diagnosis not present

## 2021-01-31 DIAGNOSIS — Z79899 Other long term (current) drug therapy: Secondary | ICD-10-CM | POA: Insufficient documentation

## 2021-01-31 LAB — BASIC METABOLIC PANEL
Anion gap: 7 (ref 5–15)
BUN: 17 mg/dL (ref 8–23)
CO2: 27 mmol/L (ref 22–32)
Calcium: 9 mg/dL (ref 8.9–10.3)
Chloride: 100 mmol/L (ref 98–111)
Creatinine, Ser: 0.96 mg/dL (ref 0.61–1.24)
GFR, Estimated: >60 mL/min (ref >60)
Glucose, Bld: 198 mg/dL — ABNORMAL HIGH (ref 70–99)
Potassium: 4.2 mmol/L (ref 3.5–5.1)
Sodium: 134 mmol/L — ABNORMAL LOW (ref 135–145)

## 2021-01-31 LAB — TROPONIN I (HIGH SENSITIVITY)
Troponin I (High Sensitivity): 5 ng/L (ref <18)
Troponin I (High Sensitivity): 6 ng/L (ref <18)

## 2021-01-31 LAB — CBC
HCT: 46.8 % (ref 39.0–52.0)
Hemoglobin: 15 g/dL (ref 13.0–17.0)
MCH: 30.1 pg (ref 26.0–34.0)
MCHC: 32.1 g/dL (ref 30.0–36.0)
MCV: 94 fL (ref 80.0–100.0)
Platelets: 218 10*3/uL (ref 150–400)
RBC: 4.98 MIL/uL (ref 4.22–5.81)
RDW: 13.5 % (ref 11.5–15.5)
WBC: 8.3 10*3/uL (ref 4.0–10.5)
nRBC: 0 % (ref 0.0–0.2)

## 2021-01-31 LAB — BRAIN NATRIURETIC PEPTIDE: B Natriuretic Peptide: 9 pg/mL (ref 0.0–100.0)

## 2021-01-31 NOTE — Progress Notes (Signed)
Cardiology Office Note    Date:  02/07/2021   ID:  Shawn, Lopez 07-26-1941, MRN [id number]   PCP:  [name], MD   [facility]  Cardiologist:  [name], MD  Advanced Practice Provider:  No care team member to display Electrophysiologist:  None   [phone]   No chief complaint on file.   History of Present Illness:  Shawn Lopez is a 81 y.o. male with history of CAD status post DES to the proximal mid LAD and diagonal 12/2016, patent on repeat cath 2020, palpitations no AV nodal blockers given bradycardia.  Cardiac monitor showed PACs and PVCs with brief SVT no sustained arrhythmias. Lexi high risk because of decreased LVEF, mild peri-infarct ischemia. Echo normal LVEF 55-60%  Patient in the ED 01/31/2021 with chest pressure associated with nausea while walking around work as a Merchandiser, retail.  And eased with rest.  He became anxious and lightheaded with palpitations and went to the emergency room.  Labs stable, EKG unchanged, troponins normal.  Admission was offered but patient wanted to have work-up as an outpatient.  Patient says he had chest pressure/dull ache into his neck and feels sluggish, slight shortness of breath. Worse in am when he gets up and lasts 3-4 hrs. No worse with activity. Sometimes walking helps. Has sharp pains and burning at night-not sure if related to food. No indigestion or GI symptoms.  Works 11 hrs/day and on his feet 9 hrs/day.  Ankles swell at the end of the day.  When he had his prior stent he had left arm pain. Hasn't used NTG.  Past Medical History:  Diagnosis Date  . Arthritis   . Atrial fibrillation (HCC) 2006   a. in 2006; not on anticoagulation, no recent atrial fib seen as of 2018.  . Borderline hypertension   . CAD (coronary artery disease)    a. NSTEMI 12/2016: cath showing 95% mid-LAD stenosis, 75% prox-LAD stenosis and 75% 1st Diag stenosis. DES to the proximal and mid-LAD along with the  1st Diagonal..  . Chronic back pain    Prior trauma with vertebral fracture  . Hyperlipidemia   . Hypertension   . Osteopenia   . Sinus bradycardia   . Type 2 diabetes mellitus (HCC)     Past Surgical History:  Procedure Laterality Date  . APPENDECTOMY  1960  . CATARACT EXTRACTION  2012   right eye,lens implantation  . CHOLECYSTECTOMY    . CIRCUMCISION  2010   meatotomy and dilatation  . COLONOSCOPY N/A 01/14/2013   Procedure: COLONOSCOPY;  Surgeon: Dalia Heading, MD;  Location: [facility];  Service: Gastroenterology;  Laterality: N/A;  . CORONARY ANGIOGRAPHY N/A 01/15/2017   Procedure: Coronary Angiography;  Surgeon: Runell Gess, MD;  Location: [facility] INVASIVE CV LAB;  Service: Cardiovascular;  Laterality: N/A;  . CORONARY STENT INTERVENTION N/A 01/15/2017   Procedure: Coronary Stent Intervention;  Surgeon: Runell Gess, MD;  Location: [facility] INVASIVE CV LAB;  Service: Cardiovascular;  Laterality: N/A;  . INTRAVASCULAR PRESSURE WIRE/FFR STUDY N/A 04/04/2019   Procedure: INTRAVASCULAR PRESSURE WIRE/FFR STUDY;  Surgeon: Yvonne Kendall, MD;  Location: [facility] INVASIVE CV LAB;  Service: Cardiovascular;  Laterality: N/A;  . LEFT HEART CATH AND CORONARY ANGIOGRAPHY N/A 04/04/2019   Procedure: LEFT HEART CATH AND CORONARY ANGIOGRAPHY;  Surgeon: Yvonne Kendall, MD;  Location: [facility] INVASIVE CV LAB;  Service: Cardiovascular;  Laterality: N/A;    Current Medications: Current Meds  Medication Sig  . amLODipine (  NORVASC) 5 MG tablet Take 0.5 tablets (2.5 mg total) by mouth daily.  Marland Kitchen aspirin EC 81 MG EC tablet Take 1 tablet (81 mg total) by mouth daily.  Marland Kitchen atorvastatin (LIPITOR) 80 MG tablet Take 1 tablet (80 mg total) by mouth daily at 6 PM.  . clopidogrel (PLAVIX) 75 MG tablet Take 1 tablet (75 mg total) by mouth daily.  Marland Kitchen glimepiride (AMARYL) 2 MG tablet Take 2 mg by mouth daily.  Marland Kitchen HYDROcodone-acetaminophen (NORCO) 10-325 MG tablet Take 1 tablet by mouth every 6 (six) hours as needed for  moderate pain.   Marland Kitchen lisinopril (ZESTRIL) 10 MG tablet Take 1 tablet (10 mg total) by mouth daily.  . meclizine (ANTIVERT) 12.5 MG tablet Take 1 tablet (12.5 mg total) by mouth 3 (three) times daily as needed for dizziness.  . nitroGLYCERIN (NITROSTAT) 0.4 MG SL tablet Place 1 tablet (0.4 mg total) under the tongue every 5 (five) minutes as needed for chest pain. For 3 doses only     Allergies:   Brilinta [ticagrelor]   Social History   Socioeconomic History  . Marital status: Married    Spouse name: Not on file  . Number of children: 4  . Years of education: Not on file  . Highest education level: Not on file  Occupational History  . Occupation: Holiday representative    Comment: Ecologist  Tobacco Use  . Smoking status: Former Smoker    Packs/day: 1.50    Years: 15.00    Pack years: 22.50    Types: Cigarettes    Quit date: 04/20/1997    Years since quitting: 23.8  . Smokeless tobacco: Never Used  Vaping Use  . Vaping Use: Never used  Substance and Sexual Activity  . Alcohol use: No  . Drug use: No  . Sexual activity: Not on file  Other Topics Concern  . Not on file  Social History Narrative  . Not on file   Social Determinants of Health   Financial Resource Strain: Not on file  Food Insecurity: Not on file  Transportation Needs: Not on file  Physical Activity: Not on file  Stress: Not on file  Social Connections: Not on file     Family History:  The patient's family history includes CAD in his father; Heart attack in his brother.   ROS:   Please see the history of present illness.    ROS All other systems reviewed and are negative.   PHYSICAL EXAM:   VS:  BP 140/72 (BP Location: Left Arm, Patient Position: Sitting, Cuff Size: Large)   Pulse (!) 56   Ht 5\' 10"  (1.778 m)   Wt 195 lb (88.5 kg)   SpO2 95%   BMI 27.98 kg/m   Physical Exam  GEN: Well nourished, well developed, in no acute distress  Neck: no JVD, carotid bruits, or masses Cardiac:RRR;  no murmurs, rubs, or gallops  Respiratory:  clear to auscultation bilaterally, normal work of breathing GI: soft, nontender, nondistended, + BS Ext: without cyanosis, clubbing, or edema, Good distal pulses bilaterally Neuro:  Alert and Oriented x 3 Psych: euthymic mood, full affect  Wt Readings from Last 3 Encounters:  02/07/21 195 lb (88.5 kg)  01/31/21 185 lb (83.9 kg)  08/24/20 196 lb 3.2 oz (89 kg)      Studies/Labs Reviewed:   EKG:  EKG is not ordered today.  The ekg reviewed from ED and no change Recent Labs: 01/31/2021: B Natriuretic Peptide 9.0; BUN 17; Creatinine, Ser  0.96; Hemoglobin 15.0; Platelets 218; Potassium 4.2; Sodium 134   Lipid Panel    Component Value Date/Time   CHOL 123 01/15/2017 0702   TRIG 158 (H) 01/15/2017 0702   HDL 36 (L) 01/15/2017 0702   CHOLHDL 3.4 01/15/2017 0702   VLDL 32 01/15/2017 0702   LDLCALC 55 01/15/2017 0702    Additional studies/ records that were reviewed today include:  NST 07/23/2020 Narrative & Impression   Horizontal ST segment depression ST segment depression of 0.5 mm was noted during stress in the V5 and V6 leads. 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  This is a high risk study. Risk based on decreased LVEF, fairly mild current ischemia that alone would be considered low risk. Recommend correlating LVEF with echocardiogram.  The left ventricular ejection fraction is severely decreased (<30%).          Echocardiogram 07/23/2020 1. Left ventricular ejection fraction, by estimation, is 55 to 60%. The left ventricle has normal function. The left ventricle has no regional wall motion abnormalities. There is mild left ventricular hypertrophy. Left ventricular diastolic parameters are indeterminate. Elevated left atrial pressure. 2. Right ventricular systolic function is normal. The right ventricular size is normal. 3. Left atrial size  was moderately dilated. 4. The mitral valve is normal in structure. Trivial mitral valve regurgitation. No evidence of mitral stenosis. 5. The aortic valve is tricuspid. There is mild calcification of the aortic valve. There is mild thickening of the aortic valve. Aortic valve regurgitation is not visualized. No aortic stenosis is present. 6. The inferior vena cava is normal in size with greater than 50% respiratory variability, suggesting right atrial pressure of 3 mmHg.       Cardiac monitor 06/01/2020 Study Highlights   Preventice monitor reviewed.  30 days analyzed.  Predominant rhythm is sinus with heart rate ranging from 42 bpm up to 110 bpm and average heart rate 52 bpm.  10 beat run of SVT noted on September 15, 7 beat run of NSVT noted on September 18, and 4 beat run of NSVT noted on September 29. There were no sustained arrhythmias or pauses.  Episodes of NSVT were not clearly symptom provoking           Cardiac event monitor 12/18/2019 Study Highlights   ZIO XT reviewed.  13 days 22 hours analyzed.  Predominant rhythm is sinus with heart rate ranging from 42 bpm up to 106 bpm and average heart rate 54 bpm.  Brief runs of SVT were noted, the longest of which was only 10 beats.  There were rare PACs and PVCs representing less than 1% of total beats, also some ventricular trigeminy.  Patient triggered events did not correlate well with any specific arrhythmia/ectopy burden.  There were no pauses.       Cardiac catheterization 04/04/2019: Conclusions: 1. Mild to moderate, non-obstructive coronary artery disease, as detailed below.  Long segment of three overlapping stents in the proximal through distal LAD is patent with up to 50% in-stent restenosis in the mid section that is not hemodynamically significant at this time with a DFR of 0.90-0.91 (cut point for significance is 0.89). 2. Widely patent stent in D1 with 50% focal disease proximal to the stent, which is not hemodynamically  significant (DFR 0.95). 3. Normal left ventricular systolic function with mildly elevated filling pressure suggestive of diastolic dysfunction. 4. Sinus bradycardia with heart rates of 40-50 bpm noted throughout most of the case.  Recommendations: 1. Continue aggressive medical therapy, including indefinite dual antiplatelet therapy with aspirin and clopidogrel given long stented segment of the LAD. 2. Question if chronotropic incompetence is contributing to symptoms; I will decrease metoprolol tartrate to 12.5 twice daily. Add amlodipine 2.5 mg daily for improved blood pressure control and antianginal therapy, as Shawn Lopez has been intolerant of isosorbide mononitrate.      Risk Assessment/Calculations:         ASSESSMENT:    1. Precordial pain   2. Coronary artery disease involving native coronary artery of native heart, unspecified whether angina present   3. Essential hypertension   4. Other hyperlipidemia   5. Palpitations      PLAN:  In order of problems listed above:  Chest pain - ER visit 01/31/2021 with exertional chest pain.  EKG without acute change troponins negative x2.  Patient has multiple types of chest pain but worrisome described as pressure into his neck that last several hours in the morning.  It actually relieves with walking.  He has not used nitroglycerin due to side effects and has not tolerated isosorbide in the past.  He has sharp shooting pains at night that are different.  We will add low-dose amlodipine 2.5 mg daily to see if this helps. Low threshold for repeat cardiac cath. Discussed return to ED for prolonged chest pain.  Order exercise Myoview to rule out ischemia and follow-up with Dr. [name] 02/23/2021 as scheduled.  CAD status post DES to the proximal and mid LAD and diagonal 12/2016 patent on cath in 2020 NST 06/2020 abnormal because of low EF but mild peri-infarct ischemia, echo that same day EF 55 to 60% on Plavix and aspirin.     Hypertension controlled on lisinopril  Hyperlipidemia on lipitor managed by PCP  History of palpitations with PACs and PVCs and brief SVT on Holter monitor last year no beta-blocker because of bradycardia  Shared Decision Making/Informed Consent   Shared Decision Making/Informed Consent The risks [chest pain, shortness of breath, cardiac arrhythmias, dizziness, blood pressure fluctuations, myocardial infarction, stroke/transient ischemic attack, nausea, vomiting, allergic reaction, radiation exposure, metallic taste sensation and life-threatening complications (estimated to be 1 in 10,000)], benefits (risk stratification, diagnosing coronary artery disease, treatment guidance) and alternatives of a nuclear stress test were discussed in detail with Shawn Lopez and he agrees to proceed.       Medication Adjustments/Labs and Tests Ordered: Current medicines are reviewed at length with the patient today.  Concerns regarding medicines are outlined above.  Medication changes, Labs and Tests ordered today are listed in the Patient Instructions below. Patient Instructions  Medication Instructions:  START Amlodipine 5 mg,  Take 1/2 tablet (2.5 mg ) daily   *If you need a refill on your cardiac medications before your next appointment, please call your pharmacy*   Lab Work: None today If you have labs (blood work) drawn today and your tests are completely normal, you will receive your results only by: Marland Kitchen MyChart Message (if you have MyChart) OR . A paper copy in the mail If you have any lab test that is abnormal or we need to change your treatment, we will call you to review the results.   Testing/Procedures:   Your physician has requested that you have en exercise stress myoview. For further information please visit [email or web address] Please follow instruction sheet, as given.    Follow-Up: At [facility], you and your health needs are our priority.  As part of our continuing  mission to  provide you with exceptional heart care, we have created designated Provider Care Teams.  These Care Teams include your primary Cardiologist (physician) and Advanced Practice Providers (APPs -  Physician Assistants and Nurse Practitioners) who all work together to provide you with the care you need, when you need it.  We recommend signing up for the patient portal called "MyChart".  Sign up information is provided on this After Visit Summary.  MyChart is used to connect with patients for Virtual Visits (Telemedicine).  Patients are able to view lab/test results, encounter notes, upcoming appointments, etc.  Non-urgent messages can be sent to your provider as well.   To learn more about what you can do with MyChart, go to [email or web address].    Your next appointment:  Keep 02/23/21 appointment already scheduled     Signed, Jacolyn ReedyMichele Elaysia Devargas, PA-C  02/07/2021 12:13 PM    [facility] [address], [place], [place]  [address] Phone: [phone]; Fax: [phone]

## 2021-01-31 NOTE — Discharge Instructions (Addendum)
You were seen in the emergency department today for your chest pain and palpitations. Your physical exam, vital signs, and work-up were very reassuring. There are no signs of heart attack at this time. Your symptoms are concerning for possible cardiac source, so you should follow-up with your cardiologist, Dr. [name], this week.  I was able to call their office and have secured an appointment for you on Monday 5/16 at 11:15 AM.  Please be sure to arrive promptly to this appointment as this follow-up is very important.  Return to the emergency department if you develop any worsening chest pain, shortness of breath, palpitation, or any other new severe symptoms.

## 2021-01-31 NOTE — ED Provider Notes (Signed)
[facility] EMERGENCY DEPARTMENT Provider Note   CSN: [id number] Arrival date & time: 01/31/21  1112     History Chief Complaint  Patient presents with  . Chest Pain    Shawn Lopez is a 80 y.o. male who presents with concern for left-sided chest pain and pressure with mild associated nausea that occurred with exertion this morning.  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  He states that the discomfort resolved when he rested but was repeated each time he ambulated work this morning.  He began to feel very nervous and had intermittent lightheadedness and palpitations associated with the discomfort.  He did not take any nitroglycerin at home.  Patient is pain-free at time of my initial exam.  Patient does have history of coronary artery disease with left heart catheterization in July 2020.  There are multiple stents in place in the LAD, and patient is on dual antiplatelet therapy with aspirin clopidogrel.  Patient had echocardiogram in October 2021 which revealed LVEF of 55 to 60%.  He has not seen cardiology since November 2021.  He does have appointment scheduled for February 23, 2021.  Cardiologist is Dr. [name].   HPI  HPI: A 80 year old patient with a history of treated diabetes, hypertension and hypercholesterolemia presents for evaluation of chest pain. Initial onset of pain was approximately 3-6 hours ago. The patient's chest pain is described as heaviness/pressure/tightness, is sharp and is worse with exertion. The patient complains of nausea. The patient's chest pain is middle- or left-sided, is not well-localized and does not radiate to the arms/jaw/neck. The patient denies diaphoresis. The patient has a family history of coronary artery disease in a first-degree relative with onset less than age 32. The patient has no history of stroke, has no history of peripheral artery disease, has not  smoked in the past 90 days and does not have an elevated BMI (>=30).   Past Medical History:  Diagnosis Date  . Arthritis   . Atrial fibrillation (HCC) 2006   a. in 2006; not on anticoagulation, no recent atrial fib seen as of 2018.  . Borderline hypertension   . CAD (coronary artery disease)    a. NSTEMI 12/2016: cath showing 95% mid-LAD stenosis, 75% prox-LAD stenosis and 75% 1st Diag stenosis. DES to the proximal and mid-LAD along with the 1st Diagonal..  . Chronic back pain    Prior trauma with vertebral fracture  . Hyperlipidemia   . Hypertension   . Osteopenia   . Sinus bradycardia   . Type 2 diabetes mellitus [facility])     Patient Active Problem List   Diagnosis Date Noted  . 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 CAD (coronary artery disease) with PCI 12/2016 06/05/2017  . Chest pain 01/13/2017  . NSTEMI (non-ST elevated myocardial infarction) (HCC) 01/13/2017  . Pain in joint, shoulder region 10/20/2013  . Muscle spasm of right shoulder 10/20/2013  . Right rotator cuff tear 10/14/2013  . 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 Atrial fibrillation (HCC)   . Essential hypertension   . Hyperlipidemia LDL goal <70   . Diabetes mellitus (HCC)   . Osteopenia   . Chronic back pain   . Chest pain     Past Surgical History:  Procedure Laterality Date  . APPENDECTOMY  1960  . CATARACT EXTRACTION  2012   right eye,lens implantation  . CHOLECYSTECTOMY    .  CIRCUMCISION  2010   meatotomy and dilatation  . COLONOSCOPY N/A 01/14/2013   Procedure: COLONOSCOPY;  Surgeon: Dalia Heading, MD;  Location: [facility] ENDO [facility];  Service: Gastroenterology;  Laterality: N/A;  . CORONARY ANGIOGRAPHY N/A 01/15/2017   Procedure: Coronary Angiography;  Surgeon: Runell Gess, MD;  Location: [facility] INVASIVE CV LAB;  Service: Cardiovascular;  Laterality: N/A;  . CORONARY STENT INTERVENTION N/A 01/15/2017   Procedure: Coronary Stent Intervention;  Surgeon: Runell Gess, MD;  Location: [facility] INVASIVE  CV LAB;  Service: Cardiovascular;  Laterality: N/A;  . INTRAVASCULAR PRESSURE WIRE/FFR STUDY N/A 04/04/2019   Procedure: INTRAVASCULAR PRESSURE WIRE/FFR STUDY;  Surgeon: Yvonne Kendall, MD;  Location: [facility] INVASIVE CV LAB;  Service: Cardiovascular;  Laterality: N/A;  . LEFT HEART CATH AND CORONARY ANGIOGRAPHY N/A 04/04/2019   Procedure: LEFT HEART CATH AND CORONARY ANGIOGRAPHY;  Surgeon: Yvonne Kendall, MD;  Location: [facility] INVASIVE CV LAB;  Service: Cardiovascular;  Laterality: N/A;       Family History  Problem Relation Age of Onset  . CAD Father   . Heart attack Brother     Social History   Tobacco Use  . Smoking status: Former Smoker    Packs/day: 1.50    Years: 15.00    Pack years: 22.50    Types: Cigarettes    Quit date: 04/20/1997    Years since quitting: 23.8  . Smokeless tobacco: Never Used  Vaping Use  . Vaping Use: Never used  Substance Use Topics  . Alcohol use: No  . Drug use: No    Home Medications Prior to Admission medications   Medication Sig Start Date End Date Taking? Authorizing Provider  aspirin EC 81 MG EC tablet Take 1 tablet (81 mg total) by mouth daily. 01/17/17  Yes Ghimire, Werner Lean, MD  atorvastatin (LIPITOR) 80 MG tablet Take 1 tablet (80 mg total) by mouth daily at 6 PM. 03/09/17  Yes Jodelle Gross, NP  clopidogrel (PLAVIX) 75 MG tablet Take 1 tablet (75 mg total) by mouth daily. 03/09/17  Yes Jodelle Gross, NP  glimepiride (AMARYL) 2 MG tablet Take 2 mg by mouth daily. 05/18/20  Yes [provider]  HYDROcodone-acetaminophen (NORCO) 10-325 MG tablet Take 1 tablet by mouth every 6 (six) hours as needed for moderate pain.  08/02/17  Yes [provider]  lisinopril (ZESTRIL) 10 MG tablet Take 1 tablet (10 mg total) by mouth daily. 04/16/20  Yes Netta Neat., NP  meclizine (ANTIVERT) 12.5 MG tablet Take 1 tablet (12.5 mg total) by mouth 3 (three) times daily as needed for dizziness. 05/03/20  Yes Long, Arlyss Repress, MD   nitroGLYCERIN (NITROSTAT) 0.4 MG SL tablet Place 1 tablet (0.4 mg total) under the tongue every 5 (five) minutes as needed for chest pain. For 3 doses only 01/16/17   Ghimire, Werner Lean, MD    Allergies    Brilinta [ticagrelor]  Review of Systems   Review of Systems  Constitutional: Negative.   HENT: Negative.   Respiratory: Negative.   Cardiovascular: Positive for chest pain and palpitations. Negative for leg swelling.  Gastrointestinal: Positive for nausea. Negative for abdominal distention, abdominal pain, anal bleeding, blood in stool, constipation, diarrhea, rectal pain and vomiting.  Genitourinary: Negative.   Musculoskeletal: Negative.   Neurological: Positive for light-headedness. Negative for dizziness, tremors, facial asymmetry, weakness and headaches.  Psychiatric/Behavioral: The patient is nervous/anxious.     Physical Exam Updated Vital Signs BP 137/73   Pulse (!) 51  Temp 98.3 F (36.8 C) (Oral)   Resp 17   Ht 5\' 10"  (1.778 m)   Wt 83.9 kg   SpO2 97%   BMI 26.54 kg/m   Physical Exam Vitals and nursing note reviewed.  Constitutional:      Appearance: He is overweight. He is not ill-appearing or toxic-appearing.  HENT:     Head: Normocephalic and atraumatic.     Nose: Nose normal.     Mouth/Throat:     Mouth: Mucous membranes are moist.     Pharynx: Oropharynx is clear. Uvula midline. No oropharyngeal exudate, posterior oropharyngeal erythema or uvula swelling.     Tonsils: No tonsillar exudate.  Eyes:     General: Lids are normal. Vision grossly intact.        Right eye: No discharge.        Left eye: No discharge.     Extraocular Movements: Extraocular movements intact.     Conjunctiva/sclera: Conjunctivae normal.     Pupils: Pupils are equal, round, and reactive to light.  Neck:     Trachea: Trachea and phonation normal.  Cardiovascular:     Rate and Rhythm: Normal rate and regular rhythm.     Pulses: Normal pulses.     Heart sounds: Murmur  heard.   Systolic murmur is present with a grade of 1/6.   Pulmonary:     Effort: Pulmonary effort is normal. No tachypnea, bradypnea, accessory muscle usage, prolonged expiration or respiratory distress.     Breath sounds: Normal breath sounds. No wheezing or rales.  Chest:     Chest wall: No mass, lacerations, deformity, swelling, tenderness, crepitus or edema.  Abdominal:     General: Bowel sounds are normal. There is no distension.     Palpations: Abdomen is soft.     Tenderness: There is no abdominal tenderness. There is no right CVA tenderness, left CVA tenderness, guarding or rebound.  Musculoskeletal:        General: No deformity.     Cervical back: Normal range of motion and neck supple. No edema, rigidity or crepitus. No pain with movement or spinous process tenderness.     Right lower leg: No edema.     Left lower leg: No edema.  Lymphadenopathy:     Cervical: No cervical adenopathy.  Skin:    General: Skin is warm and dry.  Neurological:     General: No focal deficit present.     Mental Status: He is alert and oriented to person, place, and time. Mental status is at baseline.     Gait: Gait is intact.  Psychiatric:        Mood and Affect: Mood normal.     ED Results / Procedures / Treatments   Labs (all labs ordered are listed, but only abnormal results are displayed) Labs Reviewed  BASIC METABOLIC PANEL - Abnormal; Notable for the following components:      Result Value   Sodium 134 (*)    Glucose, Bld 198 (*)    All other components within normal limits  CBC  BRAIN NATRIURETIC PEPTIDE  TROPONIN I (HIGH SENSITIVITY)  TROPONIN I (HIGH SENSITIVITY)    EKG EKG Interpretation  Date/Time:  Monday Jan 31 2021 11:53:41 EDT Ventricular Rate:  60 PR Interval:  154 QRS Duration: 100 QT Interval:  408 QTC Calculation: 408 R Axis:   53 Text Interpretation: Normal sinus rhythm Nonspecific T wave abnormality Confirmed by Cathren LaineSteinl, Kevin ([id number]) on 01/31/2021  11:56:15 AM  Radiology DG Chest 2 View  Result Date: 01/31/2021 CLINICAL DATA:  Chest pain EXAM: CHEST - 2 VIEW COMPARISON:  05/03/2020 FINDINGS: Normal heart size. Decreased lung volumes. No pleural effusion or edema. No airspace opacities. Spondylosis noted within the thoracolumbar spine. IMPRESSION: Low lung volumes.  No acute cardiopulmonary abnormalities. Electronically Signed   By: Signa Kell M.D.   On: 01/31/2021 12:49    Procedures Procedures   Medications Ordered in ED Medications - No data to display  ED Course  I have reviewed the triage vital signs and the nursing notes.  Pertinent labs & imaging results that were available during my care of the patient were reviewed by me and considered in my medical decision making (see chart for details).    MDM Rules/Calculators/A&P HEAR Score: 30                       80 year old male presents for concern for exertional left-sided chest pain that resolves at rest, history of coronary artery disease on aspirin and Plavix.  Differential diagnosis includes but is limited to stable angina, MI, pleural effusion, pneumonia, dysrhythmia, PE.  Hypertensive on intake, vital signs otherwise normal.  Cardiopulmonary exam is normal, abdominal exam is benign.  Patient is neurologically intact and neurovascularly intact without lower extremity edema.  EKG with normal sinus rhythm, ventricular rate of 60, nonspecific T wave changes.  Chest x-ray negative for acute cardiopulmonary disease.  CBC unremarkable, BMP unremarkable, BNP negative.   Troponin normal, 5, delta troponin 6.   Patient ambulated to and from the restroom in the emergency department without symptoms and remains chest pain-free throughout his stay in the emergency department.  He does have an elevated heart score 6, however given 2 normal troponins, and normal EKG, no indication of current ischemic event.  Per heart score recommendations, patient is eligible for medical admission  for observation versus outpatient management with close follow-up.  Shared decision making with the patient who has a good relationship with his cardiologist and prefers to be discharged home with close outpatient follow-up. Case also discussed with attending physician, who agrees with disposition plan.  No further work-up warranted in the ED as time.  Jasun voiced understanding of his medical evaluation and treatment plan.  Each of his questions was answered to his expressed satisfaction.  Strict return precautions are given.  Patient is well-appearing, stable, and appropriate for discharge at this time.  At the patient's request, I was able to contact Dr. Ival Bible office and secure an appointment for him on Monday, 02/07/2021 at 11:30 AM in the [place] office.  Patient has been made aware and is agreeable to follow-up.  This chart was dictated using voice recognition software, Dragon. Despite the best efforts of this provider to proofread and correct errors, errors may still occur which can change documentation meaning.  Final Clinical Impression(s) / ED Diagnoses Final diagnoses:  None    Rx / DC Orders ED Discharge Orders    None       Sherrilee Gilles 01/31/21 1550    Cathren Laine, MD 01/31/21 [phone]

## 2021-01-31 NOTE — ED Triage Notes (Signed)
Pt reports feeling very nervous with palpitations and dizziness intermittently since this am with mild left sided chest pain that has not subsided; pt has hx of stent placement

## 2021-02-07 ENCOUNTER — Encounter: Payer: Self-pay | Admitting: Physician Assistant

## 2021-02-07 ENCOUNTER — Ambulatory Visit: Payer: Medicare HMO | Admitting: Physician Assistant

## 2021-02-07 ENCOUNTER — Other Ambulatory Visit: Payer: Self-pay

## 2021-02-07 VITALS — BP 140/72 | HR 56 | Ht 70.0 in | Wt 195.0 lb

## 2021-02-07 DIAGNOSIS — E7849 Other hyperlipidemia: Secondary | ICD-10-CM

## 2021-02-07 DIAGNOSIS — I2510 Atherosclerotic heart disease of native coronary artery without angina pectoris: Secondary | ICD-10-CM | POA: Diagnosis not present

## 2021-02-07 DIAGNOSIS — I10 Essential (primary) hypertension: Secondary | ICD-10-CM

## 2021-02-07 DIAGNOSIS — R072 Precordial pain: Secondary | ICD-10-CM | POA: Diagnosis not present

## 2021-02-07 DIAGNOSIS — R002 Palpitations: Secondary | ICD-10-CM

## 2021-02-07 MED ORDER — AMLODIPINE BESYLATE 5 MG PO TABS: 2.5000 mg | ORAL_TABLET | Freq: Every day | ORAL | 3 refills | Status: DC

## 2021-02-07 NOTE — Patient Instructions (Signed)
Medication Instructions:  START Amlodipine 5 mg,  Take 1/2 tablet (2.5 mg ) daily   *If you need a refill on your cardiac medications before your next appointment, please call your pharmacy*   Lab Work: None today If you have labs (blood work) drawn today and your tests are completely normal, you will receive your results only by: Marland Kitchen MyChart Message (if you have MyChart) OR . A paper copy in the mail If you have any lab test that is abnormal or we need to change your treatment, we will call you to review the results.   Testing/Procedures:   Your physician has requested that you have en exercise stress myoview. For further information please visit [email or web address] Please follow instruction sheet, as given.    Follow-Up: At [facility], you and your health needs are our priority.  As part of our continuing mission to provide you with exceptional heart care, we have created designated Provider Care Teams.  These Care Teams include your primary Cardiologist (physician) and Advanced Practice Providers (APPs -  Physician Assistants and Nurse Practitioners) who all work together to provide you with the care you need, when you need it.  We recommend signing up for the patient portal called "MyChart".  Sign up information is provided on this After Visit Summary.  MyChart is used to connect with patients for Virtual Visits (Telemedicine).  Patients are able to view lab/test results, encounter notes, upcoming appointments, etc.  Non-urgent messages can be sent to your provider as well.   To learn more about what you can do with MyChart, go to [email or web address].    Your next appointment:  Keep 02/23/21 appointment already scheduled

## 2021-02-09 ENCOUNTER — Other Ambulatory Visit: Payer: Self-pay

## 2021-02-09 ENCOUNTER — Encounter (HOSPITAL_BASED_OUTPATIENT_CLINIC_OR_DEPARTMENT_OTHER)
Admission: RE | Admit: 2021-02-09 | Discharge: 2021-02-09 | Disposition: A | Discharge status: Home / Self Care | Payer: Medicare HMO | Source: Ambulatory Visit | Attending: Physician Assistant | Admitting: Physician Assistant

## 2021-02-09 ENCOUNTER — Encounter (HOSPITAL_COMMUNITY)
Admission: RE | Admit: 2021-02-09 | Discharge: 2021-02-09 | Disposition: A | Discharge status: Home / Self Care | Payer: Medicare HMO | Source: Ambulatory Visit | Attending: Physician Assistant | Admitting: Physician Assistant

## 2021-02-09 ENCOUNTER — Encounter (HOSPITAL_COMMUNITY): Payer: Self-pay

## 2021-02-09 DIAGNOSIS — R072 Precordial pain: Secondary | ICD-10-CM | POA: Diagnosis not present

## 2021-02-09 LAB — NM MYOCAR MULTI W/SPECT W/WALL MOTION / EF
Estimated workload: 8.1 METS
Exercise duration (min): 8 min
Exercise duration (sec): 0 s
LV dias vol: 97 mL (ref 62–150)
LV sys vol: 42 mL
MPHR: 141 {beats}/min
Peak HR: 122 {beats}/min
Percent HR: 86 %
RATE: 0.39
RPE: 17
Rest HR: 49 {beats}/min
SDS: 2
SRS: 0
SSS: 2
TID: 1.22

## 2021-02-09 MED ORDER — TECHNETIUM TC 99M TETROFOSMIN IV KIT
30.0000 | PACK | Freq: Once | INTRAVENOUS | Status: AC | PRN
  Administered 2021-02-09: 32 via INTRAVENOUS

## 2021-02-09 MED ORDER — REGADENOSON 0.4 MG/5ML IV SOLN
INTRAVENOUS | Status: AC
  Filled 2021-02-09: qty 5

## 2021-02-09 MED ORDER — TECHNETIUM TC 99M TETROFOSMIN IV KIT
10.0000 | PACK | Freq: Once | INTRAVENOUS | Status: AC | PRN
  Administered 2021-02-09: 11 via INTRAVENOUS

## 2021-02-09 MED ORDER — PANTOPRAZOLE SODIUM 20 MG PO TBEC: 20.0000 mg | DELAYED_RELEASE_TABLET | Freq: Every day | ORAL | 0 refills | Status: DC

## 2021-02-09 MED ORDER — SODIUM CHLORIDE FLUSH 0.9 % IV SOLN
INTRAVENOUS | Status: AC
  Administered 2021-02-09: 10 mL via INTRAVENOUS
  Filled 2021-02-09: qty 10

## 2021-02-22 NOTE — Progress Notes (Deleted)
Cardiology Office Note  Date: 02/22/2021   ID: Shawn Lopez, Shawn Lopez 11-Feb-1941, MRN [id number]  PCP:  [name], MD  Cardiologist:  [name], MD Electrophysiologist:  None   No chief complaint on file.   History of Present Illness: Shawn Lopez is a 80 y.o. male seen recently in May by Ms. Lilla Shook, I reviewed the note.  Low-dose Norvasc was added at the last visit as an antianginal.  Follow-up exercise Myoview was low risk as noted below.  Past Medical History:  Diagnosis Date  . Arthritis   . Atrial fibrillation (HCC) 2006   a. in 2006; not on anticoagulation, no recent atrial fib seen as of 2018.  . Borderline hypertension   . CAD (coronary artery disease)    a. NSTEMI 12/2016: cath showing 95% mid-LAD stenosis, 75% prox-LAD stenosis and 75% 1st Diag stenosis. DES to the proximal and mid-LAD along with the 1st Diagonal..  . Chronic back pain    Prior trauma with vertebral fracture  . Hyperlipidemia   . Hypertension   . Osteopenia   . Sinus bradycardia   . Type 2 diabetes mellitus (HCC)     Past Surgical History:  Procedure Laterality Date  . APPENDECTOMY  1960  . CATARACT EXTRACTION  2012   right eye,lens implantation  . CHOLECYSTECTOMY    . CIRCUMCISION  2010   meatotomy and dilatation  . COLONOSCOPY N/A 01/14/2013   Procedure: COLONOSCOPY;  Surgeon: Dalia Heading, MD;  Location[facility] END[facility];  Service: Gastroenterology;  Laterality: N/A;  . CORONARY ANGIOGRAPHY N/A 01/15/2017   Procedure: Coronary Angiography;  Surgeon: Runell Gess, MD;  Location: [facility] INVASIVE CV LAB;  Service: Cardiovascular;  Laterality: N/A;  . CORONARY STENT INTERVENTION N/A 01/15/2017   Procedure: Coronary Stent Intervention;  Surgeon: Runell Gess, MD;  Location: [facility] INVASIVE CV LAB;  Service: Cardiovascular;  Laterality: N/A;  . INTRAVASCULAR PRESSURE WIRE/FFR STUDY N/A 04/04/2019   Procedure: INTRAVASCULAR PRESSURE WIRE/FFR STUDY;  Surgeon: Yvonne Kendall, MD;  Location: [facility] INVASIVE CV LAB;  Service: Cardiovascular;  Laterality: N/A;  . LEFT HEART CATH AND CORONARY ANGIOGRAPHY N/A 04/04/2019   Procedure: LEFT HEART CATH AND CORONARY ANGIOGRAPHY;  Surgeon: Yvonne Kendall, MD;  Location: [facility] INVASIVE CV LAB;  Service: Cardiovascular;  Laterality: N/A;    Current Outpatient Medications  Medication Sig Dispense Refill  . amLODipine (NORVASC) 5 MG tablet Take 0.5 tablets (2.5 mg total) by mouth daily. 45 tablet 3  . aspirin EC 81 MG EC tablet Take 1 tablet (81 mg total) by mouth daily. 30 tablet 0  . atorvastatin (LIPITOR) 80 MG tablet Take 1 tablet (80 mg total) by mouth daily at 6 PM. 90 tablet 3  . clopidogrel (PLAVIX) 75 MG tablet Take 1 tablet (75 mg total) by mouth daily. 90 tablet 3  . glimepiride (AMARYL) 2 MG tablet Take 2 mg by mouth daily.    Marland Kitchen HYDROcodone-acetaminophen (NORCO) 10-325 MG tablet Take 1 tablet by mouth every 6 (six) hours as needed for moderate pain.   0  . lisinopril (ZESTRIL) 10 MG tablet Take 1 tablet (10 mg total) by mouth daily. 30 tablet 6  . meclizine (ANTIVERT) 12.5 MG tablet Take 1 tablet (12.5 mg total) by mouth 3 (three) times daily as needed for dizziness. 30 tablet 0  . nitroGLYCERIN (NITROSTAT) 0.4 MG SL tablet Place 1 tablet (0.4 mg total) under the tongue every 5 (five) minutes as needed for chest pain. For 3 doses  only 30 tablet 0  . pantoprazole (PROTONIX) 20 MG tablet Take 1 tablet (20 mg total) by mouth daily. Additional refills from PCP 90 tablet 0   No current facility-administered medications for this visit.   Allergies:  Brilinta [ticagrelor]   Social History: The patient  reports that he quit smoking about 23 years ago. His smoking use included cigarettes. He has a 22.50 pack-year smoking history. He has never used smokeless tobacco. He reports that he does not drink alcohol and does not use drugs.   Family History: The patient's family history includes CAD in his father; Heart attack  in his brother.   ROS:  Please see the history of present illness. Otherwise, complete review of systems is positive for {NONE DEFAULTED:18576::"none"}.  All other systems are reviewed and negative.   Physical Exam: VS:  There were no vitals taken for this visit., BMI There is no height or weight on file to calculate BMI.  Wt Readings from Last 3 Encounters:  02/07/21 195 lb (88.5 kg)  01/31/21 185 lb (83.9 kg)  08/24/20 196 lb 3.2 oz (89 kg)    General: Patient appears comfortable at rest. HEENT: Conjunctiva and lids normal, oropharynx clear with moist mucosa. Neck: Supple, no elevated JVP or carotid bruits, no thyromegaly. Lungs: Clear to auscultation, nonlabored breathing at rest. Cardiac: Regular rate and rhythm, no S3 or significant systolic murmur, no pericardial rub. Abdomen: Soft, nontender, no hepatomegaly, bowel sounds present, no guarding or rebound. Extremities: No pitting edema, distal pulses 2+. Skin: Warm and dry. Musculoskeletal: No kyphosis. Neuropsychiatric: Alert and oriented x3, affect grossly appropriate.  ECG:  An ECG dated 01/31/2021 was personally reviewed today and demonstrated:  Sinus rhythm with nonspecific T wave changes.  Recent Labwork: 01/31/2021: B Natriuretic Peptide 9.0; BUN 17; Creatinine, Ser 0.96; Hemoglobin 15.0; Platelets 218; Potassium 4.2; Sodium 134     Component Value Date/Time   CHOL 123 01/15/2017 0702   TRIG 158 (H) 01/15/2017 0702   HDL 36 (L) 01/15/2017 0702   CHOLHDL 3.4 01/15/2017 0702   VLDL 32 01/15/2017 0702   LDLCALC 55 01/15/2017 0702    Other Studies Reviewed Today:  Echocardiogram 07/23/2020: 1. Left ventricular ejection fraction, by estimation, is 55 to 60%. The  left ventricle has normal function. The left ventricle has no regional  wall motion abnormalities. There is mild left ventricular hypertrophy.  Left ventricular diastolic parameters  are indeterminate. Elevated left atrial pressure.  2. Right ventricular  systolic function is normal. The right ventricular  size is normal.  3. Left atrial size was moderately dilated.  4. The mitral valve is normal in structure. Trivial mitral valve  regurgitation. No evidence of mitral stenosis.  5. The aortic valve is tricuspid. There is mild calcification of the  aortic valve. There is mild thickening of the aortic valve. Aortic valve  regurgitation is not visualized. No aortic stenosis is present.  6. The inferior vena cava is normal in size with greater than 50%  respiratory variability, suggesting right atrial pressure of 3 mmHg.   Exercise Myoview 02/09/2021:  Blood pressure demonstrated a hypertensive response to exercise.  There was no ST segment deviation noted during stress.  The study is normal. There are no perfusion defects  This is a low risk study.  The left ventricular ejection fraction is normal (55-65%).  Not able to calculate Duke treadmill score, protocol held at stage 2 due to shortness of breath.  Assessment and Plan:    Medication Adjustments/Labs and Tests  Ordered: Current medicines are reviewed at length with the patient today.  Concerns regarding medicines are outlined above.   Tests Ordered: No orders of the defined types were placed in this encounter.   Medication Changes: No orders of the defined types were placed in this encounter.   Disposition:  Follow up {follow up:15908}  Signed, Jonelle Sidle, MD, [facility] 02/22/2021 10:21 AM    [facility] [address] [place], [place], [place] [address] Phone: [phone]; Fax: [phone]

## 2021-02-23 ENCOUNTER — Ambulatory Visit: Payer: Medicare HMO | Admitting: Cardiology

## 2021-02-23 DIAGNOSIS — I25119 Atherosclerotic heart disease of native coronary artery with unspecified angina pectoris: Secondary | ICD-10-CM

## 2021-05-18 ENCOUNTER — Other Ambulatory Visit: Payer: Self-pay | Admitting: Family Medicine

## 2021-05-18 ENCOUNTER — Other Ambulatory Visit (HOSPITAL_COMMUNITY): Payer: Self-pay | Admitting: Family Medicine

## 2021-05-18 DIAGNOSIS — N50811 Right testicular pain: Secondary | ICD-10-CM

## 2021-05-23 ENCOUNTER — Ambulatory Visit (HOSPITAL_COMMUNITY)
Admission: RE | Admit: 2021-05-23 | Discharge: 2021-05-23 | Disposition: A | Discharge status: Home / Self Care | Payer: Medicare HMO | Source: Ambulatory Visit | Attending: Family Medicine | Admitting: Family Medicine

## 2021-05-23 DIAGNOSIS — N50811 Right testicular pain: Secondary | ICD-10-CM | POA: Insufficient documentation

## 2021-06-02 ENCOUNTER — Ambulatory Visit: Payer: Medicare HMO | Admitting: Urology

## 2021-06-23 ENCOUNTER — Ambulatory Visit (INDEPENDENT_AMBULATORY_CARE_PROVIDER_SITE_OTHER): Payer: Medicare HMO | Admitting: Urology

## 2021-06-23 ENCOUNTER — Other Ambulatory Visit: Payer: Self-pay

## 2021-06-23 ENCOUNTER — Encounter: Payer: Self-pay | Admitting: Urology

## 2021-06-23 VITALS — BP 162/73 | HR 54 | Temp 98.7°F | Ht 70.0 in | Wt 194.6 lb

## 2021-06-23 DIAGNOSIS — R1031 Right lower quadrant pain: Secondary | ICD-10-CM | POA: Diagnosis not present

## 2021-06-23 DIAGNOSIS — N403 Nodular prostate with lower urinary tract symptoms: Secondary | ICD-10-CM

## 2021-06-23 DIAGNOSIS — R351 Nocturia: Secondary | ICD-10-CM

## 2021-06-23 DIAGNOSIS — G8929 Other chronic pain: Secondary | ICD-10-CM | POA: Diagnosis not present

## 2021-06-23 LAB — URINALYSIS, ROUTINE W REFLEX MICROSCOPIC
Bilirubin, UA: NEGATIVE
Glucose, UA: NEGATIVE
Ketones, UA: NEGATIVE
Leukocytes,UA: NEGATIVE
Nitrite, UA: NEGATIVE
Protein,UA: NEGATIVE
RBC, UA: NEGATIVE
Specific Gravity, UA: 1.025 (ref 1.005–1.030)
Urobilinogen, Ur: 1 mg/dL (ref 0.2–1.0)
pH, UA: 5.5 (ref 5.0–7.5)

## 2021-06-23 NOTE — Progress Notes (Signed)
Subjective: 1. Groin pain, chronic, right   2. 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 Nocturia      02/27/20: Mr. Hull is a 80 yo WM who is sent by Jayme Cloud NP for a possible nodule on recent exam.  His PSA was only 0.6.  He has some increased urgency and had some right groin pain about 2 months ago.  The pain was mild and not associated with nausea or hematuria.  He has had no history of stones, UTI's or GU surgery.  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  His UA today is clear.  His IPSS is 5.  He has nocturia x 1 and some urgency.   He had a renal US in 2018 that was normal.  He reports a recent PSA but I don't have that.   06/23/21: Mr. Decelles returns today with a recent history of right testicular pain that began in July. He had a scrotal US in August which was essentially unremarkable.  He will have pain in the groin particularly when he lifts his leg.  He is voiding ok with minimal LUTS and nocturia x 1.  His IPSS is 6.  His only other complaint is constipation.   ROS:  ROS  Allergies  Allergen Reactions   Brilinta [Ticagrelor]     Dyspnea     Past Medical History:  Diagnosis Date   Arthritis    Atrial fibrillation (HCC) 2006   a. in 2006; not on anticoagulation, no recent atrial fib seen as of 2018.   Borderline hypertension    CAD (coronary artery disease)    a. NSTEMI 12/2016: cath showing 95% mid-LAD stenosis, 75% prox-LAD stenosis and 75% 1st Diag stenosis. DES to the proximal and mid-LAD along with the 1st Diagonal..   Chronic back pain    Prior trauma with vertebral fracture   Hyperlipidemia    Hypertension    Osteopenia    Sinus bradycardia    Type 2 diabetes mellitus (HCC)     Past Surgical History:  Procedure Laterality Date   APPENDECTOMY  1960   CATARACT EXTRACTION  2012   right eye,lens implantation   CHOLECYSTECTOMY     CIRCUMCISION  2010   meatotomy and dilatation   COLONOSCOPY N/A 01/14/2013   Procedure:  COLONOSCOPY;  Surgeon: Dalia Heading, MD;  [facility];  Service: Gastroenterology;  Laterality: N/A;   CORONARY ANGIOGRAPHY N/A 01/15/2017   Procedure: Coronary Angiography;  Surgeon: Runell Gess, MD;  Location: [facility] INVASIVE CV LAB;  Service: Cardiovascular;  Laterality: N/A;   CORONARY STENT INTERVENTION N/A 01/15/2017   Procedure: Coronary Stent Intervention;  Surgeon: Runell Gess, MD;  Location: [facility] INVASIVE CV LAB;  Service: Cardiovascular;  Laterality: N/A;   INTRAVASCULAR PRESSURE WIRE/FFR STUDY N/A 04/04/2019   Procedure: INTRAVASCULAR PRESSURE WIRE/FFR STUDY;  Surgeon: Yvonne Kendall, MD;  Location: [facility] INVASIVE CV LAB;  Service: Cardiovascular;  Laterality: N/A;   LEFT HEART CATH AND CORONARY ANGIOGRAPHY N/A 04/04/2019   Procedure: LEFT HEART CATH AND CORONARY ANGIOGRAPHY;  Surgeon: Yvonne Kendall, MD;  Location: [facility] INVASIVE CV LAB;  Service: Cardiovascular;  Laterality: N/A;    Social History   Socioeconomic History   Marital status: Married    Spouse name: Not on file   Number of children: 4   Years of education: Not on file   Highest education level: Not on file  Occupational History   Occupation: Holiday representative    Comment: [employer]  Tobacco Use   Smoking status: Former    Packs/day: 1.50    Years: 15.00    Pack years: 22.50    Types: Cigarettes    Quit date: 04/20/1997    Years since quitting: 24.1   Smokeless tobacco: Never  Vaping Use   Vaping Use: Never used  Substance and Sexual Activity   Alcohol use: No   Drug use: No   Sexual activity: Not on file  Other Topics Concern   Not on file  Social History Narrative   Not on file   Social Determinants of Health   Financial Resource Strain: Not on file  Food Insecurity: Not on file  Transportation Needs: Not on file  Physical Activity: Not on file  Stress: Not on file  Social Connections: Not on file  Intimate Partner Violence: Not on file    Family History  Problem  Relation Age of Onset   CAD Father    Heart attack Brother     Anti-infectives: Anti-infectives (From admission, onward)    None       Current Outpatient Medications  Medication Sig Dispense Refill   aspirin EC 81 MG EC tablet Take 1 tablet (81 mg total) by mouth daily. 30 tablet 0   atorvastatin (LIPITOR) 80 MG tablet Take 1 tablet (80 mg total) by mouth daily at 6 PM. 90 tablet 3   clopidogrel (PLAVIX) 75 MG tablet Take 1 tablet (75 mg total) by mouth daily. 90 tablet 3   glimepiride (AMARYL) 2 MG tablet Take 2 mg by mouth daily.     HYDROcodone-acetaminophen (NORCO) 10-325 MG tablet Take 1 tablet by mouth every 6 (six) hours as needed for moderate pain.   0   lisinopril (ZESTRIL) 10 MG tablet Take 1 tablet (10 mg total) by mouth daily. 30 tablet 6   meclizine (ANTIVERT) 12.5 MG tablet Take 1 tablet (12.5 mg total) by mouth 3 (three) times daily as needed for dizziness. 30 tablet 0   nitroGLYCERIN (NITROSTAT) 0.4 MG SL tablet Place 1 tablet (0.4 mg total) under the tongue every 5 (five) minutes as needed for chest pain. For 3 doses only 30 tablet 0   pantoprazole (PROTONIX) 20 MG tablet Take 1 tablet (20 mg total) by mouth daily. Additional refills from PCP 90 tablet 0   amLODipine (NORVASC) 5 MG tablet Take 0.5 tablets (2.5 mg total) by mouth daily. 45 tablet 3   No current facility-administered medications for this visit.     Objective: Vital signs in last 24 hours: BP (!) 162/73 (BP Location: Left Arm, Patient Position: Sitting, Cuff Size: Normal)   Pulse (!) 54   Temp 98.7 F (37.1 C)   Ht 5\' 10"  (1.778 m)   Wt 194 lb 9.6 oz (88.3 kg)   BMI 27.92 kg/m   I Physical Exam Vitals reviewed.  Constitutional:      Appearance: Normal appearance.  Abdominal:     General: Abdomen is flat.     Palpations: Abdomen is soft.     Hernia: No hernia is present.  Genitourinary:    Comments: Normal uncirc phallus with adequate meatus. Scrotum, testes and epididymis normal  without mass or tenderness. Mild right groin tenderness without hernia.  AP without lesions. NST without mass. Prostate 1+ firm without nodules.  Right SV slightly firm with irregularity.  Musculoskeletal:     Comments: Some right hip reduced motiliity with pain reproduced with flexion.  Neurological:     Mental Status: He  is alert.    Lab Results:  Results for orders placed or performed in visit on 06/23/21 (from the past 24 hour(s))  Urinalysis, Routine w reflex microscopic     Status: None   Collection Time: 06/23/21  3:17 PM  Result Value Ref Range   Specific Gravity, UA 1.025 1.005 - 1.030   pH, UA 5.5 5.0 - 7.5   Color, UA Yellow Yellow   Appearance Ur Clear Clear   Leukocytes,UA Negative Negative   Protein,UA Negative Negative/Trace   Glucose, UA Negative Negative   Ketones, UA Negative Negative   RBC, UA Negative Negative   Bilirubin, UA Negative Negative   Urobilinogen, Ur 1.0 0.2 - 1.0 mg/dL   Nitrite, UA Negative Negative   Microscopic Examination Comment    Narrative   Performed at:  [address] [facility] [address] [place], [place]  [id number] Lab Director: Chinita Pester MT, Phone:  [phone]     BMET No results for input(s): NA, K, CL, CO2, GLUCOSE, BUN, CREATININE, CALCIUM in the last 72 hours. PT/INR No results for input(s): LABPROT, INR in the last 72 hours. ABG No results for input(s): PHART, HCO3 in the last 72 hours.  Invalid input(s): PCO2, PO2  Studies/Results: I have reviewed his imaging and no films that show the hips are found.  US SCROTUM W/DOPPLER  Result Date: 05/24/2021 CLINICAL DATA:  Right-sided testicular pain for 1 month EXAM: SCROTAL ULTRASOUND DOPPLER ULTRASOUND OF THE TESTICLES TECHNIQUE: Complete ultrasound examination of the testicles, epididymis, and other scrotal structures was performed. Color and spectral Doppler ultrasound were also utilized to evaluate blood flow to the testicles. COMPARISON:  None. FINDINGS:  Right testicle Measurements: 2.4 x 1.5 x 2.3 cm. No mass or microlithiasis visualized. Left testicle Measurements: 3.4 x 1.5 x 2.3 cm. No mass or microlithiasis visualized. Right epididymis:  5 mm right epididymal cyst is noted. Left epididymis:  Normal in size and appearance. Hydrocele:  Mild hydroceles are noted bilaterally. Varicocele:  None visualized. Pulsed Doppler interrogation of both testes demonstrates normal low resistance arterial and venous waveforms bilaterally. Mildly prominent lymph node is noted in the region of the right inguinal discomfort although a normal fatty hilum is noted. This may be reactive in nature. IMPRESSION: Normal-appearing testicles bilaterally. Small right epididymal cyst. Electronically Signed   By: Alcide Clever M.D.   On: 05/24/2021 09:30    Assessment/Plan: The prostate is small and slightly irregular probably because of the small size.  This is stable.     He had some right groin pain that has progressed and is worse with walking.  I am concerned about hip arthritis and will get a hip films series.  If that is not revealing we could consider PT or possibly a CT.  There is a small node on the right on the [place] but it is felt to be most consistent with a reactive process. .   No orders of the defined types were placed in this encounter.    Orders Placed This Encounter  Procedures   DG HIP UNILAT WITH PELVIS 2-3 VIEWS RIGHT    Standing Status:   Future    Standing Expiration Date:   06/23/2022    Order Specific Question:   Reason for Exam (SYMPTOM  OR DIAGNOSIS REQUIRED)    Answer:   right groin pain with hip movement    Order Specific Question:   Preferred imaging location?    Answer:   [facility]   Urinalysis, Routine  w reflex microscopic     Return for I will call him with the results of the imaging and arrange f/u accordingly. .    CC: Dr. [name] and Jayme Cloud NP.      Bjorn Pippin 06/23/2021 [phone] ID: Raelyn Number, male   DOB: 1941-01-21, 80 y.o.   MRN: [id number]

## 2021-06-23 NOTE — Progress Notes (Signed)

## 2021-06-28 ENCOUNTER — Encounter (HOSPITAL_COMMUNITY): Payer: Self-pay

## 2021-06-28 ENCOUNTER — Other Ambulatory Visit: Payer: Self-pay

## 2021-06-28 ENCOUNTER — Emergency Department (HOSPITAL_COMMUNITY): Payer: Medicare HMO

## 2021-06-28 ENCOUNTER — Emergency Department (HOSPITAL_COMMUNITY)
Admission: EM | Admit: 2021-06-28 | Discharge: 2021-06-28 | Disposition: A | Discharge status: Home / Self Care | Payer: Medicare HMO | Attending: Emergency Medicine | Admitting: Emergency Medicine

## 2021-06-28 DIAGNOSIS — Z7982 Long term (current) use of aspirin: Secondary | ICD-10-CM | POA: Insufficient documentation

## 2021-06-28 DIAGNOSIS — R410 Disorientation, unspecified: Secondary | ICD-10-CM | POA: Insufficient documentation

## 2021-06-28 DIAGNOSIS — K5900 Constipation, unspecified: Secondary | ICD-10-CM | POA: Diagnosis not present

## 2021-06-28 DIAGNOSIS — I700 Atherosclerosis of aorta: Secondary | ICD-10-CM | POA: Insufficient documentation

## 2021-06-28 DIAGNOSIS — Z87891 Personal history of nicotine dependence: Secondary | ICD-10-CM | POA: Insufficient documentation

## 2021-06-28 DIAGNOSIS — E119 Type 2 diabetes mellitus without complications: Secondary | ICD-10-CM | POA: Insufficient documentation

## 2021-06-28 DIAGNOSIS — Z7984 Long term (current) use of oral hypoglycemic drugs: Secondary | ICD-10-CM | POA: Diagnosis not present

## 2021-06-28 DIAGNOSIS — I2510 Atherosclerotic heart disease of native coronary artery without angina pectoris: Secondary | ICD-10-CM | POA: Insufficient documentation

## 2021-06-28 DIAGNOSIS — I10 Essential (primary) hypertension: Secondary | ICD-10-CM | POA: Insufficient documentation

## 2021-06-28 DIAGNOSIS — Z79899 Other long term (current) drug therapy: Secondary | ICD-10-CM | POA: Insufficient documentation

## 2021-06-28 DIAGNOSIS — Z7902 Long term (current) use of antithrombotics/antiplatelets: Secondary | ICD-10-CM | POA: Insufficient documentation

## 2021-06-28 DIAGNOSIS — Z8249 Family history of ischemic heart disease and other diseases of the circulatory system: Secondary | ICD-10-CM | POA: Insufficient documentation

## 2021-06-28 LAB — CBC WITH DIFFERENTIAL/PLATELET
Abs Immature Granulocytes: 0.01 10*3/uL (ref 0.00–0.07)
Basophils Absolute: 0.1 10*3/uL (ref 0.0–0.1)
Basophils Relative: 1 %
Eosinophils Absolute: 0.3 10*3/uL (ref 0.0–0.5)
Eosinophils Relative: 5 %
HCT: 45.1 % (ref 39.0–52.0)
Hemoglobin: 14.8 g/dL (ref 13.0–17.0)
Immature Granulocytes: 0 %
Lymphocytes Relative: 30 %
Lymphs Abs: 1.6 10*3/uL (ref 0.7–4.0)
MCH: 31.3 pg (ref 26.0–34.0)
MCHC: 32.8 g/dL (ref 30.0–36.0)
MCV: 95.3 fL (ref 80.0–100.0)
Monocytes Absolute: 0.5 10*3/uL (ref 0.1–1.0)
Monocytes Relative: 10 %
Neutro Abs: 2.9 10*3/uL (ref 1.7–7.7)
Neutrophils Relative %: 54 %
Platelets: 180 10*3/uL (ref 150–400)
RBC: 4.73 MIL/uL (ref 4.22–5.81)
RDW: 13.4 % (ref 11.5–15.5)
WBC: 5.4 10*3/uL (ref 4.0–10.5)
nRBC: 0 % (ref 0.0–0.2)

## 2021-06-28 LAB — COMPREHENSIVE METABOLIC PANEL
ALT: 32 U/L (ref 0–44)
AST: 27 U/L (ref 15–41)
Albumin: 4 g/dL (ref 3.5–5.0)
Alkaline Phosphatase: 82 U/L (ref 38–126)
Anion gap: 7 (ref 5–15)
BUN: 14 mg/dL (ref 8–23)
CO2: 27 mmol/L (ref 22–32)
Calcium: 9 mg/dL (ref 8.9–10.3)
Chloride: 101 mmol/L (ref 98–111)
Creatinine, Ser: 0.76 mg/dL (ref 0.61–1.24)
GFR, Estimated: >60 mL/min (ref >60)
Glucose, Bld: 129 mg/dL — ABNORMAL HIGH (ref 70–99)
Potassium: 4.1 mmol/L (ref 3.5–5.1)
Sodium: 135 mmol/L (ref 135–145)
Total Bilirubin: 1.5 mg/dL — ABNORMAL HIGH (ref 0.3–1.2)
Total Protein: 7.1 g/dL (ref 6.5–8.1)

## 2021-06-28 LAB — CBG MONITORING, ED: Glucose-Capillary: 88 mg/dL (ref 70–99)

## 2021-06-28 LAB — LIPASE, BLOOD: Lipase: 25 U/L (ref 11–51)

## 2021-06-28 MED ORDER — POLYETHYLENE GLYCOL 3350 17 G PO PACK
17.0000 g | PACK | Freq: Once | ORAL | Status: AC
  Administered 2021-06-28: 17 g via ORAL
  Filled 2021-06-28: qty 1

## 2021-06-28 MED ORDER — IOHEXOL 300 MG/ML  SOLN
100.0000 mL | Freq: Once | INTRAMUSCULAR | Status: AC | PRN
  Administered 2021-06-28: 100 mL via INTRAVENOUS

## 2021-06-28 NOTE — ED Provider Notes (Signed)
[facility] EMERGENCY DEPARTMENT Provider Note   CSN: [id number] Arrival date & time: 06/28/21  5009     History Chief Complaint  Patient presents with   Constipation    Shawn Lopez is a 80 y.o. male.  HPI He presents for evaluation of no bowel movements and a feeling of fullness in his abdomen for 6 days.  Last bowel movement was 6 days ago.  He did not eat today but was able to eat yesterday.  There is been no vomiting.  He denies fever or chills.  He has mild abdominal discomfort and sometimes feels a knot in his right groin.  No chronic constipation symptoms.  No changes in urinary habits.  There are no other known modifying factors.    Past Medical History:  Diagnosis Date   Arthritis    Atrial fibrillation (HCC) 2006   a. in 2006; not on anticoagulation, no recent atrial fib seen as of 2018.   Borderline hypertension    CAD (coronary artery disease)    a. NSTEMI 12/2016: cath showing 95% mid-LAD stenosis, 75% prox-LAD stenosis and 75% 1st Diag stenosis. DES to the proximal and mid-LAD along with the 1st Diagonal..   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   Atrial fibrillation (HCC)    Essential hypertension    Hyperlipidemia LDL goal <70    Diabetes mellitus (HCC)    Osteopenia    Chronic back pain    Chest pain     Past Surgical History:  Procedure Laterality Date   APPENDECTOMY  1960   CATARACT EXTRACTION  2012   right  eye,lens implantation   CHOLECYSTECTOMY     CIRCUMCISION  2010   meatotomy and dilatation   COLONOSCOPY N/A 01/14/2013   Procedure: COLONOSCOPY;  Surgeon: Dalia Heading, MD;  Location: [facility];  Service: Gastroenterology;  Laterality: N/A;   CORONARY ANGIOGRAPHY N/A 01/15/2017   Procedure: Coronary Angiography;  Surgeon: Runell Gess, MD;  Location: [facility] INVASIVE CV LAB;  Service: Cardiovascular;  Laterality: N/A;   CORONARY STENT INTERVENTION N/A 01/15/2017   Procedure: Coronary Stent Intervention;  Surgeon: Runell Gess, MD;  Location: [facility] INVASIVE CV LAB;  Service: Cardiovascular;  Laterality: N/A;   INTRAVASCULAR PRESSURE WIRE/FFR STUDY N/A 04/04/2019   Procedure: INTRAVASCULAR PRESSURE WIRE/FFR STUDY;  Surgeon: Yvonne Kendall, MD;  Location: [facility] INVASIVE CV LAB;  Service: Cardiovascular;  Laterality: N/A;   LEFT HEART CATH AND CORONARY ANGIOGRAPHY N/A 04/04/2019   Procedure: LEFT HEART CATH AND CORONARY ANGIOGRAPHY;  Surgeon: Yvonne Kendall, MD;  Location: [facility] INVASIVE CV LAB;  Service: Cardiovascular;  Laterality: N/A;       Family History  Problem Relation Age of Onset   CAD Father    Heart attack Brother     Social History   Tobacco Use   Smoking status: Former    Packs/day: 1.50    Years: 15.00  Pack years: 22.50    Types: Cigarettes    Quit date: 04/20/1997    Years since quitting: 24.2   Smokeless tobacco: Never  Vaping Use   Vaping Use: Never used  Substance Use Topics   Alcohol use: No   Drug use: No    Home Medications Prior to Admission medications   Medication Sig Start Date End Date Taking? Authorizing Provider  amLODipine (NORVASC) 5 MG tablet Take 0.5 tablets (2.5 mg total) by mouth daily. 02/07/21 05/08/21  Dyann Kief, PA-C  aspirin EC 81 MG EC tablet Take 1 tablet (81 mg total) by mouth daily. 01/17/17   Ghimire, Werner Lean, MD  atorvastatin (LIPITOR) 80 MG tablet Take 1 tablet (80 mg total) by mouth daily at 6 PM. 03/09/17   Jodelle Gross, NP  clopidogrel (PLAVIX) 75 MG tablet Take 1 tablet (75 mg total) by mouth daily. 03/09/17   Jodelle Gross, NP  glimepiride (AMARYL) 2 MG tablet Take 2 mg by mouth daily. 05/18/20   [provider]  HYDROcodone-acetaminophen (NORCO) 10-325 MG tablet Take 1 tablet by mouth every 6 (six) hours as needed for moderate pain.  08/02/17   [provider]  lisinopril (ZESTRIL) 10 MG tablet Take 1 tablet (10 mg total) by mouth daily. 04/16/20   Netta Neat., NP  meclizine (ANTIVERT) 12.5 MG tablet Take 1 tablet (12.5 mg total) by mouth 3 (three) times daily as needed for dizziness. 05/03/20   Long, Arlyss Repress, MD  nitroGLYCERIN (NITROSTAT) 0.4 MG SL tablet Place 1 tablet (0.4 mg total) under the tongue every 5 (five) minutes as needed for chest pain. For 3 doses only 01/16/17   Ghimire, Werner Lean, MD  pantoprazole (PROTONIX) 20 MG tablet Take 1 tablet (20 mg total) by mouth daily. Additional refills from PCP 02/09/21   Dyann Kief, PA-C    Allergies    Brilinta [ticagrelor]  Review of Systems   Review of Systems  All other systems reviewed and are negative.  Physical Exam Updated Vital Signs BP 133/65 (BP Location: Right Arm)   Pulse (!) 48   Temp 97.7 F (36.5 C) (Oral)   Resp 16   Ht 5\' 10"  (1.778 m)   Wt 87.1 kg   SpO2 97%   BMI 27.55 kg/m   Physical Exam Vitals and nursing note reviewed.  Constitutional:      General: He is not in acute distress.    Appearance: He is well-developed. He is not ill-appearing, toxic-appearing or diaphoretic.  HENT:     Head: Normocephalic and atraumatic.     Right Ear: External ear normal.     Left Ear: External ear normal.  Eyes:     Conjunctiva/sclera: Conjunctivae normal.     Pupils: Pupils are equal, round, and reactive to light.  Neck:     Trachea: Phonation normal.  Cardiovascular:     Rate and Rhythm: Normal rate.  Pulmonary:     Effort: Pulmonary effort is normal. No respiratory distress.     Breath  sounds: No stridor.  Abdominal:     General: There is no distension.     Palpations: Abdomen is soft.     Tenderness: There is no abdominal tenderness.     Comments: No mass in either groin.  No appreciable hernia.  Genitourinary:    Penis: Normal.      Testes: Normal.     Comments: Normal scrotal contents.  Normal penis.  Normal anus.  Small  amount of brown stool in rectal vault.  No fecal impaction. Musculoskeletal:        General: Normal range of motion.     Cervical back: Normal range of motion and neck supple.  Skin:    General: Skin is warm and dry.  Neurological:     Mental Status: He is alert and oriented to person, place, and time.     Cranial Nerves: No cranial nerve deficit.     Sensory: No sensory deficit.     Motor: No abnormal muscle tone.     Coordination: Coordination normal.  Psychiatric:        Behavior: Behavior normal.        Thought Content: Thought content normal.        Judgment: Judgment normal.    ED Results / Procedures / Treatments   Labs (all labs ordered are listed, but only abnormal results are displayed) Labs Reviewed  COMPREHENSIVE METABOLIC PANEL - Abnormal; Notable for the following components:      Result Value   Glucose, Bld 129 (*)    Total Bilirubin 1.5 (*)    All other components within normal limits  CBC WITH DIFFERENTIAL/PLATELET  LIPASE, BLOOD  URINALYSIS, ROUTINE W REFLEX MICROSCOPIC  CBG MONITORING, ED    EKG None  Radiology CT Abdomen Pelvis W Contrast  Result Date: 06/28/2021 CLINICAL DATA:  Right groin pain and constipation. EXAM: CT ABDOMEN AND PELVIS WITH CONTRAST TECHNIQUE: Multidetector CT imaging of the abdomen and pelvis was performed using the standard protocol following bolus administration of intravenous contrast. CONTRAST:  OMNIPAQUE IOHEXOL 300 MG/ML  SOLN COMPARISON:  CT chest, abdomen, and pelvis report dated July 11, 2014. FINDINGS: Lower chest: No acute abnormality. Hepatobiliary: No focal liver  abnormality is seen. Status post cholecystectomy. No biliary dilatation. Pancreas: Unremarkable. No pancreatic ductal dilatation or surrounding inflammatory changes. Spleen: Normal in size without focal abnormality. Adrenals/Urinary Tract: Adrenal glands are unremarkable. Kidneys are normal, without renal calculi, focal lesion, or hydronephrosis. Bladder is unremarkable. Stomach/Bowel: Stomach is within normal limits. History of prior appendectomy. No evidence of bowel wall thickening, distention, or inflammatory changes. Mild sigmoid diverticulosis. Vascular/Lymphatic: Aortic atherosclerosis. No enlarged abdominal or pelvic lymph nodes. Reproductive: Prostate is unremarkable. Other: No abdominal wall hernia or abnormality. No abdominopelvic ascites. No pneumoperitoneum. Musculoskeletal: No acute or significant osseous findings. Chronic T12 and L1 compression deformities. Small right iliopsoas intramuscular lipomas. IMPRESSION: 1. No acute intra-abdominal process. 2. Aortic Atherosclerosis (ICD10-I70.0). Electronically Signed   By: Obie Dredge M.D.   On: 06/28/2021 14:36    Procedures Procedures   Medications Ordered in ED Medications  iohexol (OMNIPAQUE) 300 MG/ML solution 100 mL (100 mLs Intravenous Contrast Given 06/28/21 1401)  polyethylene glycol (MIRALAX / GLYCOLAX) packet 17 g (17 g Oral Given 06/28/21 1549)    ED Course  I have reviewed the triage vital signs and the nursing notes.  Pertinent labs & imaging results that were available during my care of the patient were reviewed by me and considered in my medical decision making (see chart for details).    MDM Rules/Calculators/A&P                            Patient Vitals for the past 24 hrs:  BP Temp Temp src Pulse Resp SpO2 Height Weight  06/28/21 1304 133/65 -- -- (!) 48 16 97 % -- --  06/28/21 1157 (!) 142/68 -- -- (!) 52 16 99 % -- --  06/28/21 0915 (!) 146/86 97.7 F (36.5 C) Oral (!) 56 16 97 % 5\' 10"  (1.778 m) 87.1 kg     At the time of discharge reevaluation with update and discussion. After initial assessment and treatment, an updated evaluation reveals he is comfortable and has no further complaints.   Medical Decision Making:  This patient is presenting for evaluation of patient concern for constipation, which does require a range of treatment options, and is a complaint that involves a moderate risk of morbidity and mortality. The differential diagnoses include constipation, bowel obstruction, abdominal hernia, acute infection. I decided to review old records, and in summary elderly male, somewhat poor historian, presenting with worry for constipation.  Also has right groin pain, was recently evaluated by urology without specific findings..  I did not require additional historical information from anyone.  Clinical Laboratory Tests Ordered, included CBC, Metabolic panel, and lipase . Review indicates normal except slight elevation of glucose. Radiologic Tests Ordered, included CT abdomen pelvis, CT head.  I independently Visualized: Radiographic images, which show no acute abnormalities    Critical Interventions-clinical evaluation, laboratory testing, rectal examination, MiraLAX for constipation, observation and reassessment  After These Interventions, the Patient was reevaluated and was found stable for discharge.  Suspect bowel dysmotility without frank constipation or obstruction.  Patient is nontoxic in appearance with reassuring ED evaluation.  No indication for hospitalization at this time.  CRITICAL CARE-no Performed by: Mancel Bale  Nursing Notes Reviewed/ Care Coordinated Applicable Imaging Reviewed Interpretation of Laboratory Data incorporated into ED treatment  The patient appears reasonably screened and/or stabilized for discharge and I doubt any other medical condition or other [facility] requiring further screening, evaluation, or treatment in the ED at this time prior to  discharge.  Plan: Home Medications-continue current, use MiraLAX until having regular bowel movements; Home Treatments-increase fiber in diet; return here if the recommended treatment, does not improve the symptoms; Recommended follow up-PCP follow-up as needed     Final Clinical Impression(s) / ED Diagnoses Final diagnoses:  Constipation, unspecified constipation type    Rx / DC Orders ED Discharge Orders     None        [facility], MD 06/29/21 1142

## 2021-06-28 NOTE — ED Provider Notes (Signed)
Emergency Medicine Provider Triage Evaluation Note  Shawn Lopez , a 80 y.o. male  was evaluated in triage.  Pt complains of constipation. No BM in last 5 days. Denies passing gas. Last two bowel movements required use of laxatives last week, has not tried this week. No N/V. Also has right inguinal pain x 1.5 months. Seen by urology, no hematuria or dysuria. S/P cholecystectomy and appendectomy.   Review of Systems  Positive: Constipation Negative: N/V  Physical Exam  BP (!) 146/86 (BP Location: Right Arm)   Pulse (!) 56   Temp 97.7 F (36.5 C) (Oral)   Resp 16   Ht 5\' 10"  (1.778 m)   Wt 87.1 kg   SpO2 97%   BMI 27.55 kg/m  Gen:   Awake, no distress   Resp:  Normal effort  MSK:   Moves extremities without difficulty  Other:  Abdomen soft. RLQ/Inguinal tenderness  Medical Decision Making  Medically screening exam initiated at 9:54 AM.  Appropriate orders placed.  was informed that the remainder of the evaluation will be completed by another provider, this initial triage assessment does not replace that evaluation, and the importance of remaining in the ED until their evaluation is complete.  [facility] labs   Raelyn Number, Theron Arista 06/28/21 08/28/21    [id number], MD 07/02/21 1137

## 2021-06-28 NOTE — ED Notes (Signed)
Pt declined to wait on head CT results.

## 2021-06-28 NOTE — ED Triage Notes (Signed)
Pt presents to ED for constipation. Pt states hasn't had BM x 5 days. Pt c/o left lower abdominal pain, states he hasn't took anything for constipation since last Thursday.

## 2021-06-28 NOTE — Discharge Instructions (Addendum)
Since you have not had a bowel movement since last week, use MiraLAX, 17 g, mixed with orange juice or water, twice a day for 2 weeks after that once a day for 1 month.  This should improve your stooling.  If you begin to have diarrhea stop using the MiraLAX.  See your primary care doctor for a checkup in 1 week, sooner if needed for problems.

## 2021-06-30 ENCOUNTER — Ambulatory Visit: Payer: Medicare HMO | Admitting: Urology

## 2021-07-26 ENCOUNTER — Other Ambulatory Visit: Payer: Self-pay | Admitting: Otolaryngology

## 2021-07-26 ENCOUNTER — Other Ambulatory Visit: Payer: Self-pay | Admitting: Nurse Practitioner

## 2021-07-26 ENCOUNTER — Other Ambulatory Visit (HOSPITAL_COMMUNITY): Payer: Self-pay | Admitting: Nurse Practitioner

## 2021-07-26 DIAGNOSIS — R102 Pelvic and perineal pain: Secondary | ICD-10-CM

## 2021-07-26 DIAGNOSIS — K5909 Other constipation: Secondary | ICD-10-CM

## 2021-07-26 DIAGNOSIS — R1031 Right lower quadrant pain: Secondary | ICD-10-CM

## 2021-08-01 ENCOUNTER — Encounter (HOSPITAL_BASED_OUTPATIENT_CLINIC_OR_DEPARTMENT_OTHER): Payer: Self-pay

## 2021-08-01 ENCOUNTER — Ambulatory Visit (HOSPITAL_BASED_OUTPATIENT_CLINIC_OR_DEPARTMENT_OTHER): Admission: RE | Admit: 2021-08-01 | Payer: Medicare HMO | Source: Ambulatory Visit

## 2021-09-05 ENCOUNTER — Ambulatory Visit
Admission: EM | Admit: 2021-09-05 | Discharge: 2021-09-05 | Disposition: A | Discharge status: Home / Self Care | Payer: Medicare HMO | Attending: Family Medicine | Admitting: Family Medicine

## 2021-09-05 ENCOUNTER — Other Ambulatory Visit: Payer: Self-pay

## 2021-09-05 DIAGNOSIS — J069 Acute upper respiratory infection, unspecified: Secondary | ICD-10-CM

## 2021-09-05 DIAGNOSIS — Z20822 Contact with and (suspected) exposure to covid-19: Secondary | ICD-10-CM

## 2021-09-05 DIAGNOSIS — Z1152 Encounter for screening for COVID-19: Secondary | ICD-10-CM

## 2021-09-05 MED ORDER — MOLNUPIRAVIR EUA 200MG CAPSULE: 4.0000 | ORAL_CAPSULE | Freq: Two times a day (BID) | ORAL | 0 refills | Status: AC

## 2021-09-05 MED ORDER — PROMETHAZINE-DM 6.25-15 MG/5ML PO SYRP: 5.0000 mL | ORAL_SOLUTION | Freq: Four times a day (QID) | ORAL | 0 refills | Status: DC | PRN

## 2021-09-05 NOTE — ED Provider Notes (Signed)
[facility]    CSN: [id number] Arrival date & time: 09/05/21  1446      History   Chief Complaint No chief complaint on file.   HPI Shawn Lopez is a 80 y.o. male.   Presenting today with 1 day history of cough, congestion, chills, fatigue.  Denies chest pain, shortness of breath, abdominal pain, nausea vomiting or diarrhea.  So far taking over-the-counter cold medicine with minimal relief.  Spouse sick with COVID.  No known history of pulmonary disease.  Past Medical History:  Diagnosis Date   Arthritis    Atrial fibrillation ([place]) 2006   a. in 2006; not on anticoagulation, no recent atrial fib seen as of 2018.   Borderline hypertension    CAD (coronary artery disease)    a. NSTEMI 12/2016: cath showing 95% mid-LAD stenosis, 75% prox-LAD stenosis and 75% 1st Diag stenosis. DES to the proximal and mid-LAD along with the 1st Diagonal..   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   Atrial fibrillation ([place])    Essential hypertension    Hyperlipidemia LDL goal <70    Diabetes mellitus ([place])    Osteopenia    Chronic back pain    Chest pain     Past Surgical History:  Procedure Laterality Date   APPENDECTOMY  1960   CATARACT EXTRACTION  2012   right eye,lens implantation   CHOLECYSTECTOMY     CIRCUMCISION  2010   meatotomy and dilatation   COLONOSCOPY N/A 01/14/2013   Procedure: COLONOSCOPY;  Surgeon: Jamesetta So, MD;   [facility];  Service: Gastroenterology;  Laterality: N/A;   CORONARY ANGIOGRAPHY N/A 01/15/2017   Procedure: Coronary Angiography;  Surgeon: Lorretta Harp, MD;  Location: [place] [facility];  Service: Cardiovascular;  Laterality: N/A;   CORONARY STENT INTERVENTION N/A 01/15/2017   Procedure: Coronary Stent Intervention;  Surgeon: Lorretta Harp, MD;  Location: [place] [facility];  Service: Cardiovascular;  Laterality: N/A;   INTRAVASCULAR PRESSURE WIRE/FFR STUDY N/A 04/04/2019   Procedure: INTRAVASCULAR PRESSURE WIRE/FFR STUDY;  Surgeon: Nelva Bush, MD;  Location: [place] CV LAB;  Service: Cardiovascular;  Laterality: N/A;   LEFT HEART CATH AND CORONARY ANGIOGRAPHY N/A 04/04/2019   Procedure: LEFT HEART CATH AND CORONARY ANGIOGRAPHY;  Surgeon: Nelva Bush, MD;  Location: [place] CV LAB;  Service: Cardiovascular;  Laterality: N/A;     Home Medications    Prior to Admission medications   Medication Sig Start Date End Date Taking? Authorizing Provider  molnupiravir EUA (LAGEVRIO) 200 mg CAPS capsule Take 4 capsules (800 mg total) by mouth 2 (two) times daily for 5 days. 09/05/21 09/10/21 Yes Volney American, PA-C  promethazine-dextromethorphan (PROMETHAZINE-DM) 6.25-15 MG/5ML syrup Take 5 mLs by mouth 4 (four) times daily as needed. 09/05/21  Yes Volney American, PA-C  amLODipine (NORVASC) 5 MG tablet Take 0.5 tablets (2.5 mg total) by mouth  daily. 02/07/21 05/08/21  Dyann Kief, PA-C  aspirin EC 81 MG EC tablet Take 1 tablet (81 mg total) by mouth daily. 01/17/17   Ghimire, Werner Lean, MD  atorvastatin (LIPITOR) 80 MG tablet Take 1 tablet (80 mg total) by mouth daily at 6 PM. 03/09/17   Jodelle Gross, NP  clopidogrel (PLAVIX) 75 MG tablet Take 1 tablet (75 mg total) by mouth daily. 03/09/17   Jodelle Gross, NP  glimepiride (AMARYL) 2 MG tablet Take 2 mg by mouth daily. 05/18/20   [provider]  HYDROcodone-acetaminophen (NORCO)  10-325 MG tablet Take 1 tablet by mouth every 6 (six) hours as needed for moderate pain.  08/02/17   [provider]  lisinopril (ZESTRIL) 10 MG tablet Take 1 tablet (10 mg total) by mouth daily. 04/16/20   Netta Neat., NP  meclizine (ANTIVERT) 12.5 MG tablet Take 1 tablet (12.5 mg total) by mouth 3 (three) times daily as needed for dizziness. 05/03/20   Long, Arlyss Repress, MD  nitroGLYCERIN (NITROSTAT) 0.4 MG SL tablet Place 1 tablet (0.4 mg total) under the tongue every 5 (five) minutes as needed for chest pain. For 3 doses only 01/16/17   Ghimire, Werner Lean, MD  pantoprazole (PROTONIX) 20 MG tablet Take 1 tablet (20 mg total) by mouth daily. Additional refills from PCP 02/09/21   Dyann Kief, PA-C    Family History Family History  Problem Relation Age of Onset   CAD Father    Heart attack Brother     Social History Social History   Tobacco Use   Smoking status: Former    Packs/day: 1.50    Years: 15.00    Pack years: 22.50    Types: Cigarettes    Quit date: 04/20/1997    Years since quitting: 24.3   Smokeless tobacco: Never  Vaping Use   Vaping Use: Never used  Substance Use Topics   Alcohol use: No   Drug use: No     Allergies   Brilinta [ticagrelor]   Review of Systems Review of Systems Per HPI  Physical Exam Triage Vital Signs ED Triage Vitals  Enc Vitals Group     BP 09/05/21 1727 132/68     Pulse Rate 09/05/21 1727 63     Resp 09/05/21 1727 20     Temp 09/05/21 1727 98.2 F (36.8 C)     Temp src --      SpO2 09/05/21 1727 93 %     Weight --      Height --      Head Circumference --      Peak Flow --      Pain Score 09/05/21 1722 4     Pain Loc --      Pain Edu? --      Excl. in GC? --    No data found.  Updated Vital Signs BP 132/68   Pulse 63   Temp 98.2 F (36.8 C)   Resp 20   SpO2 93%   Visual Acuity Right Eye Distance:   Left Eye Distance:   Bilateral Distance:    Right Eye Near:   Left Eye Near:    Bilateral  Near:     Physical Exam Vitals and nursing note reviewed.  Constitutional:      Appearance: He is well-developed.  HENT:     Head: Atraumatic.     Right Ear: External ear normal.     Left Ear: External ear normal.  Nose: Rhinorrhea present.     Mouth/Throat:     Pharynx: Posterior oropharyngeal erythema present. No oropharyngeal exudate.  Eyes:     Conjunctiva/sclera: Conjunctivae normal.     Pupils: Pupils are equal, round, and reactive to light.  Cardiovascular:     Rate and Rhythm: Normal rate.  Pulmonary:     Effort: Pulmonary effort is normal. No respiratory distress.     Breath sounds: No wheezing or rales.  Musculoskeletal:        General: Normal range of motion.     Cervical back: Normal range of motion and neck supple.  Lymphadenopathy:     Cervical: No cervical adenopathy.  Skin:    General: Skin is warm and dry.  Neurological:     Mental Status: He is alert and oriented to person, place, and time.     Motor: No weakness.     Gait: Gait normal.  Psychiatric:        Behavior: Behavior normal.   UC Treatments / Results  Labs (all labs ordered are listed, but only abnormal results are displayed) Labs Reviewed  COVID-19, FLU A+B NAA    EKG   Radiology No results found.  Procedures Procedures (including critical care time)  Medications Ordered in UC Medications - No data to display  Initial Impression / Assessment and Plan / UC Course  I have reviewed the triage vital signs and the nursing notes.  Pertinent labs & imaging results that were available during my care of the patient were reviewed by me and considered in my medical decision making (see chart for details).     Vital signs overall reassuring today, given his close exposure at home to COVID and his current symptoms suspect COVID-19.  COVID and flu testing pending.  In the meantime, will treat with molnupiravir, Phenergan DM for cough.  Discussed over-the-counter supportive medications and  home care.  Return for acutely worsening symptoms.  Final Clinical Impressions(s) / UC Diagnoses   Final diagnoses:  Exposure to COVID-19 virus  Viral URI with cough   Discharge Instructions   None    ED Prescriptions     Medication Sig Dispense Auth. Provider   molnupiravir EUA (LAGEVRIO) 200 mg CAPS capsule Take 4 capsules (800 mg total) by mouth 2 (two) times daily for 5 days. 40 capsule Volney American, [place]   promethazine-dextromethorphan (PROMETHAZINE-DM) 6.25-15 MG/5ML syrup Take 5 mLs by mouth 4 (four) times daily as needed. 100 mL Volney American, [place]      PDMP not reviewed this encounter.   Volney American, [place] 09/05/21 [phone]

## 2021-09-05 NOTE — ED Triage Notes (Signed)
Pt presents with cough and congestion with chills since yesterday

## 2021-09-06 LAB — COVID-19, FLU A+B NAA
Influenza A, NAA: NOT DETECTED
Influenza B, NAA: NOT DETECTED
SARS-CoV-2, NAA: DETECTED — AB

## 2021-10-12 NOTE — Progress Notes (Signed)
Cardiology Office Note  Date: 10/13/2021   ID: Shawn Lopez [id number], MRN [id number]  PCP:  Shawn Morgan, MD  Cardiologist:  Shawn Dell, MD Electrophysiologist:  None   Chief Complaint  Patient presents with   Cardiac follow-up    History of Present Illness: Shawn Lopez is an 81 y.o. male last seen in May 2022 by Ms. Shawn Bers PA-C.  He is here for a follow-up visit.  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  He reports having a tightness in his chest for the last several weeks, not purely exertional, actually resolved within the last week without specific intervention.  He has not had to use any nitroglycerin.    I reviewed his Myoview results from May of last year, noted below.  We went over his medications.  He has not had a recent lipid profile.  Past Medical History:  Diagnosis Date   Arthritis    Atrial fibrillation ([facility]) 2006   a. in 2006; not on anticoagulation, no recent atrial fib seen as of 2018.   Borderline hypertension    CAD (coronary artery disease)    a. NSTEMI 12/2016: cath showing 95% mid-LAD stenosis, 75% prox-LAD stenosis and 75% 1st Diag stenosis. DES to the proximal and mid-LAD along with the 1st Diagonal..   Chronic back pain    Prior trauma with vertebral fracture   Hyperlipidemia    Hypertension    Osteopenia    Sinus bradycardia    Type 2 diabetes mellitus ([facility])     Past Surgical History:  Procedure Laterality Date   APPENDECTOMY  1960   CATARACT EXTRACTION  2012   right eye,lens implantation   CHOLECYSTECTOMY     CIRCUMCISION  2010   meatotomy and dilatation   COLONOSCOPY N/A 01/14/2013   Procedure: COLONOSCOPY;  Surgeon: Shawn Heading, MD;  [facility];  Service: Gastroenterology;  Laterality: N/A;   CORONARY ANGIOGRAPHY N/A 01/15/2017   Procedure: Coronary Angiography;  Surgeon: Shawn Gess, MD;  Location: [facility];  Service: Cardiovascular;  Laterality: N/A;   CORONARY STENT INTERVENTION N/A 01/15/2017   Procedure: Coronary Stent Intervention;  Surgeon: Shawn Gess, MD;  Location: [facility];  Service: Cardiovascular;  Laterality: N/A;   INTRAVASCULAR PRESSURE WIRE/FFR STUDY N/A 04/04/2019   Procedure: INTRAVASCULAR PRESSURE WIRE/FFR STUDY;  Surgeon: Shawn Kendall, MD;  Location: [facility];  Service: Cardiovascular;  Laterality: N/A;   LEFT HEART CATH AND CORONARY ANGIOGRAPHY N/A 04/04/2019   Procedure: LEFT HEART CATH AND CORONARY ANGIOGRAPHY;  Surgeon: Shawn Kendall, MD;  Location: [facility];  Service: Cardiovascular;  Laterality: N/A;    Current Outpatient Medications  Medication Sig Dispense Refill   aspirin EC 81 MG EC tablet Take 1 tablet (81 mg total) by mouth daily. 30 tablet 0   atorvastatin (LIPITOR) 80 MG tablet Take 1 tablet (80 mg total) by mouth daily at 6 PM. 90 tablet 3   clopidogrel (PLAVIX) 75 MG tablet Take 1 tablet (75 mg total) by mouth daily. 90 tablet 3   HYDROcodone-acetaminophen (NORCO) 10-325 MG tablet Take 1 tablet by mouth every 6 (six) hours as needed for moderate pain.   0   lisinopril (ZESTRIL) 10 MG tablet Take 1 tablet (10 mg total) by mouth daily. 30 tablet 6   nitroGLYCERIN (NITROSTAT) 0.4 MG SL tablet Place 1 tablet (0.4 mg total) under the  tongue every 5 (five) minutes as needed for chest pain. For 3 doses only 30 tablet 0   No current facility-administered medications for this visit.   Allergies:  Brilinta [ticagrelor]   ROS: No palpitations or syncope.  Physical Exam: VS:  BP 140/76 Comment: has not taken morning medicine   Pulse (!) 56    Ht 5\' 10"  (1.778 m)    Wt 196 lb 6.4 oz (89.1 kg)    SpO2 98%    BMI 28.18 kg/m , BMI Body mass index is 28.18 kg/m.  Wt Readings from Last 3 Encounters:  10/13/21 196 lb 6.4 oz (89.1 kg)  06/28/21 192 lb (87.1 kg)  06/23/21 194 lb 9.6 oz (88.3 kg)    General: Patient appears  comfortable at rest. HEENT: Conjunctiva and lids normal, wearing a mask. Neck: Supple, no elevated JVP or carotid bruits, no thyromegaly. Lungs: Clear to auscultation, nonlabored breathing at rest. Cardiac: Regular rate and rhythm, no S3 or significant systolic murmur, no pericardial rub Extremities: No pitting edema.  ECG:  An ECG dated 01/31/2021 was personally reviewed today and demonstrated:  Sinus rhythm with nonspecific T wave changes.  Recent Labwork: 01/31/2021: B Natriuretic Peptide 9.0 06/28/2021: ALT 32; AST 27; BUN 14; Creatinine, Ser 0.76; Hemoglobin 14.8; Platelets 180; Potassium 4.1; Sodium 135   Other Studies Reviewed Today:  Echocardiogram 07/23/2020:  1. Left ventricular ejection fraction, by estimation, is 55 to 60%. The  left ventricle has normal function. The left ventricle has no regional  wall motion abnormalities. There is mild left ventricular hypertrophy.  Left ventricular diastolic parameters  are indeterminate. Elevated left atrial pressure.   2. Right ventricular systolic function is normal. The right ventricular  size is normal.   3. Left atrial size was moderately dilated.   4. The mitral valve is normal in structure. Trivial mitral valve  regurgitation. No evidence of mitral stenosis.   5. The aortic valve is tricuspid. There is mild calcification of the  aortic valve. There is mild thickening of the aortic valve. Aortic valve  regurgitation is not visualized. No aortic stenosis is present.   6. The inferior vena cava is normal in size with greater than 50%  respiratory variability, suggesting right atrial pressure of 3 mmHg.   Exercise Myoview 02/09/2021: Blood pressure demonstrated a hypertensive response to exercise. There was no ST segment deviation noted during stress. The study is normal. There are no perfusion defects This is a low risk study. The left ventricular ejection fraction is normal (55-65%). Not able to calculate Duke treadmill score,  protocol held at stage 2 due to shortness of breath.  Assessment and Plan:  1.  CAD status post DES to the proximal and mid LAD as well as first diagonal in 2018.  Follow-up Myoview in May of last year was low risk and showed no evidence of ischemia.  He does report some recent potential angina symptoms, but these have resolved over the last week without specific intervention.  I discussed possibility of addition of Imdur if necessary, he will let June know how he is doing.  Otherwise continue aspirin, Plavix, lisinopril, and Lipitor.  He is not on beta-blocker given resting bradycardia.  2.  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  Check fasting lipid profile.  Medication Adjustments/Labs and Tests Ordered: Current medicines are reviewed at length with the patient today.  Concerns regarding medicines are outlined above.   Tests Ordered: Orders Placed This Encounter  Procedures   Lipid panel    Medication  Changes: No orders of the defined types were placed in this encounter.   Disposition:  Follow up  6 months.  Signed, Jonelle SidleSamuel G. Jacqulin Brandenburger, MD, [facility]ACC 10/13/2021 8:57 AM    [facility] [address], [place], [place] [address] Phone: [phone]) [phone]; Fax: [phone]) [phone]

## 2021-10-13 ENCOUNTER — Ambulatory Visit: Payer: Medicare HMO | Admitting: Cardiology

## 2021-10-13 ENCOUNTER — Encounter: Payer: Self-pay | Admitting: Cardiology

## 2021-10-13 VITALS — BP 140/76 | HR 56 | Ht 70.0 in | Wt 196.4 lb

## 2021-10-13 DIAGNOSIS — E785 Hyperlipidemia, unspecified: Secondary | ICD-10-CM

## 2021-10-13 DIAGNOSIS — I25119 Atherosclerotic heart disease of native coronary artery with unspecified angina pectoris: Secondary | ICD-10-CM

## 2021-10-13 NOTE — Patient Instructions (Addendum)
Medication Instructions:  Your physician recommends that you continue on your current medications as directed. Please refer to the Current Medication list given to you today.  Labwork: Your physician recommends that you return for a FASTING lipid profile: as soon as possible. Please do not eat or drink for at least 8 hours when you have this done. You may take your medications that morning with a sip of water. [facility] or [employer]  Testing/Procedures: none  Follow-Up: Your physician recommends that you schedule a follow-up appointment in: 6 months  Any Other Special Instructions Will Be Listed Below (If Applicable).  If you need a refill on your cardiac medications before your next appointment, please call your pharmacy.

## 2021-10-15 LAB — LIPID PANEL
Chol/HDL Ratio: 2.9 ratio (ref 0.0–5.0)
Cholesterol, Total: 115 mg/dL (ref 100–199)
HDL: 40 mg/dL (ref >39)
LDL Chol Calc (NIH): 57 mg/dL (ref 0–99)
Triglycerides: 91 mg/dL (ref 0–149)
VLDL Cholesterol Cal: 18 mg/dL (ref 5–40)

## 2021-10-18 ENCOUNTER — Telehealth: Payer: Self-pay | Admitting: *Deleted

## 2021-10-18 NOTE — Telephone Encounter (Signed)
-----   Message from Jonelle Sidle, MD sent at 10/16/2021  6:38 PM EST ----- Results reviewed. LDL looks good at 57 on Lipitor. Continue same for now.

## 2021-10-18 NOTE — Telephone Encounter (Signed)
Patient informed. Copy sent to PCP °

## 2021-10-28 ENCOUNTER — Ambulatory Visit: Payer: Medicare HMO | Admitting: Cardiology

## 2021-12-15 ENCOUNTER — Emergency Department (HOSPITAL_COMMUNITY)
Admission: EM | Admit: 2021-12-15 | Discharge: 2021-12-15 | Disposition: A | Discharge status: Home / Self Care | Payer: Medicare HMO | Attending: Emergency Medicine | Admitting: Emergency Medicine

## 2021-12-15 ENCOUNTER — Encounter (HOSPITAL_COMMUNITY): Payer: Self-pay | Admitting: *Deleted

## 2021-12-15 ENCOUNTER — Other Ambulatory Visit: Payer: Self-pay

## 2021-12-15 ENCOUNTER — Emergency Department (HOSPITAL_COMMUNITY): Payer: Medicare HMO

## 2021-12-15 DIAGNOSIS — I2510 Atherosclerotic heart disease of native coronary artery without angina pectoris: Secondary | ICD-10-CM | POA: Insufficient documentation

## 2021-12-15 DIAGNOSIS — E1165 Type 2 diabetes mellitus with hyperglycemia: Secondary | ICD-10-CM | POA: Insufficient documentation

## 2021-12-15 DIAGNOSIS — Z7902 Long term (current) use of antithrombotics/antiplatelets: Secondary | ICD-10-CM | POA: Diagnosis not present

## 2021-12-15 DIAGNOSIS — R42 Dizziness and giddiness: Secondary | ICD-10-CM | POA: Diagnosis present

## 2021-12-15 DIAGNOSIS — Z7982 Long term (current) use of aspirin: Secondary | ICD-10-CM | POA: Insufficient documentation

## 2021-12-15 DIAGNOSIS — R0789 Other chest pain: Secondary | ICD-10-CM | POA: Diagnosis not present

## 2021-12-15 DIAGNOSIS — Z7984 Long term (current) use of oral hypoglycemic drugs: Secondary | ICD-10-CM | POA: Insufficient documentation

## 2021-12-15 DIAGNOSIS — I10 Essential (primary) hypertension: Secondary | ICD-10-CM | POA: Insufficient documentation

## 2021-12-15 DIAGNOSIS — R079 Chest pain, unspecified: Secondary | ICD-10-CM

## 2021-12-15 DIAGNOSIS — Z79899 Other long term (current) drug therapy: Secondary | ICD-10-CM | POA: Diagnosis not present

## 2021-12-15 DIAGNOSIS — R739 Hyperglycemia, unspecified: Secondary | ICD-10-CM

## 2021-12-15 LAB — BASIC METABOLIC PANEL
Anion gap: 10 (ref 5–15)
BUN: 18 mg/dL (ref 8–23)
CO2: 23 mmol/L (ref 22–32)
Calcium: 8.7 mg/dL — ABNORMAL LOW (ref 8.9–10.3)
Chloride: 102 mmol/L (ref 98–111)
Creatinine, Ser: 1.06 mg/dL (ref 0.61–1.24)
GFR, Estimated: >60 mL/min (ref >60)
Glucose, Bld: 252 mg/dL — ABNORMAL HIGH (ref 70–99)
Potassium: 3.8 mmol/L (ref 3.5–5.1)
Sodium: 135 mmol/L (ref 135–145)

## 2021-12-15 LAB — URINALYSIS, ROUTINE W REFLEX MICROSCOPIC
Bilirubin Urine: NEGATIVE
Glucose, UA: 50 mg/dL — AB
Hgb urine dipstick: NEGATIVE
Ketones, ur: NEGATIVE mg/dL
Leukocytes,Ua: NEGATIVE
Nitrite: NEGATIVE
Protein, ur: NEGATIVE mg/dL
Specific Gravity, Urine: 1.024 (ref 1.005–1.030)
pH: 6 (ref 5.0–8.0)

## 2021-12-15 LAB — CBC
HCT: 43.1 % (ref 39.0–52.0)
Hemoglobin: 14.1 g/dL (ref 13.0–17.0)
MCH: 29.9 pg (ref 26.0–34.0)
MCHC: 32.7 g/dL (ref 30.0–36.0)
MCV: 91.3 fL (ref 80.0–100.0)
Platelets: 179 10*3/uL (ref 150–400)
RBC: 4.72 MIL/uL (ref 4.22–5.81)
RDW: 13.3 % (ref 11.5–15.5)
WBC: 6.6 10*3/uL (ref 4.0–10.5)
nRBC: 0 % (ref 0.0–0.2)

## 2021-12-15 LAB — TROPONIN I (HIGH SENSITIVITY)
Troponin I (High Sensitivity): 5 ng/L (ref <18)
Troponin I (High Sensitivity): 7 ng/L (ref <18)

## 2021-12-15 MED ORDER — SODIUM CHLORIDE 0.9 % IV BOLUS
1000.0000 mL | Freq: Once | INTRAVENOUS | Status: AC
  Administered 2021-12-15: 1000 mL via INTRAVENOUS

## 2021-12-15 MED ORDER — ASPIRIN 81 MG PO CHEW
324.0000 mg | CHEWABLE_TABLET | Freq: Once | ORAL | Status: AC
  Administered 2021-12-15: 324 mg via ORAL
  Filled 2021-12-15: qty 4

## 2021-12-15 NOTE — Discharge Instructions (Signed)
If you develop recurrent, continued, or worsening chest pain, shortness of breath, fever, vomiting, abdominal or back pain, or any other new/concerning symptoms then return to the ER for evaluation.  

## 2021-12-15 NOTE — ED Triage Notes (Signed)
Dizziness with burning in chest ?

## 2021-12-15 NOTE — ED Notes (Signed)
Complains of burning in his chest and feeling as though he is going to pass out.  All complaints started this am.  Also complains of cramping more frequently.  ?

## 2021-12-15 NOTE — ED Notes (Signed)
Pt gone to Xray at this time.  ?

## 2021-12-15 NOTE — ED Provider Notes (Signed)
?[place] ?Provider Note ? ? ?CSN: [id number] ?Arrival date & time: 12/15/21  1432 ? ?  ? ?History ? ?Chief Complaint  ?Patient presents with  ? Dizziness  ? ? ?Shawn Lopez is a 81 y.o. male. ? ?HPI ?81 year old male presents with chest pain and lightheadedness.  He has been having on and off burning in his chest that then turns into a pain for a couple weeks.  Today he has felt lightheaded a couple times like he is going to pass out.  The most prominent time he had been walking and then stopped to talk to a neighbor and then noticed he was lightheaded for about 5 minutes.  Felt like his heart was racing.  Right now he does not feel that way.  The chest pain has not been present since this morning.  He did feel transiently short of breath during the chest pain.  Thinks he might of had a little bit of leg swelling in both ankles.  This burning feels a little bit like prior cardiac chest pain.  No headaches or passing out.  No focal weakness.  No abdominal pain.  He was a little nauseous when he was lightheaded but none now. ? ?History is significant for CAD, hypertension, hyperlipidemia, type 2 diabetes. ? ?Home Medications ?Prior to Admission medications   ?Medication Sig Start Date End Date Taking? Authorizing Provider  ?aspirin EC 81 MG EC tablet Take 1 tablet (81 mg total) by mouth daily. 01/17/17  Yes Ghimire, Henreitta Leber, MD  ?atorvastatin (LIPITOR) 80 MG tablet Take 1 tablet (80 mg total) by mouth daily at 6 PM. 03/09/17  Yes Lendon Colonel, NP  ?clopidogrel (PLAVIX) 75 MG tablet Take 1 tablet (75 mg total) by mouth daily. 03/09/17  Yes Lendon Colonel, NP  ?glimepiride (AMARYL) 2 MG tablet Take 1 mg by mouth daily. 10/13/21  Yes [provider]  ?HYDROcodone-acetaminophen (NORCO) 10-325 MG tablet Take 1 tablet by mouth every 6 (six) hours as needed for moderate pain.  08/02/17  Yes [provider]  ?lisinopril (ZESTRIL) 10 MG tablet Take 1 tablet (10 mg total)  by mouth daily. 04/16/20  Yes Verta Ellen., NP  ?nitroGLYCERIN (NITROSTAT) 0.4 MG SL tablet Place 1 tablet (0.4 mg total) under the tongue every 5 (five) minutes as needed for chest pain. For 3 doses only 01/16/17  Yes Ghimire, Henreitta Leber, MD  ?   ? ?Allergies    ?Brilinta [ticagrelor]   ? ?Review of Systems   ?Review of Systems  ?Constitutional:  Negative for fever.  ?Respiratory:  Positive for shortness of breath.   ?Cardiovascular:  Positive for chest pain.  ?Gastrointestinal:  Negative for abdominal pain.  ?Neurological:  Positive for light-headedness. Negative for syncope, weakness, numbness and headaches.  ? ?Physical Exam ?Updated Vital Signs ?BP (!) 159/73   Pulse (!) 53   Temp 97.9 ?F (36.6 ?C) (Oral)   Resp 18   Ht 5\' 10"  (1.778 m)   Wt 88.5 kg   SpO2 99%   BMI 27.98 kg/m?  ?Physical Exam ?Vitals and nursing note reviewed.  ?Constitutional:   ?   Appearance: He is well-developed.  ?HENT:  ?   Head: Normocephalic and atraumatic.  ?Eyes:  ?   Extraocular Movements: Extraocular movements intact.  ?Cardiovascular:  ?   Rate and Rhythm: Normal rate and regular rhythm.  ?   Heart sounds: Normal heart sounds.  ?Pulmonary:  ?   Effort: Pulmonary effort is  normal.  ?   Breath sounds: Normal breath sounds.  ?Abdominal:  ?   Palpations: Abdomen is soft.  ?   Tenderness: There is no abdominal tenderness.  ?Musculoskeletal:  ?   Right lower leg: No edema.  ?   Left lower leg: No edema.  ?Skin: ?   General: Skin is warm and dry.  ?Neurological:  ?   Mental Status: He is alert.  ?   Comments: CN 3-12 grossly intact. 5/5 strength in all 4 extremities. Grossly normal sensation. Normal finger to nose. Normal gait  ? ? ?ED Results / Procedures / Treatments   ?Labs ?(all labs ordered are listed, but only abnormal results are displayed) ?Labs Reviewed  ?BASIC METABOLIC PANEL - Abnormal; Notable for the following components:  ?    Result Value  ? Glucose, Bld 252 (*)   ? Calcium 8.7 (*)   ? All other components  within normal limits  ?URINALYSIS, ROUTINE W REFLEX MICROSCOPIC - Abnormal; Notable for the following components:  ? Glucose, UA 50 (*)   ? All other components within normal limits  ?CBC  ?TROPONIN I (HIGH SENSITIVITY)  ?TROPONIN I (HIGH SENSITIVITY)  ? ? ?EKG ?EKG Interpretation ? ?Date/Time:  Thursday December 15 2021 14:42:28 EDT ?Ventricular Rate:  65 ?PR Interval:  132 ?QRS Duration: 104 ?QT Interval:  402 ?QTC Calculation: 418 ?R Axis:   52 ?Text Interpretation: Normal sinus rhythm Nonspecific ST abnormality  no significant change since May 2022 Confirmed by Sherwood Gambler [id number]) on 12/15/2021 3:01:09 PM ? ?Radiology ?DG Chest 2 View ? ?Result Date: 12/15/2021 ?CLINICAL DATA:  Chest pain and dizziness EXAM: CHEST - 2 VIEW COMPARISON:  01/31/2021 FINDINGS: Heart size upper limits of normal. Chronic aortic tortuosity. Chronic lung markings most consistent with scarring. No sign of active consolidation, collapse or effusion. No edema. Chronic degenerative changes affect the spine with some old compression deformities in the lower thoracic and upper lumbar region. IMPRESSION: No active disease.  Chronic pulmonary scarring. Electronically Signed   By: Nelson Chimes M.D.   On: 12/15/2021 15:46   ? ?Procedures ?Procedures  ? ? ?Medications Ordered in ED ?Medications  ?sodium chloride 0.9 % bolus 1,000 mL (0 mLs Intravenous Stopped 12/15/21 1650)  ?aspirin chewable tablet 324 mg (324 mg Oral Given 12/15/21 1544)  ? ? ?ED Course/ Medical Decision Making/ A&P ?  ?                        ?Medical Decision Making ?Amount and/or Complexity of Data Reviewed ?Labs: ordered. ?Radiology: ordered. ? ?Risk ?OTC drugs. ? ? ?Patient is asymptomatic and has been asymptomatic since arrival.  He does have a prior history of CAD but his presentation of chest pain for a couple weeks is pretty atypical.  His lightheadedness that was transient could have been related to his hyperglycemia which is a little higher than he typically has at 250.   However his urine has no ketones and there is no acidosis on metabolic panel.  Troponins are negative x2.  ECG without acute ischemia.  Doubt ACS, PE, dissection.  Given he has no symptoms now I think he stable for discharge home and outpatient management.  Given return precautions. ? ? ? ? ? ? ? ?Final Clinical Impression(s) / ED Diagnoses ?Final diagnoses:  ?Nonspecific chest pain  ?Hyperglycemia  ? ? ?Rx / DC Orders ?ED Discharge Orders   ? ? None  ? ?  ? ? ?  ?  Sherwood Gambler, MD ?12/15/21 2319 ? ?

## 2021-12-19 ENCOUNTER — Other Ambulatory Visit (HOSPITAL_COMMUNITY): Payer: Self-pay | Admitting: Nurse Practitioner

## 2021-12-19 DIAGNOSIS — R1031 Right lower quadrant pain: Secondary | ICD-10-CM

## 2021-12-23 ENCOUNTER — Ambulatory Visit (HOSPITAL_COMMUNITY)
Admission: RE | Admit: 2021-12-23 | Discharge: 2021-12-23 | Disposition: A | Discharge status: Home / Self Care | Payer: Medicare HMO | Source: Ambulatory Visit | Attending: Nurse Practitioner | Admitting: Nurse Practitioner

## 2021-12-23 DIAGNOSIS — R1031 Right lower quadrant pain: Secondary | ICD-10-CM | POA: Insufficient documentation

## 2022-04-12 NOTE — Progress Notes (Signed)
Cardiology Office Note    Date:  04/13/2022   ID:  [name], [name] 23-Jun-1941, MRN [id number]  PCP:  [name], MD  Cardiologist: [name], MD    Chief Complaint  Patient presents with   Follow-up    6 month visit    History of Present Illness:    Shawn Lopez is a 81 y.o. male with past medical history of CAD (s/p stenting to LAD and OM1 in 12/2016 patent stents by cath in 03/2019, low-risk NST in 01/2021), palpitations (PAC's and PVC's with brief SVT by prior monitor), HTN, HLD and Type II DM who presents to the office today for 28-month follow-up.  He was last examined by Dr. [name] in 09/2021 and was still working full-time in [employer]. He did report occasional episodes of chest tightness but this was not necessarily associated with exertion. The possibility of adding Imdur was reviewed and he wished to call back if wanting to start this. He was continued on ASA, Plavix, Lisinopril and Lipitor and not been on beta-blocker therapy given baseline bradycardia.  He did present to [facility] ED in 11/2021 for evaluation of chest pain and dizziness. Labs were reassuring and troponin values were negative with EKG showing no acute ST changes. Symptoms were overall felt to be atypical for a cardiac etiology and he was discharged home.  In talking with the patient today, he reports remaining physically active at baseline as he continues to work as a Merchandiser, retail for a [employer] and averages 50 hours/week. 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 He denies any exertional chest pain. Does have some burning along his epigastric region in the evening hours upon lying back in his recliner and says this improves with water consumption. Does consume acidic foods routinely, including lots of tomatoes. Was previously on PPI therapy but did not want to take this routinely. Does have baseline dyspnea on exertion but no acute  change in his symptoms. No recent orthopnea, PND or pitting edema. He does report occasional palpitations which are typically most notable at night.   Past Medical History:  Diagnosis Date   Arthritis    Atrial fibrillation (HCC) 2006   a. in 2006; not on anticoagulation, no recent atrial fib seen as of 2018.   Borderline hypertension    CAD (coronary artery disease)    a. NSTEMI 12/2016: cath showing 95% mid-LAD stenosis, 75% prox-LAD stenosis and 75% 1st Diag stenosis. DES to the proximal and mid-LAD along with the 1st Diagonal..   Chronic back pain    Prior trauma with vertebral fracture   Hyperlipidemia    Hypertension    Osteopenia    Sinus bradycardia    Type 2 diabetes mellitus (HCC)     Past Surgical History:  Procedure Laterality Date   APPENDECTOMY  1960   CATARACT EXTRACTION  2012   right eye,lens implantation   CHOLECYSTECTOMY     CIRCUMCISION  2010   meatotomy and dilatation   COLONOSCOPY N/A 01/14/2013   Procedure: COLONOSCOPY;  Surgeon: Dalia Heading, MD;  Location: [facility] ENDO [facility]ITE;  Service: Gastroenterology;  Laterality: N/A;   CORONARY ANGIOGRAPHY N/A 01/15/2017   Procedure: Coronary Angiography;  Surgeon: Runell Gess, MD;  Location: [facility] INVASIVE CV LAB;  Service: Cardiovascular;  Laterality: N/A;   CORONARY STENT INTERVENTION N/A 01/15/2017   Procedure: Coronary Stent Intervention;  Surgeon: Runell Gess, MD;  Location: [facility] INVASIVE CV LAB;  Service: Cardiovascular;  Laterality: N/A;   INTRAVASCULAR PRESSURE WIRE/FFR STUDY N/A 04/04/2019   Procedure: INTRAVASCULAR PRESSURE WIRE/FFR STUDY;  Surgeon: Yvonne Kendall, MD;  Location: [facility] INVASIVE CV LAB;  Service: Cardiovascular;  Laterality: N/A;   LEFT HEART CATH AND CORONARY ANGIOGRAPHY N/A 04/04/2019   Procedure: LEFT HEART CATH AND CORONARY ANGIOGRAPHY;  Surgeon: Yvonne Kendall, MD;  Location: [facility] INVASIVE CV LAB;  Service: Cardiovascular;  Laterality: N/A;    Current Medications: Outpatient Medications  Prior to Visit  Medication Sig Dispense Refill   aspirin EC 81 MG EC tablet Take 1 tablet (81 mg total) by mouth daily. 30 tablet 0   atorvastatin (LIPITOR) 80 MG tablet Take 1 tablet (80 mg total) by mouth daily at 6 PM. 90 tablet 3   clopidogrel (PLAVIX) 75 MG tablet Take 1 tablet (75 mg total) by mouth daily. 90 tablet 3   glimepiride (AMARYL) 2 MG tablet Take 1 mg by mouth daily.     HYDROcodone-acetaminophen (NORCO) 10-325 MG tablet Take 1 tablet by mouth every 6 (six) hours as needed for moderate pain.   0   lisinopril (ZESTRIL) 10 MG tablet Take 1 tablet (10 mg total) by mouth daily. 30 tablet 6   nitroGLYCERIN (NITROSTAT) 0.4 MG SL tablet Place 1 tablet (0.4 mg total) under the tongue every 5 (five) minutes as needed for chest pain. For 3 doses only 30 tablet 0   No facility-administered medications prior to visit.     Allergies:   Brilinta [ticagrelor]   Social History   Socioeconomic History   Marital status: Married    Spouse name: Not on file   Number of children: 4   Years of education: Not on file   Highest education level: Not on file  Occupational History   Occupation: Holiday representative    Comment: Ecologist  Tobacco Use   Smoking status: Former    Packs/day: 1.50    Years: 15.00    Total pack years: 22.50    Types: Cigarettes    Quit date: 04/20/1997    Years since quitting: 24.9   Smokeless tobacco: Never  Vaping Use   Vaping Use: Never used  Substance and Sexual Activity   Alcohol use: No   Drug use: No   Sexual activity: Not on file  Other Topics Concern   Not on file  Social History Narrative   Not on file   Social Determinants of Health   Financial Resource Strain: Not on file  Food Insecurity: Not on file  Transportation Needs: Not on file  Physical Activity: Not on file  Stress: Not on file  Social Connections: Not on file     Family History:  The patient's family history includes CAD in his father; Heart attack in his  brother.   Review of Systems:    Please see the history of present illness.     All other systems reviewed and are otherwise negative except as noted above.   Physical Exam:    VS:  BP 138/78   Pulse (!) 56   Ht 5\' 10"  (1.778 m)   Wt 195 lb (88.5 kg)   SpO2 95%   BMI 27.98 kg/m    General: Well developed, well nourished,male appearing in no acute distress. Head: Normocephalic, atraumatic. Neck: No carotid bruits. JVD not elevated.  Lungs: Respirations regular and unlabored, without wheezes or rales.  Heart: Regular rate and rhythm. No S3 or S4.  No murmur, no rubs, or gallops appreciated. Abdomen: Appears  non-distended. No obvious abdominal masses. Msk:  Strength and tone appear normal for age. No obvious joint deformities or effusions. Extremities: No clubbing or cyanosis. No pitting edema.  Distal pedal pulses are 2+ bilaterally. Neuro: Alert and oriented X 3. Moves all extremities spontaneously. No focal deficits noted. Psych:  Responds to questions appropriately with a normal affect. Skin: No rashes or lesions noted  Wt Readings from Last 3 Encounters:  04/13/22 195 lb (88.5 kg)  12/15/21 195 lb (88.5 kg)  10/13/21 196 lb 6.4 oz (89.1 kg)     Studies/Labs Reviewed:   EKG:  EKG is not ordered today. EKG from 12/15/2021 is reviewed and shows NSR,HR 65 with no acute ST changes when compared to prior tracings.   Recent Labs: 06/28/2021: ALT 32 12/15/2021: BUN 18; Creatinine, Ser 1.06; Hemoglobin 14.1; Platelets 179; Potassium 3.8; Sodium 135   Lipid Panel    Component Value Date/Time   CHOL 115 10/14/2021 0914   TRIG 91 10/14/2021 0914   HDL 40 10/14/2021 0914   CHOLHDL 2.9 10/14/2021 0914   CHOLHDL 3.4 01/15/2017 0702   VLDL 32 01/15/2017 0702   LDLCALC 57 10/14/2021 0914    Additional studies/ records that were reviewed today include:   Echocardiogram: 06/2020 IMPRESSIONS     1. Left ventricular ejection fraction, by estimation, is 55 to 60%. The  left  ventricle has normal function. The left ventricle has no regional  wall motion abnormalities. There is mild left ventricular hypertrophy.  Left ventricular diastolic parameters  are indeterminate. Elevated left atrial pressure.   2. Right ventricular systolic function is normal. The right ventricular  size is normal.   3. Left atrial size was moderately dilated.   4. The mitral valve is normal in structure. Trivial mitral valve  regurgitation. No evidence of mitral stenosis.   5. The aortic valve is tricuspid. There is mild calcification of the  aortic valve. There is mild thickening of the aortic valve. Aortic valve  regurgitation is not visualized. No aortic stenosis is present.   6. The inferior vena cava is normal in size with greater than 50%  respiratory variability, suggesting right atrial pressure of 3 mmHg.   NST: 01/2021 Blood pressure demonstrated a hypertensive response to exercise. There was no ST segment deviation noted during stress. The study is normal. There are no perfusion defects This is a low risk study. The left ventricular ejection fraction is normal (55-65%). Not able to calculate Duke treadmill score, protocol held at stage 2 due to shortness of breath.  Assessment:    1. Coronary artery disease involving native coronary artery of native heart without angina pectoris   2. Palpitations   3. Essential hypertension   4. Hyperlipidemia LDL goal <70      Plan:   In order of problems listed above:  1. CAD - He underwent stenting to the LAD and OM1 in 12/2016 with patent stents by cath in 03/2019. Most recent ischemic evaluation was a low-risk NST in 01/2021. He describes episodes of a burning chest pain when lying in his recliner but no exertional symptoms. Given that his pain resolves with water consumption and is not associated with exertion, suspect his symptoms are possibly secondary to reflux. Was previously prescribed PPI therapy but he did not wish to  take another prescription medication. Reviewed he could use TUMS as needed. I encouraged him to make [place] aware if he develops any exertional pain or symptoms resembling his prior angina.  - Remains on ASA  81 mg daily, Plavix 75 mg daily, Atorvastatin 80 mg daily and PRN SL NTG.   2. Palpitations - He had PAC's, PVC's and brief SVT by prior monitor. He reports occasional palpitations but these are typically most notable at night. Does not consume caffeine or alcohol. He has not been on beta-blocker therapy due to baseline bradycardia and would not restart given his HR in the 50's.   3. HTN - BP was initially elevated at 156/66, rechecked and improved to 138/78 during today's visit. Remains on Lisinopril 10 mg daily. Recent BMET showed his creatinine was stable at 1.06 when checked in 11/2021.  4. HLD  - FLP in 09/2021 showed total cholesterol 115, triglycerides 91, HDL 40 and LDL 57. Continue Atorvastatin 80 mg daily.   Medication Adjustments/Labs and Tests Ordered: Current medicines are reviewed at length with the patient today.  Concerns regarding medicines are outlined above.  Medication changes, Labs and Tests ordered today are listed in the Patient Instructions below. Patient Instructions  Medication Instructions:  Your physician recommends that you continue on your current medications as directed. Please refer to the Current Medication list given to you today.  *If you need a refill on your cardiac medications before your next appointment, please call your pharmacy*   Lab Work: None   If you have labs (blood work) drawn today and your tests are completely normal, you will receive your results only by: [place] (if you have MyChart) OR A paper copy in the mail If you have any lab test that is abnormal or we need to change your treatment, we will call you to review the results.   Testing/Procedures: None    Follow-Up: At [facility], you and your health needs are our  priority.  As part of our continuing mission to provide you with exceptional heart care, we have created designated Provider Care Teams.  These Care Teams include your primary Cardiologist (physician) and Advanced Practice Providers (APPs -  Physician Assistants and Nurse Practitioners) who all work together to provide you with the care you need, when you need it.  We recommend signing up for the patient portal called "MyChart".  Sign up information is provided on this After Visit Summary.  MyChart is used to connect with patients for Virtual Visits (Telemedicine).  Patients are able to view lab/test results, encounter notes, upcoming appointments, etc.  Non-urgent messages can be sent to your provider as well.   To learn more about what you can do with MyChart, go to [email or web address].    Your next appointment:   6 month(s)  The format for your next appointment:   In Person  Provider:   Rozann Lesches, MD    Other Instructions Thank you for choosing [facility]!    Important Information About Sugar         Signed, Erma Heritage, PA-C  04/13/2022 2:55 PM    [place] [address] [place], [place] [address] Phone: [phone] Fax: [phone]

## 2022-04-13 ENCOUNTER — Encounter: Payer: Self-pay | Admitting: Student

## 2022-04-13 ENCOUNTER — Ambulatory Visit: Payer: Medicare HMO | Admitting: Student

## 2022-04-13 ENCOUNTER — Ambulatory Visit: Payer: Medicare HMO | Admitting: Cardiology

## 2022-04-13 VITALS — BP 138/78 | HR 56 | Ht 70.0 in | Wt 195.0 lb

## 2022-04-13 DIAGNOSIS — E785 Hyperlipidemia, unspecified: Secondary | ICD-10-CM | POA: Diagnosis not present

## 2022-04-13 DIAGNOSIS — I10 Essential (primary) hypertension: Secondary | ICD-10-CM | POA: Diagnosis not present

## 2022-04-13 DIAGNOSIS — I2510 Atherosclerotic heart disease of native coronary artery without angina pectoris: Secondary | ICD-10-CM | POA: Diagnosis not present

## 2022-04-13 DIAGNOSIS — R002 Palpitations: Secondary | ICD-10-CM

## 2022-04-13 NOTE — Patient Instructions (Signed)
Medication Instructions:  Your physician recommends that you continue on your current medications as directed. Please refer to the Current Medication list given to you today.  *If you need a refill on your cardiac medications before your next appointment, please call your pharmacy*   Lab Work: None   If you have labs (blood work) drawn today and your tests are completely normal, you will receive your results only by: MyChart Message (if you have MyChart) OR A paper copy in the mail If you have any lab test that is abnormal or we need to change your treatment, we will call you to review the results.   Testing/Procedures: None    Follow-Up: At [facility], you and your health needs are our priority.  As part of our continuing mission to provide you with exceptional heart care, we have created designated Provider Care Teams.  These Care Teams include your primary Cardiologist (physician) and Advanced Practice Providers (APPs -  Physician Assistants and Nurse Practitioners) who all work together to provide you with the care you need, when you need it.  We recommend signing up for the patient portal called "MyChart".  Sign up information is provided on this After Visit Summary.  MyChart is used to connect with patients for Virtual Visits (Telemedicine).  Patients are able to view lab/test results, encounter notes, upcoming appointments, etc.  Non-urgent messages can be sent to your provider as well.   To learn more about what you can do with MyChart, go to [email or web address].    Your next appointment:   6 month(s)  The format for your next appointment:   In Person  Provider:   [name], MD    Other Instructions Thank you for choosing [facility]!    Important Information About Sugar

## 2022-07-03 ENCOUNTER — Emergency Department (HOSPITAL_COMMUNITY)
Admission: EM | Admit: 2022-07-03 | Discharge: 2022-07-03 | Disposition: A | Discharge status: Home / Self Care | Payer: Medicare HMO | Attending: Emergency Medicine | Admitting: Emergency Medicine

## 2022-07-03 ENCOUNTER — Encounter (HOSPITAL_COMMUNITY): Payer: Self-pay | Admitting: *Deleted

## 2022-07-03 ENCOUNTER — Emergency Department (HOSPITAL_COMMUNITY): Payer: Medicare HMO

## 2022-07-03 ENCOUNTER — Other Ambulatory Visit: Payer: Self-pay

## 2022-07-03 DIAGNOSIS — R072 Precordial pain: Secondary | ICD-10-CM | POA: Insufficient documentation

## 2022-07-03 DIAGNOSIS — E119 Type 2 diabetes mellitus without complications: Secondary | ICD-10-CM | POA: Insufficient documentation

## 2022-07-03 DIAGNOSIS — R42 Dizziness and giddiness: Secondary | ICD-10-CM | POA: Insufficient documentation

## 2022-07-03 DIAGNOSIS — I2510 Atherosclerotic heart disease of native coronary artery without angina pectoris: Secondary | ICD-10-CM | POA: Insufficient documentation

## 2022-07-03 DIAGNOSIS — Z7902 Long term (current) use of antithrombotics/antiplatelets: Secondary | ICD-10-CM | POA: Diagnosis not present

## 2022-07-03 DIAGNOSIS — R110 Nausea: Secondary | ICD-10-CM | POA: Diagnosis not present

## 2022-07-03 DIAGNOSIS — Z7984 Long term (current) use of oral hypoglycemic drugs: Secondary | ICD-10-CM | POA: Insufficient documentation

## 2022-07-03 DIAGNOSIS — R61 Generalized hyperhidrosis: Secondary | ICD-10-CM | POA: Diagnosis not present

## 2022-07-03 DIAGNOSIS — Z7982 Long term (current) use of aspirin: Secondary | ICD-10-CM | POA: Insufficient documentation

## 2022-07-03 LAB — CBC
HCT: 45.7 % (ref 39.0–52.0)
Hemoglobin: 15.2 g/dL (ref 13.0–17.0)
MCH: 30.5 pg (ref 26.0–34.0)
MCHC: 33.3 g/dL (ref 30.0–36.0)
MCV: 91.8 fL (ref 80.0–100.0)
Platelets: 170 10*3/uL (ref 150–400)
RBC: 4.98 MIL/uL (ref 4.22–5.81)
RDW: 13.5 % (ref 11.5–15.5)
WBC: 7.2 10*3/uL (ref 4.0–10.5)
nRBC: 0 % (ref 0.0–0.2)

## 2022-07-03 LAB — BASIC METABOLIC PANEL
Anion gap: 6 (ref 5–15)
BUN: 19 mg/dL (ref 8–23)
CO2: 25 mmol/L (ref 22–32)
Calcium: 9.2 mg/dL (ref 8.9–10.3)
Chloride: 106 mmol/L (ref 98–111)
Creatinine, Ser: 0.83 mg/dL (ref 0.61–1.24)
GFR, Estimated: >60 mL/min (ref >60)
Glucose, Bld: 141 mg/dL — ABNORMAL HIGH (ref 70–99)
Potassium: 4.1 mmol/L (ref 3.5–5.1)
Sodium: 137 mmol/L (ref 135–145)

## 2022-07-03 LAB — TROPONIN I (HIGH SENSITIVITY)
Troponin I (High Sensitivity): 4 ng/L (ref <18)
Troponin I (High Sensitivity): 5 ng/L (ref <18)

## 2022-07-03 MED ORDER — IOHEXOL 350 MG/ML SOLN
100.0000 mL | Freq: Once | INTRAVENOUS | Status: AC | PRN
  Administered 2022-07-03: 75 mL via INTRAVENOUS

## 2022-07-03 NOTE — ED Notes (Signed)
Patient transported to CT 

## 2022-07-03 NOTE — ED Triage Notes (Signed)
Pt c/o chest pain that started this am; pt states he has had 3 episodes of mid sternal chest pain; pt states he feels nauseous and feels like he is going to pass out; pt states he feels dizzy and lightheaded with the episodes

## 2022-07-03 NOTE — Discharge Instructions (Signed)
Work-up today for the chest pain without acute findings.  Including CT of the chest that rules out blood clots in the lungs.  Return for any new or worse or different symptoms.  Make an appointment to follow-up with cardiology.

## 2022-07-03 NOTE — ED Provider Notes (Addendum)
[facility] EMERGENCY DEPARTMENT Provider Note   CSN: [id number] Arrival date & time: 07/03/22  6283     History  Chief Complaint  Patient presents with   Chest Pain    Shawn Lopez is a 81 y.o. male.  Patient at around 7 this morning with acute onset of intermittent substernal chest discomfort lasting no more than 3 minutes.  This occurred about 3 or 4 times.  Patient currently completely asymptomatic.  When this occurs it is associated with some nausea and diaphoresis.  He also feels lightheaded and dizzy when they occur.  Past medical history is significant for known coronary artery disease type 2 diabetes hyperlipidemia atrial fibrillation chronic back pain hypertension past surgical history significant for appendectomy cholecystectomy last left heart cath was in 2020 stent placement in 2018 followed by cardiology here.  Patient is a former smoker quit in 1998.  Patient last seen in the emergency department March 23 for nonspecific chest pain.       Home Medications Prior to Admission medications   Medication Sig Start Date End Date Taking? Authorizing Provider  aspirin EC 81 MG EC tablet Take 1 tablet (81 mg total) by mouth daily. 01/17/17  Yes Ghimire, Werner Lean, MD  atorvastatin (LIPITOR) 80 MG tablet Take 1 tablet (80 mg total) by mouth daily at 6 PM. 03/09/17  Yes Jodelle Gross, NP  clopidogrel (PLAVIX) 75 MG tablet Take 1 tablet (75 mg total) by mouth daily. 03/09/17  Yes Jodelle Gross, NP  glimepiride (AMARYL) 2 MG tablet Take 1 mg by mouth daily. 10/13/21  Yes [provider]  lisinopril (ZESTRIL) 10 MG tablet Take 1 tablet (10 mg total) by mouth daily. Patient not taking: Reported on 07/03/2022 04/16/20   Netta Neat., NP  nitroGLYCERIN (NITROSTAT) 0.4 MG SL tablet Place 1 tablet (0.4 mg total) under the tongue every 5 (five) minutes as needed for chest pain. For 3 doses only 01/16/17   Ghimire, Werner Lean, MD      Allergies    Brilinta  [ticagrelor]    Review of Systems   Review of Systems  Constitutional:  Positive for diaphoresis. Negative for chills and fever.  HENT:  Negative for ear pain and sore throat.   Eyes:  Negative for pain and visual disturbance.  Respiratory:  Negative for cough and shortness of breath.   Cardiovascular:  Positive for chest pain. Negative for palpitations.  Gastrointestinal:  Positive for nausea. Negative for abdominal pain and vomiting.  Genitourinary:  Negative for dysuria and hematuria.  Musculoskeletal:  Negative for arthralgias and back pain.  Skin:  Negative for color change and rash.  Neurological:  Positive for light-headedness. Negative for seizures and syncope.  All other systems reviewed and are negative.   Physical Exam Updated Vital Signs BP 137/69   Pulse (!) 52   Temp 97.9 F (36.6 C) (Oral)   Resp 16   Ht 1.803 m (5\' 11" )   Wt 81.6 kg   SpO2 94%   BMI 25.10 kg/m  Physical Exam Vitals and nursing note reviewed.  Constitutional:      General: He is not in acute distress.    Appearance: He is well-developed. He is not ill-appearing, toxic-appearing or diaphoretic.  HENT:     Head: Normocephalic and atraumatic.  Eyes:     Extraocular Movements: Extraocular movements intact.     Conjunctiva/sclera: Conjunctivae normal.     Pupils: Pupils are equal, round, and reactive to light.  Cardiovascular:  Rate and Rhythm: Normal rate and regular rhythm.     Heart sounds: No murmur heard. Pulmonary:     Effort: Pulmonary effort is normal. No respiratory distress.     Breath sounds: Normal breath sounds. No decreased breath sounds, wheezing, rhonchi or rales.  Chest:     Chest wall: No tenderness.  Abdominal:     Palpations: Abdomen is soft.     Tenderness: There is no abdominal tenderness.  Musculoskeletal:        General: No swelling. Normal range of motion.     Cervical back: Neck supple.     Right lower leg: No edema.     Left lower leg: No edema.  Skin:     General: Skin is warm and dry.     Capillary Refill: Capillary refill takes less than 2 seconds.  Neurological:     General: No focal deficit present.     Mental Status: He is alert and oriented to person, place, and time.  Psychiatric:        Mood and Affect: Mood normal.     ED Results / Procedures / Treatments   Labs (all labs ordered are listed, but only abnormal results are displayed) Labs Reviewed  BASIC METABOLIC PANEL - Abnormal; Notable for the following components:      Result Value   Glucose, Bld 141 (*)    All other components within normal limits  CBC  TROPONIN I (HIGH SENSITIVITY)  TROPONIN I (HIGH SENSITIVITY)    EKG EKG Interpretation  Date/Time:  Monday July 03 2022 10:00:00 EDT Ventricular Rate:  60 PR Interval:  169 QRS Duration: 108 QT Interval:  417 QTC Calculation: 417 R Axis:   66 Text Interpretation: Sinus rhythm Borderline repolarization abnormality No significant change since last tracing Confirmed by Vanetta Mulders [phone]) on 07/03/2022 10:17:50 AM  Radiology DG Chest Port 1 View  Result Date: 07/03/2022 CLINICAL DATA:  Pain EXAM: PORTABLE CHEST 1 VIEW COMPARISON:  None Available. FINDINGS: The heart size and mediastinal contours are within normal limits. Low lung volumes. Mild linear left basilar opacities. No visible pleural effusions or pneumothorax. No acute osseous abnormality. IMPRESSION: Low lung volumes with mild linear left basilar opacities, favor atelectasis. Electronically Signed   By: Feliberto Harts M.D.   On: 07/03/2022 10:45    Procedures Procedures    Medications Ordered in ED Medications - No data to display  ED Course/ Medical Decision Making/ A&P                           Medical Decision Making Amount and/or Complexity of Data Reviewed Radiology: ordered.  Risk Prescription drug management.   Patient's history is somewhat concerning for arrhythmia or perhaps unstable angina picture.  But the episodes of  chest discomfort are very brief.  Patient currently asymptomatic.  Cardiac monitoring without significant abnormalities.  Patient's twelve-lead EKG without significant changes from previous.  Chest x-ray low lung volumes with mild linear left basilar opacities favor atelectasis.  Initial troponin is 4 will need delta troponin CBC normal no leukocytosis hemoglobin 15.2 patient metabolic panel blood sugar up a little bit at 141 renal function normal electrolytes normal.  Continue cardiac monitoring and will get delta troponin.  Patient's renal function is normal.  We will going get CT angio of the oxygen sats are good.  Get it we will give a high death picture of the chest and rule out any PE as part of  the equation while awaiting for delta troponin to come back.  Patient delta troponin is very stable.  CT angio chest no evidence of pulmonary embolism is got known coronary artery disease no acute cardiopulmonary disease.  Patient stable for discharge home close follow-up with cardiology.  Patient currently asymptomatic.   Final Clinical Impression(s) / ED Diagnoses Final diagnoses:  Precordial pain    Rx / DC Orders ED Discharge Orders     None         Fredia Sorrow, MD 07/03/22 1143    Fredia Sorrow, MD 07/03/22 1533    Fredia Sorrow, MD 07/03/22 1534

## 2022-07-18 ENCOUNTER — Ambulatory Visit: Payer: Medicare HMO | Attending: Cardiology | Admitting: Cardiology

## 2022-07-18 ENCOUNTER — Encounter: Payer: Self-pay | Admitting: Cardiology

## 2022-07-18 VITALS — BP 146/72 | HR 57 | Ht 70.0 in | Wt 188.0 lb

## 2022-07-18 DIAGNOSIS — I25119 Atherosclerotic heart disease of native coronary artery with unspecified angina pectoris: Secondary | ICD-10-CM

## 2022-07-18 DIAGNOSIS — I10 Essential (primary) hypertension: Secondary | ICD-10-CM

## 2022-07-18 DIAGNOSIS — E782 Mixed hyperlipidemia: Secondary | ICD-10-CM

## 2022-07-18 MED ORDER — ISOSORBIDE MONONITRATE ER 30 MG PO TB24: 30.0000 mg | ORAL_TABLET | Freq: Every evening | ORAL | 1 refills | Status: DC

## 2022-07-18 NOTE — Progress Notes (Signed)
Cardiology Office Note  Date: 07/18/2022   ID: [name], [name] 1941/09/06, MRN [id number]  PCP:  Lemmie Evens, MD  Cardiologist:  Rozann Lesches, MD Electrophysiologist:  None   Chief Complaint  Patient presents with   Cardiac follow-up    History of Present Illness: Shawn Lopez is an 81 y.o. male last seen in July by Ms. Strader PA-C.  He was seen earlier this month in the ER for evaluation of chest pain, I reviewed the note.  High-sensitivity troponin levels were normal and ECG showed no acute ST segment changes.  Chest CTA reported no evidence of pulmonary embolus with coronary atherosclerosis.  He tells me that he has been having intermittent chest tightness for several months in fact.  Has used nitroglycerin once but not on a consistent basis.  States that he has felt dizzy while taking lisinopril and stopped it about a month ago as well.  Stress testing from last year was low risk as noted below.  Still working as a Futures trader at age 52.  Past Medical History:  Diagnosis Date   Arthritis    Atrial fibrillation ([place]) 2006   a. in 2006; not on anticoagulation, no recent atrial fib seen as of 2018.   Borderline hypertension    CAD (coronary artery disease)    a. NSTEMI 12/2016: cath showing 95% mid-LAD stenosis, 75% prox-LAD stenosis and 75% 1st Diag stenosis. DES to the proximal and mid-LAD along with the 1st Diagonal..   Chronic back pain    Prior trauma with vertebral fracture   Hyperlipidemia    Hypertension    Osteopenia    Sinus bradycardia    Type 2 diabetes mellitus ([place])     Past Surgical History:  Procedure Laterality Date   [place] EXTRACTION  2012   right eye,lens implantation   CHOLECYSTECTOMY     CIRCUMCISION  2010   meatotomy and dilatation   COLONOSCOPY N/A 01/14/2013   Procedure: COLONOSCOPY;  Surgeon: Jamesetta So, MD;  [facility];  Service: Gastroenterology;  Laterality:  N/A;   CORONARY ANGIOGRAPHY N/A 01/15/2017   Procedure: Coronary Angiography;  Surgeon: Lorretta Harp, MD;  Location: [facility];  Service: Cardiovascular;  Laterality: N/A;   CORONARY STENT INTERVENTION N/A 01/15/2017   Procedure: Coronary Stent Intervention;  Surgeon: Lorretta Harp, MD;  Location: [facility];  Service: Cardiovascular;  Laterality: N/A;   INTRAVASCULAR PRESSURE WIRE/FFR STUDY N/A 04/04/2019   Procedure: INTRAVASCULAR PRESSURE WIRE/FFR STUDY;  Surgeon: Nelva Bush, MD;  Location: [facility];  Service: Cardiovascular;  Laterality: N/A;   LEFT HEART CATH AND CORONARY ANGIOGRAPHY N/A 04/04/2019   Procedure: LEFT HEART CATH AND CORONARY ANGIOGRAPHY;  Surgeon: Nelva Bush, MD;  Location: [facility];  Service: Cardiovascular;  Laterality: N/A;    Current Outpatient Medications  Medication Sig Dispense Refill   aspirin EC 81 MG EC tablet Take 1 tablet (81 mg total) by mouth daily. 30 tablet 0   atorvastatin (LIPITOR) 80 MG tablet Take 1 tablet (80 mg total) by mouth daily at 6 PM. 90 tablet 3   clopidogrel (PLAVIX) 75 MG tablet Take 1 tablet (75 mg total) by mouth daily. 90 tablet 3   glimepiride (AMARYL) 2 MG tablet Take 1 mg by mouth daily.     isosorbide mononitrate (IMDUR) 30 MG 24 hr tablet Take 1 tablet (30 mg total) by mouth every evening. If headaches start  to subside after 2 weeks, may decrease to 15 mg once in the evening. 90 tablet 1   nitroGLYCERIN (NITROSTAT) 0.4 MG SL tablet Place 1 tablet (0.4 mg total) under the tongue every 5 (five) minutes as needed for chest pain. For 3 doses only 30 tablet 0   No current facility-administered medications for this visit.   Allergies:  Brilinta [ticagrelor]   ROS: No palpitations or syncope.  Hearing loss.  Physical Exam: VS:  BP (!) 146/72   Pulse (!) 57   Ht 5\' 10"  (1.778 m)   Wt 188 lb (85.3 kg)   SpO2 96%   BMI 26.98 kg/m , BMI Body mass index is 26.98 kg/m.  Wt Readings  from Last 3 Encounters:  07/18/22 188 lb (85.3 kg)  07/03/22 180 lb (81.6 kg)  04/13/22 195 lb (88.5 kg)    General: Patient appears comfortable at rest. HEENT: Conjunctiva and lids normal. Neck: Supple, no elevated JVP or carotid bruits. Lungs: Clear to auscultation, nonlabored breathing at rest. Cardiac: Regular rate and rhythm, no S3, 1/6 systolic murmur, no pericardial rub. Extremities: No pitting edema.  ECG:  An ECG dated 07/03/2022 was personally reviewed today and demonstrated:  Sinus rhythm.  Recent Labwork: 07/03/2022: BUN 19; Creatinine, Ser 0.83; Hemoglobin 15.2; Platelets 170; Potassium 4.1; Sodium 137     Component Value Date/Time   CHOL 115 10/14/2021 0914   TRIG 91 10/14/2021 0914   HDL 40 10/14/2021 0914   CHOLHDL 2.9 10/14/2021 0914   CHOLHDL 3.4 01/15/2017 0702   VLDL 32 01/15/2017 0702   LDLCALC 57 10/14/2021 0914    Other Studies Reviewed Today:  Echocardiogram 07/23/2020:  1. Left ventricular ejection fraction, by estimation, is 55 to 60%. The  left ventricle has normal function. The left ventricle has no regional  wall motion abnormalities. There is mild left ventricular hypertrophy.  Left ventricular diastolic parameters  are indeterminate. Elevated left atrial pressure.   2. Right ventricular systolic function is normal. The right ventricular  size is normal.   3. Left atrial size was moderately dilated.   4. The mitral valve is normal in structure. Trivial mitral valve  regurgitation. No evidence of mitral stenosis.   5. The aortic valve is tricuspid. There is mild calcification of the  aortic valve. There is mild thickening of the aortic valve. Aortic valve  regurgitation is not visualized. No aortic stenosis is present.   6. The inferior vena cava is normal in size with greater than 50%  respiratory variability, suggesting right atrial pressure of 3 mmHg.   Exercise Myoview 02/09/2021: Blood pressure demonstrated a hypertensive response to  exercise. There was no ST segment deviation noted during stress. The study is normal. There are no perfusion defects This is a low risk study. The left ventricular ejection fraction is normal (55-65%). Not able to calculate Duke treadmill score, protocol held at stage 2 due to shortness of breath.  Assessment and Plan:  1.  CAD with angina, history of DES in the proximal and mid LAD as well as first diagonal in 2018.  Exercise Myoview from last year was low risk.  Recent evaluation in ER demonstrated normal cardiac enzymes and no acute ST segment changes by ECG.  Continue aspirin, Plavix, Lipitor, and add Imdur beginning at 15 mg in the evening and uptitrate to 30 mg in the evening as tolerated.  Hold off on resumption of lisinopril for now.  2.  Mixed hyperlipidemia, remains on Lipitor with last LDL 57.  3.  Essential hypertension, systolic in the 140s today.  He has had dizziness while taking low-dose lisinopril and stopped it about a month ago.  Continue to observe while adjusting other antianginal therapy.  Medication Adjustments/Labs and Tests Ordered: Current medicines are reviewed at length with the patient today.  Concerns regarding medicines are outlined above.   Tests Ordered: No orders of the defined types were placed in this encounter.   Medication Changes: Meds ordered this encounter  Medications   isosorbide mononitrate (IMDUR) 30 MG 24 hr tablet    Sig: Take 1 tablet (30 mg total) by mouth every evening. If headaches start to subside after 2 weeks, may decrease to 15 mg once in the evening.    Dispense:  90 tablet    Refill:  1    07/18/22 New Start    Disposition:  Follow up  3 months.  Signed, Jonelle Sidle, MD, [facility] 07/18/2022 3:16 PM    [facility] at [facility] [address] [place], [place], [place] [address] Phone: [phone]; Fax: [phone]

## 2022-07-18 NOTE — Patient Instructions (Signed)
Medication Instructions:  Your physician has recommended you make the following change in your medication:  Stop lisinopril Start Imdur 30 mg once in the evening. If headaches subside after 2 weeks, decrease to 15 mg once on the evening Continue all other medications as directed  Labwork: none  Testing/Procedures: none  Follow-Up:  Your physician recommends that you schedule a follow-up appointment in: 3 months  Any Other Special Instructions Will Be Listed Below (If Applicable).  If you need a refill on your cardiac medications before your next appointment, please call your pharmacy.

## 2022-10-23 ENCOUNTER — Encounter (INDEPENDENT_AMBULATORY_CARE_PROVIDER_SITE_OTHER): Payer: Self-pay | Admitting: *Deleted

## 2022-11-02 ENCOUNTER — Ambulatory Visit: Payer: Medicare HMO | Attending: Cardiology | Admitting: Cardiology

## 2022-11-02 ENCOUNTER — Encounter: Payer: Self-pay | Admitting: Cardiology

## 2022-11-02 VITALS — BP 156/70 | HR 77 | Ht 70.0 in | Wt 187.6 lb

## 2022-11-02 DIAGNOSIS — E782 Mixed hyperlipidemia: Secondary | ICD-10-CM

## 2022-11-02 DIAGNOSIS — I25119 Atherosclerotic heart disease of native coronary artery with unspecified angina pectoris: Secondary | ICD-10-CM

## 2022-11-02 NOTE — Patient Instructions (Addendum)
Medication Instructions:  Your physician has recommended you make the following change in your medication:  Stop isosorbide mononitrate Continue other medications the same  Labwork: none  Testing/Procedures: none  Follow-Up: Your physician recommends that you schedule a follow-up appointment in: 6 months  Any Other Special Instructions Will Be Listed Below (If Applicable).  If you need a refill on your cardiac medications before your next appointment, please call your pharmacy.

## 2022-11-02 NOTE — Progress Notes (Signed)
Cardiology Office Note  Date: 11/02/2022   ID: Shawn Lopez 10-07-1940, MRN [id number]  PCP:  Lemmie Evens, MD  Cardiologist:  Rozann Lesches, MD Electrophysiologist:  None   Chief Complaint  Patient presents with   Cardiac follow-up    History of Present Illness: Shawn Lopez is an 82 y.o. male last seen in October 2023.  He is here for a follow-up visit.  We attempted to trial of Imdur for angina control at the last visit, he tried this for about a month but did not tolerate it due to headaches and feeling fatigued.  He was in the interim started on antiacid by his PCP and feels like his symptoms are better.  I went over his current cardiac regimen and we discussed stopping Imdur going forward.  Additional antianginal benefit might be achieved with Norvasc if necessary, but for now we will hold with current medical therapy.  Past Medical History:  Diagnosis Date   Arthritis    Atrial fibrillation ([place]) 2006   a. in 2006; not on anticoagulation, no recent atrial fib seen as of 2018.   Borderline hypertension    CAD (coronary artery disease)    a. NSTEMI 12/2016: cath showing 95% mid-LAD stenosis, 75% prox-LAD stenosis and 75% 1st Diag stenosis. DES to the proximal and mid-LAD along with the 1st Diagonal..   Chronic back pain    Prior trauma with vertebral fracture   Hyperlipidemia    Hypertension    Osteopenia    Sinus bradycardia    Type 2 diabetes mellitus ([facility])     Current Outpatient Medications  Medication Sig Dispense Refill   aspirin EC 81 MG EC tablet Take 1 tablet (81 mg total) by mouth daily. 30 tablet 0   atorvastatin (LIPITOR) 80 MG tablet Take 1 tablet (80 mg total) by mouth daily at 6 PM. 90 tablet 3   clopidogrel (PLAVIX) 75 MG tablet Take 1 tablet (75 mg total) by mouth daily. 90 tablet 3   glimepiride (AMARYL) 2 MG tablet Take 1 mg by mouth daily.     HYDROcodone-acetaminophen (NORCO) 10-325 MG tablet Take 1 tablet by mouth every  4 (four) hours as needed for moderate pain or severe pain.     nitroGLYCERIN (NITROSTAT) 0.4 MG SL tablet Place 1 tablet (0.4 mg total) under the tongue every 5 (five) minutes as needed for chest pain. For 3 doses only 30 tablet 0   No current facility-administered medications for this visit.   Allergies:  Brilinta [ticagrelor]   ROS: No palpitations or syncope.  Physical Exam: VS:  BP (!) 156/70   Pulse 77   Ht 5\' 10"  (1.778 m)   Wt 187 lb 9.6 oz (85.1 kg)   SpO2 97%   BMI 26.92 kg/m , BMI Body mass index is 26.92 kg/m.  Wt Readings from Last 3 Encounters:  11/02/22 187 lb 9.6 oz (85.1 kg)  07/18/22 188 lb (85.3 kg)  07/03/22 180 lb (81.6 kg)    General: Patient appears comfortable at rest. HEENT: Conjunctiva and lids normal. Neck: Supple, no elevated JVP or carotid bruits. Lungs: Clear to auscultation, nonlabored breathing at rest. Cardiac: Regular rate and rhythm, no S3, 1/6 systolic murmur.  ECG:  An ECG dated 07/03/2022 was personally reviewed today and demonstrated:  Sinus rhythm.  Recent Labwork: 07/03/2022: BUN 19; Creatinine, Ser 0.83; Hemoglobin 15.2; Platelets 170; Potassium 4.1; Sodium 137     Component Value Date/Time   CHOL 115 10/14/2021 0914  TRIG 91 10/14/2021 0914   HDL 40 10/14/2021 0914   CHOLHDL 2.9 10/14/2021 0914   CHOLHDL 3.4 01/15/2017 0702   VLDL 32 01/15/2017 0702   LDLCALC 57 10/14/2021 0914    Other Studies Reviewed Today:  Echocardiogram 07/23/2020:  1. Left ventricular ejection fraction, by estimation, is 55 to 60%. The  left ventricle has normal function. The left ventricle has no regional  wall motion abnormalities. There is mild left ventricular hypertrophy.  Left ventricular diastolic parameters  are indeterminate. Elevated left atrial pressure.   2. Right ventricular systolic function is normal. The right ventricular  size is normal.   3. Left atrial size was moderately dilated.   4. The mitral valve is normal in structure.  Trivial mitral valve  regurgitation. No evidence of mitral stenosis.   5. The aortic valve is tricuspid. There is mild calcification of the  aortic valve. There is mild thickening of the aortic valve. Aortic valve  regurgitation is not visualized. No aortic stenosis is present.   6. The inferior vena cava is normal in size with greater than 50%  respiratory variability, suggesting right atrial pressure of 3 mmHg.    Exercise Myoview 02/09/2021: Blood pressure demonstrated a hypertensive response to exercise. There was no ST segment deviation noted during stress. The study is normal. There are no perfusion defects This is a low risk study. The left ventricular ejection fraction is normal (55-65%). Not able to calculate Duke treadmill score, protocol held at stage 2 due to shortness of breath.  Assessment and Plan:  1.  CAD with history of DES to the proximal and mid LAD as well as first diagonal in 2018.  Ischemic testing from last year was low risk.  He did not tolerate Imdur as antianginal due to headaches and fatigue.  Concurrently, states that he feels somewhat better since starting an antacid so some symptoms could be GI in etiology.  We will plan to continue the current regimen including aspirin, Plavix, Lipitor, and as needed nitroglycerin.  Norvasc could be considered next if needed.  2.  Mixed hyperlipidemia on Lipitor, last LDL 57.  Medication Adjustments/Labs and Tests Ordered: Current medicines are reviewed at length with the patient today.  Concerns regarding medicines are outlined above.   Tests Ordered: No orders of the defined types were placed in this encounter.   Medication Changes: No orders of the defined types were placed in this encounter.   Disposition:  Follow up  6 months.  Signed, Satira Sark, MD, [facility] 11/02/2022 2:59 PM    [facility] [place], [place], [place] [address] Phone: [phone]; Fax: [phone]

## 2022-11-23 ENCOUNTER — Other Ambulatory Visit: Payer: Self-pay

## 2022-11-23 ENCOUNTER — Emergency Department (HOSPITAL_COMMUNITY): Payer: Medicare HMO

## 2022-11-23 ENCOUNTER — Observation Stay (HOSPITAL_COMMUNITY)
Admission: EM | Admit: 2022-11-23 | Discharge: 2022-11-24 | Disposition: A | Discharge status: Home / Self Care | Payer: Medicare HMO | Attending: Family Medicine | Admitting: Family Medicine

## 2022-11-23 ENCOUNTER — Encounter: Payer: Self-pay | Admitting: Radiology

## 2022-11-23 ENCOUNTER — Encounter (HOSPITAL_COMMUNITY): Payer: Self-pay

## 2022-11-23 DIAGNOSIS — E119 Type 2 diabetes mellitus without complications: Secondary | ICD-10-CM | POA: Diagnosis not present

## 2022-11-23 DIAGNOSIS — R079 Chest pain, unspecified: Secondary | ICD-10-CM | POA: Diagnosis present

## 2022-11-23 DIAGNOSIS — R2689 Other abnormalities of gait and mobility: Secondary | ICD-10-CM | POA: Diagnosis not present

## 2022-11-23 DIAGNOSIS — R072 Precordial pain: Secondary | ICD-10-CM | POA: Diagnosis present

## 2022-11-23 DIAGNOSIS — M6281 Muscle weakness (generalized): Secondary | ICD-10-CM | POA: Insufficient documentation

## 2022-11-23 DIAGNOSIS — R2681 Unsteadiness on feet: Secondary | ICD-10-CM | POA: Insufficient documentation

## 2022-11-23 DIAGNOSIS — Z955 Presence of coronary angioplasty implant and graft: Secondary | ICD-10-CM | POA: Diagnosis not present

## 2022-11-23 DIAGNOSIS — Z87891 Personal history of nicotine dependence: Secondary | ICD-10-CM | POA: Diagnosis not present

## 2022-11-23 DIAGNOSIS — R0602 Shortness of breath: Secondary | ICD-10-CM | POA: Diagnosis not present

## 2022-11-23 DIAGNOSIS — E1169 Type 2 diabetes mellitus with other specified complication: Secondary | ICD-10-CM

## 2022-11-23 DIAGNOSIS — E785 Hyperlipidemia, unspecified: Secondary | ICD-10-CM | POA: Diagnosis present

## 2022-11-23 DIAGNOSIS — I2510 Atherosclerotic heart disease of native coronary artery without angina pectoris: Secondary | ICD-10-CM | POA: Diagnosis not present

## 2022-11-23 DIAGNOSIS — Z7902 Long term (current) use of antithrombotics/antiplatelets: Secondary | ICD-10-CM | POA: Insufficient documentation

## 2022-11-23 DIAGNOSIS — Z79899 Other long term (current) drug therapy: Secondary | ICD-10-CM | POA: Diagnosis not present

## 2022-11-23 DIAGNOSIS — I10 Essential (primary) hypertension: Secondary | ICD-10-CM | POA: Diagnosis not present

## 2022-11-23 DIAGNOSIS — Z7982 Long term (current) use of aspirin: Secondary | ICD-10-CM | POA: Diagnosis not present

## 2022-11-23 LAB — URINALYSIS, ROUTINE W REFLEX MICROSCOPIC
Bilirubin Urine: NEGATIVE
Glucose, UA: NEGATIVE mg/dL
Hgb urine dipstick: NEGATIVE
Ketones, ur: NEGATIVE mg/dL
Leukocytes,Ua: NEGATIVE
Nitrite: NEGATIVE
Protein, ur: NEGATIVE mg/dL
Specific Gravity, Urine: 1.003 — ABNORMAL LOW (ref 1.005–1.030)
pH: 7 (ref 5.0–8.0)

## 2022-11-23 LAB — CBC WITH DIFFERENTIAL/PLATELET
Abs Immature Granulocytes: 0.02 10*3/uL (ref 0.00–0.07)
Basophils Absolute: 0.1 10*3/uL (ref 0.0–0.1)
Basophils Relative: 1 %
Eosinophils Absolute: 0.2 10*3/uL (ref 0.0–0.5)
Eosinophils Relative: 3 %
HCT: 44.7 % (ref 39.0–52.0)
Hemoglobin: 14.9 g/dL (ref 13.0–17.0)
Immature Granulocytes: 0 %
Lymphocytes Relative: 23 %
Lymphs Abs: 1.7 10*3/uL (ref 0.7–4.0)
MCH: 30.5 pg (ref 26.0–34.0)
MCHC: 33.3 g/dL (ref 30.0–36.0)
MCV: 91.4 fL (ref 80.0–100.0)
Monocytes Absolute: 0.8 10*3/uL (ref 0.1–1.0)
Monocytes Relative: 10 %
Neutro Abs: 4.6 10*3/uL (ref 1.7–7.7)
Neutrophils Relative %: 63 %
Platelets: 173 10*3/uL (ref 150–400)
RBC: 4.89 MIL/uL (ref 4.22–5.81)
RDW: 13.1 % (ref 11.5–15.5)
WBC: 7.3 10*3/uL (ref 4.0–10.5)
nRBC: 0 % (ref 0.0–0.2)

## 2022-11-23 LAB — MAGNESIUM: Magnesium: 2 mg/dL (ref 1.7–2.4)

## 2022-11-23 LAB — BRAIN NATRIURETIC PEPTIDE: B Natriuretic Peptide: 39 pg/mL (ref 0.0–100.0)

## 2022-11-23 LAB — BASIC METABOLIC PANEL
Anion gap: 8 (ref 5–15)
BUN: 11 mg/dL (ref 8–23)
CO2: 24 mmol/L (ref 22–32)
Calcium: 8.8 mg/dL — ABNORMAL LOW (ref 8.9–10.3)
Chloride: 103 mmol/L (ref 98–111)
Creatinine, Ser: 0.83 mg/dL (ref 0.61–1.24)
GFR, Estimated: >60 mL/min (ref >60)
Glucose, Bld: 152 mg/dL — ABNORMAL HIGH (ref 70–99)
Potassium: 3.8 mmol/L (ref 3.5–5.1)
Sodium: 135 mmol/L (ref 135–145)

## 2022-11-23 LAB — CBG MONITORING, ED: Glucose-Capillary: 179 mg/dL — ABNORMAL HIGH (ref 70–99)

## 2022-11-23 LAB — TROPONIN I (HIGH SENSITIVITY)
Troponin I (High Sensitivity): 6 ng/L (ref <18)
Troponin I (High Sensitivity): 7 ng/L (ref <18)

## 2022-11-23 MED ORDER — OXYCODONE HCL 5 MG PO TABS
5.0000 mg | ORAL_TABLET | ORAL | Status: DC | PRN
  Administered 2022-11-23: 5 mg via ORAL
  Filled 2022-11-23: qty 1

## 2022-11-23 MED ORDER — ACETAMINOPHEN 325 MG PO TABS: 650.0000 mg | ORAL_TABLET | Freq: Four times a day (QID) | ORAL | Status: DC | PRN

## 2022-11-23 MED ORDER — PANTOPRAZOLE SODIUM 40 MG PO TBEC
40.0000 mg | DELAYED_RELEASE_TABLET | Freq: Every day | ORAL | Status: DC
  Administered 2022-11-23 – 2022-11-24 (×2): 40 mg via ORAL
  Filled 2022-11-23 (×2): qty 1

## 2022-11-23 MED ORDER — ATORVASTATIN CALCIUM 40 MG PO TABS: 80.0000 mg | ORAL_TABLET | Freq: Every day | ORAL | Status: DC

## 2022-11-23 MED ORDER — CLOPIDOGREL BISULFATE 75 MG PO TABS
75.0000 mg | ORAL_TABLET | Freq: Every day | ORAL | Status: DC
  Administered 2022-11-23 – 2022-11-24 (×2): 75 mg via ORAL
  Filled 2022-11-23 (×2): qty 1

## 2022-11-23 MED ORDER — ONDANSETRON HCL 4 MG/2ML IJ SOLN: 4.0000 mg | Freq: Four times a day (QID) | INTRAMUSCULAR | Status: DC | PRN

## 2022-11-23 MED ORDER — ACETAMINOPHEN 650 MG RE SUPP: 650.0000 mg | Freq: Four times a day (QID) | RECTAL | Status: DC | PRN

## 2022-11-23 MED ORDER — INSULIN ASPART 100 UNIT/ML IJ SOLN: 0.0000 [IU] | Freq: Every day | INTRAMUSCULAR | Status: DC

## 2022-11-23 MED ORDER — HEPARIN SODIUM (PORCINE) 5000 UNIT/ML IJ SOLN
5000.0000 [IU] | Freq: Three times a day (TID) | INTRAMUSCULAR | Status: DC
  Administered 2022-11-23 – 2022-11-24 (×3): 5000 [IU] via SUBCUTANEOUS
  Filled 2022-11-23 (×3): qty 1

## 2022-11-23 MED ORDER — ONDANSETRON HCL 4 MG PO TABS: 4.0000 mg | ORAL_TABLET | Freq: Four times a day (QID) | ORAL | Status: DC | PRN

## 2022-11-23 MED ORDER — ASPIRIN 81 MG PO TBEC
81.0000 mg | DELAYED_RELEASE_TABLET | Freq: Every day | ORAL | Status: DC
  Administered 2022-11-24: 81 mg via ORAL
  Filled 2022-11-23: qty 1

## 2022-11-23 MED ORDER — INSULIN ASPART 100 UNIT/ML IJ SOLN
0.0000 [IU] | Freq: Three times a day (TID) | INTRAMUSCULAR | Status: DC
  Administered 2022-11-24: 2 [IU] via SUBCUTANEOUS

## 2022-11-23 MED ORDER — ASPIRIN 325 MG PO TABS
325.0000 mg | ORAL_TABLET | Freq: Once | ORAL | Status: AC
  Administered 2022-11-23: 325 mg via ORAL
  Filled 2022-11-23: qty 1

## 2022-11-23 MED ORDER — LISINOPRIL 10 MG PO TABS
10.0000 mg | ORAL_TABLET | Freq: Every day | ORAL | Status: DC
  Administered 2022-11-23: 10 mg via ORAL
  Filled 2022-11-23: qty 1

## 2022-11-23 MED ORDER — MORPHINE SULFATE (PF) 2 MG/ML IV SOLN: 2.0000 mg | INTRAVENOUS | Status: DC | PRN

## 2022-11-23 NOTE — Assessment & Plan Note (Signed)
-   Holding glimepiride - Monitor CBGs, sliding scale coverage - Check hemoglobin A1c in the a.m. - Initial glucose was controlled at 152

## 2022-11-23 NOTE — ED Triage Notes (Signed)
Pt complaining of chest pain in the center of his chest that started this am, has gone away for the most part but it still feels like his heart is racing and he feels like he is going to pass out.

## 2022-11-23 NOTE — Assessment & Plan Note (Signed)
Continue statin. 

## 2022-11-23 NOTE — Assessment & Plan Note (Signed)
Continue lisinopril

## 2022-11-23 NOTE — Assessment & Plan Note (Addendum)
-   Chest pain with atypical features - Occurred at rest, burning sensation at the midclavicular line on the left - Continue aspirin, Plavix, ACE, statin - Cardiology consulted from ER and intends to see patient in the a.m. - EKG is without acute ischemic changes, working on getting an EKG to crossover into the electronic chart - Troponin flat 6, 7 - Monitor on telemetry

## 2022-11-23 NOTE — ED Provider Notes (Signed)
Care of patient handed off to me at this time by Evlyn Courier, PA-C.  Briefly, 82 year old male presenting to the emergency department with chest pain.  History of CAD.  Pending admission following a delta troponin. Physical Exam  BP (!) 157/68   Pulse (!) 57   Temp 98.4 F (36.9 C) (Oral)   Resp 16   Ht '5\' 10"'$  (1.778 m)   Wt 83.9 kg   SpO2 99%   BMI 26.54 kg/m   Physical Exam Vitals and nursing note reviewed.  HENT:     Head: Normocephalic and atraumatic.  Eyes:     General: No scleral icterus. Pulmonary:     Effort: Pulmonary effort is normal. No respiratory distress.  Skin:    Findings: No rash.  Neurological:     General: No focal deficit present.     Mental Status: He is alert.  Psychiatric:        Mood and Affect: Mood normal.        Behavior: Behavior normal.        Thought Content: Thought content normal.        Judgment: Judgment normal.     Procedures  Procedures  ED Course / MDM    Medical Decision Making Amount and/or Complexity of Data Reviewed Labs: ordered. Radiology: ordered.  Risk OTC drugs. Decision regarding hospitalization.   Cardiac workup here, EKG without significant changes.  Troponin x 2 is negative.  BNP is negative.  Loletha Carrow, PA-C consulted Dr. Gardiner Rhyme with cardiology who recommended admission for observation and will see him tomorrow for consult.  I consulted and spoke with Dr. Clearence Ped, hospitalist, who agrees to admit the patient.        Mickie Hillier, PA-C 11/23/22 Patrecia Pace, MD 11/23/22 [phone]

## 2022-11-23 NOTE — H&P (Signed)
History and Physical    Patient: Shawn Lopez [id number] DOB: 1941-01-14 DOA: 11/23/2022 DOS: the patient was seen and examined on 11/23/2022 PCP: Lemmie Evens, MD  Patient coming from: Home  Chief Complaint:  Chief Complaint  Patient presents with   Chest Pain   HPI: Shawn Lopez is a 82 y.o. male with medical history significant of atrial fibrillation not on anticoagulation or any medication for rate control, coronary artery disease, hypertension, hyperlipidemia, type 2 diabetes mellitus, presents to the ED with a chief complaint of dizziness.  Patient reports he had 3 episodes of dizziness and palpitations today.  They each lasted 30-40 minutes.  His blood pressure also spiked during these episodes.  He was sitting down when they happened.  Patient had an associated burning in his chest at the midclavicular line on the left.  It is worth noting that in the cardiology notes patient was not able to tolerate Imdur, but felt that his chest pain was better after he was started on an antacid by his PCP.  Patient reports that the pain does not radiate.  He does have soreness in his shoulders bilaterally, and this has been going on for couple of months.  It is also noted in the cards note that he has muscle spasm in his shoulders.  The pain happens at night and seems to be associated with when he feels cold.  Lastly with regards to today, he reports that he had abnormal peripheral edema last night.  He reports his legs have swelled before, but last night they swelled more than normal.  It was gone by morning.  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  He had stents placed at that time and he is on aspirin and Plavix.  Patient does not remember with MI felt like years ago, so he does not know if this is similar.  He does report that his troponin had been normal at first and then had an increase on the fourth draw.  However that is not consistent with the chart.  Patient has no other  complaints at this time.  Patient does not smoke, does not drink, does not use illicit drugs.  He is vaccinated for COVID he is not sure about flu.  He is full code. Review of Systems: As mentioned in the history of present illness. All other systems reviewed and are negative. Past Medical History:  Diagnosis Date   Arthritis    Atrial fibrillation ([place]) 2006   a. in 2006; not on anticoagulation, no recent atrial fib seen as of 2018.   Borderline hypertension    CAD (coronary artery disease)    a. NSTEMI 12/2016: cath showing 95% mid-LAD stenosis, 75% prox-LAD stenosis and 75% 1st Diag stenosis. DES to the proximal and mid-LAD along with the 1st Diagonal..   Chronic back pain    Prior trauma with vertebral fracture   Hyperlipidemia    Hypertension    Osteopenia    Sinus bradycardia    Type 2 diabetes mellitus ([place])    Past Surgical History:  Procedure Laterality Date   [place] EXTRACTION  2012   right eye,lens implantation   CHOLECYSTECTOMY     CIRCUMCISION  2010   meatotomy and dilatation   COLONOSCOPY N/A 01/14/2013   Procedure: COLONOSCOPY;  Surgeon: Jamesetta So, MD;  [facility];  Service: Gastroenterology;  Laterality: N/A;   CORONARY ANGIOGRAPHY N/A 01/15/2017   Procedure: Coronary Angiography;  Surgeon: Lorretta Harp, MD;  Location: [facility];  Service: Cardiovascular;  Laterality: N/A;   CORONARY STENT INTERVENTION N/A 01/15/2017   Procedure: Coronary Stent Intervention;  Surgeon: Lorretta Harp, MD;  Location: [facility];  Service: Cardiovascular;  Laterality: N/A;   INTRAVASCULAR PRESSURE WIRE/FFR STUDY N/A 04/04/2019   Procedure: INTRAVASCULAR PRESSURE WIRE/FFR STUDY;  Surgeon: Nelva Bush, MD;  Location: [facility];  Service: Cardiovascular;  Laterality: N/A;   LEFT HEART CATH AND CORONARY ANGIOGRAPHY N/A 04/04/2019   Procedure: LEFT HEART CATH AND CORONARY ANGIOGRAPHY;  Surgeon: Nelva Bush, MD;   Location: [facility];  Service: Cardiovascular;  Laterality: N/A;   Social History:  reports that he quit smoking about 25 years ago. His smoking use included cigarettes. He has a 22.50 pack-year smoking history. He has never used smokeless tobacco. He reports that he does not drink alcohol and does not use drugs.  Allergies  Allergen Reactions   Brilinta [Ticagrelor]     Dyspnea     Family History  Problem Relation Age of Onset   CAD Father    Heart attack Brother     Prior to Admission medications   Medication Sig Start Date End Date Taking? Authorizing Provider  aspirin EC 81 MG EC tablet Take 1 tablet (81 mg total) by mouth daily. 01/17/17  Yes Ghimire, Henreitta Leber, MD  atorvastatin (LIPITOR) 80 MG tablet Take 1 tablet (80 mg total) by mouth daily at 6 PM. 03/09/17  Yes Lendon Colonel, NP  clopidogrel (PLAVIX) 75 MG tablet Take 1 tablet (75 mg total) by mouth daily. 03/09/17  Yes Lendon Colonel, NP  glimepiride (AMARYL) 2 MG tablet Take 1 mg by mouth daily. 10/13/21  Yes [provider]  HYDROcodone-acetaminophen (NORCO) 10-325 MG tablet Take 1 tablet by mouth every 4 (four) hours as needed for moderate pain or severe pain. 09/27/22  Yes [provider]  lisinopril (ZESTRIL) 10 MG tablet Take 10 mg by mouth daily. 10/05/22  Yes [provider]  nitroGLYCERIN (NITROSTAT) 0.4 MG SL tablet Place 1 tablet (0.4 mg total) under the tongue every 5 (five) minutes as needed for chest pain. For 3 doses only 01/16/17  Yes Ghimire, Henreitta Leber, MD  pantoprazole (PROTONIX) 40 MG tablet Take 40 mg by mouth daily. 10/20/22  Yes [provider]    Physical Exam: Vitals:   11/23/22 1638 11/23/22 1640 11/23/22 1642 11/23/22 1804  BP:   (!) 180/72 (!) 157/68  Pulse:   60 (!) 57  Resp:   20 16  Temp:   98.4 F (36.9 C)   TempSrc:   Oral   SpO2: 99%  97% 99%  Weight:  83.9 kg    Height:  '5\' 10"'$  (1.778 m)     1.  General: Patient lying supine in bed,   no acute distress   2. Psychiatric: Alert and oriented x 3, mood and behavior normal for situation, pleasant and cooperative with exam   3. Neurologic: Speech and language are normal, face is symmetric, moves all 4 extremities voluntarily, at baseline without acute deficits on limited exam   4. HEENMT:  Head is atraumatic, normocephalic, pupils reactive to light, neck is supple, trachea is midline, mucous membranes are moist   5. Respiratory : Lungs are clear to auscultation bilaterally without wheezing, rhonchi, rales, no cyanosis, no increase in work of breathing or accessory muscle use   6. Cardiovascular : Heart rate normal, rhythm is  regular, no murmurs, rubs or gallops, no peripheral edema, peripheral pulses palpated   7. Gastrointestinal:  Abdomen is soft, nondistended, nontender to palpation bowel sounds active, no masses or organomegaly palpated   8. Skin:  Skin is warm, dry and intact without rashes, acute lesions, or ulcers on limited exam   9.Musculoskeletal:  No acute deformities or trauma, no asymmetry in tone, no peripheral edema, peripheral pulses palpated, no tenderness to palpation in the extremities  Data Reviewed: In the ED Temp 98.4, heart rate 57, respiratory rate 16-20, blood pressure 157/68-180/72, satting at 99% No leukocytosis with a white blood cell count of 7.3, hemoglobin stable at 14.9 Chemistry is unremarkable Troponin 6, 7 UA is not indicative of UTI Chest x-ray shows no active cardiopulmonary disease EKG is without acute ischemic changes, working on getting an EKG in the electronic chart Aspirin 325 given in the ED Cardiology was consulted and recommended admission for observation and they will see in the a.m.  Assessment and Plan: * Chest pain - Chest pain with atypical features - Occurred at rest, burning sensation at the midclavicular line on the left - Continue aspirin, Plavix, ACE, statin - Cardiology consulted from ER and intends to see  patient in the a.m. - EKG is without acute ischemic changes, working on getting an EKG to crossover into the electronic chart - Troponin flat 6, 7 - Monitor on telemetry  CAD (coronary artery disease) with PCI 12/2016 - Continue aspirin and Plavix - Continue ACE and statin - Patient is not on a beta-blocker for unknown reason - EKG is without acute ischemic changes - Cardiology was consulted from the ER and intends to see patient in the a.m. - Monitor on telemetry   Diabetes mellitus (HCC) - Holding glimepiride - Monitor CBGs, sliding scale coverage - Check hemoglobin A1c in the a.m. - Initial glucose was controlled at 152  Hyperlipidemia LDL goal <70 - Continue statin  Essential hypertension Continue lisinopril      Advance Care Planning:   Code Status: Full Code  Consults: Cardiology  Family Communication: Grandson at bedside  Severity of Illness: The appropriate patient status for this patient is OBSERVATION. Observation status is judged to be reasonable and necessary in order to provide the required intensity of service to ensure the patient's safety. The patient's presenting symptoms, physical exam findings, and initial radiographic and laboratory data in the context of their medical condition is felt to place them at decreased risk for further clinical deterioration. Furthermore, it is anticipated that the patient will be medically stable for discharge from the hospital within 2 midnights of admission.   Author: Rolla Plate, DO 11/23/2022 8:41 PM  For on call review [email or web address].

## 2022-11-23 NOTE — Assessment & Plan Note (Signed)
-   Continue aspirin and Plavix - Continue ACE and statin - Patient is not on a beta-blocker for unknown reason - EKG is without acute ischemic changes - Cardiology was consulted from the ER and intends to see patient in the a.m. - Monitor on telemetry

## 2022-11-23 NOTE — ED Provider Notes (Addendum)
[facility] Provider Note   CSN: [id number] Arrival date & time: 11/23/22  1634     History  Chief Complaint  Patient presents with   Chest Pain    Shawn Lopez is a 82 y.o. male.  82 year old male with past medical history significant for CAD status post 4 PCIs who presents today for evaluation of episodes of lightheadedness, diaphoresis, chest discomfort that started around 2 PM and has been intermittent since then.  Currently denies any chest discomfort.  States he has not taken his home baby aspirin for the past week as he has run out.  States around 330 he took a dose of lisinopril.  He states he was told to keep this on hand for periods when he felt anxious like he did earlier today.  He states this was formally discontinued as a continuous medicine about a year ago.  He states when he was discovered to have his heart attack in the past he had a similar episode with the lightheadedness.  The history is provided by the patient. No language interpreter was used.       Home Medications Prior to Admission medications   Medication Sig Start Date End Date Taking? Authorizing Provider  aspirin EC 81 MG EC tablet Take 1 tablet (81 mg total) by mouth daily. 01/17/17   Ghimire, Henreitta Leber, MD  atorvastatin (LIPITOR) 80 MG tablet Take 1 tablet (80 mg total) by mouth daily at 6 PM. 03/09/17   Lendon Colonel, NP  clopidogrel (PLAVIX) 75 MG tablet Take 1 tablet (75 mg total) by mouth daily. 03/09/17   Lendon Colonel, NP  glimepiride (AMARYL) 2 MG tablet Take 1 mg by mouth daily. 10/13/21   [provider]  HYDROcodone-acetaminophen (NORCO) 10-325 MG tablet Take 1 tablet by mouth every 4 (four) hours as needed for moderate pain or severe pain. 09/27/22   [provider]  nitroGLYCERIN (NITROSTAT) 0.4 MG SL tablet Place 1 tablet (0.4 mg total) under the tongue every 5 (five) minutes as needed for chest pain. For 3 doses  only 01/16/17   Ghimire, Henreitta Leber, MD      Allergies    Brilinta [ticagrelor]    Review of Systems   Review of Systems  Constitutional:  Positive for diaphoresis. Negative for chills and fever.  Respiratory:  Positive for shortness of breath. Negative for cough.   Cardiovascular:  Positive for chest pain. Negative for palpitations and leg swelling.  Gastrointestinal:  Negative for abdominal pain, nausea and vomiting.  Genitourinary:  Negative for dysuria.  Neurological:  Positive for light-headedness.  All other systems reviewed and are negative.   Physical Exam Updated Vital Signs BP (!) 180/72   Pulse 60   Temp 98.4 F (36.9 C) (Oral)   Resp 20   Ht '5\' 10"'$  (1.778 m)   Wt 83.9 kg   SpO2 97%   BMI 26.54 kg/m  Physical Exam Vitals and nursing note reviewed.  Constitutional:      General: He is not in acute distress.    Appearance: Normal appearance. He is not ill-appearing.  HENT:     Head: Normocephalic and atraumatic.     Nose: Nose normal.  Eyes:     General: No scleral icterus.    Extraocular Movements: Extraocular movements intact.     Conjunctiva/sclera: Conjunctivae normal.  Cardiovascular:     Rate and Rhythm: Normal rate and regular rhythm.     Pulses: Normal pulses.  Pulmonary:     Effort: Pulmonary effort is normal. No respiratory distress.     Breath sounds: Normal breath sounds. No wheezing or rales.  Abdominal:     General: There is no distension.     Palpations: Abdomen is soft.     Tenderness: There is no abdominal tenderness.  Musculoskeletal:        General: Normal range of motion.     Cervical back: Normal range of motion.  Skin:    General: Skin is warm and dry.  Neurological:     General: No focal deficit present.     Mental Status: He is alert. Mental status is at baseline.     ED Results / Procedures / Treatments   Labs (all labs ordered are listed, but only abnormal results are displayed) Labs Reviewed  BASIC METABOLIC PANEL   BRAIN NATRIURETIC PEPTIDE  CBC WITH DIFFERENTIAL/PLATELET  MAGNESIUM  TROPONIN I (HIGH SENSITIVITY)    EKG None  Radiology No results found.  Procedures Procedures    Medications Ordered in ED Medications  aspirin tablet 325 mg (has no administration in time range)    ED Course/ Medical Decision Making/ A&P                             Medical Decision Making Amount and/or Complexity of Data Reviewed Labs: ordered. Radiology: ordered.  Risk OTC drugs. Decision regarding hospitalization.   Medical Decision Making / ED Course   This patient presents to the ED for concern of chest pain, this involves an extensive number of treatment options, and is a complaint that carries with it a high risk of complications and morbidity.  The differential diagnosis includes ACS, PE, pneumonia, GERD, MSK etiology  MDM: 82 year old male with past medical history significant for CAD status post PCI presents today for evaluation of lightheadedness, chest discomfort.  Started at 2 PM.  Has had multiple episodes lasting about 10 minutes.  Remained asymptomatic throughout the ED stay.  Aspirin load given.  Initial workup included CBC which is unremarkable.  BMP with glucose 152 otherwise unremarkable.  Magnesium 2.0.  Initial troponin of 6.  BNP within normal limits.  Elevated heart score of 6.  Discussed with on-call cardiologist Dr. Gardiner Rhyme who recommend repeat [place] and if remains flat can be admitted to [facility] for observation and cardiology consult tomorrow, if significantly elevated will need to be transferred to [facility].  EKG without acute ischemic changes.  EKG did not crossover.  Requested repeat.  Patient at the end of my shift awaiting repeat troponin.  Signed out to oncoming provider Lauren PA-C.    Lab Tests: -I ordered, reviewed, and interpreted labs.   The pertinent results include:   Labs Reviewed  BASIC METABOLIC PANEL - Abnormal; Notable for the following components:       Result Value   Glucose, Bld 152 (*)    Calcium 8.8 (*)    All other components within normal limits  BRAIN NATRIURETIC PEPTIDE  CBC WITH DIFFERENTIAL/PLATELET  MAGNESIUM  TROPONIN I (HIGH SENSITIVITY)  TROPONIN I (HIGH SENSITIVITY)      EKG  EKG Interpretation  Date/Time:    Ventricular Rate:    PR Interval:    QRS Duration:   QT Interval:    QTC Calculation:   R Axis:     Text Interpretation:           Imaging Studies ordered: I ordered imaging studies including  chest x-ray I independently visualized and interpreted imaging. I agree with the radiologist interpretation   Medicines ordered and prescription drug management: Meds ordered this encounter  Medications   aspirin tablet 325 mg    -I have reviewed the patients home medicines and have made adjustments as needed   Cardiac Monitoring: The patient was maintained on a cardiac monitor.  I personally viewed and interpreted the cardiac monitored which showed an underlying rhythm of: Normal sinus rhythm to sinus bradycardia  Reevaluation: After the interventions noted above, I reevaluated the patient and found that they have :stayed the same Remained asymptomatic throughout the ED stay  Co morbidities that complicate the patient evaluation  Past Medical History:  Diagnosis Date   Arthritis    Atrial fibrillation ([place]) 2006   a. in 2006; not on anticoagulation, no recent atrial fib seen as of 2018.   Borderline hypertension    CAD (coronary artery disease)    a. NSTEMI 12/2016: cath showing 95% mid-LAD stenosis, 75% prox-LAD stenosis and 75% 1st Diag stenosis. DES to the proximal and mid-LAD along with the 1st Diagonal..   Chronic back pain    Prior trauma with vertebral fracture   Hyperlipidemia    Hypertension    Osteopenia    Sinus bradycardia    Type 2 diabetes mellitus ([place])       Dispostion: Patient signed out to oncoming provider for follow-up on troponin  Final Clinical Impression(s) /  ED Diagnoses Final diagnoses:  Chest pain, unspecified type  Precordial chest pain    Rx / DC Orders ED Discharge Orders     None         Evlyn Courier, PA-C 11/23/22 1856    Evlyn Courier, PA-C 11/23/22 1904    Davonna Belling, MD 11/23/22 2253

## 2022-11-24 ENCOUNTER — Other Ambulatory Visit (HOSPITAL_COMMUNITY): Payer: Self-pay

## 2022-11-24 ENCOUNTER — Observation Stay (HOSPITAL_BASED_OUTPATIENT_CLINIC_OR_DEPARTMENT_OTHER): Payer: Medicare HMO

## 2022-11-24 ENCOUNTER — Encounter (HOSPITAL_COMMUNITY): Payer: Self-pay | Admitting: Family Medicine

## 2022-11-24 DIAGNOSIS — R079 Chest pain, unspecified: Secondary | ICD-10-CM

## 2022-11-24 DIAGNOSIS — R0789 Other chest pain: Secondary | ICD-10-CM | POA: Diagnosis not present

## 2022-11-24 DIAGNOSIS — I10 Essential (primary) hypertension: Secondary | ICD-10-CM | POA: Diagnosis not present

## 2022-11-24 DIAGNOSIS — E785 Hyperlipidemia, unspecified: Secondary | ICD-10-CM | POA: Diagnosis not present

## 2022-11-24 LAB — COMPREHENSIVE METABOLIC PANEL
ALT: 28 U/L (ref 0–44)
AST: 23 U/L (ref 15–41)
Albumin: 3.4 g/dL — ABNORMAL LOW (ref 3.5–5.0)
Alkaline Phosphatase: 91 U/L (ref 38–126)
Anion gap: 6 (ref 5–15)
BUN: 12 mg/dL (ref 8–23)
CO2: 26 mmol/L (ref 22–32)
Calcium: 8.3 mg/dL — ABNORMAL LOW (ref 8.9–10.3)
Chloride: 104 mmol/L (ref 98–111)
Creatinine, Ser: 0.82 mg/dL (ref 0.61–1.24)
GFR, Estimated: >60 mL/min (ref >60)
Glucose, Bld: 122 mg/dL — ABNORMAL HIGH (ref 70–99)
Potassium: 3.7 mmol/L (ref 3.5–5.1)
Sodium: 136 mmol/L (ref 135–145)
Total Bilirubin: 1.1 mg/dL (ref 0.3–1.2)
Total Protein: 6.2 g/dL — ABNORMAL LOW (ref 6.5–8.1)

## 2022-11-24 LAB — CBC WITH DIFFERENTIAL/PLATELET
Abs Immature Granulocytes: 0.03 10*3/uL (ref 0.00–0.07)
Basophils Absolute: 0.1 10*3/uL (ref 0.0–0.1)
Basophils Relative: 1 %
Eosinophils Absolute: 0.3 10*3/uL (ref 0.0–0.5)
Eosinophils Relative: 4 %
HCT: 41.5 % (ref 39.0–52.0)
Hemoglobin: 13.7 g/dL (ref 13.0–17.0)
Immature Granulocytes: 0 %
Lymphocytes Relative: 25 %
Lymphs Abs: 1.8 10*3/uL (ref 0.7–4.0)
MCH: 30.4 pg (ref 26.0–34.0)
MCHC: 33 g/dL (ref 30.0–36.0)
MCV: 92.2 fL (ref 80.0–100.0)
Monocytes Absolute: 0.8 10*3/uL (ref 0.1–1.0)
Monocytes Relative: 11 %
Neutro Abs: 4.3 10*3/uL (ref 1.7–7.7)
Neutrophils Relative %: 59 %
Platelets: 176 10*3/uL (ref 150–400)
RBC: 4.5 MIL/uL (ref 4.22–5.81)
RDW: 13.2 % (ref 11.5–15.5)
WBC: 7.3 10*3/uL (ref 4.0–10.5)
nRBC: 0 % (ref 0.0–0.2)

## 2022-11-24 LAB — LIPID PANEL
Cholesterol: 108 mg/dL (ref 0–200)
HDL: 38 mg/dL — ABNORMAL LOW (ref >40)
LDL Cholesterol: 56 mg/dL (ref 0–99)
Total CHOL/HDL Ratio: 2.8 RATIO
Triglycerides: 68 mg/dL (ref <150)
VLDL: 14 mg/dL (ref 0–40)

## 2022-11-24 LAB — NM MYOCAR MULTI W/SPECT W/WALL MOTION / EF
LV dias vol: 94 mL (ref 62–150)
LV sys vol: 34 mL
Nuc Stress EF: 64 %
Peak HR: 97 {beats}/min
RATE: 0.4
Rest HR: 49 {beats}/min
Rest Nuclear Isotope Dose: 10.2 mCi
SDS: 2
SRS: 0
SSS: 2
ST Depression (mm): 0 mm
Stress Nuclear Isotope Dose: 30.2 mCi
TID: 1.02

## 2022-11-24 LAB — GLUCOSE, CAPILLARY
Glucose-Capillary: 128 mg/dL — ABNORMAL HIGH (ref 70–99)
Glucose-Capillary: 142 mg/dL — ABNORMAL HIGH (ref 70–99)

## 2022-11-24 LAB — MAGNESIUM: Magnesium: 2 mg/dL (ref 1.7–2.4)

## 2022-11-24 LAB — TROPONIN I (HIGH SENSITIVITY): Troponin I (High Sensitivity): 9 ng/L (ref <18)

## 2022-11-24 MED ORDER — TECHNETIUM TC 99M TETROFOSMIN IV KIT
30.0000 | PACK | Freq: Once | INTRAVENOUS | Status: AC | PRN
  Administered 2022-11-24: 30.2 via INTRAVENOUS

## 2022-11-24 MED ORDER — REGADENOSON 0.4 MG/5ML IV SOLN
INTRAVENOUS | Status: AC
  Administered 2022-11-24: 0.4 mg via INTRAVENOUS
  Filled 2022-11-24: qty 5

## 2022-11-24 MED ORDER — AMLODIPINE BESYLATE 2.5 MG PO TABS: 2.5000 mg | ORAL_TABLET | Freq: Every day | ORAL | 11 refills | Status: DC

## 2022-11-24 MED ORDER — LISINOPRIL 10 MG PO TABS
20.0000 mg | ORAL_TABLET | Freq: Every day | ORAL | Status: DC
  Administered 2022-11-24: 20 mg via ORAL
  Filled 2022-11-24: qty 2

## 2022-11-24 MED ORDER — ALUM & MAG HYDROXIDE-SIMETH 200-200-20 MG/5ML PO SUSP
30.0000 mL | Freq: Three times a day (TID) | ORAL | Status: DC
  Administered 2022-11-24: 30 mL via ORAL
  Filled 2022-11-24: qty 30

## 2022-11-24 MED ORDER — SODIUM CHLORIDE FLUSH 0.9 % IV SOLN
INTRAVENOUS | Status: AC
  Administered 2022-11-24: 10 mL via INTRAVENOUS
  Filled 2022-11-24: qty 10

## 2022-11-24 MED ORDER — ALUM & MAG HYDROXIDE-SIMETH 200-200-20 MG/5ML PO SUSP: 30.0000 mL | Freq: Three times a day (TID) | ORAL | 0 refills | Status: DC

## 2022-11-24 MED ORDER — METOPROLOL TARTRATE 25 MG PO TABS: 12.5000 mg | ORAL_TABLET | Freq: Two times a day (BID) | ORAL | 2 refills | Status: DC

## 2022-11-24 MED ORDER — TECHNETIUM TC 99M TETROFOSMIN IV KIT
10.0000 | PACK | Freq: Once | INTRAVENOUS | Status: AC | PRN
  Administered 2022-11-24: 10.2 via INTRAVENOUS

## 2022-11-24 MED ORDER — AMLODIPINE BESYLATE 5 MG PO TABS
2.5000 mg | ORAL_TABLET | Freq: Every day | ORAL | Status: DC
  Administered 2022-11-24: 2.5 mg via ORAL
  Filled 2022-11-24: qty 1

## 2022-11-24 NOTE — Consult Note (Addendum)
Cardiology Consultation   Patient ID: [name] MRN: [id number]; DOB: 17-Aug-1941  Admit date: 11/23/2022 Date of Consult: 11/24/2022  PCP:  Lemmie Evens, [facility] Cardiologist:  Rozann Lesches, MD   {  Patient Profile:   Shawn Lopez is a 82 y.o. male with a hx of CAD s/p stenting LAD and OM1 in April 2018, patent stents by cath in July 2020, low risk NST in May 2022, palpitations (PACs and PVCs with SVT by prior monitor), hypertension, hyperlipidemia, type 2 diabetes who is being seen 11/24/2022 for the evaluation of chest pain at the request of Dr. Roger Shelter.  History of Present Illness:   Mr. Shawn Lopez, Dr. Anselmo Rod for the above cardiac issues.  Has a long history of angina.  Last year patient has had 2 ER visits for chest pain with negative workup.  Did not tolerate Imdur due to headache and fatigue.  He was last seen in February 2024 chest pain follow-up.  He reported some improvement in symptoms with the PPI.  He presented to the ER 12/22/2022 for chest pain. The patient reports left sided burning chest discomfort that began the day prior to presentation. He also reports chest flutter with the pain. No SOB, LLE, orthopnea or pnd.   In the ER blood pressure was 180/72, pulse 60, afebrile, respiratory rate 20, 97% O2.  Labs showed unremarkable CBC, blood glucose 52, mag 2, normal BMP.  High-sensitivity troponin 6, 7, 9.  EG showed no ischemic changes.  Cardiology was consulted and patient was admitted for further workup.  Past Medical History:  Diagnosis Date   Arthritis    Atrial fibrillation ([place]) 2006   a. in 2006; not on anticoagulation, no recent atrial fib seen as of 2018.   Borderline hypertension    CAD (coronary artery disease)    a. NSTEMI 12/2016: cath showing 95% mid-LAD stenosis, 75% prox-LAD stenosis and 75% 1st Diag stenosis. DES to the proximal and mid-LAD along with the 1st Diagonal..   Chronic back pain    Prior  trauma with vertebral fracture   Hyperlipidemia    Hypertension    Osteopenia    Sinus bradycardia    Type 2 diabetes mellitus ([place])     Past Surgical History:  Procedure Laterality Date   [place] EXTRACTION  2012   right eye,lens implantation   CHOLECYSTECTOMY     CIRCUMCISION  2010   meatotomy and dilatation   COLONOSCOPY N/A 01/14/2013   Procedure: COLONOSCOPY;  Surgeon: Jamesetta So, MD;  [facility];  Service: Gastroenterology;  Laterality: N/A;   CORONARY ANGIOGRAPHY N/A 01/15/2017   Procedure: Coronary Angiography;  Surgeon: Lorretta Harp, MD;  Location: [facility];  Service: Cardiovascular;  Laterality: N/A;   CORONARY STENT INTERVENTION N/A 01/15/2017   Procedure: Coronary Stent Intervention;  Surgeon: Lorretta Harp, MD;  Location: [facility];  Service: Cardiovascular;  Laterality: N/A;   INTRAVASCULAR PRESSURE WIRE/FFR STUDY N/A 04/04/2019   Procedure: INTRAVASCULAR PRESSURE WIRE/FFR STUDY;  Surgeon: Nelva Bush, MD;  Location: [facility];  Service: Cardiovascular;  Laterality: N/A;   LEFT HEART CATH AND CORONARY ANGIOGRAPHY N/A 04/04/2019   Procedure: LEFT HEART CATH AND CORONARY ANGIOGRAPHY;  Surgeon: Nelva Bush, MD;  Location: [facility];  Service: Cardiovascular;  Laterality: N/A;     Home Medications:  Prior to Admission medications   Medication Sig Start Date End Date Taking?  Authorizing Provider  aspirin EC 81 MG EC tablet Take 1 tablet (81 mg total) by mouth daily. 01/17/17  Yes Ghimire, Henreitta Leber, MD  atorvastatin (LIPITOR) 80 MG tablet Take 1 tablet (80 mg total) by mouth daily at 6 PM. 03/09/17  Yes Lendon Colonel, NP  clopidogrel (PLAVIX) 75 MG tablet Take 1 tablet (75 mg total) by mouth daily. 03/09/17  Yes Lendon Colonel, NP  glimepiride (AMARYL) 2 MG tablet Take 1 mg by mouth daily. 10/13/21  Yes [provider]  HYDROcodone-acetaminophen (NORCO) 10-325 MG tablet  Take 1 tablet by mouth every 4 (four) hours as needed for moderate pain or severe pain. 09/27/22  Yes [provider]  lisinopril (ZESTRIL) 10 MG tablet Take 10 mg by mouth daily. 10/05/22  Yes [provider]  nitroGLYCERIN (NITROSTAT) 0.4 MG SL tablet Place 1 tablet (0.4 mg total) under the tongue every 5 (five) minutes as needed for chest pain. For 3 doses only 01/16/17  Yes Ghimire, Henreitta Leber, MD  pantoprazole (PROTONIX) 40 MG tablet Take 40 mg by mouth daily. 10/20/22  Yes [provider]    Inpatient Medications: Scheduled Meds:  alum & mag hydroxide-simeth  30 mL Oral Q8H   aspirin EC  81 mg Oral Daily   atorvastatin  80 mg Oral q1800   clopidogrel  75 mg Oral Daily   heparin  5,000 Units Subcutaneous Q8H   insulin aspart  0-15 Units Subcutaneous TID WC   insulin aspart  0-5 Units Subcutaneous QHS   lisinopril  20 mg Oral Daily   pantoprazole  40 mg Oral Daily   Continuous Infusions:  PRN Meds: acetaminophen **OR** acetaminophen, morphine injection, ondansetron **OR** ondansetron (ZOFRAN) IV, oxyCODONE  Allergies:    Allergies  Allergen Reactions   Brilinta [Ticagrelor]     Dyspnea     Social History:   Social History   Socioeconomic History   Marital status: Married    Spouse name: Not on file   Number of children: 4   Years of education: Not on file   Highest education level: Not on file  Occupational History   Occupation: Architect    Comment: [employer]  Tobacco Use   Smoking status: Former    Packs/day: 1.50    Years: 15.00    Total pack years: 22.50    Types: Cigarettes    Quit date: 04/20/1997    Years since quitting: 25.6   Smokeless tobacco: Never  Vaping Use   Vaping Use: Never used  Substance and Sexual Activity   Alcohol use: No   Drug use: No   Sexual activity: Not on file  Other Topics Concern   Not on file  Social History Narrative   Not on file   Social Determinants of Health   Financial  Resource Strain: Not on file  Food Insecurity: Not on file  Transportation Needs: Not on file  Physical Activity: Not on file  Stress: Not on file  Social Connections: Not on file  Intimate Partner Violence: Not on file    Family History:    Family History  Problem Relation Age of Onset   CAD Father    Heart attack Brother      ROS:  Please see the history of present illness.   All other ROS reviewed and negative.     Physical Exam/Data:   Vitals:   11/24/22 0228 11/24/22 0400 11/24/22 0422 11/24/22 0727  BP:  (!) 143/69 (!) 149/69 121/83  Pulse: (!) 57 (!) 47 (!) 51 (!) 50  Resp: '15 15 20 18  '$ Temp:  98.2 F (36.8 C) 98.1 F (36.7 C) 98.2 F (36.8 C)  TempSrc:  Oral  Oral  SpO2: 96% 98% 97% 95%  Weight:      Height:        Intake/Output Summary (Last 24 hours) at 11/24/2022 0753 Last data filed at 11/23/2022 2308 Gross per 24 hour  Intake --  Output 350 ml  Net -350 ml      11/23/2022    4:40 PM 11/02/2022    2:46 PM 07/18/2022    2:46 PM  Last 3 Weights  Weight (lbs) 185 lb 187 lb 9.6 oz 188 lb  Weight (kg) 83.915 kg 85.095 kg 85.276 kg     Body mass index is 26.54 kg/m.  General:  Well nourished, well developed, in no acute distress HEENT: normal Neck: no JVD Vascular: No carotid bruits; Distal pulses 2+ bilaterally Cardiac:  normal S1, S2; bradycardia, RR; no murmur  Lungs:  clear to auscultation bilaterally, no wheezing, rhonchi or rales  Abd: soft, nontender, no hepatomegaly  Ext: no edema Musculoskeletal:  No deformities, BUE and BLE strength normal and equal Skin: warm and dry  Neuro:  CNs 2-12 intact, no focal abnormalities noted Psych:  Normal affect   EKG:  The EKG was personally reviewed and demonstrates:  NSR 71bpm, TWI III, aVF, PVCs Telemetry:  Telemetry was personally reviewed and demonstrates:  SB HR upper 40s and 50s  Relevant CV Studies:  Myoview Lexiscan 01/2021 Narrative & Impression  Blood pressure demonstrated a hypertensive  response to exercise. There was no ST segment deviation noted during stress. The study is normal. There are no perfusion defects This is a low risk study. The left ventricular ejection fraction is normal (55-65%). Not able to calculate Duke treadmill score, protocol held at stage 2 due to shortness of breath.     Echo 06/2020 1. Left ventricular ejection fraction, by estimation, is 55 to 60%. The  left ventricle has normal function. The left ventricle has no regional  wall motion abnormalities. There is mild left ventricular hypertrophy.  Left ventricular diastolic parameters  are indeterminate. Elevated left atrial pressure.   2. Right ventricular systolic function is normal. The right ventricular  size is normal.   3. Left atrial size was moderately dilated.   4. The mitral valve is normal in structure. Trivial mitral valve  regurgitation. No evidence of mitral stenosis.   5. The aortic valve is tricuspid. There is mild calcification of the  aortic valve. There is mild thickening of the aortic valve. Aortic valve  regurgitation is not visualized. No aortic stenosis is present.   6. The inferior vena cava is normal in size with greater than 50%  respiratory variability, suggesting right atrial pressure of 3 mmHg.   Myoview Lexiscan 06/2020 Narrative & Impression  Horizontal ST segment depression ST segment depression of 0.5 mm was noted during stress in the V5 and V6 leads. Nondiagnostic changes Findings consistent with prior basal inferior myocardial infarction with mild peri-infarct ischemia. This is a high risk study. Risk based on decreased LVEF, fairly mild current ischemia that alone would be considered low risk. Recommend correlating LVEF with echocardiogram. The left ventricular ejection fraction is severely decreased (<30%).   LHC 03/2019 Conclusions: Mild to moderate, non-obstructive coronary artery disease, as detailed below.  Long segment of three overlapping stents in  the proximal through distal LAD is patent with  up to 50% in-stent restenosis in the mid section that is not hemodynamically significant at this time with a DFR of 0.90-0.91 (cut point for significance is 0.89). Widely patent stent in D1 with 50% focal disease proximal to the stent, which is not hemodynamically significant (DFR 0.95). Normal left ventricular systolic function with mildly elevated filling pressure suggestive of diastolic dysfunction. Sinus bradycardia with heart rates of 40-50 bpm noted throughout most of the case.   Recommendations: Continue aggressive medical therapy, including indefinite dual antiplatelet therapy with aspirin and clopidogrel given long stented segment of the LAD. Question if chronotropic incompetence is contributing to symptoms; I will decrease metoprolol tartrate to 12.5 twice daily. Add amlodipine 2.5 mg daily for improved blood pressure control and antianginal therapy, as Mr. Anderer has been intolerant of isosorbide mononitrate.   Nelva Bush, MD [facility] Pager: [phone]  Laboratory Data:  High Sensitivity Troponin:   Recent Labs  Lab 11/23/22 1656 11/23/22 1844 11/24/22 0448  TROPONINIHS '6 7 9     '$ Chemistry Recent Labs  Lab 11/23/22 1656 11/24/22 0448  NA 135 136  K 3.8 3.7  CL 103 104  CO2 24 26  GLUCOSE 152* 122*  BUN 11 12  CREATININE 0.83 0.82  CALCIUM 8.8* 8.3*  MG 2.0 2.0  GFRNONAA >60 >60  ANIONGAP 8 6    Recent Labs  Lab 11/24/22 0448  PROT 6.2*  ALBUMIN 3.4*  AST 23  ALT 28  ALKPHOS 91  BILITOT 1.1   Lipids  Recent Labs  Lab 11/24/22 0710  CHOL 108  TRIG 68  HDL 38*  LDLCALC 56  CHOLHDL 2.8    Hematology Recent Labs  Lab 11/23/22 1656 11/24/22 0448  WBC 7.3 7.3  RBC 4.89 4.50  HGB 14.9 13.7  HCT 44.7 41.5  MCV 91.4 92.2  MCH 30.5 30.4  MCHC 33.3 33.0  RDW 13.1 13.2  PLT 173 176   Thyroid No results for input(s): "TSH", "FREET4" in the last 168 hours.  BNP Recent Labs  Lab  11/23/22 1656  BNP 39.0    DDimer No results for input(s): "DDIMER" in the last 168 hours.   Radiology/Studies:  DG Chest 2 View  Result Date: 11/23/2022 CLINICAL DATA:  Chest pain EXAM: CHEST - 2 VIEW COMPARISON:  Chest x-ray 07/03/2022 FINDINGS: The heart size and mediastinal contours are within normal limits. Both lungs are clear. The visualized skeletal structures are unremarkable. IMPRESSION: No active cardiopulmonary disease. Electronically Signed   By: Ronney Asters M.D.   On: 11/23/2022 17:13     Assessment and Plan:   Chest pain CAD s/p DES LAD and OM1 in 2018 - the patient presents with atypical chest pain that is chronic, previously improved with antiacid - HS troponin negative x 3 - EKG with no ischemic changes - Myoview lexiscan in 2021 and 2022 were both normal. Cath in 2020 showed patent stents - He did not tolerate Imdur in the past due to headaches - continue Aspirin, Plavix, statin - No BB with bradycardia - can consider Ranexa - I will order a Myoview lexiscan  DM2 - Per IM  HLD - LDL 56 - continue Lipitor '80mg'$  daily  HTN - PTA lisinopril '20mg'$  daily continued - BP is good  For questions or updates, please contact [place] Please consult [email or web address] for contact info under    Signed, Cadence Arlyss Repress  11/24/2022 7:53 AM   Attending note  Patient seen and discussed with PA Further, I  agree with her documentation. 82 yo male history of CAD with stents to LAD and OM 1 in 12/2016. July 2020 cath with patent vessels and stents, nuclear stress May 2022 low risk. HTN, HL, DM2 presented with chest pain. Left sided burning pain.    K 3.8 Cr 0.83 BUN 11 BNP 39 WBC 7.2 Hgb 14.9 Plt 173 Mg 2 LDL 56 Trop 6-->7-->9 CXR no acute process EKG SR, inferior Qwaves, PVC. NO acute ischemic changes  01/2021 nuclear stress: no EKG changes, no perfusion defects    1.CAD/chest pain - history of CAD with stents as reported above - long history of  chest pains, cath 2018, nuclear stress 2021 and 2022 low risk - trops negative this admission, EKG without acute ischemic changes   - 2 years since last ischemic evaluation, plan for myoview this admission - medical therapy with ASA 81, atorva 80, plavix 75, lisinopril 20.  - imdur caused headaches in the past. Low HRs avoid beta blocker. Can consider norvasc or ranexa as antianginal. From Dr McDowell's note consider norvasc if needed at last appt.    Carlyle Dolly MD

## 2022-11-24 NOTE — ED Notes (Signed)
Lynford Citizen, RN reported off to this nurse, reports head to toe admission assessment was completed and documented in flowsheets

## 2022-11-24 NOTE — Discharge Summary (Addendum)
Physician Discharge Summary   Patient: Shawn Lopez MRN: [id number] DOB: [birth year]  Admit date:     11/23/2022  Discharge date: 11/24/22  Discharge Physician: Deatra James   PCP: Lemmie Evens, MD   Recommendations at discharge:  Continue taking current recommended medications per cardiology -Continue taking PPI and Maalox  -Follow-up with a cardiologist in 2-4 weeks  Discharge Diagnoses: Principal Problem:   Chest pain Active Problems:   Essential hypertension   Hyperlipidemia LDL goal <70   Diabetes mellitus ([place])   CAD (coronary artery disease) with PCI 12/2016  Resolved Problems:   * No resolved hospital problems. [facility] Course:  Shawn Lopez is a 82 y.o. male with medical history significant of atrial fibrillation not on anticoagulation or any medication for rate control, coronary artery disease, hypertension, hyperlipidemia, type 2 diabetes mellitus, presents to the ED with a chief complaint of dizziness.   Patient reports he had 3 episodes of dizziness and palpitations today.  They each lasted 30-40 minutes.  His blood pressure also spiked during these episodes.  He was sitting down when they happened.  Patient had an associated burning in his chest at the midclavicular line on the left.  It is worth noting that in the cardiology notes patient was not able to tolerate Imdur, but felt that his chest pain was better after he was started on an antacid by his PCP.  Patient reports that the pain does not radiate.  He does have soreness in his shoulders bilaterally, and this has been going on for couple of months. He also reports that he had abnormal peripheral edema last night.  He reports his legs have swelled before, but last night they swelled more than normal.  It was gone by morning.   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  He had stents placed at that time and he is on aspirin and Plavix.    ED:  Temp 98.4, heart rate 57, respiratory rate  16-20, blood pressure 157/68-180/72, satting at 99% No leukocytosis WBC 7.3, hemoglobin stable at 14.9 Chemistry is unremarkable Troponin 6, 7 UA is not indicative of UTI Chest x-ray shows no active cardiopulmonary disease EKG is without acute ischemic changes, working on getting an EKG in the electronic chart Aspirin 325 given in the ED Cardiology was consulted and recommended admission for observation   * Chest pain - Atypical chest burning - resolved -Troponin remained flat 6, 7, 7, - Occurred at rest, burning sensation at the midclavicular line on the left - Continue aspirin, Plavix, ACE, statin - Cardiology consulted -patient been evaluated by Dr. Harl Bowie, recommended continue current medication with changes of metoprolol 12.5 mg p.o. twice daily, Norvasc 2.5 mg daily  - EKG is without acute ischemic changes - November 24 2022 - status post Lexiscan stress test per cardiology within normal limits cleared for discharge  CAD (coronary artery disease) with PCI 12/2016 - Continue aspirin and Plavix - Continue ACE and statin - Metoprolol 12.5 mg twice daily, Norvasc 2.5 mg daily-added - EKG is without acute ischemic changes    Diabetes mellitus ([place]) - Resume glimepiride   Hyperlipidemia LDL goal <70 - Continue statin  Essential hypertension Continue lisinopril  Disposition: Home Diet recommendation:  Discharge Diet Orders (From admission, onward)     Start     Ordered   11/24/22 0000  Diet - low sodium heart healthy        11/24/22 1035  Cardiac diet DISCHARGE MEDICATION: Allergies as of 11/24/2022       Reactions   Brilinta [ticagrelor]    Dyspnea         Medication List     TAKE these medications    alum & mag hydroxide-simeth 200-200-20 MG/5ML suspension Commonly known as: MAALOX/MYLANTA Take 30 mLs by mouth every 8 (eight) hours.   amLODipine 2.5 MG tablet Commonly known as: NORVASC Take 1 tablet (2.5 mg total) by mouth daily.    aspirin EC 81 MG tablet Take 1 tablet (81 mg total) by mouth daily.   atorvastatin 80 MG tablet Commonly known as: LIPITOR Take 1 tablet (80 mg total) by mouth daily at 6 PM.   clopidogrel 75 MG tablet Commonly known as: PLAVIX Take 1 tablet (75 mg total) by mouth daily.   glimepiride 2 MG tablet Commonly known as: AMARYL Take 1 mg by mouth daily.   HYDROcodone-acetaminophen 10-325 MG tablet Commonly known as: NORCO Take 1 tablet by mouth every 4 (four) hours as needed for moderate pain or severe pain.   lisinopril 10 MG tablet Commonly known as: ZESTRIL Take 10 mg by mouth daily.   metoprolol tartrate 25 MG tablet Commonly known as: LOPRESSOR Take 0.5 tablets (12.5 mg total) by mouth 2 (two) times daily.   nitroGLYCERIN 0.4 MG SL tablet Commonly known as: Nitrostat Place 1 tablet (0.4 mg total) under the tongue every 5 (five) minutes as needed for chest pain. For 3 doses only   pantoprazole 40 MG tablet Commonly known as: PROTONIX Take 40 mg by mouth daily.        Discharge Exam: Filed Weights   11/23/22 1640  Weight: 83.9 kg        General:  AAO x 3,  cooperative, no distress;   HEENT:  Normocephalic, PERRL, otherwise with in Normal limits   Neuro:  CNII-XII intact. , normal motor and sensation, reflexes intact   Lungs:   Clear to auscultation BL, Respirations unlabored,  No wheezes / crackles  Cardio:    S1/S2, RRR, No murmure, No Rubs or Gallops   Abdomen:  Soft, non-tender, bowel sounds active all four quadrants, no guarding or peritoneal signs.  Muscular  skeletal:  Limited exam -global generalized weaknesses - in bed, able to move all 4 extremities,   2+ pulses,  symmetric, No pitting edema  Skin:  Dry, warm to touch, negative for any Rashes,  Wounds: Please see nursing documentation          Condition at discharge: good  The results of significant diagnostics from this hospitalization (including imaging, microbiology, ancillary and  laboratory) are listed below for reference.   Imaging Studies: DG Chest 2 View  Result Date: 11/23/2022 CLINICAL DATA:  Chest pain EXAM: CHEST - 2 VIEW COMPARISON:  Chest x-ray 07/03/2022 FINDINGS: The heart size and mediastinal contours are within normal limits. Both lungs are clear. The visualized skeletal structures are unremarkable. IMPRESSION: No active cardiopulmonary disease. Electronically Signed   By: Ronney Asters M.D.   On: 11/23/2022 17:13    Microbiology: Results for orders placed or performed during the hospital encounter of 09/05/21  Covid-19, Flu A+B ([facility])     Status: Abnormal   Collection Time: 09/05/21  5:30 PM   Specimen: Nasopharyngeal Swab   Naso  Result Value Ref Range Status   SARS-CoV-2, NAA Detected (A) Not Detected Final    Comment: Patients who have a positive COVID-19 test result may now have treatment options. Treatment options are  available for patients with mild to moderate symptoms and for hospitalized patients. Visit our website at [email or web address] for resources and information.    Influenza A, NAA Not Detected Not Detected Final   Influenza B, NAA Not Detected Not Detected Final   Test Information: Comment  Final    Comment: This nucleic acid amplification test was developed and its performance characteristics determined by [employer]. Nucleic acid amplification tests include RT-PCR and TMA. This test has not been FDA cleared or approved. This test has been authorized by FDA under an Emergency Use Authorization (EUA). This test is only authorized for the duration of time the declaration that circumstances exist justifying the authorization of the emergency use of in vitro diagnostic tests for detection of SARS-CoV-2 virus and/or diagnosis of COVID-19 infection under section 564(b)(1) of the Act, 21 U.S.C. [id number]) (1), unless the authorization is terminated or revoked sooner. When diagnostic testing is negative, the  possibility of a false negative result should be considered in the context of a patient's recent exposures and the presence of clinical signs and symptoms consistent with COVID-19. An individual without symptoms of COVID-19 and who is not shedding SARS-CoV-2 virus wo uld expect to have a negative (not detected) result in this assay.     Labs: CBC: Recent Labs  Lab 11/23/22 1656 11/24/22 0448  WBC 7.3 7.3  NEUTROABS 4.6 4.3  HGB 14.9 13.7  HCT 44.7 41.5  MCV 91.4 92.2  PLT 173 0000000   Basic Metabolic Panel: Recent Labs  Lab 11/23/22 1656 11/24/22 0448  NA 135 136  K 3.8 3.7  CL 103 104  CO2 24 26  GLUCOSE 152* 122*  BUN 11 12  CREATININE 0.83 0.82  CALCIUM 8.8* 8.3*  MG 2.0 2.0   Liver Function Tests: Recent Labs  Lab 11/24/22 0448  AST 23  ALT 28  ALKPHOS 91  BILITOT 1.1  PROT 6.2*  ALBUMIN 3.4*   CBG: Recent Labs  Lab 11/23/22 2123 11/24/22 0728  GLUCAP 179* 128*    Discharge time spent: greater than 45  minutes.  Signed: Deatra James, MD [facility] 11/24/2022

## 2022-11-24 NOTE — Plan of Care (Signed)
  Problem: Education: Goal: Knowledge of General Education information will improve Description: Including pain rating scale, medication(s)/side effects and non-pharmacologic comfort measures 11/24/2022 1351 by Melony Overly, RN Outcome: Adequate for Discharge 11/24/2022 1149 by Melony Overly, RN Outcome: Progressing   Problem: Health Behavior/Discharge Planning: Goal: Ability to manage health-related needs will improve 11/24/2022 1351 by Melony Overly, RN Outcome: Adequate for Discharge 11/24/2022 1149 by Melony Overly, RN Outcome: Progressing   Problem: Clinical Measurements: Goal: Ability to maintain clinical measurements within normal limits will improve 11/24/2022 1351 by Melony Overly, RN Outcome: Adequate for Discharge 11/24/2022 1149 by Melony Overly, RN Outcome: Progressing Goal: Will remain free from infection 11/24/2022 1351 by Melony Overly, RN Outcome: Adequate for Discharge 11/24/2022 1149 by Melony Overly, RN Outcome: Progressing Goal: Diagnostic test results will improve 11/24/2022 1351 by Melony Overly, RN Outcome: Adequate for Discharge 11/24/2022 1149 by Melony Overly, RN Outcome: Progressing Goal: Respiratory complications will improve 11/24/2022 1351 by Melony Overly, RN Outcome: Adequate for Discharge 11/24/2022 1149 by Melony Overly, RN Outcome: Progressing Goal: Cardiovascular complication will be avoided 11/24/2022 1351 by Melony Overly, RN Outcome: Adequate for Discharge 11/24/2022 1149 by Melony Overly, RN Outcome: Progressing   Problem: Activity: Goal: Risk for activity intolerance will decrease 11/24/2022 1351 by Melony Overly, RN Outcome: Adequate for Discharge 11/24/2022 1149 by Melony Overly, RN Outcome: Progressing   Problem: Nutrition: Goal: Adequate nutrition will be maintained 11/24/2022 1351 by Melony Overly, RN Outcome: Adequate for Discharge 11/24/2022 1149 by Melony Overly, RN Outcome: Progressing   Problem:  Coping: Goal: Level of anxiety will decrease 11/24/2022 1351 by Melony Overly, RN Outcome: Adequate for Discharge 11/24/2022 1149 by Melony Overly, RN Outcome: Progressing   Problem: Elimination: Goal: Will not experience complications related to bowel motility 11/24/2022 1351 by Melony Overly, RN Outcome: Adequate for Discharge 11/24/2022 1149 by Melony Overly, RN Outcome: Progressing Goal: Will not experience complications related to urinary retention 11/24/2022 1351 by Melony Overly, RN Outcome: Adequate for Discharge 11/24/2022 1149 by Melony Overly, RN Outcome: Progressing   Problem: Pain Managment: Goal: General experience of comfort will improve 11/24/2022 1351 by Melony Overly, RN Outcome: Adequate for Discharge 11/24/2022 1149 by Melony Overly, RN Outcome: Progressing   Problem: Safety: Goal: Ability to remain free from injury will improve 11/24/2022 1351 by Melony Overly, RN Outcome: Adequate for Discharge 11/24/2022 1149 by Melony Overly, RN Outcome: Progressing   Problem: Skin Integrity: Goal: Risk for impaired skin integrity will decrease 11/24/2022 1351 by Melony Overly, RN Outcome: Adequate for Discharge 11/24/2022 1149 by Melony Overly, RN Outcome: Progressing

## 2022-11-24 NOTE — Care Management Obs Status (Signed)
[place] NOTIFICATION   Patient Details  Name: Shawn Lopez MRN: [id number] Date of Birth: 1941-07-04   Medicare Observation Status Notification Given:  Yes    Tommy Medal 11/24/2022, 2:18 PM

## 2022-11-24 NOTE — Evaluation (Signed)
Physical Therapy Evaluation Patient Details Name: Shawn Lopez MRN: [id number] DOB: Jul 28, 1941 Today's Date: 11/24/2022  History of Present Illness  Shawn Lopez is a 82 y.o. male with medical history significant of atrial fibrillation not on anticoagulation or any medication for rate control, coronary artery disease, hypertension, hyperlipidemia, type 2 diabetes mellitus, presents to the ED with a chief complaint of dizziness.  Patient reports he had 3 episodes of dizziness and palpitations today.  They each lasted 30-40 minutes.  His blood pressure also spiked during these episodes.  He was sitting down when they happened.  Patient had an associated burning in his chest at the midclavicular line on the left.  It is worth noting that in the cardiology notes patient was not able to tolerate Imdur, but felt that his chest pain was better after he was started on an antacid by his PCP.  Patient reports that the pain does not radiate.  He does have soreness in his shoulders bilaterally, and this has been going on for couple of months.  It is also noted in the cards note that he has muscle spasm in his shoulders.  The pain happens at night and seems to be associated with when he feels cold.  Lastly with regards to today, he reports that he had abnormal peripheral edema last night.  He reports his legs have swelled before, but last night they swelled more than normal.  It was gone by morning.  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  He had stents placed at that time and he is on aspirin and Plavix.  Patient does not remember with MI felt like years ago, so he does not know if this is similar.  He does report that his troponin had been normal at first and then had an increase on the fourth draw.  However that is not consistent with the chart.  Patient has no other complaints at this time.   Clinical Impression  Patient functioning at baseline for functional mobility and gait demonstrating good  return for bed mobility, transfers and ambulation in hallways without loss of balance.  Plan:  Patient discharged from physical therapy to care of nursing for ambulation daily as tolerated for length of stay.         Recommendations for follow up therapy are one component of a multi-disciplinary discharge planning process, led by the attending physician.  Recommendations may be updated based on patient status, additional functional criteria and insurance authorization.  Follow Up Recommendations No PT follow up      Assistance Recommended at Discharge PRN  Patient can return home with the following  Other (comment) (at baseline)    Equipment Recommendations None recommended by PT  Recommendations for Other Services       Functional Status Assessment Patient has not had a recent decline in their functional status     Precautions / Restrictions Precautions Precautions: None Restrictions Weight Bearing Restrictions: No      Mobility  Bed Mobility Overal bed mobility: Independent                  Transfers Overall transfer level: Independent                      Ambulation/Gait Ambulation/Gait assistance: Independent Gait Distance (Feet): 150 Feet Assistive device: None Gait Pattern/deviations: WFL(Within Functional Limits) Gait velocity: normal     General Gait Details: demonstrates good return for ambulating in room/hallways without loss of  balance and no need for an AD  Stairs            Wheelchair Mobility    Modified Rankin (Stroke Patients Only)       Balance Overall balance assessment: Independent                                           Pertinent Vitals/Pain Pain Assessment Pain Assessment: No/denies pain    Home Living Family/patient expects to be discharged to:: Private residence Living Arrangements: Spouse/significant other Available Help at Discharge: Family Type of Home: House Home Access: Stairs  to enter Entrance Stairs-Rails: Left Entrance Stairs-Number of Steps: 5   Home Layout: Two level Home Equipment: None      Prior Function Prior Level of Function : Independent/Modified Independent             Mobility Comments: Hydrographic surveyor, drives, works ADLs Comments: Independent     Hand Dominance   Dominant Hand: Right    Extremity/Trunk Assessment   Upper Extremity Assessment Upper Extremity Assessment: Overall WFL for tasks assessed    Lower Extremity Assessment Lower Extremity Assessment: Overall WFL for tasks assessed    Cervical / Trunk Assessment Cervical / Trunk Assessment: Normal  Communication   Communication: No difficulties  Cognition Arousal/Alertness: Awake/alert Behavior During Therapy: WFL for tasks assessed/performed Overall Cognitive Status: Within Functional Limits for tasks assessed                                          General Comments      Exercises     Assessment/Plan    PT Assessment Patient does not need any further PT services  PT Problem List         PT Treatment Interventions      PT Goals (Current goals can be found in the Care Plan section)  Acute Rehab PT Goals Patient Stated Goal: return home PT Goal Formulation: With patient Time For Goal Achievement: 11/24/22 Potential to Achieve Goals: Good    Frequency       Co-evaluation               AM-PAC PT "6 Clicks" Mobility  Outcome Measure Help needed turning from your back to your side while in a flat bed without using bedrails?: None Help needed moving from lying on your back to sitting on the side of a flat bed without using bedrails?: None Help needed moving to and from a bed to a chair (including a wheelchair)?: None Help needed standing up from a chair using your arms (e.g., wheelchair or bedside chair)?: None Help needed to walk in hospital room?: None Help needed climbing 3-5 steps with a railing? : None 6 Click  Score: 24    End of Session   Activity Tolerance: Patient tolerated treatment well Patient left: in bed;with call bell/phone within reach Nurse Communication: Mobility status PT Visit Diagnosis: Unsteadiness on feet (R26.81);Other abnormalities of gait and mobility (R26.89);Muscle weakness (generalized) (M62.81)    Time: [id number] PT Time Calculation (min) (ACUTE ONLY): 13 min   Charges:   PT Evaluation $PT Eval Low Complexity: 1 Low PT Treatments $Therapeutic Exercise: 8-22 mins        11:37 AM, 11/24/22 Lonell Grandchild, MPT Physical Therapist with [place]  [place] office [phone] mobile phone

## 2022-11-24 NOTE — TOC Benefit Eligibility Note (Signed)
Patient Teacher, English as a foreign language completed.    The patient is currently admitted and upon discharge could be taking Jardiance 10 mg.  The current 30 day co-pay is $45.00.   The patient is currently admitted and upon discharge could be taking Farxiga 10 mg.  The current 30 day co-pay is $95.00.   The patient is currently admitted and upon discharge could be taking ranolazine (Ranexa) 500 mg.  The current 30 day co-pay is $16.05.   The patient is insured through [place], [place] Patient Advocate Specialist [place] Patient Advocate Team Direct Number: [phone]  Fax: [phone]

## 2022-11-24 NOTE — TOC Transition Note (Addendum)
Transition of Care [facility]) - CM/SW Discharge Note   Patient Details  Name: Shawn Lopez MRN: [id number] Date of Birth: 1941/01/27  [facility] CM/SW Contact:  Boneta Lucks, RN Phone Number: 11/24/2022, 10:24 AM   Clinical Narrative:   Patient evaluated for chest pain. RN will ambulate and planning for discharge home. Patient work 6 hours a day. No needs at this time.   PT eval - no follow up needed.  Final next level of care: Home/Self Care Barriers to Discharge: Barriers Resolved   Patient Goals and CMS Choice CMS Medicare.gov Compare Post Acute Care list provided to:: Patient    Discharge Placement    Patient and family notified of of transfer: 11/24/22  Discharge Plan and Services Additional resources added to the After Visit Summary for         Social Determinants of Health (SDOH) Interventions SDOH Screenings   Tobacco Use: Medium Risk (11/23/2022)

## 2022-11-24 NOTE — Hospital Course (Signed)
LAKEN FINCANNON is a 82 y.o. male with medical history significant of atrial fibrillation not on anticoagulation or any medication for rate control, coronary artery disease, hypertension, hyperlipidemia, type 2 diabetes mellitus, presents to the ED with a chief complaint of dizziness.   Patient reports he had 3 episodes of dizziness and palpitations today.  They each lasted 30-40 minutes.  His blood pressure also spiked during these episodes.  He was sitting down when they happened.  Patient had an associated burning in his chest at the midclavicular line on the left.  It is worth noting that in the cardiology notes patient was not able to tolerate Imdur, but felt that his chest pain was better after he was started on an antacid by his PCP.  Patient reports that the pain does not radiate.  He does have soreness in his shoulders bilaterally, and this has been going on for couple of months. He also reports that he had abnormal peripheral edema last night.  He reports his legs have swelled before, but last night they swelled more than normal.  It was gone by morning.   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  He had stents placed at that time and he is on aspirin and Plavix.    ED:  Temp 98.4, heart rate 57, respiratory rate 16-20, blood pressure 157/68-180/72, satting at 99% No leukocytosis WBC 7.3, hemoglobin stable at 14.9 Chemistry is unremarkable Troponin 6, 7 UA is not indicative of UTI Chest x-ray shows no active cardiopulmonary disease EKG is without acute ischemic changes, working on getting an EKG in the electronic chart Aspirin 325 given in the ED Cardiology was consulted and recommended admission for observation

## 2022-11-24 NOTE — Plan of Care (Signed)

## 2022-11-25 LAB — HEMOGLOBIN A1C
Hgb A1c MFr Bld: 7.2 % — ABNORMAL HIGH (ref 4.8–5.6)
Mean Plasma Glucose: 160 mg/dL

## 2022-12-06 ENCOUNTER — Emergency Department (HOSPITAL_COMMUNITY): Payer: Medicare HMO

## 2022-12-06 ENCOUNTER — Other Ambulatory Visit: Payer: Self-pay

## 2022-12-06 ENCOUNTER — Emergency Department (HOSPITAL_COMMUNITY)
Admission: EM | Admit: 2022-12-06 | Discharge: 2022-12-06 | Disposition: A | Discharge status: Home / Self Care | Payer: Medicare HMO | Attending: Emergency Medicine | Admitting: Emergency Medicine

## 2022-12-06 ENCOUNTER — Encounter (HOSPITAL_COMMUNITY): Payer: Self-pay

## 2022-12-06 DIAGNOSIS — Z7902 Long term (current) use of antithrombotics/antiplatelets: Secondary | ICD-10-CM | POA: Diagnosis not present

## 2022-12-06 DIAGNOSIS — I10 Essential (primary) hypertension: Secondary | ICD-10-CM | POA: Insufficient documentation

## 2022-12-06 DIAGNOSIS — R42 Dizziness and giddiness: Secondary | ICD-10-CM | POA: Diagnosis present

## 2022-12-06 DIAGNOSIS — R001 Bradycardia, unspecified: Secondary | ICD-10-CM | POA: Insufficient documentation

## 2022-12-06 DIAGNOSIS — Z7982 Long term (current) use of aspirin: Secondary | ICD-10-CM | POA: Diagnosis not present

## 2022-12-06 DIAGNOSIS — E119 Type 2 diabetes mellitus without complications: Secondary | ICD-10-CM | POA: Diagnosis not present

## 2022-12-06 DIAGNOSIS — Z79899 Other long term (current) drug therapy: Secondary | ICD-10-CM | POA: Diagnosis not present

## 2022-12-06 DIAGNOSIS — I2510 Atherosclerotic heart disease of native coronary artery without angina pectoris: Secondary | ICD-10-CM | POA: Diagnosis not present

## 2022-12-06 LAB — CBC
HCT: 44 % (ref 39.0–52.0)
Hemoglobin: 14.6 g/dL (ref 13.0–17.0)
MCH: 30.8 pg (ref 26.0–34.0)
MCHC: 33.2 g/dL (ref 30.0–36.0)
MCV: 92.8 fL (ref 80.0–100.0)
Platelets: 172 10*3/uL (ref 150–400)
RBC: 4.74 MIL/uL (ref 4.22–5.81)
RDW: 13.1 % (ref 11.5–15.5)
WBC: 6.1 10*3/uL (ref 4.0–10.5)
nRBC: 0 % (ref 0.0–0.2)

## 2022-12-06 LAB — COMPREHENSIVE METABOLIC PANEL
ALT: 25 U/L (ref 0–44)
AST: 24 U/L (ref 15–41)
Albumin: 3.6 g/dL (ref 3.5–5.0)
Alkaline Phosphatase: 95 U/L (ref 38–126)
Anion gap: 10 (ref 5–15)
BUN: 16 mg/dL (ref 8–23)
CO2: 24 mmol/L (ref 22–32)
Calcium: 8.7 mg/dL — ABNORMAL LOW (ref 8.9–10.3)
Chloride: 103 mmol/L (ref 98–111)
Creatinine, Ser: 0.89 mg/dL (ref 0.61–1.24)
GFR, Estimated: >60 mL/min (ref >60)
Glucose, Bld: 174 mg/dL — ABNORMAL HIGH (ref 70–99)
Potassium: 3.6 mmol/L (ref 3.5–5.1)
Sodium: 137 mmol/L (ref 135–145)
Total Bilirubin: 0.8 mg/dL (ref 0.3–1.2)
Total Protein: 6.7 g/dL (ref 6.5–8.1)

## 2022-12-06 LAB — LIPASE, BLOOD: Lipase: 31 U/L (ref 11–51)

## 2022-12-06 LAB — TROPONIN I (HIGH SENSITIVITY)
Troponin I (High Sensitivity): 6 ng/L (ref <18)
Troponin I (High Sensitivity): 6 ng/L (ref <18)

## 2022-12-06 MED ORDER — ONDANSETRON HCL 4 MG/2ML IJ SOLN
4.0000 mg | Freq: Once | INTRAMUSCULAR | Status: DC
  Filled 2022-12-06: qty 2

## 2022-12-06 MED ORDER — ALUM & MAG HYDROXIDE-SIMETH 200-200-20 MG/5ML PO SUSP
30.0000 mL | Freq: Once | ORAL | Status: DC
  Filled 2022-12-06: qty 30

## 2022-12-06 NOTE — Discharge Instructions (Addendum)
Stop Metoprolol 

## 2022-12-06 NOTE — ED Triage Notes (Signed)
Pt states he woke up this morning "not feeling right", c/o dizziness and chest tightness. States when he gets chest tightness he gets anxious, shaky, and nauseous

## 2022-12-06 NOTE — ED Provider Notes (Signed)
[facility] Provider Note   CSN: [id number] Arrival date & time: 12/06/22  [id number]     History  Chief Complaint  Patient presents with   Dizziness    Shawn Lopez is a 82 y.o. male.  Pt is a 82 yo male with a pmhx significant for HLD, afib (not on thinners), HTN, CAD, DM2, and chronic back pain.  Pt said he woke up around 0400 and felt some tightness in his chest.  He said he felt a little dizzy.  He said he would get the tightness, feel nauseous, and felt jittery.  He has never felt this way in the past.  He feels ok now.  It comes and goes.  No f/c.  No sob.  No sweating.  He took his normal morning meds at 0500.  He drove here and was able to drive fine.  No other neuro sx.  Pt admitted 2/29-3/1 for atypical chest pain and dizziness.   Stress test on 11/24/22:  The study is normal. There are no perfusion defects The study is low risk.   No ST deviation was noted.   LV perfusion is normal.   Left ventricular function is normal. Nuclear stress EF: 64 %. The left ventricular ejection fraction is normal (55-65%). End diastolic cavity size is normal.  Dr. Saunders Revel saw him during hospital stay and recommended:  1. Continue aggressive medical therapy, including indefinite dual antiplatelet therapy with aspirin and clopidogrel given long stented segment of the LAD. 2. Question if chronotropic incompetence is contributing to symptoms; I will decrease metoprolol tartrate to 12.5 twice daily. 3. Add amlodipine 2.5 mg daily for improved blood pressure control and antianginal therapy, as Mr. Stfort has been intolerant of isosorbide mononitrate.  Pt said he was not on metoprolol before hospital stay and it was not on his med list on 2/8 when he saw Dr. Domenic Polite.  Pt said his HR has been in the 40s.  Pt said he did not start the norvasc yet b/c he only wanted to start 1 medicine at a time.  However, Dr. Harl Bowie did an addendum and  said:  CAD/chest pain - history of CAD with stents as reported above - long history of chest pains, cath 2018, nuclear stress 2021 and 2022 low risk - trops negative this admission, EKG without acute ischemic changes     - 2 years since last ischemic evaluation, plan for myoview this admission - medical therapy with ASA 81, atorva 80, plavix 75, lisinopril 20.  - imdur caused headaches in the past. Low HRs avoid beta blocker. Can consider norvasc or ranexa as antianginal. From Dr McDowell's note consider norvasc if needed at last appt.            Home Medications Prior to Admission medications   Medication Sig Start Date End Date Taking? Authorizing Provider  alum & mag hydroxide-simeth (MAALOX/MYLANTA) 200-200-20 MG/5ML suspension Take 30 mLs by mouth every 8 (eight) hours. 11/24/22   Shahmehdi, Valeria Batman, MD  amLODipine (NORVASC) 2.5 MG tablet Take 1 tablet (2.5 mg total) by mouth daily. 11/24/22 11/24/23  Deatra James, MD  aspirin EC 81 MG EC tablet Take 1 tablet (81 mg total) by mouth daily. 01/17/17   Ghimire, Henreitta Leber, MD  atorvastatin (LIPITOR) 80 MG tablet Take 1 tablet (80 mg total) by mouth daily at 6 PM. 03/09/17   Lendon Colonel, NP  clopidogrel (PLAVIX) 75 MG tablet Take 1 tablet (75 mg  total) by mouth daily. 03/09/17   Lendon Colonel, NP  glimepiride (AMARYL) 2 MG tablet Take 1 mg by mouth daily. 10/13/21   [provider]  HYDROcodone-acetaminophen (NORCO) 10-325 MG tablet Take 1 tablet by mouth every 4 (four) hours as needed for moderate pain or severe pain. 09/27/22   [provider]  lisinopril (ZESTRIL) 10 MG tablet Take 10 mg by mouth daily. 10/05/22   [provider]  nitroGLYCERIN (NITROSTAT) 0.4 MG SL tablet Place 1 tablet (0.4 mg total) under the tongue every 5 (five) minutes as needed for chest pain. For 3 doses only 01/16/17   Ghimire, Henreitta Leber, MD  pantoprazole (PROTONIX) 40 MG tablet Take 40 mg by mouth daily. 10/20/22    [provider]      Allergies    Brilinta [ticagrelor] and Other    Review of Systems   Review of Systems  Cardiovascular:  Positive for chest pain.  Gastrointestinal:  Positive for nausea.  All other systems reviewed and are negative.   Physical Exam Updated Vital Signs BP (!) 147/67   Pulse (!) 46   Temp 97.8 F (36.6 C)   Resp 17   Ht '5\' 10"'$  (1.778 m)   Wt 84 kg   SpO2 95%   BMI 26.57 kg/m  Physical Exam Vitals and nursing note reviewed.  Constitutional:      Appearance: Normal appearance.  HENT:     Head: Normocephalic and atraumatic.     Right Ear: External ear normal.     Left Ear: External ear normal.     Nose: Nose normal.     Mouth/Throat:     Mouth: Mucous membranes are moist.     Pharynx: Oropharynx is clear.  Eyes:     Extraocular Movements: Extraocular movements intact.     Conjunctiva/sclera: Conjunctivae normal.     Pupils: Pupils are equal, round, and reactive to light.  Cardiovascular:     Rate and Rhythm: Regular rhythm. Bradycardia present.     Pulses: Normal pulses.     Heart sounds: Normal heart sounds.  Pulmonary:     Effort: Pulmonary effort is normal.     Breath sounds: Normal breath sounds.  Abdominal:     General: Abdomen is flat. Bowel sounds are normal.     Palpations: Abdomen is soft.  Musculoskeletal:        General: Normal range of motion.     Cervical back: Normal range of motion and neck supple.  Skin:    General: Skin is warm.     Capillary Refill: Capillary refill takes less than 2 seconds.  Neurological:     General: No focal deficit present.     Mental Status: He is alert and oriented to person, place, and time.  Psychiatric:        Mood and Affect: Mood normal.        Behavior: Behavior normal.     ED Results / Procedures / Treatments   Labs (all labs ordered are listed, but only abnormal results are displayed) Labs Reviewed  COMPREHENSIVE METABOLIC PANEL - Abnormal; Notable for the following  components:      Result Value   Glucose, Bld 174 (*)    Calcium 8.7 (*)    All other components within normal limits  CBC  LIPASE, BLOOD  TROPONIN I (HIGH SENSITIVITY)  TROPONIN I (HIGH SENSITIVITY)    EKG EKG Interpretation  Date/Time:  Wednesday December 06 2022 06:42:14 EDT Ventricular Rate:  59  PR Interval:  171 QRS Duration: 112 QT Interval:  451 QTC Calculation: 447 R Axis:   55 Text Interpretation: Sinus rhythm Borderline intraventricular conduction delay Borderline T wave abnormalities No significant change since last tracing Confirmed by Isla Pence [phone]) on 12/06/2022 6:56:35 AM  Radiology DG Chest Port 1 View  Result Date: 12/06/2022 CLINICAL DATA:  Chest pain and dizziness. Tightness in chest since 4 a.m. EXAM: PORTABLE CHEST 1 VIEW COMPARISON:  11/23/2022 FINDINGS: Normal cardiomediastinal contours. Lung volumes are low. No pleural effusion or edema. No airspace opacities identified visualized osseous structures are unremarkable. IMPRESSION: Low lung volumes. No acute findings. Electronically Signed   By: Kerby Moors M.D.   On: 12/06/2022 07:25    Procedures Procedures    Medications Ordered in ED Medications  ondansetron (ZOFRAN) injection 4 mg (4 mg Intravenous Not Given 12/06/22 0718)  alum & mag hydroxide-simeth (MAALOX/MYLANTA) 200-200-20 MG/5ML suspension 30 mL (30 mLs Oral Not Given 12/06/22 [id number])    ED Course/ Medical Decision Making/ A&P                             Medical Decision Making Amount and/or Complexity of Data Reviewed Labs: ordered. Radiology: ordered.  Risk OTC drugs. Prescription drug management.   This patient presents to the ED for concern of cp, this involves an extensive number of treatment options, and is a complaint that carries with it a high risk of complications and morbidity.  The differential diagnosis includes cardiac, pulm, gi   Co morbidities that complicate the patient evaluation  HLD, afib (not on  thinners), HTN, CAD, DM2, and chronic back pain   Additional history obtained:  Additional history obtained from epic chart review    Lab Tests:  I Ordered, and personally interpreted labs.  The pertinent results include:  cbc nl, cmp nl other than bs 174, trop nl, lip nl   Imaging Studies ordered:  I ordered imaging studies including cxr  I independently visualized and interpreted imaging which showed Low lung volumes. No acute findings.  I agree with the radiologist interpretation   Cardiac Monitoring:  The patient was maintained on a cardiac monitor.  I personally viewed and interpreted the cardiac monitored which showed an underlying rhythm of: nsr   Medicines ordered and prescription drug management:  I have reviewed the patients home medicines and have made adjustments as needed  Consultations Obtained:  I requested consultation with the cardiologist (Dr. Dellia Cloud),  and discussed lab and imaging findings as well as pertinent plan - she agrees with stopping metoprolol   Problem List / ED Course:  Bradycardia:  1 cardiologist put pt on metoprolol, but another said not to put him on bb b/c of hx of bradycardia.  Somehow, pt got sent home with a rx for it.  HR in the 40s, but he is able to ambulate and looks good.  I don't think he needs to stay for a washout.  Pt is told to d/c the metoprolol.  He understands plan.  He is to return if worse.  F/u with cards. Chest pain:  atypical.  No ST changes on EKG.  2 troponins nl.   Reevaluation:  After the interventions noted above, I reevaluated the patient and found that they have :improved   Social Determinants of Health:  Lives at  home   Dispostion:  After consideration of the diagnostic results and the patients response to treatment, I feel that the patent  would benefit from discharge with outpatient f/u.          Final Clinical Impression(s) / ED Diagnoses Final diagnoses:  Bradycardia    Rx / DC  Orders ED Discharge Orders     None         Isla Pence, MD 12/06/22 1006

## 2022-12-22 ENCOUNTER — Encounter: Payer: Self-pay | Admitting: Student

## 2022-12-22 ENCOUNTER — Ambulatory Visit: Payer: Medicare HMO | Attending: Student | Admitting: Student

## 2022-12-22 VITALS — BP 138/72 | HR 56 | Ht 70.0 in | Wt 184.0 lb

## 2022-12-22 DIAGNOSIS — R002 Palpitations: Secondary | ICD-10-CM | POA: Diagnosis not present

## 2022-12-22 DIAGNOSIS — I2510 Atherosclerotic heart disease of native coronary artery without angina pectoris: Secondary | ICD-10-CM

## 2022-12-22 DIAGNOSIS — E785 Hyperlipidemia, unspecified: Secondary | ICD-10-CM

## 2022-12-22 DIAGNOSIS — I10 Essential (primary) hypertension: Secondary | ICD-10-CM | POA: Diagnosis not present

## 2022-12-22 NOTE — Patient Instructions (Signed)
Medication Instructions:  Your physician recommends that you continue on your current medications as directed. Please refer to the Current Medication list given to you today.   Labwork: None today  Testing/Procedures: None today  Follow-Up: 6 months  Any Other Special Instructions Will Be Listed Below (If Applicable).  If you need a refill on your cardiac medications before your next appointment, please call your pharmacy.  

## 2022-12-22 NOTE — Progress Notes (Signed)
Cardiology Office Note    Date:  12/22/2022  ID:  Shawn Lopez, Shawn Lopez 01-Sep-1941, MRN [id number] Cardiologist: Rozann Lesches, MD  ---> Patient requests to switch to Dr. Harl Bowie  History of Present Illness:    Shawn Lopez is a 82 y.o. male with past medical history of CAD (s/p stenting to LAD and OM1 in 12/2016 patent stents by cath in 03/2019, low-risk NST in 01/2021), palpitations (PAC's and PVC's with brief SVT by prior monitor), HTN, HLD and Type II DM who presents to the office today for hospital follow-up.  He was most recently admitted to [facility] from 2/29 - 11/24/2022 for evaluation of chest pain which started the day prior to admission. Troponin values were negative and EKG was without acute ST changes. A Lexiscan Myoview was recommended for evaluation and this showed no evidence of ischemia or infarction and EF was normal at 64%. The study was overall low-risk. He was continued on ASA, Atorvastatin, Plavix and Lisinopril. He was previously intolerant to Imdur, therefore he was started on Lopressor 12.5 mg twice daily along with Amlodipine 2.5 mg daily for antianginal benefit.  He again presented to the ED on 12/06/2022 for evaluation of dizziness and chest tightness. He was found to be bradycardic with heart rate in the 40's, therefore Metoprolol was discontinued. Again, troponins were negative and EKG showed no acute ST changes.  In talking with the patient today, he reports overall doing well since his recent Emergency Department visit. He denies any recurrent chest pain since Lopressor was stopped. Says that he feels like his prior pain was secondary to acid reflux as it resolved after being started on Protonix. He remains active at baseline as he still works in [employer] and denies any anginal symptoms with exertional activity. No specific orthopnea, PND or pitting edema. He did stop taking Amlodipine as he felt like this was getting his blood pressure too low with the  medication. Says he feels fatigued and dizzy when his SBP is in the 110's or lower. Prefers for his SBP to be in the 130's to 150's as he feels better with this.  Studies Reviewed:   EKG: EKG from 12/06/2022 is reviewed and shows sinus bradycardia, HR 53 with IVCD.   NST: 11/2022   The study is normal. There are no perfusion defects The study is low risk.   No ST deviation was noted.   LV perfusion is normal.   Left ventricular function is normal. Nuclear stress EF: 64 %. The left ventricular ejection fraction is normal (55-65%). End diastolic cavity size is normal.   Physical Exam:   VS:  BP 138/72   Pulse (!) 56   Ht 5\' 10"  (1.778 m)   Wt 184 lb (83.5 kg)   SpO2 96%   BMI 26.40 kg/m    Wt Readings from Last 3 Encounters:  12/22/22 184 lb (83.5 kg)  12/06/22 185 lb 3 oz (84 kg)  11/23/22 185 lb (83.9 kg)    GEN: Pleasant, elderly male appearing in no acute distress NECK: No JVD; No carotid bruits CARDIAC: RRR, no murmurs, rubs, gallops RESPIRATORY:  Clear to auscultation without rales, wheezing or rhonchi  ABDOMEN: Appears non-distended. No obvious abdominal masses. EXTREMITIES: No clubbing or cyanosis. No pitting edema.  Distal pedal pulses are 2+ bilaterally.   Assessment and Plan:   1. CAD - He underwent stenting to the LAD and OM1 in 12/2016 patent stents by cath in 03/2019. He did have a low-risk NST  in 01/2021 and recent NST on 11/24/2022 was low-risk as well.  - He denies any repeat anginal symptoms. - He has been continued on DAPT with ASA 81mg  daily and Plavix 75mg  daily. Continue Atorvastatin 80mg  daily. No longer on beta-blocker therapy due to bradycardia.   2. Palpitations - Prior monitor showed PAC's and PVC's with brief SVT. Reports occasional palpitations but no persistent symptoms. He has not been on AV nodal blocking agents given baseline bradycardia.   3. HTN - BP is well-controlled at 138/72 during today's visit. Continue Lisinopril 10mg  daily.   4.  HLD - FLP in 11/2022 showed total cholesterol 108, triglycerides 68, HDL 38 and LDL 56. Continue current medical therapy with Atorvastatin 80 mg daily.   Signed, Erma Heritage, PA-C

## 2022-12-25 ENCOUNTER — Telehealth: Payer: Self-pay

## 2022-12-25 ENCOUNTER — Encounter (INDEPENDENT_AMBULATORY_CARE_PROVIDER_SITE_OTHER): Payer: Self-pay | Admitting: *Deleted

## 2022-12-25 ENCOUNTER — Encounter (INDEPENDENT_AMBULATORY_CARE_PROVIDER_SITE_OTHER): Payer: Self-pay | Admitting: Gastroenterology

## 2022-12-25 ENCOUNTER — Ambulatory Visit (INDEPENDENT_AMBULATORY_CARE_PROVIDER_SITE_OTHER): Payer: Medicare HMO | Admitting: Gastroenterology

## 2022-12-25 NOTE — Telephone Encounter (Signed)
Pt wants to switch providers from [place] to [place].   I will message both providers.

## 2023-03-07 ENCOUNTER — Encounter (INDEPENDENT_AMBULATORY_CARE_PROVIDER_SITE_OTHER): Payer: Self-pay | Admitting: *Deleted

## 2023-03-12 ENCOUNTER — Emergency Department (HOSPITAL_COMMUNITY)
Admission: EM | Admit: 2023-03-12 | Discharge: 2023-03-12 | Disposition: A | Discharge status: Home / Self Care | Payer: Medicare HMO | Attending: Emergency Medicine | Admitting: Emergency Medicine

## 2023-03-12 ENCOUNTER — Encounter (HOSPITAL_COMMUNITY): Payer: Self-pay | Admitting: Emergency Medicine

## 2023-03-12 ENCOUNTER — Other Ambulatory Visit: Payer: Self-pay

## 2023-03-12 ENCOUNTER — Emergency Department (HOSPITAL_COMMUNITY): Payer: Medicare HMO

## 2023-03-12 ENCOUNTER — Ambulatory Visit: Payer: Medicare HMO | Admitting: Nurse Practitioner

## 2023-03-12 DIAGNOSIS — I2510 Atherosclerotic heart disease of native coronary artery without angina pectoris: Secondary | ICD-10-CM | POA: Diagnosis not present

## 2023-03-12 DIAGNOSIS — Z7901 Long term (current) use of anticoagulants: Secondary | ICD-10-CM | POA: Insufficient documentation

## 2023-03-12 DIAGNOSIS — Z7984 Long term (current) use of oral hypoglycemic drugs: Secondary | ICD-10-CM | POA: Insufficient documentation

## 2023-03-12 DIAGNOSIS — Z79899 Other long term (current) drug therapy: Secondary | ICD-10-CM | POA: Insufficient documentation

## 2023-03-12 DIAGNOSIS — Z7982 Long term (current) use of aspirin: Secondary | ICD-10-CM | POA: Insufficient documentation

## 2023-03-12 DIAGNOSIS — I10 Essential (primary) hypertension: Secondary | ICD-10-CM | POA: Diagnosis not present

## 2023-03-12 DIAGNOSIS — R55 Syncope and collapse: Secondary | ICD-10-CM | POA: Insufficient documentation

## 2023-03-12 DIAGNOSIS — E119 Type 2 diabetes mellitus without complications: Secondary | ICD-10-CM | POA: Diagnosis not present

## 2023-03-12 DIAGNOSIS — I4891 Unspecified atrial fibrillation: Secondary | ICD-10-CM | POA: Diagnosis not present

## 2023-03-12 LAB — TROPONIN I (HIGH SENSITIVITY)
Troponin I (High Sensitivity): 4 ng/L (ref <18)
Troponin I (High Sensitivity): 5 ng/L (ref <18)

## 2023-03-12 LAB — CBC
HCT: 45.6 % (ref 39.0–52.0)
Hemoglobin: 15 g/dL (ref 13.0–17.0)
MCH: 30.8 pg (ref 26.0–34.0)
MCHC: 32.9 g/dL (ref 30.0–36.0)
MCV: 93.6 fL (ref 80.0–100.0)
Platelets: 182 10*3/uL (ref 150–400)
RBC: 4.87 MIL/uL (ref 4.22–5.81)
RDW: 13.5 % (ref 11.5–15.5)
WBC: 7.9 10*3/uL (ref 4.0–10.5)
nRBC: 0 % (ref 0.0–0.2)

## 2023-03-12 LAB — BASIC METABOLIC PANEL
Anion gap: 7 (ref 5–15)
BUN: 16 mg/dL (ref 8–23)
CO2: 26 mmol/L (ref 22–32)
Calcium: 8.9 mg/dL (ref 8.9–10.3)
Chloride: 102 mmol/L (ref 98–111)
Creatinine, Ser: 0.92 mg/dL (ref 0.61–1.24)
GFR, Estimated: >60 mL/min (ref >60)
Glucose, Bld: 106 mg/dL — ABNORMAL HIGH (ref 70–99)
Potassium: 4 mmol/L (ref 3.5–5.1)
Sodium: 135 mmol/L (ref 135–145)

## 2023-03-12 LAB — GLUCOSE, CAPILLARY: Glucose-Capillary: 101 mg/dL — ABNORMAL HIGH (ref 70–99)

## 2023-03-12 MED ORDER — IOHEXOL 350 MG/ML SOLN
80.0000 mL | Freq: Once | INTRAVENOUS | Status: AC | PRN
  Administered 2023-03-12: 75 mL via INTRAVENOUS

## 2023-03-12 NOTE — ED Provider Notes (Signed)
[place] EMERGENCY DEPARTMENT AT [facility] Provider Note   CSN: [id number] Arrival date & time: 03/12/23  1017     History  Chief Complaint  Patient presents with   Chest Pain   Dizziness    Shawn Lopez is a 82 y.o. male.   Chest Pain Associated symptoms: diaphoresis and dizziness   Dizziness Associated symptoms: chest pain   Patient presents for near syncope.  Medical history includes atrial fibrillation, HTN, HLD, DM, chronic pain, CAD.  He was last seen by cardiology in March.  He has previously undergone cardiac stenting in 2018.  He has been on DAPT.  He has a history of bradycardia and was taken off of beta-blocker therapy.  Today, patient stated that he experienced chest tightness, dizziness, diaphoresis with activity. Symptoms improved with rest.  Currently, he is asymptomatic.     Home Medications Prior to Admission medications   Medication Sig Start Date End Date Taking? Authorizing Provider  alum & mag hydroxide-simeth (MAALOX/MYLANTA) 200-200-20 MG/5ML suspension Take 30 mLs by mouth every 8 (eight) hours. 11/24/22   Shahmehdi, Gemma Payor, MD  aspirin EC 81 MG EC tablet Take 1 tablet (81 mg total) by mouth daily. 01/17/17   Ghimire, Werner Lean, MD  atorvastatin (LIPITOR) 80 MG tablet Take 1 tablet (80 mg total) by mouth daily at 6 PM. 03/09/17   Jodelle Gross, NP  clopidogrel (PLAVIX) 75 MG tablet Take 1 tablet (75 mg total) by mouth daily. 03/09/17   Jodelle Gross, NP  glimepiride (AMARYL) 2 MG tablet Take 1 mg by mouth daily. 10/13/21   [provider]  HYDROcodone-acetaminophen (NORCO) 10-325 MG tablet Take 1 tablet by mouth every 4 (four) hours as needed for moderate pain or severe pain. 09/27/22   [provider]  lisinopril (ZESTRIL) 10 MG tablet Take 10 mg by mouth daily. 10/05/22   [provider]  nitroGLYCERIN (NITROSTAT) 0.4 MG SL tablet Place 1 tablet (0.4 mg total) under the tongue every 5 (five) minutes as  needed for chest pain. For 3 doses only 01/16/17   Ghimire, Werner Lean, MD  pantoprazole (PROTONIX) 40 MG tablet Take 40 mg by mouth daily. 10/20/22   [provider]      Allergies    Brilinta [ticagrelor] and Other    Review of Systems   Review of Systems  Constitutional:  Positive for diaphoresis.  Cardiovascular:  Positive for chest pain.  Neurological:  Positive for dizziness.  All other systems reviewed and are negative.   Physical Exam Updated Vital Signs BP (!) 161/69   Pulse (!) 51   Temp 98.2 F (36.8 C) (Oral)   Resp 17   SpO2 96%  Physical Exam Vitals and nursing note reviewed.  Constitutional:      General: He is not in acute distress.    Appearance: He is well-developed. He is not ill-appearing, toxic-appearing or diaphoretic.  HENT:     Head: Normocephalic and atraumatic.  Eyes:     Conjunctiva/sclera: Conjunctivae normal.  Cardiovascular:     Rate and Rhythm: Normal rate and regular rhythm.     Heart sounds: No murmur heard. Pulmonary:     Effort: Pulmonary effort is normal. No respiratory distress.     Breath sounds: Normal breath sounds. No decreased breath sounds, wheezing, rhonchi or rales.  Chest:     Chest wall: No tenderness.  Abdominal:     Palpations: Abdomen is soft.     Tenderness: There is no  abdominal tenderness.  Musculoskeletal:        General: No swelling. Normal range of motion.     Cervical back: Normal range of motion and neck supple.     Right lower leg: No edema.     Left lower leg: No edema.  Skin:    General: Skin is warm and dry.     Capillary Refill: Capillary refill takes less than 2 seconds.     Coloration: Skin is not cyanotic or pale.  Neurological:     General: No focal deficit present.     Mental Status: He is alert and oriented to person, place, and time.  Psychiatric:        Mood and Affect: Mood normal.        Behavior: Behavior normal.     ED Results / Procedures / Treatments   Labs (all labs  ordered are listed, but only abnormal results are displayed) Labs Reviewed  BASIC METABOLIC PANEL - Abnormal; Notable for the following components:      Result Value   Glucose, Bld 106 (*)    All other components within normal limits  GLUCOSE, CAPILLARY - Abnormal; Notable for the following components:   Glucose-Capillary 101 (*)    All other components within normal limits  CBC  TROPONIN I (HIGH SENSITIVITY)  TROPONIN I (HIGH SENSITIVITY)    EKG None  Radiology CT Angio Chest PE W and/or Wo Contrast  Result Date: 03/12/2023 CLINICAL DATA:  Pulmonary embolism (PE) suspected, high prob EXAM: CT ANGIOGRAPHY CHEST WITH CONTRAST TECHNIQUE: Multidetector CT imaging of the chest was performed using the standard protocol during bolus administration of intravenous contrast. Multiplanar CT image reconstructions and MIPs were obtained to evaluate the vascular anatomy. RADIATION DOSE REDUCTION: This exam was performed according to the departmental dose-optimization program which includes automated exposure control, adjustment of the mA and/or kV according to patient size and/or use of iterative reconstruction technique. CONTRAST:  75mL OMNIPAQUE IOHEXOL 350 MG/ML SOLN COMPARISON:  July 03, 2022 FINDINGS: Cardiovascular: Satisfactory opacification of the pulmonary arteries to the segmental level. No evidence of pulmonary embolism. Mildly limited evaluation of portions of the distal subsegmental arteries due to respiratory motion. Heart is enlarged. No pericardial effusion. Aortic valve calcifications. Three-vessel coronary artery atherosclerotic calcifications. Scattered atherosclerotic calcifications of nonaneurysmal thoracic aorta. Mediastinum/Nodes: Visualized thyroid is unremarkable. No axillary or mediastinal adenopathy. Lungs/Pleura: No pleural effusion or pneumothorax. Mild bronchial wall thickening. Minimal interlobular septal thickening. Scattered calcified pulmonary nodules. Upper Abdomen: No  acute abnormality. Musculoskeletal: No acute osseous abnormality. Chronic wedging of the T12 vertebral body. Review of the MIP images confirms the above findings. IMPRESSION: 1. No acute pulmonary embolism. 2. Cardiomegaly with minimal interlobular septal thickening suggesting mild pulmonary edema. Aortic Atherosclerosis (ICD10-I70.0). Electronically Signed   By: Meda Klinefelter M.D.   On: 03/12/2023 13:15   DG Chest 2 View  Result Date: 03/12/2023 CLINICAL DATA:  Chest pain.  Lightheadedness EXAM: CHEST - 2 VIEW COMPARISON:  X-ray 12/06/2022 FINDINGS: No consolidation, pneumothorax or effusion. No edema. Normal cardiopericardial silhouette. Degenerative changes of the spine. IMPRESSION: No acute cardiopulmonary disease. Electronically Signed   By: Karen Kays M.D.   On: 03/12/2023 11:40    Procedures Procedures    Medications Ordered in ED Medications  iohexol (OMNIPAQUE) 350 MG/ML injection 80 mL (75 mLs Intravenous Contrast Given 03/12/23 1222)    ED Course/ Medical Decision Making/ A&P  Medical Decision Making Amount and/or Complexity of Data Reviewed Labs: ordered. Radiology: ordered.  Risk Prescription drug management.   This patient presents to the ED for concern of near syncope, this involves an extensive number of treatment options, and is a complaint that carries with it a high risk of complications and morbidity.  The differential diagnosis includes arrhythmia, CHF, valvular dysfunction, anxiety, dehydration, PE   Co morbidities that complicate the patient evaluation  atrial fibrillation, HTN, HLD, DM, chronic pain, CAD   Additional history obtained:  Additional history obtained from N/A External records from outside source obtained and reviewed including EMR   Lab Tests:  I Ordered, and personally interpreted labs.  The pertinent results include: Normal hemoglobin, no leukocytosis, normal troponins x 2, normal kidney function,  normal electrolytes   Imaging Studies ordered:  I ordered imaging studies including chest x-ray, CT chest I independently visualized and interpreted imaging which showed no acute findings I agree with the radiologist interpretation   Cardiac Monitoring: / EKG:  The patient was maintained on a cardiac monitor.  I personally viewed and interpreted the cardiac monitored which showed an underlying rhythm of: Sinus rhythm   Consultations Obtained:  I requested consultation with the cardiologist, Dr. Tenny Craw,  and discussed lab and imaging findings as well as pertinent plan - they recommend: Given reassuring recent stress test, patient is appropriate for discharge with outpatient follow-up.   Problem List / ED Course / Critical interventions / Medication management  Patient presents for near syncopal episodes this morning.  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  He was at work this morning and undergoing light exertional activity.  He experienced 2 separate episodes that were described as chest tightness, lightheadedness, diaphoresis.  On arrival in the ED, patient is mildly bradycardic, which is his baseline.  Other vital signs are normal.  Patient is well-appearing on exam and denies any current symptoms.  EKG shows sinus bradycardia.  Laboratory workup was initiated.  Patient was kept on bedside cardiac monitor.  Lab results are reassuring.  Per chart review, patient had a similar admission in March.  At the time, he underwent inpatient cardiac stress testing, the results of which were reassuring.  I spoke with cardiologist on-call today, Dr. Tenny Craw, who is also reassured by his recent stress test and workup today.  She advised giving patient option to go home today with close outpatient follow-up versus admission for observation and cardiology consult in the morning.  I spoke with the patient about this and patient prefers to go home.  He has remained  asymptomatic while in the ED.  No concerning arrhythmias were identified on cardiac monitor.  He remained asymptomatic with ambulation in the ED.  He was discharged in stable condition.   Social Determinants of Health:  Has access to outpatient care         Final Clinical Impression(s) / ED Diagnoses Final diagnoses:  Near syncope    Rx / DC Orders ED Discharge Orders          Ordered    Ambulatory referral to Cardiology  Status:  Canceled       Comments: If you have not heard from the Cardiology office within the next 72 hours please call [phone].   03/12/23 1547    Ambulatory referral to Cardiology       Comments: If you have not heard from the Cardiology office within the next 72 hours please call [phone].   03/12/23  1548              Gloris Manchester, MD 03/12/23 [phone]

## 2023-03-12 NOTE — ED Notes (Signed)
Pt returns to room from CT via W/C.

## 2023-03-12 NOTE — ED Notes (Signed)
Pt ambulated in hall without difficulty. Denies return of symptoms that prompted his visit today.

## 2023-03-12 NOTE — Discharge Instructions (Signed)
You should hear from the cardiology office scheduler to set up a close follow-up appointment.  If you do not hear from them in the next 2 days, call the number below.  Return to the emergency department for any new or worsening symptoms of concern.

## 2023-03-12 NOTE — ED Notes (Signed)
AVS provided to and discussed with patient. Pt verbalizes understanding of discharge instructions and denies any questions or concerns at this time. Pt ambulated out of department independently with steady gait.  

## 2023-03-12 NOTE — ED Triage Notes (Addendum)
Pt states having chest tightness with dizziness and feeling like he is going to pass out with activity today with sweating and Shob. Symptoms stop after resting. A/o. States feels fine right now. Color wnl. Ambulated well to triage room. Pt is non diaphoretic at this time.

## 2023-03-14 ENCOUNTER — Encounter: Payer: Self-pay | Admitting: Cardiology

## 2023-03-14 ENCOUNTER — Ambulatory Visit: Payer: Medicare HMO | Attending: Cardiology

## 2023-03-14 ENCOUNTER — Ambulatory Visit: Payer: Medicare HMO | Attending: Cardiology | Admitting: Cardiology

## 2023-03-14 VITALS — BP 132/70 | HR 54 | Ht 70.5 in | Wt 185.0 lb

## 2023-03-14 DIAGNOSIS — R55 Syncope and collapse: Secondary | ICD-10-CM

## 2023-03-14 DIAGNOSIS — I25119 Atherosclerotic heart disease of native coronary artery with unspecified angina pectoris: Secondary | ICD-10-CM | POA: Diagnosis not present

## 2023-03-14 DIAGNOSIS — I10 Essential (primary) hypertension: Secondary | ICD-10-CM | POA: Diagnosis not present

## 2023-03-14 DIAGNOSIS — E782 Mixed hyperlipidemia: Secondary | ICD-10-CM

## 2023-03-14 NOTE — Patient Instructions (Signed)
Medication Instructions:  Your physician recommends that you continue on your current medications as directed. Please refer to the Current Medication list given to you today.  *If you need a refill on your cardiac medications before your next appointment, please call your pharmacy*   Lab Work: NONE   If you have labs (blood work) drawn today and your tests are completely normal, you will receive your results only by: [place] (if you have MyChart) OR A paper copy in the mail If you have any lab test that is abnormal or we need to change your treatment, we will call you to review the results.   Testing/Procedures: [place]- Long Term Monitor Instructions   Your physician has requested you wear your ZIO patch monitor__14___days.   This is a single patch monitor.  [employer] supplies one patch monitor per enrollment.  Additional stickers are not available.   Please do not apply patch if you will be having a Nuclear Stress Test, Echocardiogram, Cardiac CT, MRI, or Chest Xray during the time frame you would be wearing the monitor. The patch cannot be worn during these tests.  You cannot remove and re-apply the ZIO XT patch monitor.   Your ZIO patch monitor will be sent USPS Priority mail from [facility] directly to your home address. The monitor may also be mailed to a PO BOX if home delivery is not available.   It may take 3-5 days to receive your monitor after you have been enrolled.   Once you have received you monitor, please review enclosed instructions.  Your monitor has already been registered assigning a specific monitor serial # to you.   Applying the monitor   Shave hair from upper left chest.   Hold abrader disc by orange tab.  Rub abrader in 40 strokes over left upper chest as indicated in your monitor instructions.   Clean area with 4 enclosed alcohol pads .  Use all pads to assure are is cleaned thoroughly.  Let dry.   Apply patch as indicated in monitor  instructions.  Patch will be place under collarbone on left side of chest with arrow pointing upward.   Rub patch adhesive wings for 2 minutes.Remove white label marked "1".  Remove white label marked "2".  Rub patch adhesive wings for 2 additional minutes.   While looking in a mirror, press and release button in center of patch.  A small green light will flash 3-4 times .  This will be your only indicator the monitor has been turned on.     Do not shower for the first 24 hours.  You may shower after the first 24 hours.   Press button if you feel a symptom. You will hear a small click.  Record Date, Time and Symptom in the Patient Log Book.   When you are ready to remove patch, follow instructions on last 2 pages of Patient Log Book.  Stick patch monitor onto last page of Patient Log Book.   Place Patient Log Book in [place] box.  Use locking tab on box and tape box closed securely.  The [employer] and [employer] has [employer] on it.  Please place in mailbox as soon as possible.  Your physician should have your test results approximately 7 days after the monitor has been mailed back to [facility].   Call [place] at [phone] if you have questions regarding your ZIO XT patch monitor.  Call them immediately if you see an orange  light blinking on your monitor.   If your monitor falls off in less than 4 days contact our Monitor department at [phone].  If your monitor becomes loose or falls off after 4 days call [employer] at [phone] for suggestions on securing your monitor.     Follow-Up: At [facility], you and your health needs are our priority.  As part of our continuing mission to provide you with exceptional heart care, we have created designated Provider Care Teams.  These Care Teams include your primary Cardiologist (physician) and Advanced Practice Providers (APPs -  Physician Assistants and Nurse Practitioners) who all work together to provide  you with the care you need, when you need it.  We recommend signing up for the patient portal called "MyChart".  Sign up information is provided on this After Visit Summary.  MyChart is used to connect with patients for Virtual Visits (Telemedicine).  Patients are able to view lab/test results, encounter notes, upcoming appointments, etc.  Non-urgent messages can be sent to your provider as well.   To learn more about what you can do with MyChart, go to [email or web address].    Your next appointment:    After Zio monitor   Provider:   [name], MD    Other Instructions Thank you for choosing [facility]!

## 2023-03-14 NOTE — Progress Notes (Signed)
Cardiology Office Note  Date: 03/14/2023   ID: Sadie, Zander 07-27-1941, MRN [id number]  History of Present Illness: Shawn Lopez is an 82 y.o. male last seen in March by Ms. Strader PA-C, I reviewed the note.  He had been admitted prior to that for evaluation of chest pain, normal cardiac enzymes, and Lexiscan Myoview demonstrating no significant ischemia with LVEF 64%.  More recently he was seen in the ER for evaluation of near syncope.  Cardiac enzymes were again normal and his ECG showed no acute change.  Chest CTA ruled out pulmonary embolus and there was incidentally described cardiomegaly with minimal pulmonary edema pattern.  We discussed his symptoms today.  He states that he had been up walking around at a job site and suddenly felt very dizzy, nauseated, diaphoretic, like he might pass out.  He had to sit down and collect himself.  Symptoms occurred later but to lesser degree but he ultimately came to the ER.  Does not report any definite angina without event or specifically any exertional chest pain on a recurring basis.  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  He also has prior intolerance of Imdur secondary to headaches and fatigue.  I reviewed his present medications which are otherwise unchanged.  Physical Exam: VS:  BP 132/70   Pulse (!) 54   Ht 5' 10.5" (1.791 m)   Wt 185 lb (83.9 kg)   SpO2 97%   BMI 26.17 kg/m , BMI Body mass index is 26.17 kg/m.  Wt Readings from Last 3 Encounters:  03/14/23 185 lb (83.9 kg)  12/22/22 184 lb (83.5 kg)  12/06/22 185 lb 3 oz (84 kg)    General: Patient appears comfortable at rest. HEENT: Conjunctiva and lids normal. Neck: Supple, no elevated JVP or carotid bruits. Lungs: Clear to auscultation, nonlabored breathing at rest. Cardiac: Regular rate and rhythm, no S3, 1/6 systolic murmur. Extremities: No pitting edema.  ECG:  An ECG dated 12/06/2022  was personally reviewed today and demonstrated:  Sinus bradycardia with IVCD.  Labwork: 11/23/2022: B Natriuretic Peptide 39.0 11/24/2022: Magnesium 2.0 12/06/2022: ALT 25; AST 24 03/12/2023: BUN 16; Creatinine, Ser 0.92; Hemoglobin 15.0; Platelets 182; Potassium 4.0; Sodium 135     Component Value Date/Time   CHOL 108 11/24/2022 0710   CHOL 115 10/14/2021 0914   TRIG 68 11/24/2022 0710   HDL 38 (L) 11/24/2022 0710   HDL 40 10/14/2021 0914   CHOLHDL 2.8 11/24/2022 0710   VLDL 14 11/24/2022 0710   LDLCALC 56 11/24/2022 0710   LDLCALC 57 10/14/2021 0914   Other Studies Reviewed Today:  Lexiscan Myoview 11/24/2022:   The study is normal. There are no perfusion defects The study is low risk.   No ST deviation was noted.   LV perfusion is normal.   Left ventricular function is normal. Nuclear stress EF: 64 %. The left ventricular ejection fraction is normal (55-65%). End diastolic cavity size is normal.  Assessment and Plan:  1.  Episodic lightheadedness/near syncope as discussed above.  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  No evidence of ACS on recent workup and he did have a low risk Myoview in March.  Plan is to obtain a 14-day ZIO to assess for potential symptomatic bradycardia or pauses that may require further management.  He is off all AV nodal blockers at this  point.  If testing is unrevealing, we may need to consider coronary angiography.  2.  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  Follow-up cardiac catheterization in July 2020 demonstrated patent LAD stent sites with moderate in-stent restenosis in the mid LAD segment as well as widely patent first diagonal stent with moderate stenosis proximal to the stent.  Recent Lexiscan Myoview in March showed no active ischemia with LVEF 64%.  Currently on aspirin,  Plavix, Lipitor, lisinopril, and as needed nitroglycerin.  2.  Essential hypertension.  No changes made to current regimen, systolics in the 130s today.  3.  Mixed hyperlipidemia.  He remains on Lipitor 80 mg daily with LDL 56 in March.  Disposition:  Follow up  after cardiac monitor for reevaluation.  Signed, Jonelle Sidle, M.D., F.A.C.C. [facility]

## 2023-03-26 ENCOUNTER — Ambulatory Visit (INDEPENDENT_AMBULATORY_CARE_PROVIDER_SITE_OTHER): Payer: Medicare HMO | Admitting: Gastroenterology

## 2023-03-26 ENCOUNTER — Encounter (INDEPENDENT_AMBULATORY_CARE_PROVIDER_SITE_OTHER): Payer: Self-pay | Admitting: Gastroenterology

## 2023-03-26 VITALS — BP 144/73 | HR 58 | Temp 98.1°F | Ht 70.5 in | Wt 180.7 lb

## 2023-03-26 DIAGNOSIS — Z1211 Encounter for screening for malignant neoplasm of colon: Secondary | ICD-10-CM | POA: Insufficient documentation

## 2023-03-26 DIAGNOSIS — R634 Abnormal weight loss: Secondary | ICD-10-CM | POA: Diagnosis not present

## 2023-03-26 MED ORDER — POLYETHYLENE GLYCOL 3350 17 GM/SCOOP PO POWD: 1.0000 | Freq: Two times a day (BID) | ORAL | 3 refills | Status: AC

## 2023-03-26 NOTE — H&P (View-Only) (Signed)
[name] , M.D. Gastroenterology & Hepatology [facility] [address] [place], [place] [address] Primary Care Physician: [name], MD [address] [place] [address]  Referring MD: Dr Gloris Manchester  Chief Complaint:  unintentional weight loss, dysphagia and constipation .   History of Present Illness:  Shawn Lopez is a 82 y.o. male with  HTN, HLD, DM, chronic pain, CAD s/p PCI 2018 on DAPT who presents for evaluation for unintentional weight loss, dysphagia and constipation .  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  He has significant cardiac comorbidities although appears to have good exercise tolerance as he can walk 4 flights of stairs without getting short of breath  Patient main complaint today is unintentional weight loss and constipation.  Patient reports that he has a bowel movement every 3 to 5 days which alternates between hard stools to loose stools, he has to occasionally strain.  Denies any hematochezia  Patient reports that he was 223 pounds 3 years ago and now 180 pounds today.  Upon chart review he ever appears to have lost 20 pounds in 5 years.  Patient reports that 4 months ago he started to have solid food dysphagia, describing as food getting stuck in the middle of the chest which is now better with PPI.  Last UJW:JXBJ Last Colonoscopy:2014 Diverticulosis , repeat 10 years   FHx: neg for any gastrointestinal/liver disease, no malignancies Social: Ex-Smoker,   CT Chest 2024   IMPRESSION: 1. No acute pulmonary embolism. 2. Cardiomegaly with minimal interlobular septal thickening suggesting mild pulmonary edema.  CT Abdomen and pelvis 2022   IMPRESSION: 1. No acute intra-abdominal process. 2. Aortic Atherosclerosis (ICD10-I70.0).    Past Medical History: Past Medical History:  Diagnosis Date   Arthritis    Atrial  fibrillation (HCC) 2006   a. in 2006; not on anticoagulation, no recent atrial fib seen as of 2018.   Borderline hypertension    CAD (coronary artery disease)    a. NSTEMI 12/2016: cath showing 95% mid-LAD stenosis, 75% prox-LAD stenosis and 75% 1st Diag stenosis. DES to the proximal and mid-LAD along with the 1st Diagonal..   Chronic back pain    Prior trauma with vertebral fracture   Hyperlipidemia    Hypertension    Osteopenia    Sinus bradycardia    Type 2 diabetes mellitus (HCC)     Past Surgical History: Past Surgical History:  Procedure Laterality Date   APPENDECTOMY  1960   CATARACT EXTRACTION  2012   right eye,lens implantation   CHOLECYSTECTOMY     CIRCUMCISION  2010   meatotomy and dilatation   COLONOSCOPY N/A 01/14/2013   Procedure: COLONOSCOPY;  Surgeon: Dalia Heading, MD;  [facility];  Service: Gastroenterology;  Laterality: N/A;   CORONARY ANGIOGRAPHY N/A 01/15/2017   Procedure: Coronary Angiography;  Surgeon: Runell Gess, MD;  Location: [facility] INVASIVE CV LAB;  Service: Cardiovascular;  Laterality: N/A;   CORONARY PRESSURE/FFR STUDY N/A 04/04/2019   Procedure: INTRAVASCULAR PRESSURE WIRE/FFR STUDY;  Surgeon: Yvonne Kendall, MD;  Location: [facility] INVASIVE CV LAB;  Service: Cardiovascular;  Laterality: N/A;   CORONARY STENT INTERVENTION N/A 01/15/2017   Procedure: Coronary Stent Intervention;  Surgeon: Runell Gess, MD;  Location: [facility] INVASIVE CV LAB;  Service: Cardiovascular;  Laterality: N/A;   LEFT HEART CATH AND CORONARY ANGIOGRAPHY N/A 04/04/2019   Procedure: LEFT HEART CATH AND CORONARY  ANGIOGRAPHY;  Surgeon: Yvonne Kendall, MD;  Location: [facility] INVASIVE CV LAB;  Service: Cardiovascular;  Laterality: N/A;    Family History: Family History  Problem Relation Age of Onset   CAD Father    Heart attack Brother     Social History: Social History   Tobacco Use  Smoking Status Former   Packs/day: 1.50   Years: 15.00   Additional pack years: 0.00    Total pack years: 22.50   Types: Cigarettes   Quit date: 04/20/1997   Years since quitting: 25.9   Passive exposure: Past  Smokeless Tobacco Never   Social History   Substance and Sexual Activity  Alcohol Use No   Social History   Substance and Sexual Activity  Drug Use No    Allergies: Allergies  Allergen Reactions   Brilinta [Ticagrelor]     Dyspnea    Other Other (See Comments)    Pt states antibiotics give him the shakes    Medications: Current Outpatient Medications  Medication Sig Dispense Refill   aspirin EC 81 MG EC tablet Take 1 tablet (81 mg total) by mouth daily. 30 tablet 0   atorvastatin (LIPITOR) 80 MG tablet Take 1 tablet (80 mg total) by mouth daily at 6 PM. 90 tablet 3   clopidogrel (PLAVIX) 75 MG tablet Take 1 tablet (75 mg total) by mouth daily. 90 tablet 3   glimepiride (AMARYL) 2 MG tablet Take 1 mg by mouth daily.     HYDROcodone-acetaminophen (NORCO) 10-325 MG tablet Take 1 tablet by mouth every 4 (four) hours as needed for moderate pain or severe pain.     lisinopril (ZESTRIL) 10 MG tablet Take 10 mg by mouth daily.     nitroGLYCERIN (NITROSTAT) 0.4 MG SL tablet Place 1 tablet (0.4 mg total) under the tongue every 5 (five) minutes as needed for chest pain. For 3 doses only 30 tablet 0   pantoprazole (PROTONIX) 40 MG tablet Take 40 mg by mouth daily.     polyethylene glycol powder (GLYCOLAX/MIRALAX) 17 GM/SCOOP powder Take 1 Container by mouth once. As needed if not had BM in 4 days     No current facility-administered medications for this visit.    Review of Systems: GENERAL: negative for malaise, night sweats HEENT: No changes in hearing or vision, no nose bleeds or other nasal problems. NECK: Negative for lumps, goiter, pain and significant neck swelling RESPIRATORY: Negative for cough, wheezing CARDIOVASCULAR: Negative for  leg swelling, palpitations, orthopnea GI: SEE HPI MUSCULOSKELETAL: Negative for joint pain or swelling, back pain,  and muscle pain. SKIN: Negative for lesions, rash NEURO: negative for tremor, gait imbalance, syncope and seizures. The remainder of the review of systems is noncontributory.   Physical Exam: BP (!) 144/73   Pulse (!) 58   Temp 98.1 F (36.7 C) (Oral)   Ht 5' 10.5" (1.791 m)   Wt 180 lb 11.2 oz (82 kg)   BMI 25.56 kg/m  GENERAL: The patient is AO x3, in no acute distress. HEENT: Head is normocephalic and atraumatic. EOMI are intact. Mouth is well hydrated and without lesions. NECK: Supple. No masses LUNGS: Clear to auscultation. No presence of rhonchi/wheezing/rales. Adequate chest expansion HEART: RRR, normal s1 and s2. ABDOMEN: Soft, nontender, no guarding, no peritoneal signs, and nondistended. BS +. No masses. EXTREMITIES: Without any cyanosis, clubbing, rash, lesions or edema. NEUROLOGIC: AOx3, no focal motor deficit. SKIN: no jaundice, no rashes   Imaging/Labs: as above  I personally reviewed and interpreted the available  labs, imaging and endoscopic files.  Impression and Plan:  Shawn Lopez is a 82 y.o. male with  HTN, HLD, DM, chronic pain, CAD s/p PCI 2018 on DAPT who presents for evaluation for unintentional weight loss, dysphagia and constipation .  #Solid food Dysphagia  This could be due to underlying GERD as solid food dysphagia improved after PPI.   Although given patient is of advance age with history of smoking exposure , malignancy needs to be ruled out   Ideal modality is Upper endoscopy .   Although patient has had 4 ED visit this year for chest pain and pre syncope where serial trops, EKG, CT PE , Nuclear stress test has been negative . Patient has ZIO patch currently is planned for follow up with cardiology .  Would request cardiac clearance before proceeding with EGD and if plavix can be held for 5 days prior to the procedure   #Constipation   Patient has alternating BM with diarrhea ( likley overflow diarrhea) and constipation   Last  colonoscopy in 2014 with suggested repeat in 10 years which is now  Given advance age and new constipation , diagnostic colonoscopy is also indicated  Would request cardiac clearance before proceeding with Colonoscopy and if plavix can be held for 5 days prior to the procedure   Will start with Miralax BID with high fiber diet .  # Unintentional weight loss  Patient reports that he was 223 pounds 3 years ago and now 180 pounds today.  Upon chart review he ever appears to have lost 20 pounds in 5 years.  CT Abdomen pelvis 2022 and CT Chest 2024 without any overt mas or malignancy   EGD and colonoscopy is indicated , after cardiology clearance as above  All questions were answered.      [name], MD Gastroenterology and Hepatology [facility]

## 2023-03-26 NOTE — Progress Notes (Signed)
Shawn Lopez , M.D. Gastroenterology & Hepatology [facility] [address] [place], [place] [address] Primary Care Physician: Shawn Morgan, MD [address] [place] [address]  Referring MD: Dr Shawn Lopez  Chief Complaint:  unintentional weight loss, dysphagia and constipation .   History of Present Illness:  Shawn Lopez is a 82 y.o. male with  HTN, HLD, DM, chronic pain, CAD s/p PCI 2018 on DAPT who presents for evaluation for unintentional weight loss, dysphagia and constipation .  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  He has significant cardiac comorbidities although appears to have good exercise tolerance as he can walk 4 flights of stairs without getting short of breath  Patient main complaint today is unintentional weight loss and constipation.  Patient reports that he has a bowel movement every 3 to 5 days which alternates between hard stools to loose stools, he has to occasionally strain.  Denies any hematochezia  Patient reports that he was 223 pounds 3 years ago and now 180 pounds today.  Upon chart review he ever appears to have lost 20 pounds in 5 years.  Patient reports that 4 months ago he started to have solid food dysphagia, describing as food getting stuck in the middle of the chest which is now better with PPI.  Last UJW:JXBJ Last Colonoscopy:2014 Diverticulosis , repeat 10 years   FHx: neg for any gastrointestinal/liver disease, no malignancies Social: Ex-Smoker,   CT Chest 2024   IMPRESSION: 1. No acute pulmonary embolism. 2. Cardiomegaly with minimal interlobular septal thickening suggesting mild pulmonary edema.  CT Abdomen and pelvis 2022   IMPRESSION: 1. No acute intra-abdominal process. 2. Aortic Atherosclerosis (ICD10-I70.0).    Past Medical History: Past Medical History:  Diagnosis Date   Arthritis    Atrial  fibrillation (HCC) 2006   a. in 2006; not on anticoagulation, no recent atrial fib seen as of 2018.   Borderline hypertension    CAD (coronary artery disease)    a. NSTEMI 12/2016: cath showing 95% mid-LAD stenosis, 75% prox-LAD stenosis and 75% 1st Diag stenosis. DES to the proximal and mid-LAD along with the 1st Diagonal..   Chronic back pain    Prior trauma with vertebral fracture   Hyperlipidemia    Hypertension    Osteopenia    Sinus bradycardia    Type 2 diabetes mellitus (HCC)     Past Surgical History: Past Surgical History:  Procedure Laterality Date   APPENDECTOMY  1960   CATARACT EXTRACTION  2012   right eye,lens implantation   CHOLECYSTECTOMY     CIRCUMCISION  2010   meatotomy and dilatation   COLONOSCOPY N/A 01/14/2013   Procedure: COLONOSCOPY;  Surgeon: Dalia Heading, MD;  [facility];  Service: Gastroenterology;  Laterality: N/A;   CORONARY ANGIOGRAPHY N/A 01/15/2017   Procedure: Coronary Angiography;  Surgeon: Runell Gess, MD;  Location: [facility] INVASIVE CV LAB;  Service: Cardiovascular;  Laterality: N/A;   CORONARY PRESSURE/FFR STUDY N/A 04/04/2019   Procedure: INTRAVASCULAR PRESSURE WIRE/FFR STUDY;  Surgeon: Yvonne Kendall, MD;  Location: [facility] INVASIVE CV LAB;  Service: Cardiovascular;  Laterality: N/A;   CORONARY STENT INTERVENTION N/A 01/15/2017   Procedure: Coronary Stent Intervention;  Surgeon: Runell Gess, MD;  Location: [facility] INVASIVE CV LAB;  Service: Cardiovascular;  Laterality: N/A;   LEFT HEART CATH AND CORONARY ANGIOGRAPHY N/A 04/04/2019   Procedure: LEFT HEART CATH AND CORONARY  ANGIOGRAPHY;  Surgeon: Yvonne Kendall, MD;  Location: [facility] INVASIVE CV LAB;  Service: Cardiovascular;  Laterality: N/A;    Family History: Family History  Problem Relation Age of Onset   CAD Father    Heart attack Brother     Social History: Social History   Tobacco Use  Smoking Status Former   Packs/day: 1.50   Years: 15.00   Additional pack years: 0.00    Total pack years: 22.50   Types: Cigarettes   Quit date: 04/20/1997   Years since quitting: 25.9   Passive exposure: Past  Smokeless Tobacco Never   Social History   Substance and Sexual Activity  Alcohol Use No   Social History   Substance and Sexual Activity  Drug Use No    Allergies: Allergies  Allergen Reactions   Brilinta [Ticagrelor]     Dyspnea    Other Other (See Comments)    Pt states antibiotics give him the shakes    Medications: Current Outpatient Medications  Medication Sig Dispense Refill   aspirin EC 81 MG EC tablet Take 1 tablet (81 mg total) by mouth daily. 30 tablet 0   atorvastatin (LIPITOR) 80 MG tablet Take 1 tablet (80 mg total) by mouth daily at 6 PM. 90 tablet 3   clopidogrel (PLAVIX) 75 MG tablet Take 1 tablet (75 mg total) by mouth daily. 90 tablet 3   glimepiride (AMARYL) 2 MG tablet Take 1 mg by mouth daily.     HYDROcodone-acetaminophen (NORCO) 10-325 MG tablet Take 1 tablet by mouth every 4 (four) hours as needed for moderate pain or severe pain.     lisinopril (ZESTRIL) 10 MG tablet Take 10 mg by mouth daily.     nitroGLYCERIN (NITROSTAT) 0.4 MG SL tablet Place 1 tablet (0.4 mg total) under the tongue every 5 (five) minutes as needed for chest pain. For 3 doses only 30 tablet 0   pantoprazole (PROTONIX) 40 MG tablet Take 40 mg by mouth daily.     polyethylene glycol powder (GLYCOLAX/MIRALAX) 17 GM/SCOOP powder Take 1 Container by mouth once. As needed if not had BM in 4 days     No current facility-administered medications for this visit.    Review of Systems: GENERAL: negative for malaise, night sweats HEENT: No changes in hearing or vision, no nose bleeds or other nasal problems. NECK: Negative for lumps, goiter, pain and significant neck swelling RESPIRATORY: Negative for cough, wheezing CARDIOVASCULAR: Negative for  leg swelling, palpitations, orthopnea GI: SEE HPI MUSCULOSKELETAL: Negative for joint pain or swelling, back pain,  and muscle pain. SKIN: Negative for lesions, rash NEURO: negative for tremor, gait imbalance, syncope and seizures. The remainder of the review of systems is noncontributory.   Physical Exam: BP (!) 144/73   Pulse (!) 58   Temp 98.1 F (36.7 C) (Oral)   Ht 5' 10.5" (1.791 m)   Wt 180 lb 11.2 oz (82 kg)   BMI 25.56 kg/m  GENERAL: The patient is AO x3, in no acute distress. HEENT: Head is normocephalic and atraumatic. EOMI are intact. Mouth is well hydrated and without lesions. NECK: Supple. No masses LUNGS: Clear to auscultation. No presence of rhonchi/wheezing/rales. Adequate chest expansion HEART: RRR, normal s1 and s2. ABDOMEN: Soft, nontender, no guarding, no peritoneal signs, and nondistended. BS +. No masses. EXTREMITIES: Without any cyanosis, clubbing, rash, lesions or edema. NEUROLOGIC: AOx3, no focal motor deficit. SKIN: no jaundice, no rashes   Imaging/Labs: as above  I personally reviewed and interpreted the available  labs, imaging and endoscopic files.  Impression and Plan:  Shawn Lopez is a 82 y.o. male with  HTN, HLD, DM, chronic pain, CAD s/p PCI 2018 on DAPT who presents for evaluation for unintentional weight loss, dysphagia and constipation .  #Solid food Dysphagia  This could be due to underlying GERD as solid food dysphagia improved after PPI.   Although given patient is of advance age with history of smoking exposure , malignancy needs to be ruled out   Ideal modality is Upper endoscopy .   Although patient has had 4 ED visit this year for chest pain and pre syncope where serial trops, EKG, CT PE , Nuclear stress test has been negative . Patient has ZIO patch currently is planned for follow up with cardiology .  Would request cardiac clearance before proceeding with EGD and if plavix can be held for 5 days prior to the procedure   #Constipation   Patient has alternating BM with diarrhea ( likley overflow diarrhea) and constipation   Last  colonoscopy in 2014 with suggested repeat in 10 years which is now  Given advance age and new constipation , diagnostic colonoscopy is also indicated  Would request cardiac clearance before proceeding with Colonoscopy and if plavix can be held for 5 days prior to the procedure   Will start with Miralax BID with high fiber diet .  # Unintentional weight loss  Patient reports that he was 223 pounds 3 years ago and now 180 pounds today.  Upon chart review he ever appears to have lost 20 pounds in 5 years.  CT Abdomen pelvis 2022 and CT Chest 2024 without any overt mas or malignancy   EGD and colonoscopy is indicated , after cardiology clearance as above  All questions were answered.      Shawn Lawman, MD Gastroenterology and Hepatology [facility]

## 2023-03-26 NOTE — Patient Instructions (Signed)
It was nice to meet you!  1) We recommend upper endoscopy and colonoscopy after cardiology clearance for the procedure, and if plavix can be held for 5 days prior to the procedure   2) for constipation please start Miralax 17gm BID with high fiber diet

## 2023-03-26 NOTE — Addendum Note (Signed)
Addended by: [name] on: 03/26/2023 02:50 PM   Modules accepted: Orders

## 2023-03-27 ENCOUNTER — Telehealth (INDEPENDENT_AMBULATORY_CARE_PROVIDER_SITE_OTHER): Payer: Self-pay | Admitting: Gastroenterology

## 2023-03-27 MED ORDER — PEG 3350-KCL-NA BICARB-NACL 420 G PO SOLR: 4000.0000 mL | Freq: Once | ORAL | 0 refills | Status: AC

## 2023-03-27 NOTE — Telephone Encounter (Signed)
Pt in office yesterday and needing EGD/TCS scheduled. Contacted pt and pt scheduled for 04/20/23 with Dr.Ahmed. Pt will need cardiac clearance and clearance to hold Plavix. Clearance sent to pool. Will send instructions once clearance received. (Pt has cardiology appt on 04/13/23)

## 2023-03-27 NOTE — Telephone Encounter (Signed)
    03/27/23  Shawn Lopez 01-03-1941  What type of surgery is being performed? EGD/COLONOSCOPY  When is surgery scheduled? 04/20/23  What type of clearance is required (medical or pharmacy to hold medication or both? CARDIAC CLEARANCE  Are there any medications that need to be held prior to surgery and how long? CLEARANCE TO HOLD PLAVIX X 5 DAYS PRIOR  Name of physician performing surgery?  Dr. Salome Spotted [facility] at [facility] Phone: [phone] Fax: [phone]  Anethesia type (none, local, MAC, general)? MAC

## 2023-03-27 NOTE — Telephone Encounter (Signed)
Per [employer] Approved Authorization #[id number]  Tracking [id number]  Dates of service 04/20/2023 - 06/27/2023

## 2023-03-27 NOTE — Telephone Encounter (Signed)
   Name: Shawn Lopez  DOB: 1941/08/20  MRN: [id number]  Primary Cardiologist: [name], MD  Chart reviewed as part of pre-operative protocol coverage. The patient has an upcoming visit scheduled with Dr. [name] on 04/13/2023 at which time clearance can be addressed in case there are any issues that would impact surgical recommendations.  I added preop FYI to appointment note so that provider is aware to address at time of outpatient visit.  Per office protocol the cardiology provider should forward their finalized clearance decision and recommendations regarding antiplatelet therapy to the requesting party below.     I will route this message as FYI to requesting party and remove this message from the preop box as separate preop APP input not needed at this time.   Please call with any questions.  Napoleon Form, Leodis Rains, NP  03/27/2023, 8:56 AM

## 2023-04-13 ENCOUNTER — Encounter: Payer: Self-pay | Admitting: Cardiology

## 2023-04-13 ENCOUNTER — Ambulatory Visit: Payer: Medicare HMO | Attending: Cardiology | Admitting: Cardiology

## 2023-04-13 VITALS — BP 124/68 | HR 52 | Ht 70.0 in | Wt 181.6 lb

## 2023-04-13 DIAGNOSIS — R001 Bradycardia, unspecified: Secondary | ICD-10-CM

## 2023-04-13 DIAGNOSIS — Z01810 Encounter for preprocedural cardiovascular examination: Secondary | ICD-10-CM | POA: Diagnosis not present

## 2023-04-13 DIAGNOSIS — E782 Mixed hyperlipidemia: Secondary | ICD-10-CM | POA: Diagnosis not present

## 2023-04-13 DIAGNOSIS — I25119 Atherosclerotic heart disease of native coronary artery with unspecified angina pectoris: Secondary | ICD-10-CM

## 2023-04-13 NOTE — Patient Instructions (Signed)
Medication Instructions:  ?Your physician recommends that you continue on your current medications as directed. Please refer to the Current Medication list given to you today. ? ? ?Labwork: ?None today ? ?Testing/Procedures: ?None today ? ?Follow-Up: ?3 months ? ?Any Other Special Instructions Will Be Listed Below (If Applicable). ? ?If you need a refill on your cardiac medications before your next appointment, please call your pharmacy. ? ?

## 2023-04-13 NOTE — Progress Notes (Signed)
Cardiology Office Note  Date: 04/13/2023   ID: Dayvian, Blixt [birth year], MRN [id number]  History of Present Illness: SUKHRAJ ESQUIVIAS is an 82 y.o. male last seen in June. He is here for a follow-up visit pending EGD/colonoscopy under MAC with Dr. Tasia Catchings next Friday. From a cardiac perspective he has been doing well, no further spells as discussed in the last note and no angina on medical therapy. He still works as before, no recent change in stamina and no syncope.  Recent cardiac monitor is reviewed below. Average heart rate is in the 50's, importantly no pauses or heart block noted. Brief episodes of PSVT noted that are not clearly symptomatic.  We reviewed his medications. It is requested that he hold Plavix 5 days prior to endoscopy.  Physical Exam: VS:  BP 124/68 (BP Location: Left Arm, Patient Position: Sitting, Cuff Size: Normal)   Pulse (!) 52   Ht 5\' 10"  (1.778 m)   Wt 181 lb 9.6 oz (82.4 kg)   SpO2 96%   BMI 26.06 kg/m , BMI Body mass index is 26.06 kg/m.  Wt Readings from Last 3 Encounters:  04/13/23 181 lb 9.6 oz (82.4 kg)  03/26/23 180 lb 11.2 oz (82 kg)  03/14/23 185 lb (83.9 kg)    General: Patient appears comfortable at rest. HEENT: Conjunctiva and lids normal. Neck: Supple, no elevated JVP or carotid bruits. Lungs: Clear to auscultation, nonlabored breathing at rest. Cardiac: Regular rate and rhythm, no S3, 1/6 systolic murmur. Extremities: No pitting edema.  ECG:  An ECG dated 12/06/2022 was personally reviewed today and demonstrated:  Sinus bradycardia.  Labwork: 11/23/2022: B Natriuretic Peptide 39.0 11/24/2022: Magnesium 2.0 12/06/2022: ALT 25; AST 24 03/12/2023: BUN 16; Creatinine, Ser 0.92; Hemoglobin 15.0; Platelets 182; Potassium 4.0; Sodium 135     Component Value Date/Time   CHOL 108 11/24/2022 0710   CHOL 115 10/14/2021 0914   TRIG 68 11/24/2022 0710   HDL 38 (L) 11/24/2022 0710   HDL 40 10/14/2021 0914   CHOLHDL 2.8 11/24/2022  0710   VLDL 14 11/24/2022 0710   LDLCALC 56 11/24/2022 0710   LDLCALC 57 10/14/2021 0914   Other Studies Reviewed Today:  Lexiscan Myoview 11/24/2022:   The study is normal. There are no perfusion defects The study is low risk.   No ST deviation was noted.   LV perfusion is normal.   Left ventricular function is normal. Nuclear stress EF: 64 %. The left ventricular ejection fraction is normal (55-65%). End diastolic cavity size is normal.  Cardiac monitor 04/05/2023: ZIO monitor reviewed.  13 days, 22 hours analyzed.   Predominant rhythm is sinus with heart rate ranging from 41 bpm up to 106 bpm and average heart rate 53 bpm. There were rare PACs including atrial couplets and triplets representing less than 1% total beats. There were rare PVCs including ventricular couplets representing less than 1% total beats. Several very brief episodes of SVT were noted, the longest of which was only 6 beats. No sustained arrhythmias, pauses, or heart block.  Assessment and Plan:  1.  Prior episodic lightheadedness without syncope. 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 No pauses or heart block by recent cardiac monitor. Continue with observation for now.   2.  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  Follow-up cardiac catheterization  in July 2020 demonstrated patent LAD stent sites with moderate in-stent restenosis in the mid LAD segment as well as widely patent first diagonal stent with moderate stenosis proximal to the stent.  Recent Lexiscan Myoview in March showed no active ischemia with LVEF 64%.  Currently on aspirin, Plavix, Lipitor, lisinopril, and as needed nitroglycerin. No denies any worsening angina.   3.  Essential hypertension.  Blood pressure well controlled today..   4.  Mixed hyperlipidemia.  He remains on Lipitor 80 mg  daily with LDL 56 in March.  5.  Pending EGD/colonoscopy under MAC next Friday. No absolute cardiac contraindication to proceed, would observe on telemetry throughout given bradycardia at baseline. May hold Plavix 5 days prior.   Disposition:  Follow up  3 months.  Signed, Jonelle Sidle, M.D., F.A.C.C. [facility]

## 2023-04-17 ENCOUNTER — Encounter (HOSPITAL_COMMUNITY)
Admission: RE | Admit: 2023-04-17 | Discharge: 2023-04-17 | Disposition: A | Discharge status: Home / Self Care | Payer: Medicare HMO | Source: Ambulatory Visit | Attending: Gastroenterology | Admitting: Gastroenterology

## 2023-04-17 ENCOUNTER — Encounter (INDEPENDENT_AMBULATORY_CARE_PROVIDER_SITE_OTHER): Payer: Self-pay

## 2023-04-17 ENCOUNTER — Encounter (HOSPITAL_COMMUNITY): Payer: Self-pay

## 2023-04-17 ENCOUNTER — Other Ambulatory Visit: Payer: Self-pay

## 2023-04-17 NOTE — Telephone Encounter (Signed)
FYI Pt came into office for instructions. Pt saw cardiology on 04/13/23 and they stated it would be ok to hold plavix fro 5 days. Pt stopped Plavix on Sunday 04/15/23.  Per cardiology: 5.  Pending EGD/colonoscopy under MAC next Friday. No absolute cardiac contraindication to proceed, would observe on telemetry throughout given bradycardia at baseline. May hold Plavix 5 days prior.   Instructions printed and given to patient. Pt states that he has had 5 text and 4 calls from the hospital, all of them were different. Pt states that they told him to be at hospital at 6:30am on Friday morning.

## 2023-04-20 ENCOUNTER — Ambulatory Visit (HOSPITAL_BASED_OUTPATIENT_CLINIC_OR_DEPARTMENT_OTHER): Payer: Medicare HMO | Admitting: Anesthesiology

## 2023-04-20 ENCOUNTER — Ambulatory Visit (HOSPITAL_COMMUNITY)
Admission: RE | Admit: 2023-04-20 | Discharge: 2023-04-20 | Disposition: A | Discharge status: Home / Self Care | Payer: Medicare HMO | Attending: Gastroenterology | Admitting: Gastroenterology

## 2023-04-20 ENCOUNTER — Ambulatory Visit (HOSPITAL_COMMUNITY): Payer: Medicare HMO | Admitting: Anesthesiology

## 2023-04-20 ENCOUNTER — Encounter (HOSPITAL_COMMUNITY)
Admission: RE | Disposition: A | Discharge status: Home / Self Care | Payer: Self-pay | Source: Home / Self Care | Attending: Gastroenterology

## 2023-04-20 ENCOUNTER — Encounter (HOSPITAL_COMMUNITY): Payer: Self-pay

## 2023-04-20 DIAGNOSIS — I10 Essential (primary) hypertension: Secondary | ICD-10-CM | POA: Diagnosis not present

## 2023-04-20 DIAGNOSIS — I25119 Atherosclerotic heart disease of native coronary artery with unspecified angina pectoris: Secondary | ICD-10-CM | POA: Insufficient documentation

## 2023-04-20 DIAGNOSIS — Z87891 Personal history of nicotine dependence: Secondary | ICD-10-CM

## 2023-04-20 DIAGNOSIS — D123 Benign neoplasm of transverse colon: Secondary | ICD-10-CM | POA: Diagnosis not present

## 2023-04-20 DIAGNOSIS — R1310 Dysphagia, unspecified: Secondary | ICD-10-CM | POA: Diagnosis not present

## 2023-04-20 DIAGNOSIS — E119 Type 2 diabetes mellitus without complications: Secondary | ICD-10-CM | POA: Insufficient documentation

## 2023-04-20 DIAGNOSIS — K5900 Constipation, unspecified: Secondary | ICD-10-CM | POA: Insufficient documentation

## 2023-04-20 DIAGNOSIS — I252 Old myocardial infarction: Secondary | ICD-10-CM | POA: Insufficient documentation

## 2023-04-20 DIAGNOSIS — K31A19 Gastric intestinal metaplasia without dysplasia, unspecified site: Secondary | ICD-10-CM | POA: Insufficient documentation

## 2023-04-20 DIAGNOSIS — K2950 Unspecified chronic gastritis without bleeding: Secondary | ICD-10-CM | POA: Insufficient documentation

## 2023-04-20 DIAGNOSIS — Z1211 Encounter for screening for malignant neoplasm of colon: Secondary | ICD-10-CM

## 2023-04-20 DIAGNOSIS — D124 Benign neoplasm of descending colon: Secondary | ICD-10-CM

## 2023-04-20 DIAGNOSIS — Z955 Presence of coronary angioplasty implant and graft: Secondary | ICD-10-CM | POA: Diagnosis not present

## 2023-04-20 DIAGNOSIS — K644 Residual hemorrhoidal skin tags: Secondary | ICD-10-CM | POA: Insufficient documentation

## 2023-04-20 DIAGNOSIS — K5730 Diverticulosis of large intestine without perforation or abscess without bleeding: Secondary | ICD-10-CM | POA: Insufficient documentation

## 2023-04-20 DIAGNOSIS — K635 Polyp of colon: Secondary | ICD-10-CM

## 2023-04-20 DIAGNOSIS — B9681 Helicobacter pylori [H. pylori] as the cause of diseases classified elsewhere: Secondary | ICD-10-CM

## 2023-04-20 DIAGNOSIS — K648 Other hemorrhoids: Secondary | ICD-10-CM | POA: Insufficient documentation

## 2023-04-20 DIAGNOSIS — K3189 Other diseases of stomach and duodenum: Secondary | ICD-10-CM | POA: Insufficient documentation

## 2023-04-20 DIAGNOSIS — K2970 Gastritis, unspecified, without bleeding: Secondary | ICD-10-CM | POA: Diagnosis not present

## 2023-04-20 DIAGNOSIS — Z7902 Long term (current) use of antithrombotics/antiplatelets: Secondary | ICD-10-CM | POA: Diagnosis not present

## 2023-04-20 HISTORY — PX: ESOPHAGOGASTRODUODENOSCOPY (EGD) WITH PROPOFOL: SHX5813

## 2023-04-20 HISTORY — PX: COLONOSCOPY WITH PROPOFOL: SHX5780

## 2023-04-20 HISTORY — PX: POLYPECTOMY: SHX5525

## 2023-04-20 HISTORY — PX: BIOPSY: SHX5522

## 2023-04-20 LAB — GLUCOSE, CAPILLARY: Glucose-Capillary: 115 mg/dL — ABNORMAL HIGH (ref 70–99)

## 2023-04-20 LAB — HM COLONOSCOPY

## 2023-04-20 SURGERY — COLONOSCOPY WITH PROPOFOL
Anesthesia: General

## 2023-04-20 MED ORDER — EPHEDRINE SULFATE (PRESSORS) 50 MG/ML IJ SOLN
INTRAMUSCULAR | Status: DC | PRN
  Administered 2023-04-20: 5 mg via INTRAVENOUS
  Administered 2023-04-20: 10 mg via INTRAVENOUS

## 2023-04-20 MED ORDER — PROPOFOL 10 MG/ML IV BOLUS
INTRAVENOUS | Status: DC | PRN
Start: 2023-04-20 — End: 2023-04-20
  Administered 2023-04-20 (×2): 50 mg via INTRAVENOUS
  Administered 2023-04-20 (×2): 30 mg via INTRAVENOUS
  Administered 2023-04-20: 50 mg via INTRAVENOUS
  Administered 2023-04-20: 40 mg via INTRAVENOUS
  Administered 2023-04-20: 30 mg via INTRAVENOUS
  Administered 2023-04-20 (×2): 50 mg via INTRAVENOUS
  Administered 2023-04-20: 20 mg via INTRAVENOUS
  Administered 2023-04-20: 120 mg via INTRAVENOUS
  Administered 2023-04-20: 30 mg via INTRAVENOUS

## 2023-04-20 MED ORDER — LACTATED RINGERS IV SOLN: INTRAVENOUS | Status: DC

## 2023-04-20 NOTE — Discharge Instructions (Signed)

## 2023-04-20 NOTE — Anesthesia Preprocedure Evaluation (Signed)
Anesthesia Evaluation  Patient identified by MRN, date of birth, ID band Patient awake    Reviewed: Allergy & Precautions, H&P , NPO status , Patient's Chart, lab work & pertinent test results, reviewed documented beta blocker date and time   Airway Mallampati: II  TM Distance: >3 FB Neck ROM: full    Dental no notable dental hx.    Pulmonary neg pulmonary ROS, former smoker   Pulmonary exam normal breath sounds clear to auscultation       Cardiovascular Exercise Tolerance: Good hypertension, + angina  + CAD, + Past MI and + Cardiac Stents   Rhythm:regular Rate:Normal     Neuro/Psych  Neuromuscular disease negative neurological ROS  negative psych ROS   GI/Hepatic negative GI ROS, Neg liver ROS,,,  Endo/Other  negative endocrine ROSdiabetes    Renal/GU negative Renal ROS  negative genitourinary   Musculoskeletal   Abdominal   Peds  Hematology negative hematology ROS (+)   Anesthesia Other Findings   Reproductive/Obstetrics negative OB ROS                             Anesthesia Physical Anesthesia Plan  ASA: 3  Anesthesia Plan: General   Post-op Pain Management:    Induction:   PONV Risk Score and Plan: Propofol infusion  Airway Management Planned:   Additional Equipment:   Intra-op Plan:   Post-operative Plan:   Informed Consent: I have reviewed the patients History and Physical, chart, labs and discussed the procedure including the risks, benefits and alternatives for the proposed anesthesia with the patient or authorized representative who has indicated his/her understanding and acceptance.     Dental Advisory Given  Plan Discussed with: CRNA  Anesthesia Plan Comments:        Anesthesia Quick Evaluation

## 2023-04-20 NOTE — Interval H&P Note (Signed)
History and Physical Interval Note:  04/20/2023 8:34 AM  Shawn Lopez  has presented today for surgery, with the diagnosis of unintentional weight loss, constipation.  The various methods of treatment have been discussed with the patient and family. After consideration of risks, benefits and other options for treatment, the patient has consented to  Procedure(s) with comments: COLONOSCOPY WITH PROPOFOL (N/A) - 7:30am;asa 3 ESOPHAGOGASTRODUODENOSCOPY (EGD) WITH PROPOFOL (N/A) - 7:30am;asa 3 as a surgical intervention.  The patient's history has been reviewed, patient examined, no change in status, stable for surgery.  I have reviewed the patient's chart and labs.  Questions were answered to the patient's satisfaction.     [name]

## 2023-04-20 NOTE — Op Note (Addendum)
[facility] Patient Name: Shawn Lopez Procedure Date: 04/20/2023 7:48 AM MRN: [id number] Date of Birth: February 24, 1941 Attending MD: Sanjuan Dame , MD, [id number] CSN: [id number] Age: 82 Admit Type: Outpatient Procedure:                Upper GI endoscopy Indications:              Dysphagia Providers:                Sanjuan Dame, MD, Sheran Fava, Nena Polio,                            RN, Dyann Ruddle, Elinor Parkinson, Lennice Sites                            Technician, Technician Referring MD:             Sanjuan Dame, MD Medicines:                Monitored Anesthesia Care Complications:            No immediate complications. Estimated Blood Loss:     Estimated blood loss: none. Procedure:                Pre-Anesthesia Assessment:                           - Prior to the procedure, a History and Physical                            was performed, and patient medications and                            allergies were reviewed. The patient's tolerance of                            previous anesthesia was also reviewed. The risks                            and benefits of the procedure and the sedation                            options and risks were discussed with the patient.                            All questions were answered, and informed consent                            was obtained. Prior Anticoagulants: The patient has                            taken Plavix (clopidogrel), last dose was 5 days                            prior to procedure. ASA Grade Assessment: III - A  patient with severe systemic disease. After                            reviewing the risks and benefits, the patient was                            deemed in satisfactory condition to undergo the                            procedure.                           After obtaining informed consent, the endoscope was                            passed under direct vision.  Throughout the                            procedure, the patient's blood pressure, pulse, and                            oxygen saturations were monitored continuously. The                            [id number]-H190 ([id number]) scope was introduced through the                            mouth, and advanced to the second part of duodenum.                            The upper GI endoscopy was accomplished without                            difficulty. The patient tolerated the procedure                            well. Scope In: 8:44:54 AM Scope Out: 8:51:54 AM Total Procedure Duration: 0 hours 7 minutes 0 seconds  Findings:      No endoscopic abnormality was evident in the esophagus to explain the       patient's complaint of dysphagia. Biopsies were obtained from the       proximal and distal esophagus with cold forceps for histology of       suspected eosinophilic esophagitis.      Diffuse erythematous mucosa without bleeding was found in the entire       examined stomach. Biopsies were taken with a cold forceps for histology.      The duodenal bulb and second portion of the duodenum were normal.      The Z-line was regular and was found 36 cm from the incisors. Impression:               - No endoscopic esophageal abnormality to explain                            patient's dysphagia.                           -  Erythematous mucosa in the stomach. Biopsied.                           - Normal duodenal bulb and second portion of the                            duodenum.                           - Z-line regular, 36 cm from the incisors.                           - Biopsies were taken with a cold forceps for                            evaluation of eosinophilic esophagitis. Moderate Sedation:      Per Anesthesia Care Recommendation:           - Patient has a contact number available for                            emergencies. The signs and symptoms of potential                            delayed  complications were discussed with the                            patient. Return to normal activities tomorrow.                            Written discharge instructions were provided to the                            patient.                           - Resume previous diet.                           - Continue present medications.                           - Await pathology results.                           -continue with protonix once a day Procedure Code(s):        --- Professional ---                           [phone], Esophagogastroduodenoscopy, flexible,                            transoral; with biopsy, single or multiple Diagnosis Code(s):        --- Professional ---                           R13.10, Dysphagia, unspecified  K31.89, Other diseases of stomach and duodenum CPT copyright 2022 American Medical Association. All rights reserved. The codes documented in this report are preliminary and upon coder review may  be revised to meet current compliance requirements. Sanjuan Dame, MD Sanjuan Dame, MD 04/20/2023 8:54:57 AM This report has been signed electronically. Number of Addenda: 0

## 2023-04-20 NOTE — Op Note (Addendum)
[facility] Patient Name: Shawn Lopez Procedure Date: 04/20/2023 7:46 AM MRN: [id number] Date of Birth: [birth year] Attending MD: Sanjuan Dame , MD, [id number] CSN: [id number] Age: 82 Admit Type: Outpatient Procedure:                Colonoscopy Indications:              Screening for colorectal malignant neoplasm Providers:                Sanjuan Dame, MD, Nena Polio, RN, Sheran Fava, Lennice Sites Technician, Technician,                            Dyann Ruddle, Elinor Parkinson Referring MD:             Sanjuan Dame, MD Medicines:                Monitored Anesthesia Care Complications:            No immediate complications. Estimated blood loss:                            Minimal. Estimated Blood Loss:     Estimated blood loss was minimal. Procedure:                Pre-Anesthesia Assessment:                           - Prior to the procedure, a History and Physical                            was performed, and patient medications and                            allergies were reviewed. The patient's tolerance of                            previous anesthesia was also reviewed. The risks                            and benefits of the procedure and the sedation                            options and risks were discussed with the patient.                            All questions were answered, and informed consent                            was obtained. Prior Anticoagulants: The patient has                            taken Plavix (clopidogrel), last dose was 5 days  prior to procedure. ASA Grade Assessment: III - A                            patient with severe systemic disease. After                            reviewing the risks and benefits, the patient was                            deemed in satisfactory condition to undergo the                            procedure.                           After obtaining  informed consent, the colonoscope                            was passed under direct vision. Throughout the                            procedure, the patient's blood pressure, pulse, and                            oxygen saturations were monitored continuously. The                            [id number]) scope was introduced through the                            anus and advanced to the the cecum, identified by                            appendiceal orifice and ileocecal valve. The                            colonoscopy was performed without difficulty. The                            patient tolerated the procedure well. The quality                            of the bowel preparation was evaluated using the                            BBPS [facility] Bowel Preparation Scale) with scores                            of: Right Colon = 2 (minor amount of residual                            staining, small fragments of stool and/or opaque  liquid, but mucosa seen well), Transverse Colon = 2                            (minor amount of residual staining, small fragments                            of stool and/or opaque liquid, but mucosa seen                            well) and Left Colon = 2 (minor amount of residual                            staining, small fragments of stool and/or opaque                            liquid, but mucosa seen well). The total BBPS score                            equals 6. The ileocecal valve, appendiceal orifice,                            and rectum were photographed. Scope In: 8:56:37 AM Scope Out: 9:38:43 AM Scope Withdrawal Time: 0 hours 34 minutes 35 seconds  Total Procedure Duration: 0 hours 42 minutes 6 seconds  Findings:      The perianal and digital rectal examinations were normal.      Ten sessile polyps were found in the descending colon and transverse       colon. The polyps were 2 to 9 mm in size. These polyps were  removed with       a cold snare. Resection and retrieval were complete. Estimated blood       loss was minimal.      A 4 mm polyp was found in the transverse colon. The polyp was sessile.       The polyp was removed with a jumbo cold forceps. Resection and retrieval       were complete. For hemostasis, one hemostatic clip was successfully       placed (MR conditional). Clip manufacturer: [employer]. There was       no bleeding at the end of the procedure.      Non-bleeding external internal hemorrhoids were found during       retroflexion.      Scattered diverticula were found in the entire colon. Impression:               - Ten 2 to 9 mm polyps in the descending colon and                            in the transverse colon, removed with a cold snare.                            Resected and retrieved.                           - One 4 mm polyp in the transverse colon, removed  with a jumbo cold forceps. Resected and retrieved.                            Clip (MR conditional) was placed. 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                           - Non-bleeding external internal hemorrhoids.                           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 Moderate Sedation:      Per Anesthesia Care Recommendation:           - Repeat colonoscopy for surveillance based on                            pathology results.                           - Return to primary care physician as previously                            scheduled. Procedure Code(s):        --- Professional ---                           [phone], 59, Colonoscopy, flexible; with control of                            bleeding, any method                           45385, Colonoscopy, flexible; with removal of                            tumor(s), polyp(s), or other lesion(s) by snare                            technique Diagnosis Code(s):        --- Professional  ---                           Z12.11, Encounter for screening for malignant                            neoplasm of colon                           D12.4, Benign neoplasm of descending colon                           D12.3, Benign neoplasm of transverse colon (hepatic                            flexure or splenic flexure)  K64.4, Residual hemorrhoidal skin tags                           K64.8, Other hemorrhoids CPT copyright 2022 American Medical Association. All rights reserved. The codes documented in this report are preliminary and upon coder review may  be revised to meet current compliance requirements. Sanjuan Dame, MD Sanjuan Dame, MD 04/20/2023 9:46:20 AM This report has been signed electronically. Number of Addenda: 0

## 2023-04-20 NOTE — Transfer of Care (Signed)
Immediate Anesthesia Transfer of Care Note  Patient: Shawn Lopez  Procedure(s) Performed: COLONOSCOPY WITH PROPOFOL ESOPHAGOGASTRODUODENOSCOPY (EGD) WITH PROPOFOL BIOPSY POLYPECTOMY  Patient Location: Short Stay  Anesthesia Type:General  Level of Consciousness: awake and patient cooperative  Airway & Oxygen Therapy: Patient Spontanous Breathing  Post-op Assessment: Report given to RN and Post -op Vital signs reviewed and stable  Post vital signs: Reviewed and stable  Last Vitals:  Vitals Value Taken Time  BP 128/54 04/20/23 0945  Temp 97.6   Pulse 62 04/20/23 0945  Resp 16 04/20/23 0945  SpO2 98 % 04/20/23 0945    Last Pain:  Vitals:   04/20/23 0945  TempSrc: Oral  PainSc: 0-No pain      Patients Stated Pain Goal: 5 (04/20/23 0710)  Complications: No notable events documented.

## 2023-04-20 NOTE — Anesthesia Procedure Notes (Signed)
Date/Time: 04/20/2023 8:39 AM  Performed by: Franco Nones, CRNAPre-anesthesia Checklist: Patient identified, Emergency Drugs available, Suction available, Timeout performed and Patient being monitored Patient Re-evaluated:Patient Re-evaluated prior to induction Oxygen Delivery Method: Nasal Cannula

## 2023-04-23 ENCOUNTER — Encounter (INDEPENDENT_AMBULATORY_CARE_PROVIDER_SITE_OTHER): Payer: Self-pay | Admitting: *Deleted

## 2023-04-23 NOTE — Anesthesia Postprocedure Evaluation (Signed)
Anesthesia Post Note  Patient: PEDRITO SHELLHORN  Procedure(s) Performed: COLONOSCOPY WITH PROPOFOL ESOPHAGOGASTRODUODENOSCOPY (EGD) WITH PROPOFOL BIOPSY POLYPECTOMY  Patient location during evaluation: Phase II Anesthesia Type: General Level of consciousness: awake Pain management: pain level controlled Vital Signs Assessment: post-procedure vital signs reviewed and stable Respiratory status: spontaneous breathing and respiratory function stable Cardiovascular status: blood pressure returned to baseline and stable Postop Assessment: no headache and no apparent nausea or vomiting Anesthetic complications: no Comments: Late entry   No notable events documented.   Last Vitals:  Vitals:   04/20/23 0710 04/20/23 0945  BP: (!) 145/66 (!) 128/54  Pulse: (!) 58 62  Resp: 18 16  Temp: 36.6 C   SpO2: 98% 98%    Last Pain:  Vitals:   04/20/23 0945  TempSrc: Oral  PainSc: 0-No pain                 Windell Norfolk

## 2023-04-24 ENCOUNTER — Encounter (HOSPITAL_COMMUNITY): Payer: Self-pay | Admitting: Gastroenterology

## 2023-04-25 ENCOUNTER — Other Ambulatory Visit (HOSPITAL_COMMUNITY): Payer: Self-pay | Admitting: Gastroenterology

## 2023-04-25 DIAGNOSIS — A048 Other specified bacterial intestinal infections: Secondary | ICD-10-CM

## 2023-04-25 MED ORDER — AMOXICILLIN 500 MG PO TABS
1000.0000 mg | ORAL_TABLET | Freq: Two times a day (BID) | ORAL | 0 refills | Status: AC
Start: 2023-04-25 — End: 2023-05-09

## 2023-04-25 MED ORDER — PANTOPRAZOLE SODIUM 40 MG PO TBEC: 40.0000 mg | DELAYED_RELEASE_TABLET | Freq: Two times a day (BID) | ORAL | 0 refills | Status: DC

## 2023-04-25 MED ORDER — CLARITHROMYCIN 500 MG PO TABS
500.0000 mg | ORAL_TABLET | Freq: Two times a day (BID) | ORAL | 0 refills | Status: AC
Start: 2023-04-25 — End: 2023-05-09

## 2023-04-25 NOTE — Progress Notes (Signed)
I reviewed the pathology results. Ann, can you send her a letter with the findings as described below please? Repeat colonoscopy in 1 year  Thanks,  [name], MD Gastroenterology and Hepatology [facility]  ---------------------------------------------------------------------------------------------  [facility] 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  You had a total of 11 polyps removed. 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  Given these findings, it is recommended that your next colonoscopy be performed in 1 year given you have atleast 10 colon polyps.  Also in your upper endoscopy (EGD);  You were found to have an infection called H. pylori, which is a bacteria that lives in the stomach. We will send you a pack of medications to take for 14 days: 2 antibiotics and an acid blocking medication omeprazole. Take them all twice a day. It is VERY IMPORTANT that you take all of the medications as directed. If the infection is not fully treated, in can increase your risk of stomach cancer. I ordered all the medications   Please call us at [phone] if you have persistent problems or have questions about your condition that have not been fully answered at this time.  Sincerely,  [name], MD Gastroenterology and Hepatology

## 2023-04-26 ENCOUNTER — Encounter (INDEPENDENT_AMBULATORY_CARE_PROVIDER_SITE_OTHER): Payer: Self-pay | Admitting: *Deleted

## 2023-04-30 ENCOUNTER — Telehealth (INDEPENDENT_AMBULATORY_CARE_PROVIDER_SITE_OTHER): Payer: Self-pay | Admitting: Gastroenterology

## 2023-04-30 NOTE — Telephone Encounter (Signed)
Pt left voicemail in regards to not having a bowel movement since colonoscopy. Returned call to patient. Pt states he has not had a bowel movement since last Wednesday. Pt also states his stomach is "swelled out". Pt reports no abdominal pain, nausea or tenderness. Please advise. Thank you.

## 2023-04-30 NOTE — Telephone Encounter (Signed)
Pt contacted and verbalized understanding.  

## 2023-04-30 NOTE — Telephone Encounter (Signed)
Hi Tanya  Patient had 11 polyps removed , if there is no abdominal pain ,fever or chills and no Blood in stool , there is likely nothing to be concerned about . Although he has history of constipation.   Recommend:  Adequate fluid intake , 8 glass of water daily High fiber diet : Dates, prunes , pears, Kiwi  Miralax twice dailyfollowed by Daily  Metamucil twice daily  If symptoms get worse : abdominal pain , fever , chills , than come to the ED

## 2023-05-03 ENCOUNTER — Ambulatory Visit (INDEPENDENT_AMBULATORY_CARE_PROVIDER_SITE_OTHER): Payer: Medicare HMO | Admitting: Gastroenterology

## 2023-05-10 ENCOUNTER — Observation Stay (HOSPITAL_COMMUNITY)
Admission: EM | Admit: 2023-05-10 | Discharge: 2023-05-11 | Disposition: A | Discharge status: Home / Self Care | Payer: Medicare HMO | Attending: Family Medicine | Admitting: Family Medicine

## 2023-05-10 ENCOUNTER — Ambulatory Visit: Payer: Medicare HMO | Admitting: Cardiology

## 2023-05-10 ENCOUNTER — Other Ambulatory Visit: Payer: Self-pay

## 2023-05-10 DIAGNOSIS — I10 Essential (primary) hypertension: Secondary | ICD-10-CM | POA: Diagnosis present

## 2023-05-10 DIAGNOSIS — I2510 Atherosclerotic heart disease of native coronary artery without angina pectoris: Secondary | ICD-10-CM | POA: Diagnosis present

## 2023-05-10 DIAGNOSIS — Z79899 Other long term (current) drug therapy: Secondary | ICD-10-CM | POA: Insufficient documentation

## 2023-05-10 DIAGNOSIS — Z87891 Personal history of nicotine dependence: Secondary | ICD-10-CM | POA: Insufficient documentation

## 2023-05-10 DIAGNOSIS — R7989 Other specified abnormal findings of blood chemistry: Secondary | ICD-10-CM | POA: Diagnosis not present

## 2023-05-10 DIAGNOSIS — R079 Chest pain, unspecified: Secondary | ICD-10-CM | POA: Diagnosis not present

## 2023-05-10 DIAGNOSIS — E785 Hyperlipidemia, unspecified: Secondary | ICD-10-CM | POA: Diagnosis present

## 2023-05-10 DIAGNOSIS — Z955 Presence of coronary angioplasty implant and graft: Secondary | ICD-10-CM | POA: Insufficient documentation

## 2023-05-10 DIAGNOSIS — Z7984 Long term (current) use of oral hypoglycemic drugs: Secondary | ICD-10-CM | POA: Diagnosis not present

## 2023-05-10 DIAGNOSIS — Z7982 Long term (current) use of aspirin: Secondary | ICD-10-CM | POA: Diagnosis not present

## 2023-05-10 DIAGNOSIS — I4891 Unspecified atrial fibrillation: Secondary | ICD-10-CM | POA: Insufficient documentation

## 2023-05-10 DIAGNOSIS — E119 Type 2 diabetes mellitus without complications: Secondary | ICD-10-CM | POA: Diagnosis not present

## 2023-05-10 DIAGNOSIS — R42 Dizziness and giddiness: Secondary | ICD-10-CM | POA: Diagnosis present

## 2023-05-10 DIAGNOSIS — R531 Weakness: Principal | ICD-10-CM

## 2023-05-10 DIAGNOSIS — E1169 Type 2 diabetes mellitus with other specified complication: Secondary | ICD-10-CM

## 2023-05-10 DIAGNOSIS — Z7902 Long term (current) use of antithrombotics/antiplatelets: Secondary | ICD-10-CM | POA: Insufficient documentation

## 2023-05-10 DIAGNOSIS — R0789 Other chest pain: Principal | ICD-10-CM | POA: Insufficient documentation

## 2023-05-10 LAB — URINALYSIS, ROUTINE W REFLEX MICROSCOPIC
Bilirubin Urine: NEGATIVE
Glucose, UA: NEGATIVE mg/dL
Hgb urine dipstick: NEGATIVE
Ketones, ur: NEGATIVE mg/dL
Leukocytes,Ua: NEGATIVE
Nitrite: NEGATIVE
Protein, ur: NEGATIVE mg/dL
Specific Gravity, Urine: 1.011 (ref 1.005–1.030)
pH: 6 (ref 5.0–8.0)

## 2023-05-10 LAB — CBC
HCT: 43.4 % (ref 39.0–52.0)
Hemoglobin: 13.9 g/dL (ref 13.0–17.0)
MCH: 30.3 pg (ref 26.0–34.0)
MCHC: 32 g/dL (ref 30.0–36.0)
MCV: 94.6 fL (ref 80.0–100.0)
Platelets: 188 10*3/uL (ref 150–400)
RBC: 4.59 MIL/uL (ref 4.22–5.81)
RDW: 13.5 % (ref 11.5–15.5)
WBC: 9.2 10*3/uL (ref 4.0–10.5)
nRBC: 0 % (ref 0.0–0.2)

## 2023-05-10 LAB — HEPATIC FUNCTION PANEL
ALT: 45 U/L — ABNORMAL HIGH (ref 0–44)
AST: 33 U/L (ref 15–41)
Albumin: 3.8 g/dL (ref 3.5–5.0)
Alkaline Phosphatase: 97 U/L (ref 38–126)
Bilirubin, Direct: 0.1 mg/dL (ref 0.0–0.2)
Indirect Bilirubin: 0.4 mg/dL (ref 0.3–0.9)
Total Bilirubin: 0.5 mg/dL (ref 0.3–1.2)
Total Protein: 6.7 g/dL (ref 6.5–8.1)

## 2023-05-10 LAB — BASIC METABOLIC PANEL
Anion gap: 10 (ref 5–15)
BUN: 11 mg/dL (ref 8–23)
CO2: 26 mmol/L (ref 22–32)
Calcium: 8.8 mg/dL — ABNORMAL LOW (ref 8.9–10.3)
Chloride: 100 mmol/L (ref 98–111)
Creatinine, Ser: 0.79 mg/dL (ref 0.61–1.24)
GFR, Estimated: >60 mL/min (ref >60)
Glucose, Bld: 113 mg/dL — ABNORMAL HIGH (ref 70–99)
Potassium: 3.5 mmol/L (ref 3.5–5.1)
Sodium: 136 mmol/L (ref 135–145)

## 2023-05-10 LAB — CBG MONITORING, ED
Glucose-Capillary: 110 mg/dL — ABNORMAL HIGH (ref 70–99)
Glucose-Capillary: 112 mg/dL — ABNORMAL HIGH (ref 70–99)

## 2023-05-10 LAB — TROPONIN I (HIGH SENSITIVITY)
Troponin I (High Sensitivity): 11 ng/L (ref <18)
Troponin I (High Sensitivity): 22 ng/L — ABNORMAL HIGH (ref <18)

## 2023-05-10 LAB — GLUCOSE, CAPILLARY: Glucose-Capillary: 142 mg/dL — ABNORMAL HIGH (ref 70–99)

## 2023-05-10 MED ORDER — POTASSIUM CHLORIDE CRYS ER 20 MEQ PO TBCR
40.0000 meq | EXTENDED_RELEASE_TABLET | Freq: Once | ORAL | Status: AC
  Administered 2023-05-10: 40 meq via ORAL
  Filled 2023-05-10: qty 2

## 2023-05-10 MED ORDER — INSULIN ASPART 100 UNIT/ML IJ SOLN
0.0000 [IU] | Freq: Three times a day (TID) | INTRAMUSCULAR | Status: DC
  Administered 2023-05-11: 1 [IU] via SUBCUTANEOUS

## 2023-05-10 NOTE — ED Triage Notes (Signed)
Pt was at [facility] and he became dizzy and started getting sweaty and became nervous and weak. Pt has arrived [facility] with a reminder not to get any MRI due to a procedure he done previously. Pt has no c/o at this time.

## 2023-05-10 NOTE — ED Provider Notes (Signed)
[place] EMERGENCY DEPARTMENT AT [facility] Provider Note   CSN: [id number] Arrival date & time: 05/10/23  1456     History  Chief Complaint  Patient presents with   Dizziness   Weakness    Shawn Lopez is a 82 y.o. male.  Patient with a history of coronary artery disease.  Patient was in the store and started to get dizzy with chest discomfort and weakness.  He stated the same symptoms occurred when he had his heart attack approximately 6 years ago.  The symptoms lasted for about 45 minutes  The history is provided by the patient and medical records. No language interpreter was used.  Dizziness Quality:  Lightheadedness Severity:  Moderate Onset quality:  Sudden Timing:  Intermittent Progression:  Waxing and waning Chronicity:  New Context: not when bending over   Relieved by:  Nothing Worsened by:  Nothing Ineffective treatments:  None tried Associated symptoms: weakness   Associated symptoms: no blood in stool, no chest pain, no diarrhea and no headaches   Weakness Associated symptoms: dizziness   Associated symptoms: no abdominal pain, no chest pain, no cough, no diarrhea, no frequency, no headaches and no seizures        Home Medications Prior to Admission medications   Medication Sig Start Date End Date Taking? Authorizing Provider  aspirin EC 81 MG EC tablet Take 1 tablet (81 mg total) by mouth daily. 01/17/17   Ghimire, Werner Lean, MD  atorvastatin (LIPITOR) 80 MG tablet Take 1 tablet (80 mg total) by mouth daily at 6 PM. 03/09/17   Jodelle Gross, NP  Cholecalciferol (VITAMIN D) 50 MCG (2000 UT) tablet Take 2,000 Units by mouth daily.    [provider]  clopidogrel (PLAVIX) 75 MG tablet Take 1 tablet (75 mg total) by mouth daily. 03/09/17   Jodelle Gross, NP  glimepiride (AMARYL) 2 MG tablet Take 1-2 mg by mouth See admin instructions. Take 1 mg daily. If blood sugar is 175 or higher take 2 mg daily 10/13/21   [provider]  HYDROcodone-acetaminophen (NORCO) 10-325 MG tablet Take 1 tablet by mouth every 4 (four) hours as needed for moderate pain or severe pain. 09/27/22   [provider]  lisinopril (ZESTRIL) 10 MG tablet Take 5-10 mg by mouth See admin instructions. Take 5 mg twice daily. May instead take 10 mg once daily if bp is 150 or higher in the morning 10/05/22   [provider]  nitroGLYCERIN (NITROSTAT) 0.4 MG SL tablet Place 1 tablet (0.4 mg total) under the tongue every 5 (five) minutes as needed for chest pain. For 3 doses only 01/16/17   Ghimire, Werner Lean, MD  pantoprazole (PROTONIX) 40 MG tablet Take 40 mg by mouth daily as needed (heartburn). 10/20/22   [provider]  pantoprazole (PROTONIX) 40 MG tablet Take 1 tablet (40 mg total) by mouth 2 (two) times daily for 14 days. 04/25/23 05/09/23  Franky Macho, MD      Allergies    Brilinta [ticagrelor], Amlodipine, and Other    Review of Systems   Review of Systems  Constitutional:  Negative for appetite change and fatigue.  HENT:  Negative for congestion, ear discharge and sinus pressure.   Eyes:  Negative for discharge.  Respiratory:  Negative for cough.   Cardiovascular:  Negative for chest pain.  Gastrointestinal:  Negative for abdominal pain, blood in stool and diarrhea.  Genitourinary:  Negative for frequency and hematuria.  Musculoskeletal:  Negative for back pain.  Skin:  Negative for rash.  Neurological:  Positive for dizziness and weakness. Negative for seizures and headaches.  Psychiatric/Behavioral:  Negative for hallucinations.     Physical Exam Updated Vital Signs BP (!) 160/70   Pulse 72   Temp 98.2 F (36.8 C) (Oral)   Resp 19   Ht 5\' 10"  (1.778 m)   Wt 81.6 kg   SpO2 95%   BMI 25.83 kg/m  Physical Exam Vitals and nursing note reviewed.  Constitutional:      Appearance: He is well-developed.  HENT:     Head: Normocephalic.     Nose: Nose normal.  Eyes:     General: No  scleral icterus.    Conjunctiva/sclera: Conjunctivae normal.  Neck:     Thyroid: No thyromegaly.  Cardiovascular:     Rate and Rhythm: Normal rate and regular rhythm.     Heart sounds: No murmur heard.    No friction rub. No gallop.  Pulmonary:     Breath sounds: No stridor. No wheezing or rales.  Chest:     Chest wall: No tenderness.  Abdominal:     General: There is no distension.     Tenderness: There is no abdominal tenderness. There is no rebound.  Musculoskeletal:        General: Normal range of motion.     Cervical back: Neck supple.  Lymphadenopathy:     Cervical: No cervical adenopathy.  Skin:    Findings: No erythema or rash.  Neurological:     Mental Status: He is alert and oriented to person, place, and time.     Motor: No abnormal muscle tone.     Coordination: Coordination normal.  Psychiatric:        Behavior: Behavior normal.     ED Results / Procedures / Treatments   Labs (all labs ordered are listed, but only abnormal results are displayed) Labs Reviewed  BASIC METABOLIC PANEL - Abnormal; Notable for the following components:      Result Value   Glucose, Bld 113 (*)    Calcium 8.8 (*)    All other components within normal limits  HEPATIC FUNCTION PANEL - Abnormal; Notable for the following components:   ALT 45 (*)    All other components within normal limits  CBG MONITORING, ED - Abnormal; Notable for the following components:   Glucose-Capillary 110 (*)    All other components within normal limits  CBG MONITORING, ED - Abnormal; Notable for the following components:   Glucose-Capillary 112 (*)    All other components within normal limits  TROPONIN I (HIGH SENSITIVITY) - Abnormal; Notable for the following components:   Troponin I (High Sensitivity) 22 (*)    All other components within normal limits  CBC  URINALYSIS, ROUTINE W REFLEX MICROSCOPIC  TROPONIN I (HIGH SENSITIVITY)    EKG EKG Interpretation Date/Time:  Thursday May 10 2023  15:20:43 EDT Ventricular Rate:  75 PR Interval:  173 QRS Duration:  109 QT Interval:  416 QTC Calculation: 465 R Axis:   49  Text Interpretation: Sinus rhythm Confirmed by Bethann Berkshire [phone]) on 05/10/2023 7:57:50 PM  Radiology No results found.  Procedures Procedures    Medications Ordered in ED Medications - No data to display  ED Course/ Medical Decision Making/ A&P  Medical Decision Making Amount and/or Complexity of Data Reviewed Labs: ordered.  Risk Decision regarding hospitalization.   Patient with dizziness and chest pressure that has resolved.  He will be admitted to medicine and have his enzymes cycled with possible cardiology consult tomorrow       Final Clinical Impression(s) / ED Diagnoses Final diagnoses:  Weakness    Rx / DC Orders ED Discharge Orders     None         Bethann Berkshire, MD 05/11/23 1135

## 2023-05-10 NOTE — H&P (Signed)
[facility] H&P   Patient Demographics:    Shawn Lopez, is a 82 y.o. male  MRN: [id number]   DOB - [birth year]  Admit Date - 05/10/2023  Outpatient Primary MD for the patient is Anabel Halon, MD  Referring MD/NP/PA: Dr Estell Harpin  Outpatient Specialists: cardiology Dr [name]    Patient coming from: home  Chief Complaint  Patient presents with   Dizziness   Weakness      HPI:    Shawn Lopez  is a 82 y.o. male, with medical history significant of atrial fibrillation not on anticoagulation or any medication for rate control, coronary artery disease, hypertension, hyperlipidemia, type 2 diabetes mellitus, recent hospitalization last March for complaints of dizziness, with normal stress test, patient with colonoscopy last month, significant for multiple polyps, s/p resection, and possible H. pylori for which she is taking amoxicillin and erythromycin, as well patient has been seen by his cardiologist and July for which he had 14 days heart monitor significant for heart rate in the 50s, but no pauses, and significant for a few episodes of PSVT. -Patient presents to ED today secondary to complaints of lightheadedness, dizziness, sweating and chest discomfort, midsternal, nonradiating, no relieving or provoking factor, patient reports it does remind him with his previous MI which prompted him to come to ED, currently reports he is chest pain-free, feeling better, reports that he is back on his Plavix after his colonoscopy/endoscopy. -In ED EKG nonacute, troponins trending up 11>> 22, otherwise no significant labs abnormalities bedside glucose of 113 potassium of 3.5, [facility] hospitalist consulted to admit.    Review of systems:      A full 10 point Review of Systems was done, except as stated above, all other Review of Systems were negative.   With Past History of the following :     Past Medical History:  Diagnosis Date   Arthritis    Atrial fibrillation (HCC) 2006   a. in 2006; not on anticoagulation, no recent atrial fib seen as of 2018.   Borderline hypertension    CAD (coronary artery disease)    a. NSTEMI 12/2016: cath showing 95% mid-LAD stenosis, 75% prox-LAD stenosis and 75% 1st Diag stenosis. DES to the proximal and mid-LAD along with the 1st Diagonal..   Chronic back pain    Prior trauma with vertebral fracture   Hyperlipidemia    Hypertension    Osteopenia    Sinus bradycardia    Type 2 diabetes mellitus [facility])       Past Surgical History:  Procedure Laterality Date   APPENDECTOMY  1960   BIOPSY  04/20/2023   Procedure: BIOPSY;  Surgeon: Franky Macho, MD;  Location: [facility];  Service: Endoscopy;;   CATARACT EXTRACTION  2012   right eye,lens implantation   CHOLECYSTECTOMY     CIRCUMCISION  2010   meatotomy and dilatation   COLONOSCOPY N/A 01/14/2013  Procedure: COLONOSCOPY;  Surgeon: Dalia Heading, MD;  Location: [facility];  Service: Gastroenterology;  Laterality: N/A;   COLONOSCOPY WITH PROPOFOL N/A 04/20/2023   Procedure: COLONOSCOPY WITH PROPOFOL;  Surgeon: Franky Macho, MD;  Location: [facility];  Service: Endoscopy;  Laterality: N/A;  7:30am;asa 3   CORONARY ANGIOGRAPHY N/A 01/15/2017   Procedure: Coronary Angiography;  Surgeon: Runell Gess, MD;  Location: [facility] INVASIVE CV LAB;  Service: Cardiovascular;  Laterality: N/A;   CORONARY PRESSURE/FFR STUDY N/A 04/04/2019   Procedure: INTRAVASCULAR PRESSURE WIRE/FFR STUDY;  Surgeon: Yvonne Kendall, MD;  Location: [facility] INVASIVE CV LAB;  Service: Cardiovascular;  Laterality: N/A;   CORONARY STENT INTERVENTION N/A 01/15/2017   Procedure: Coronary Stent Intervention;  Surgeon: Runell Gess, MD;  Location: [facility] INVASIVE CV LAB;  Service: Cardiovascular;  Laterality: N/A;   ESOPHAGOGASTRODUODENOSCOPY (EGD) WITH PROPOFOL N/A 04/20/2023   Procedure: ESOPHAGOGASTRODUODENOSCOPY  (EGD) WITH PROPOFOL;  Surgeon: Franky Macho, MD;  Location: [facility];  Service: Endoscopy;  Laterality: N/A;  7:30am;asa 3   LEFT HEART CATH AND CORONARY ANGIOGRAPHY N/A 04/04/2019   Procedure: LEFT HEART CATH AND CORONARY ANGIOGRAPHY;  Surgeon: Yvonne Kendall, MD;  Location: [facility] INVASIVE CV LAB;  Service: Cardiovascular;  Laterality: N/A;   POLYPECTOMY  04/20/2023   Procedure: POLYPECTOMY;  Surgeon: Franky Macho, MD;  Location: [facility];  Service: Endoscopy;;      Social History:     Social History   Tobacco Use   Smoking status: Former    Current packs/day: 0.00    Average packs/day: 1.5 packs/day for 15.0 years (22.5 ttl pk-yrs)    Types: Cigarettes    Start date: 04/20/1982    Quit date: 04/20/1997    Years since quitting: 26.0    Passive exposure: Past   Smokeless tobacco: Never  Substance Use Topics   Alcohol use: No       Family History :     Family History  Problem Relation Age of Onset   CAD Father    Heart attack Brother      Home Medications:   Prior to Admission medications   Medication Sig Start Date End Date Taking? Authorizing Provider  aspirin EC 81 MG EC tablet Take 1 tablet (81 mg total) by mouth daily. 01/17/17   Ghimire, Werner Lean, MD  atorvastatin (LIPITOR) 80 MG tablet Take 1 tablet (80 mg total) by mouth daily at 6 PM. 03/09/17   Jodelle Gross, NP  Cholecalciferol (VITAMIN D) 50 MCG (2000 UT) tablet Take 2,000 Units by mouth daily.    [provider]  clopidogrel (PLAVIX) 75 MG tablet Take 1 tablet (75 mg total) by mouth daily. 03/09/17   Jodelle Gross, NP  glimepiride (AMARYL) 2 MG tablet Take 1-2 mg by mouth See admin instructions. Take 1 mg daily. If blood sugar is 175 or higher take 2 mg daily 10/13/21   [provider]  HYDROcodone-acetaminophen (NORCO) 10-325 MG tablet Take 1 tablet by mouth every 4 (four) hours as needed for moderate pain or severe pain. 09/27/22   [provider]   lisinopril (ZESTRIL) 10 MG tablet Take 5-10 mg by mouth See admin instructions. Take 5 mg twice daily. May instead take 10 mg once daily if bp is 150 or higher in the morning 10/05/22   [provider]  nitroGLYCERIN (NITROSTAT) 0.4 MG SL tablet Place 1 tablet (0.4 mg total) under the tongue every 5 (five) minutes as needed for chest pain.  For 3 doses only 01/16/17   Ghimire, Werner Lean, MD  pantoprazole (PROTONIX) 40 MG tablet Take 40 mg by mouth daily as needed (heartburn). 10/20/22   [provider]  pantoprazole (PROTONIX) 40 MG tablet Take 1 tablet (40 mg total) by mouth 2 (two) times daily for 14 days. 04/25/23 05/09/23  Franky Macho, MD     Allergies:     Allergies  Allergen Reactions   Brilinta [Ticagrelor]     Dyspnea    Amlodipine     Hypotension    Other Other (See Comments)    Pt states antibiotics give him the shakes     Physical Exam:   Vitals  Blood pressure (!) 160/70, pulse 72, temperature 98.2 F (36.8 C), temperature source Oral, resp. rate 19, height 5\' 10"  (1.778 m), weight 81.6 kg, SpO2 95%.   1. General Well Developed male, laying in bed, no apparent distress  2. Normal affect and insight, Not Suicidal or Homicidal, Awake Alert, Oriented X 3.  3. No F.N deficits, ALL C.Nerves Intact, Strength 5/5 all 4 extremities, Sensation intact all 4 extremities, Plantars down going.  4. Ears and Eyes appear Normal, Conjunctivae clear, PERRLA. Moist Oral Mucosa.  5. Supple Neck, No JVD, No cervical lymphadenopathy appriciated, No Carotid Bruits.  6. Symmetrical Chest wall movement, Good air movement bilaterally, CTAB.  7. RRR, No Gallops, Rubs or Murmurs, No Parasternal Heave.  8. Positive Bowel Sounds, Abdomen Soft, No tenderness, No organomegaly appriciated,No rebound -guarding or rigidity.  9.  No Cyanosis, Normal Skin Turgor, No Skin Rash or Bruise.  10. Good muscle tone,  joints appear normal , no effusions, Normal ROM.     Data  Review:    CBC Recent Labs  Lab 05/10/23 1548  WBC 9.2  HGB 13.9  HCT 43.4  PLT 188  MCV 94.6  MCH 30.3  MCHC 32.0  RDW 13.5   ------------------------------------------------------------------------------------------------------------------  Chemistries  Recent Labs  Lab 05/10/23 1548  NA 136  K 3.5  CL 100  CO2 26  GLUCOSE 113*  BUN 11  CREATININE 0.79  CALCIUM 8.8*  AST 33  ALT 45*  ALKPHOS 97  BILITOT 0.5   ------------------------------------------------------------------------------------------------------------------ estimated creatinine clearance is 74.8 mL/min (by C-G formula based on SCr of 0.79 mg/dL). ------------------------------------------------------------------------------------------------------------------ No results for input(s): "TSH", "T4TOTAL", "T3FREE", "THYROIDAB" in the last 72 hours.  Invalid input(s): "FREET3"  Coagulation profile No results for input(s): "INR", "PROTIME" in the last 168 hours. ------------------------------------------------------------------------------------------------------------------- No results for input(s): "DDIMER" in the last 72 hours. -------------------------------------------------------------------------------------------------------------------  Cardiac Enzymes No results for input(s): "CKMB", "TROPONINI", "MYOGLOBIN" in the last 168 hours.  Invalid input(s): "CK" ------------------------------------------------------------------------------------------------------------------    Component Value Date/Time   BNP 39.0 11/23/2022 1656     ---------------------------------------------------------------------------------------------------------------  Urinalysis    Component Value Date/Time   COLORURINE YELLOW 05/10/2023 1647   APPEARANCEUR CLEAR 05/10/2023 1647   APPEARANCEUR Clear 06/23/2021 1517   LABSPEC 1.011 05/10/2023 1647   PHURINE 6.0 05/10/2023 1647   GLUCOSEU NEGATIVE 05/10/2023  1647   HGBUR NEGATIVE 05/10/2023 1647   BILIRUBINUR NEGATIVE 05/10/2023 1647   BILIRUBINUR Negative 06/23/2021 1517   KETONESUR NEGATIVE 05/10/2023 1647   PROTEINUR NEGATIVE 05/10/2023 1647   UROBILINOGEN negative (A) 02/27/2020 1010   UROBILINOGEN 0.2 12/19/2008 1117   NITRITE NEGATIVE 05/10/2023 1647   LEUKOCYTESUR NEGATIVE 05/10/2023 1647    ----------------------------------------------------------------------------------------------------------------   Imaging Results:    No results found.  My personal review of EKG: Sinus rhythm, heart rate of 75, QTc  465, nonspecific intraventricular delay, no significant change from most recent EKG in March 2024   Assessment & Plan:    Principal Problem:   Chest pain Active Problems:   Essential hypertension   Hyperlipidemia LDL goal <70   Diabetes mellitus (HCC)   CAD (coronary artery disease) with PCI 12/2016   Chest pain, lightheadedness, diaphoresis -Reports the symptoms resembles his previous MI -Troponins likely trending up 11>> 22, even though they are mildly elevated, but they have never been elevated in the recent future, so he will be admitted observation -Continue to trend troponin overnight -Chest this March was normal -EKG is nonacute -Already took his Plavix, will give him aspirin, continue with home dose statin -With recent cardiac monitor, significant for heart rate in the 50s, but no heart block or pauses, was noted for brief episodes of PSVT, will continue on telemetry monitoring, unclear if this contributing to his symptoms -Hold on initiating beta-blockers given heart rate in the 50s during recent heart monitor -Consult cardiology input regarding further recommendations, I will keep n.p.o. after 5 AM for possible need for procedures if deemed necessary by cardiology  Possible H. pylori  - patient with recent colonoscopy/endoscopy, was significant for multiple polyps which were resected, patient report he is on  clarithromycin, Protonix and amoxicillin for bacteria in his stomach, likely he is being treated for H. pylori, he took his meds this evening, will await pharmacy to confirm his meds in the morning, for which these meds need to be continued in a.m.   CAD (coronary artery disease) with PCI 12/2016 -Continue with aspirin, Plavix, statin   Diabetes mellitus (HCC) - Glimepiride, will keep an insulin sliding scale during hospital stay   Hyperlipidemia  -Lipid panel, continue with home statin   Essential hypertension - Continue with lisinopril  DVT Prophylaxis Heparin  AM Labs Ordered, also please review Full Orders  Family Communication: Admission, patients condition and plan of care including tests being ordered have been discussed with the patient and daughter at bedside who indicate understanding and agree with the plan and Code Status.  Code Status full  Likely DC to  home  Condition GUARDED    Consults called: none, cards requested in EPIC    Admission status: observation    Time spent in minutes : 70 minutes   Huey Bienenstock M.D on 05/10/2023 at 9:22 PM   [facility] - Office  [phone]

## 2023-05-10 NOTE — ED Notes (Signed)
Provider at bedside

## 2023-05-10 NOTE — ED Notes (Signed)
ED TO INPATIENT HANDOFF REPORT  ED Nurse Name and Phone #: Efraim Kaufmann, RN  S Name/Age/Gender Shawn Lopez Number 82 y.o. male Room/Bed: APA19/APA19  Code Status   Code Status: Prior  Home/SNF/Other Home Patient oriented to: self, place, time, and situation Is this baseline? Yes   Triage Complete: Triage complete  Chief Complaint Chest pain [R07.9]  Triage Note Pt was at [facility] and he became dizzy and started getting sweaty and became nervous and weak. Pt has arrived [facility] with a reminder not to get any MRI due to a procedure he done previously. Pt has no c/o at this time.    Allergies Allergies  Allergen Reactions   Brilinta [Ticagrelor]     Dyspnea    Amlodipine     Hypotension    Other Other (See Comments)    Pt states antibiotics give him the shakes    Level of Care/Admitting Diagnosis ED Disposition     ED Disposition  Admit   Condition  --   Comment  Hospital Area: [facility] [[id number]]  Level of Care: Telemetry [5]  Covid Evaluation: Asymptomatic - no recent exposure (last 10 days) testing not required  Diagnosis: Chest pain [409811]  Admitting Physician: Chiquita Loth  Attending Physician: Randol Kern, DAWOOD S [[id number]]          B Medical/Surgery History Past Medical History:  Diagnosis Date   Arthritis    Atrial fibrillation (HCC) 2006   a. in 2006; not on anticoagulation, no recent atrial fib seen as of 2018.   Borderline hypertension    CAD (coronary artery disease)    a. NSTEMI 12/2016: cath showing 95% mid-LAD stenosis, 75% prox-LAD stenosis and 75% 1st Diag stenosis. DES to the proximal and mid-LAD along with the 1st Diagonal..   Chronic back pain    Prior trauma with vertebral fracture   Hyperlipidemia    Hypertension    Osteopenia    Sinus bradycardia    Type 2 diabetes mellitus [facility])    Past Surgical History:  Procedure Laterality Date   APPENDECTOMY  1960   BIOPSY  04/20/2023   Procedure: BIOPSY;  Surgeon:  Franky Macho, MD;  [facility];  Service: Endoscopy;;   CATARACT EXTRACTION  2012   right eye,lens implantation   CHOLECYSTECTOMY     CIRCUMCISION  2010   meatotomy and dilatation   COLONOSCOPY N/A 01/14/2013   Procedure: COLONOSCOPY;  Surgeon: Dalia Heading, MD;  [facility];  Service: Gastroenterology;  Laterality: N/A;   COLONOSCOPY WITH PROPOFOL N/A 04/20/2023   Procedure: COLONOSCOPY WITH PROPOFOL;  Surgeon: Franky Macho, MD;  [facility];  Service: Endoscopy;  Laterality: N/A;  7:30am;asa 3   CORONARY ANGIOGRAPHY N/A 01/15/2017   Procedure: Coronary Angiography;  Surgeon: Runell Gess, MD;  Location: [facility] INVASIVE CV LAB;  Service: Cardiovascular;  Laterality: N/A;   CORONARY PRESSURE/FFR STUDY N/A 04/04/2019   Procedure: INTRAVASCULAR PRESSURE WIRE/FFR STUDY;  Surgeon: Yvonne Kendall, MD;  Location: [facility] INVASIVE CV LAB;  Service: Cardiovascular;  Laterality: N/A;   CORONARY STENT INTERVENTION N/A 01/15/2017   Procedure: Coronary Stent Intervention;  Surgeon: Runell Gess, MD;  Location: [facility] INVASIVE CV LAB;  Service: Cardiovascular;  Laterality: N/A;   ESOPHAGOGASTRODUODENOSCOPY (EGD) WITH PROPOFOL N/A 04/20/2023   Procedure: ESOPHAGOGASTRODUODENOSCOPY (EGD) WITH PROPOFOL;  Surgeon: Franky Macho, MD;  [facility];  Service: Endoscopy;  Laterality: N/A;  7:30am;asa 3   LEFT HEART CATH AND CORONARY ANGIOGRAPHY  N/A 04/04/2019   Procedure: LEFT HEART CATH AND CORONARY ANGIOGRAPHY;  Surgeon: Yvonne Kendall, MD;  Location: [facility] INVASIVE CV LAB;  Service: Cardiovascular;  Laterality: N/A;   POLYPECTOMY  04/20/2023   Procedure: POLYPECTOMY;  Surgeon: Franky Macho, MD;  [facility];  Service: Endoscopy;;     A IV Location/Drains/Wounds Patient Lines/Drains/Airways Status     Active Line/Drains/Airways     Name Placement date Placement time Site Days   Peripheral IV 05/10/23 Anterior;Right Forearm 05/10/23   1511  Forearm  less than 1            Intake/Output Last 24 hours  Intake/Output Summary (Last 24 hours) at 05/10/2023 2119 Last data filed at 05/10/2023 1938 Gross per 24 hour  Intake --  Output 625 ml  Net -625 ml    Labs/Imaging Results for orders placed or performed during the hospital encounter of 05/10/23 (from the past 48 hour(s))  Basic metabolic panel     Status: Abnormal   Collection Time: 05/10/23  3:48 PM  Result Value Ref Range   Sodium 136 135 - 145 mmol/L   Potassium 3.5 3.5 - 5.1 mmol/L   Chloride 100 98 - 111 mmol/L   CO2 26 22 - 32 mmol/L   Glucose, Bld 113 (H) 70 - 99 mg/dL    Comment: Glucose reference range applies only to samples taken after fasting for at least 8 hours.   BUN 11 8 - 23 mg/dL   Creatinine, Ser 1.61 0.61 - 1.24 mg/dL   Calcium 8.8 (L) 8.9 - 10.3 mg/dL   GFR, Estimated >09 >60 mL/min    Comment: (NOTE) Calculated using the CKD-EPI Creatinine Equation (2021)    Anion gap 10 5 - 15    Comment: Performed at [facility], [address], [place], [place] [address]  CBC     Status: None   Collection Time: 05/10/23  3:48 PM  Result Value Ref Range   WBC 9.2 4.0 - 10.5 K/uL   RBC 4.59 4.22 - 5.81 MIL/uL   Hemoglobin 13.9 13.0 - 17.0 g/dL   HCT 81.1 91.4 - 78.2 %   MCV 94.6 80.0 - 100.0 fL   MCH 30.3 26.0 - 34.0 pg   MCHC 32.0 30.0 - 36.0 g/dL   RDW 95.6 21.3 - 08.6 %   Platelets 188 150 - 400 K/uL   nRBC 0.0 0.0 - 0.2 %    Comment: Performed at [facility], [address] [place], [place] [address]  Troponin I (High Sensitivity)     Status: None   Collection Time: 05/10/23  3:48 PM  Result Value Ref Range   Troponin I (High Sensitivity) 11 <18 ng/L    Comment: (NOTE) Elevated high sensitivity troponin I (hsTnI) values and significant  changes across serial measurements may suggest ACS but many other  chronic and acute conditions are known to elevate hsTnI results.  Refer to the "Links" section for chest pain algorithms and  additional  guidance. Performed at [facility], [address] [place], [place] [address]   Hepatic function panel     Status: Abnormal   Collection Time: 05/10/23  3:48 PM  Result Value Ref Range   Total Protein 6.7 6.5 - 8.1 g/dL   Albumin 3.8 3.5 - 5.0 g/dL   AST 33 15 - 41 U/L   ALT 45 (H) 0 - 44 U/L   Alkaline Phosphatase 97 38 - 126 U/L   Total Bilirubin 0.5 0.3 - 1.2  mg/dL   Bilirubin, Direct 0.1 0.0 - 0.2 mg/dL   Indirect Bilirubin 0.4 0.3 - 0.9 mg/dL    Comment: Performed at [facility], [address], [place], [place] [address]  CBG monitoring, ED     Status: Abnormal   Collection Time: 05/10/23  4:46 PM  Result Value Ref Range   Glucose-Capillary 110 (H) 70 - 99 mg/dL    Comment: Glucose reference range applies only to samples taken after fasting for at least 8 hours.  Urinalysis, Routine w reflex microscopic -Urine, Clean Catch     Status: None   Collection Time: 05/10/23  4:47 PM  Result Value Ref Range   Color, Urine YELLOW YELLOW   APPearance CLEAR CLEAR   Specific Gravity, Urine 1.011 1.005 - 1.030   pH 6.0 5.0 - 8.0   Glucose, UA NEGATIVE NEGATIVE mg/dL   Hgb urine dipstick NEGATIVE NEGATIVE   Bilirubin Urine NEGATIVE NEGATIVE   Ketones, ur NEGATIVE NEGATIVE mg/dL   Protein, ur NEGATIVE NEGATIVE mg/dL   Nitrite NEGATIVE NEGATIVE   Leukocytes,Ua NEGATIVE NEGATIVE    Comment: Performed at [facility], [address] [place], [place] [address]  Troponin I (High Sensitivity)     Status: Abnormal   Collection Time: 05/10/23  6:24 PM  Result Value Ref Range   Troponin I (High Sensitivity) 22 (H) <18 ng/L    Comment: (NOTE) Elevated high sensitivity troponin I (hsTnI) values and significant  changes across serial measurements may suggest ACS but many other  chronic and acute conditions are known to elevate hsTnI results.  Refer to the "Links" section for chest pain algorithms and additional  guidance. Performed at [facility], [address],  [place], [place] [address]   CBG monitoring, ED     Status: Abnormal   Collection Time: 05/10/23  7:35 PM  Result Value Ref Range   Glucose-Capillary 112 (H) 70 - 99 mg/dL    Comment: Glucose reference range applies only to samples taken after fasting for at least 8 hours.   No results found.  Pending Labs Unresulted Labs (From admission, onward)    None       Vitals/Pain Today's Vitals   05/10/23 1915 05/10/23 1930 05/10/23 1945 05/10/23 2030  BP: (!) 155/71 (!) 159/73 (!) 169/72 (!) 160/70  Pulse: 63 63 65 72  Resp: 19 18 16 19   Temp:      TempSrc:      SpO2: 95% 96% 96% 95%  Weight:      Height:      PainSc:        Isolation Precautions No active isolations  Medications Medications - No data to display  Mobility walks     Focused Assessments Cardiac Assessment Handoff:    Lab Results  Component Value Date   TROPONINI <0.03 09/25/2017   No results found for: "DDIMER" Does the Patient currently have chest pain? No    R Recommendations: See Admitting Provider Note  Report given to:   Additional Notes: .

## 2023-05-10 NOTE — ED Notes (Addendum)
Pt states that he feels that his blood sugar has dropped. This RN checked pt's CBG: 112 Coke given to pt per pt request. No juice available at this time

## 2023-05-11 ENCOUNTER — Observation Stay (HOSPITAL_COMMUNITY): Payer: Medicare HMO

## 2023-05-11 DIAGNOSIS — I10 Essential (primary) hypertension: Secondary | ICD-10-CM | POA: Diagnosis not present

## 2023-05-11 DIAGNOSIS — R531 Weakness: Secondary | ICD-10-CM

## 2023-05-11 DIAGNOSIS — R0789 Other chest pain: Secondary | ICD-10-CM | POA: Diagnosis not present

## 2023-05-11 DIAGNOSIS — E785 Hyperlipidemia, unspecified: Secondary | ICD-10-CM

## 2023-05-11 DIAGNOSIS — R079 Chest pain, unspecified: Secondary | ICD-10-CM

## 2023-05-11 LAB — GLUCOSE, CAPILLARY
Glucose-Capillary: 123 mg/dL — ABNORMAL HIGH (ref 70–99)
Glucose-Capillary: 178 mg/dL — ABNORMAL HIGH (ref 70–99)
Glucose-Capillary: 169 mg/dL — ABNORMAL HIGH (ref 70–99)

## 2023-05-11 LAB — ECHOCARDIOGRAM COMPLETE
AR max vel: 2.8 cm2
AV Area VTI: 2.96 cm2
AV Area mean vel: 2.69 cm2
AV Mean grad: 5.2 mmHg
AV Peak grad: 11.6 mmHg
Ao pk vel: 1.7 m/s
Area-P 1/2: 2.01 cm2
Height: 70 in
MV M vel: 3.05 m/s
MV Peak grad: 37.1 mmHg
S' Lateral: 3.1 cm
Weight: 2895.96 oz

## 2023-05-11 LAB — TROPONIN I (HIGH SENSITIVITY)
Troponin I (High Sensitivity): 50 ng/L — ABNORMAL HIGH (ref <18)
Troponin I (High Sensitivity): 44 ng/L — ABNORMAL HIGH (ref <18)

## 2023-05-11 LAB — LIPID PANEL
Cholesterol: 143 mg/dL (ref 0–200)
HDL: 38 mg/dL — ABNORMAL LOW (ref >40)
LDL Cholesterol: 82 mg/dL (ref 0–99)
Total CHOL/HDL Ratio: 3.8 RATIO
Triglycerides: 113 mg/dL (ref <150)
VLDL: 23 mg/dL (ref 0–40)

## 2023-05-11 MED ORDER — NITROGLYCERIN 0.4 MG SL SUBL: 0.4000 mg | SUBLINGUAL_TABLET | SUBLINGUAL | Status: DC | PRN

## 2023-05-11 MED ORDER — PANTOPRAZOLE SODIUM 40 MG PO TBEC: 40.0000 mg | DELAYED_RELEASE_TABLET | Freq: Two times a day (BID) | ORAL | 3 refills | Status: DC

## 2023-05-11 MED ORDER — ASPIRIN 81 MG PO TBEC
81.0000 mg | DELAYED_RELEASE_TABLET | Freq: Every day | ORAL | Status: DC
  Administered 2023-05-11: 81 mg via ORAL
  Filled 2023-05-11: qty 1

## 2023-05-11 MED ORDER — ONDANSETRON HCL 4 MG/2ML IJ SOLN: 4.0000 mg | Freq: Four times a day (QID) | INTRAMUSCULAR | Status: DC | PRN

## 2023-05-11 MED ORDER — ATORVASTATIN CALCIUM 80 MG PO TABS: 80.0000 mg | ORAL_TABLET | Freq: Every evening | ORAL | 3 refills | Status: DC

## 2023-05-11 MED ORDER — HEPARIN SODIUM (PORCINE) 5000 UNIT/ML IJ SOLN: 5000.0000 [IU] | Freq: Three times a day (TID) | INTRAMUSCULAR | Status: DC

## 2023-05-11 MED ORDER — CLOPIDOGREL BISULFATE 75 MG PO TABS: 75.0000 mg | ORAL_TABLET | Freq: Every day | ORAL | Status: DC

## 2023-05-11 MED ORDER — ACETAMINOPHEN 325 MG PO TABS: 650.0000 mg | ORAL_TABLET | ORAL | Status: DC | PRN

## 2023-05-11 MED ORDER — PANTOPRAZOLE SODIUM 40 MG PO TBEC
40.0000 mg | DELAYED_RELEASE_TABLET | Freq: Two times a day (BID) | ORAL | Status: DC
  Administered 2023-05-11: 40 mg via ORAL
  Filled 2023-05-11: qty 1

## 2023-05-11 MED ORDER — LISINOPRIL 5 MG PO TABS
5.0000 mg | ORAL_TABLET | Freq: Two times a day (BID) | ORAL | Status: DC
  Administered 2023-05-11: 5 mg via ORAL
  Filled 2023-05-11: qty 1

## 2023-05-11 MED ORDER — VITAMIN D 25 MCG (1000 UNIT) PO TABS
2000.0000 [IU] | ORAL_TABLET | Freq: Every day | ORAL | Status: DC
  Administered 2023-05-11: 2000 [IU] via ORAL
  Filled 2023-05-11: qty 2

## 2023-05-11 MED ORDER — HYDROCODONE-ACETAMINOPHEN 10-325 MG PO TABS: 1.0000 | ORAL_TABLET | ORAL | Status: DC | PRN

## 2023-05-11 MED ORDER — ASPIRIN 81 MG PO TBEC: 81.0000 mg | DELAYED_RELEASE_TABLET | Freq: Every day | ORAL | 4 refills | Status: AC

## 2023-05-11 MED ORDER — ATORVASTATIN CALCIUM 40 MG PO TABS: 80.0000 mg | ORAL_TABLET | Freq: Every day | ORAL | Status: DC

## 2023-05-11 NOTE — Plan of Care (Signed)
  Problem: Education: Goal: Knowledge of General Education information will improve Description: Including pain rating scale, medication(s)/side effects and non-pharmacologic comfort measures Outcome: Progressing   Problem: Health Behavior/Discharge Planning: Goal: Ability to manage health-related needs will improve Outcome: Progressing   Problem: Clinical Measurements: Goal: Ability to maintain clinical measurements within normal limits will improve Outcome: Progressing Goal: Will remain free from infection Outcome: Progressing Goal: Diagnostic test results will improve Outcome: Progressing Goal: Respiratory complications will improve Outcome: Progressing Goal: Cardiovascular complication will be avoided Outcome: Progressing   Problem: Activity: Goal: Risk for activity intolerance will decrease Outcome: Progressing   Problem: Nutrition: Goal: Adequate nutrition will be maintained Outcome: Progressing   Problem: Coping: Goal: Level of anxiety will decrease Outcome: Progressing   Problem: Elimination: Goal: Will not experience complications related to bowel motility Outcome: Progressing Goal: Will not experience complications related to urinary retention Outcome: Progressing   Problem: Pain Managment: Goal: General experience of comfort will improve Outcome: Progressing   Problem: Safety: Goal: Ability to remain free from injury will improve Outcome: Progressing   Problem: Skin Integrity: Goal: Risk for impaired skin integrity will decrease Outcome: Progressing   Problem: Education: Goal: Understanding of cardiac disease, CV risk reduction, and recovery process will improve Outcome: Progressing Goal: Individualized Educational Video(s) Outcome: Progressing   Problem: Activity: Goal: Ability to tolerate increased activity will improve Outcome: Progressing   Problem: Cardiac: Goal: Ability to achieve and maintain adequate cardiovascular perfusion will  improve Outcome: Progressing   Problem: Health Behavior/Discharge Planning: Goal: Ability to safely manage health-related needs after discharge will improve Outcome: Progressing   Problem: Education: Goal: Ability to describe self-care measures that may prevent or decrease complications (Diabetes Survival Skills Education) will improve Outcome: Progressing Goal: Individualized Educational Video(s) Outcome: Progressing   Problem: Coping: Goal: Ability to adjust to condition or change in health will improve Outcome: Progressing   Problem: Fluid Volume: Goal: Ability to maintain a balanced intake and output will improve Outcome: Progressing   Problem: Health Behavior/Discharge Planning: Goal: Ability to identify and utilize available resources and services will improve Outcome: Progressing Goal: Ability to manage health-related needs will improve Outcome: Progressing   Problem: Metabolic: Goal: Ability to maintain appropriate glucose levels will improve Outcome: Progressing   Problem: Nutritional: Goal: Maintenance of adequate nutrition will improve Outcome: Progressing Goal: Progress toward achieving an optimal weight will improve Outcome: Progressing   Problem: Skin Integrity: Goal: Risk for impaired skin integrity will decrease Outcome: Progressing   Problem: Tissue Perfusion: Goal: Adequacy of tissue perfusion will improve Outcome: Progressing   

## 2023-05-11 NOTE — Discharge Instructions (Signed)
1)Avoid ibuprofen/Advil/Aleve/Motrin/Goody Powders/Naproxen/BC powders/Meloxicam/Diclofenac/Indomethacin and other Nonsteroidal anti-inflammatory medications as these will make you more likely to bleed and can cause stomach ulcers, can also cause Kidney problems.   2) follow-up with cardiologist as outpatient as advised  3)Please take medications as prescribed including Lipitor/atorvastatin

## 2023-05-11 NOTE — TOC CM/SW Note (Signed)
Transition of Care [facility]) - Inpatient Brief Assessment   Patient Details  Name: Shawn Lopez MRN: [id number] Date of Birth: 1940-11-02  Transition of Care [facility]) CM/SW Contact:    Villa Herb, LCSWA Phone Number: 05/11/2023, 11:10 AM   Clinical Narrative: Transition of Care Department [facility]) has reviewed patient and no TOC needs have been identified at this time. We will continue to monitor patient advancement through interdisciplinary progression rounds. If new patient transition needs arise, please place a TOC consult.  Transition of Care Asessment: Insurance and Status: Insurance coverage has been reviewed Patient has primary care physician: Yes Home environment has been reviewed: from home Prior level of function:: independent Prior/Current Home Services: No current home services Social Determinants of Health Reivew: SDOH reviewed no interventions necessary Readmission risk has been reviewed: Yes Transition of care needs: no transition of care needs at this time

## 2023-05-11 NOTE — Discharge Summary (Signed)
Shawn Lopez, is a 82 y.o. male  DOB 1941-01-13  MRN [id number].  Admission date:  05/10/2023  Admitting Physician  Starleen Arms, MD  Discharge Date:  05/11/2023   Primary MD  Anabel Halon, MD  Recommendations for primary care physician for things to follow:   1)Avoid ibuprofen/Advil/Aleve/Motrin/Goody Powders/Naproxen/BC powders/Meloxicam/Diclofenac/Indomethacin and other Nonsteroidal anti-inflammatory medications as these will make you more likely to bleed and can cause stomach ulcers, can also cause Kidney problems.   2) follow-up with cardiologist as outpatient as advised  3)Please take medications as prescribed including Lipitor/atorvastatin  Admission Diagnosis  Weakness [R53.1] Chest pain [R07.9]   Discharge Diagnosis  Weakness [R53.1] Chest pain [R07.9]    Principal Problem:   Chest pain Active Problems:   Essential hypertension   Hyperlipidemia LDL goal <70   Diabetes mellitus (HCC)   CAD (coronary artery disease) with PCI 12/2016      Past Medical History:  Diagnosis Date   Arthritis    Atrial fibrillation (HCC) 2006   a. in 2006; not on anticoagulation, no recent atrial fib seen as of 2018.   Borderline hypertension    CAD (coronary artery disease)    a. NSTEMI 12/2016: cath showing 95% mid-LAD stenosis, 75% prox-LAD stenosis and 75% 1st Diag stenosis. DES to the proximal and mid-LAD along with the 1st Diagonal..   Chronic back pain    Prior trauma with vertebral fracture   Hyperlipidemia    Hypertension    Osteopenia    Sinus bradycardia    Type 2 diabetes mellitus [facility])     Past Surgical History:  Procedure Laterality Date   APPENDECTOMY  1960   BIOPSY  04/20/2023   Procedure: BIOPSY;  Surgeon: Franky Macho, MD;  Location: [facility];  Service: Endoscopy;;   CATARACT EXTRACTION  2012   right eye,lens implantation   CHOLECYSTECTOMY     CIRCUMCISION   2010   meatotomy and dilatation   COLONOSCOPY N/A 01/14/2013   Procedure: COLONOSCOPY;  Surgeon: Dalia Heading, MD;  Location: [facility];  Service: Gastroenterology;  Laterality: N/A;   COLONOSCOPY WITH PROPOFOL N/A 04/20/2023   Procedure: COLONOSCOPY WITH PROPOFOL;  Surgeon: Franky Macho, MD;  Location: [facility];  Service: Endoscopy;  Laterality: N/A;  7:30am;asa 3   CORONARY ANGIOGRAPHY N/A 01/15/2017   Procedure: Coronary Angiography;  Surgeon: Runell Gess, MD;  Location: [facility] INVASIVE CV LAB;  Service: Cardiovascular;  Laterality: N/A;   CORONARY PRESSURE/FFR STUDY N/A 04/04/2019   Procedure: INTRAVASCULAR PRESSURE WIRE/FFR STUDY;  Surgeon: Yvonne Kendall, MD;  Location: [facility] INVASIVE CV LAB;  Service: Cardiovascular;  Laterality: N/A;   CORONARY STENT INTERVENTION N/A 01/15/2017   Procedure: Coronary Stent Intervention;  Surgeon: Runell Gess, MD;  Location: [facility] INVASIVE CV LAB;  Service: Cardiovascular;  Laterality: N/A;   ESOPHAGOGASTRODUODENOSCOPY (EGD) WITH PROPOFOL N/A 04/20/2023   Procedure: ESOPHAGOGASTRODUODENOSCOPY (EGD) WITH PROPOFOL;  Surgeon: Franky Macho, MD;  Location: [facility];  Service: Endoscopy;  Laterality: N/A;  7:30am;asa 3   LEFT  HEART CATH AND CORONARY ANGIOGRAPHY N/A 04/04/2019   Procedure: LEFT HEART CATH AND CORONARY ANGIOGRAPHY;  Surgeon: Yvonne Kendall, MD;  Location: [facility] INVASIVE CV LAB;  Service: Cardiovascular;  Laterality: N/A;   POLYPECTOMY  04/20/2023   Procedure: POLYPECTOMY;  Surgeon: Franky Macho, MD;  Location: [facility];  Service: Endoscopy;;      HPI  from the history and physical done on the day of admission:    Shawn Lopez  is a 82 y.o. male, with medical history significant of atrial fibrillation not on anticoagulation or any medication for rate control, coronary artery disease, hypertension, hyperlipidemia, type 2 diabetes mellitus, recent hospitalization last March for complaints of dizziness, with normal  stress test, patient with colonoscopy last month, significant for multiple polyps, s/p resection, and possible H. pylori for which she is taking amoxicillin and erythromycin, as well patient has been seen by his cardiologist and July for which he had 14 days heart monitor significant for heart rate in the 50s, but no pauses, and significant for a few episodes of PSVT. -Patient presents to ED today secondary to complaints of lightheadedness, dizziness, sweating and chest discomfort, midsternal, nonradiating, no relieving or provoking factor, patient reports it does remind him with his previous MI which prompted him to come to ED, currently reports he is chest pain-free, feeling better, reports that he is back on his Plavix after his colonoscopy/endoscopy. -In ED EKG nonacute, troponins trending up 11>> 22, otherwise no significant labs abnormalities bedside glucose of 113 potassium of 3.5, [facility] consulted to admit.    Hospital Course:     1)Atypical Chest Pain/H/o CAD-- -S/p stenting to LAD and OM1 in 12/2016 with patent stents by cath in 03/2019  -Nuclear Stress test in March 2024 without reversible ischemia -Ruled out for ACS this time around by EKG and troponins -Brief episodes of SVT noted on the monitor -Echo with EF of 60 to 65%, grade 1 diastolic dysfunction -No aortic stenosis -No wall motion abnormalities -Cardiology consult appreciated outpatient follow-up advised -Continue aspirin, Plavix and atorvastatin   2)Possible H. pylori  - patient with recent colonoscopy/endoscopy, was significant for multiple polyps which were resected, patient report he is on clarithromycin, Protonix and amoxicillin -for H. Pylori -Okay to complete same   3)DM2-\- -continue PTA glimepiride   4)HTN--continue lisinopril  Discharge Condition: stable  Follow UP   Follow-up Information     Ellsworth Lennox, PA-C Follow up on 06/08/2023.   Specialties: Cardiology, Cardiology Why:  Cardiology Hospital Follow-up on 06/08/2023 at 3:30 PM. Contact information: [address] [place] [address] [phone]                  Consults obtained - cardiology  Diet and Activity recommendation:  As advised  Discharge Instructions    Discharge Instructions     Call MD for:  difficulty breathing, headache or visual disturbances   Complete by: As directed    Call MD for:  persistant dizziness or light-headedness   Complete by: As directed    Call MD for:  persistant nausea and vomiting   Complete by: As directed    Call MD for:  temperature >100.4   Complete by: As directed    Diet - low sodium heart healthy   Complete by: As directed    Discharge instructions   Complete by: As directed    1)Avoid ibuprofen/Advil/Aleve/Motrin/Goody Powders/Naproxen/BC powders/Meloxicam/Diclofenac/Indomethacin and other Nonsteroidal anti-inflammatory medications as these will make you more likely to bleed and  can cause stomach ulcers, can also cause Kidney problems.   2) follow-up with cardiologist as outpatient as advised  3)Please take medications as prescribed including Lipitor/atorvastatin   Increase activity slowly   Complete by: As directed         Discharge Medications     Allergies as of 05/11/2023       Reactions   Brilinta [ticagrelor]    Dyspnea    Amlodipine    Hypotension    Other Other (See Comments)   Pt states antibiotics give him the shakes        Medication List     TAKE these medications    AMOXICILLIN PO Take 2 capsules by mouth in the morning and at bedtime.   aspirin EC 81 MG tablet Take 1 tablet (81 mg total) by mouth daily with breakfast. What changed: when to take this   atorvastatin 80 MG tablet Commonly known as: LIPITOR Take 1 tablet (80 mg total) by mouth every evening. What changed: when to take this   clopidogrel 75 MG tablet Commonly known as: PLAVIX Take 1 tablet (75 mg total) by mouth daily.   glimepiride 2 MG  tablet Commonly known as: AMARYL Take 1 mg by mouth as needed (hyperglycemia).   lisinopril 10 MG tablet Commonly known as: ZESTRIL Take 5-10 mg by mouth See admin instructions. Take 5 mg twice daily. May instead take 10 mg once daily if bp is 150 or higher in the morning   nitroGLYCERIN 0.4 MG SL tablet Commonly known as: Nitrostat Place 1 tablet (0.4 mg total) under the tongue every 5 (five) minutes as needed for chest pain. For 3 doses only   pantoprazole 40 MG tablet Commonly known as: PROTONIX Take 1 tablet (40 mg total) by mouth in the morning and at bedtime.        Major procedures and Radiology Reports - PLEASE review detailed and final reports for all details, in brief -   ECHOCARDIOGRAM COMPLETE  Result Date: 05/11/2023    ECHOCARDIOGRAM REPORT   Patient Name:   Shawn Lopez Date of Exam: 05/11/2023 Medical Rec #:  [id number]           Height:       70.0 in Accession #:    [id number]          Weight:       181.0 lb Date of Birth:  1940/11/06          BSA:          2.001 m Patient Age:    81 years            BP:           121/50 mmHg Patient Gender: M                   HR:           52 bpm. [facility] Procedure: 2D Echo, Cardiac Doppler and Color Doppler Indications:    Chest Pain R07.9  History:        Patient has prior history of Echocardiogram examinations, most                 recent 07/23/2020. CAD, Arrythmias:Atrial Fibrillation,                 Signs/Symptoms:Chest Pain; Risk Factors:Hypertension,                 Dyslipidemia, Diabetes and Former Smoker.  Sonographer:    Aron Baba Referring Phys: [id number] Ellsworth Lennox  Sonographer Comments: Suboptimal parasternal window. Image acquisition challenging due to respiratory motion. IMPRESSIONS  1. Left ventricular ejection fraction, by estimation, is 60 to 65%. The left ventricle has normal function. The left ventricle has no regional wall motion abnormalities. Left ventricular diastolic parameters are  consistent with Grade I diastolic dysfunction (impaired relaxation).  2. Right ventricular systolic function is normal. The right ventricular size is normal. There is normal pulmonary artery systolic pressure.  3. The mitral valve is normal in structure. Trivial mitral valve regurgitation. No evidence of mitral stenosis.  4. The tricuspid valve is abnormal.  5. The aortic valve is tricuspid. There is mild calcification of the aortic valve. There is mild thickening of the aortic valve. Aortic valve regurgitation is not visualized. No aortic stenosis is present.  6. The inferior vena cava is normal in size with greater than 50% respiratory variability, suggesting right atrial pressure of 3 mmHg. FINDINGS  Left Ventricle: Left ventricular ejection fraction, by estimation, is 60 to 65%. The left ventricle has normal function. The left ventricle has no regional wall motion abnormalities. The left ventricular internal cavity size was normal in size. There is  no left ventricular hypertrophy. Left ventricular diastolic parameters are consistent with Grade I diastolic dysfunction (impaired relaxation). Normal left ventricular filling pressure. Right Ventricle: The right ventricular size is normal. Right vetricular wall thickness was not well visualized. Right ventricular systolic function is normal. There is normal pulmonary artery systolic pressure. The tricuspid regurgitant velocity is 2.70 m/s, and with an assumed right atrial pressure of 3 mmHg, the estimated right ventricular systolic pressure is 32.2 mmHg. Left Atrium: Left atrial size was normal in size. Right Atrium: Right atrial size was normal in size. Pericardium: There is no evidence of pericardial effusion. Mitral Valve: The mitral valve is normal in structure. Trivial mitral valve regurgitation. No evidence of mitral valve stenosis. Tricuspid Valve: The tricuspid valve is abnormal. Tricuspid valve regurgitation is mild . No evidence of tricuspid stenosis.  Aortic Valve: The aortic valve is tricuspid. There is mild calcification of the aortic valve. There is mild thickening of the aortic valve. There is mild aortic valve annular calcification. Aortic valve regurgitation is not visualized. No aortic stenosis  is present. Aortic valve mean gradient measures 5.2 mmHg. Aortic valve peak gradient measures 11.6 mmHg. Aortic valve area, by VTI measures 2.96 cm. Pulmonic Valve: The pulmonic valve was not well visualized. Pulmonic valve regurgitation is not visualized. No evidence of pulmonic stenosis. Aorta: The aortic root and ascending aorta are structurally normal, with no evidence of dilitation. Venous: The inferior vena cava is normal in size with greater than 50% respiratory variability, suggesting right atrial pressure of 3 mmHg. IAS/Shunts: The interatrial septum was not well visualized.  LEFT VENTRICLE PLAX 2D LVIDd:         4.60 cm   Diastology LVIDs:         3.10 cm   LV e' medial:    6.22 cm/s LV PW:         1.00 cm   LV E/e' medial:  13.1 LV IVS:        1.00 cm   LV e' lateral:   8.70 cm/s LVOT diam:     2.10 cm   LV E/e' lateral: 9.4 LV SV:         107 LV SV Index:   54 LVOT Area:     3.46 cm  RIGHT VENTRICLE RV S prime:     12.90 cm/s TAPSE (M-mode): 2.4 cm LEFT ATRIUM             Index        RIGHT ATRIUM           Index LA diam:        4.30 cm 2.15 cm/m   RA Area:     18.00 cm LA Vol (A2C):   38.0 ml 18.99 ml/m  RA Volume:   48.70 ml  24.34 ml/m LA Vol (A4C):   74.8 ml 37.39 ml/m LA Biplane Vol: 56.4 ml 28.19 ml/m  AORTIC VALVE AV Area (Vmax):    2.80 cm AV Area (Vmean):   2.69 cm AV Area (VTI):     2.96 cm AV Vmax:           170.49 cm/s AV Vmean:          106.247 cm/s AV VTI:            0.363 m AV Peak Grad:      11.6 mmHg AV Mean Grad:      5.2 mmHg LVOT Vmax:         138.00 cm/s LVOT Vmean:        82.600 cm/s LVOT VTI:          0.310 m LVOT/AV VTI ratio: 0.85  AORTA Ao Root diam: 3.80 cm Ao Asc diam:  3.30 cm MITRAL VALVE                TRICUSPID  VALVE MV Area (PHT): 2.01 cm     TR Peak grad:   29.2 mmHg MV Decel Time: 377 msec     TR Vmax:        270.00 cm/s MR Peak grad: 37.1 mmHg MR Vmax:      304.50 cm/s   SHUNTS MV E velocity: 81.50 cm/s   Systemic VTI:  0.31 m MV A velocity: 122.00 cm/s  Systemic Diam: 2.10 cm MV E/A ratio:  0.67 Dina Rich MD Electronically signed by Dina Rich MD Signature Date/Time: 05/11/2023/12:02:53 PM    Final      Today   Subjective    Shawn Lopez today has no further chest pains No fever  Or chills   No Nausea, Vomiting or Diarrhea         Patient has been seen and examined prior to discharge   Objective   Blood pressure (!) 121/50, pulse (!) 58, temperature 98.2 F (36.8 C), temperature source Oral, resp. rate 18, height 5\' 10"  (1.778 m), weight 82.1 kg, SpO2 95%.   Intake/Output Summary (Last 24 hours) at 05/11/2023 1550 Last data filed at 05/11/2023 0917 Gross per 24 hour  Intake 0 ml  Output 2225 ml  Net -2225 ml    Exam Gen:- Awake Alert, no acute distress  HEENT:- Willis.AT, No sclera icterus Neck-Supple Neck,No JVD,.  Lungs-  CTAB , good air movement bilaterally CV- S1, S2 normal, regular Abd-  +ve B.Sounds, Abd Soft, No tenderness,    Extremity/Skin:- No  edema,   good pulses Psych-affect is appropriate, oriented x3 Neuro-no new focal deficits, no tremors    Data Review   CBC w Diff:  Lab Results  Component Value Date   WBC 9.2 05/10/2023   HGB 13.9 05/10/2023   HCT 43.4 05/10/2023   PLT 188 05/10/2023   LYMPHOPCT 25 11/24/2022   MONOPCT 11 11/24/2022   EOSPCT 4 11/24/2022   BASOPCT 1  11/24/2022    CMP:  Lab Results  Component Value Date   NA 136 05/10/2023   K 3.5 05/10/2023   CL 100 05/10/2023   CO2 26 05/10/2023   BUN 11 05/10/2023   CREATININE 0.79 05/10/2023   PROT 6.7 05/10/2023   ALBUMIN 3.8 05/10/2023   BILITOT 0.5 05/10/2023   ALKPHOS 97 05/10/2023   AST 33 05/10/2023   ALT 45 (H) 05/10/2023  .  Total Discharge time is about 33  minutes  Shon Hale M.D on 05/11/2023 at 3:50 PM  Go to [email or web address] -  for contact info  [facility] - Office  [phone]

## 2023-05-11 NOTE — Progress Notes (Signed)
  Echocardiogram 2D Echocardiogram has been performed.  Shawn Lopez 05/11/2023, 11:47 AM

## 2023-05-11 NOTE — Consult Note (Addendum)
Cardiology Consultation   Patient ID: Shawn Lopez MRN: [id number]; DOB: [birth year]  Admit date: 05/10/2023 Date of Consult: 05/11/2023  PCP:  Anabel Halon, MD   [facility] Providers Cardiologist:  [name], MD        Patient Profile:   Shawn Lopez is a 82 y.o. male with a hx of CAD (s/p stenting to LAD and OM1 in 12/2016 with patent stents by cath in 03/2019, low-risk NST in 01/2021 and 11/2022), palpitations (PAC's and PVC's with brief SVT by prior monitor), HTN, HLD and Type II DM who is being seen 05/11/2023 for the evaluation of chest pain and elevated troponin values at the request of Dr. Randol Kern.  History of Present Illness:   Shawn Lopez was examined by Dr. [name] in 03/2023 and denied any recent anginal symptoms at that time. He did report episodic dizziness without syncope and given his chronic bradycardia, he was not on AV nodal blocking agents. Was continued on ASA 81 mg daily, Atorvastatin 80 mg daily, Plavix 75 mg daily and Lisinopril 5 mg twice daily.  He presented to [facility] ED on 05/10/2023 for evaluation of dizziness and diaphoresis which started while at [facility]. In talking with the patient today, he reports he is very active at baseline usually as he still works in [employer] and denies any recent chest pain or dyspnea on exertion with these types of activities.  He did undergo an EGD several weeks ago and was diagnosed with H. pylori but did not start antibiotics until last Friday. Reports he has felt "off" since starting the medications but says this is starting to improve. Yesterday, he developed acute dizziness and diaphoresis while walking around at the store. Reports some palpitations and chest discomfort at that time but symptoms resolved within 1 to 2 minutes. Reports his pain was very mild and approximately 1-2/10. He denies any recurrence of symptoms overnight or this morning. Feels back to baseline today and is anxious  to have breakfast.   Initial labs showed WBC 9.2, Hgb 13.9, platelets 188, Na+ 136, K+ 3.5 and creatinine 0.79. Hs troponin values have been at 11, 22, 50 and 44. EKG showing NSR, HR 75 with no acute ST changes.    Past Medical History:  Diagnosis Date   Arthritis    Atrial fibrillation (HCC) 2006   a. in 2006; not on anticoagulation, no recent atrial fib seen as of 2018.   Borderline hypertension    CAD (coronary artery disease)    a. NSTEMI 12/2016: cath showing 95% mid-LAD stenosis, 75% prox-LAD stenosis and 75% 1st Diag stenosis. DES to the proximal and mid-LAD along with the 1st Diagonal..   Chronic back pain    Prior trauma with vertebral fracture   Hyperlipidemia    Hypertension    Osteopenia    Sinus bradycardia    Type 2 diabetes mellitus [facility])     Past Surgical History:  Procedure Laterality Date   APPENDECTOMY  1960   BIOPSY  04/20/2023   Procedure: BIOPSY;  Surgeon: Franky Macho, MD;  Location: [facility];  Service: Endoscopy;;   CATARACT EXTRACTION  2012   right eye,lens implantation   CHOLECYSTECTOMY     CIRCUMCISION  2010   meatotomy and dilatation   COLONOSCOPY N/A 01/14/2013   Procedure: COLONOSCOPY;  Surgeon: Dalia Heading, MD;  Location: [facility];  Service: Gastroenterology;  Laterality: N/A;   COLONOSCOPY WITH PROPOFOL N/A 04/20/2023   Procedure: COLONOSCOPY WITH PROPOFOL;  Surgeon: Franky Macho, MD;  Location: [facility];  Service: Endoscopy;  Laterality: N/A;  7:30am;asa 3   CORONARY ANGIOGRAPHY N/A 01/15/2017   Procedure: Coronary Angiography;  Surgeon: Runell Gess, MD;  Location: [facility] INVASIVE CV LAB;  Service: Cardiovascular;  Laterality: N/A;   CORONARY PRESSURE/FFR STUDY N/A 04/04/2019   Procedure: INTRAVASCULAR PRESSURE WIRE/FFR STUDY;  Surgeon: Yvonne Kendall, MD;  Location: [facility] INVASIVE CV LAB;  Service: Cardiovascular;  Laterality: N/A;   CORONARY STENT INTERVENTION N/A 01/15/2017   Procedure: Coronary Stent Intervention;   Surgeon: Runell Gess, MD;  Location: [facility] INVASIVE CV LAB;  Service: Cardiovascular;  Laterality: N/A;   ESOPHAGOGASTRODUODENOSCOPY (EGD) WITH PROPOFOL N/A 04/20/2023   Procedure: ESOPHAGOGASTRODUODENOSCOPY (EGD) WITH PROPOFOL;  Surgeon: Franky Macho, MD;  Location: [facility];  Service: Endoscopy;  Laterality: N/A;  7:30am;asa 3   LEFT HEART CATH AND CORONARY ANGIOGRAPHY N/A 04/04/2019   Procedure: LEFT HEART CATH AND CORONARY ANGIOGRAPHY;  Surgeon: Yvonne Kendall, MD;  Location: [facility] INVASIVE CV LAB;  Service: Cardiovascular;  Laterality: N/A;   POLYPECTOMY  04/20/2023   Procedure: POLYPECTOMY;  Surgeon: Franky Macho, MD;  Location: [facility];  Service: Endoscopy;;     Home Medications:  Prior to Admission medications   Medication Sig Start Date End Date Taking? Authorizing Provider  AMOXICILLIN PO Take 2 capsules by mouth in the morning and at bedtime.   Yes [provider]  aspirin EC 81 MG EC tablet Take 1 tablet (81 mg total) by mouth daily. Patient taking differently: Take 81 mg by mouth at bedtime. 01/17/17  Yes Ghimire, Werner Lean, MD  clopidogrel (PLAVIX) 75 MG tablet Take 1 tablet (75 mg total) by mouth daily. 03/09/17  Yes Jodelle Gross, NP  glimepiride (AMARYL) 2 MG tablet Take 1 mg by mouth as needed (hyperglycemia). 10/13/21  Yes [provider]  lisinopril (ZESTRIL) 10 MG tablet Take 5-10 mg by mouth See admin instructions. Take 5 mg twice daily. May instead take 10 mg once daily if bp is 150 or higher in the morning 10/05/22  Yes [provider]  nitroGLYCERIN (NITROSTAT) 0.4 MG SL tablet Place 1 tablet (0.4 mg total) under the tongue every 5 (five) minutes as needed for chest pain. For 3 doses only 01/16/17  Yes Ghimire, Werner Lean, MD  pantoprazole (PROTONIX) 40 MG tablet Take 40 mg by mouth in the morning and at bedtime. 10/20/22  Yes [provider]  atorvastatin (LIPITOR) 80 MG tablet Take 1 tablet (80 mg total) by mouth  daily at 6 PM. Patient not taking: Reported on 05/10/2023 03/09/17   Jodelle Gross, NP    Inpatient Medications: Scheduled Meds:  aspirin EC  81 mg Oral Daily   atorvastatin  80 mg Oral q1800   cholecalciferol  2,000 Units Oral Daily   clopidogrel  75 mg Oral QHS   heparin  5,000 Units Subcutaneous Q8H   insulin aspart  0-9 Units Subcutaneous TID WC   lisinopril  5 mg Oral BID   pantoprazole  40 mg Oral BID   Continuous Infusions:  PRN Meds: acetaminophen, HYDROcodone-acetaminophen, nitroGLYCERIN, ondansetron (ZOFRAN) IV  Allergies:    Allergies  Allergen Reactions   Brilinta [Ticagrelor]     Dyspnea    Amlodipine     Hypotension    Other Other (See Comments)    Pt states antibiotics give him the shakes    Social History:   Social History   Socioeconomic History   Marital  status: Married    Spouse name: Not on file   Number of children: 4   Years of education: Not on file   Highest education level: Not on file  Occupational History   Occupation: Holiday representative    Comment: [employer]  Tobacco Use   Smoking status: Former    Current packs/day: 0.00    Average packs/day: 1.5 packs/day for 15.0 years (22.5 ttl pk-yrs)    Types: Cigarettes    Start date: 04/20/1982    Quit date: 04/20/1997    Years since quitting: 26.0    Passive exposure: Past   Smokeless tobacco: Never  Vaping Use   Vaping status: Never Used  Substance and Sexual Activity   Alcohol use: No   Drug use: No   Sexual activity: Not on file  Other Topics Concern   Not on file  Social History Narrative   Not on file   Social Determinants of Health   Financial Resource Strain: Not on file  Food Insecurity: No Food Insecurity (05/11/2023)   Hunger Vital Sign    Worried About Running Out of Food in the Last Year: Never true    Ran Out of Food in the Last Year: Never true  Transportation Needs: No Transportation Needs (05/10/2023)   PRAPARE - Scientist, research (physical sciences) (Medical): No    Lack of Transportation (Non-Medical): No  Physical Activity: Not on file  Stress: Not on file  Social Connections: Not on file  Intimate Partner Violence: Not At Risk (05/10/2023)   Humiliation, Afraid, Rape, and Kick questionnaire    Fear of Current or Ex-Partner: No    Emotionally Abused: No    Physically Abused: No    Sexually Abused: No    Family History:    Family History  Problem Relation Age of Onset   CAD Father    Heart attack Brother      ROS:  Please see the history of present illness.   All other ROS reviewed and negative.     Physical Exam/Data:   Vitals:   05/10/23 2134 05/10/23 2205 05/11/23 0125 05/11/23 0614  BP:  (!) 174/70 (!) 135/57 (!) 121/50  Pulse:  (!) 58 (!) 53 (!) 58  Resp:  18 18 18   Temp: 98 F (36.7 C) 98 F (36.7 C) 98.7 F (37.1 C) 98.2 F (36.8 C)  TempSrc: Oral Oral Oral Oral  SpO2:  99% 98% 95%  Weight:  82.1 kg    Height:        Intake/Output Summary (Last 24 hours) at 05/11/2023 0940 Last data filed at 05/11/2023 0917 Gross per 24 hour  Intake 0 ml  Output 2225 ml  Net -2225 ml      05/10/2023   10:05 PM 05/10/2023    3:26 PM 04/17/2023    9:25 AM  Last 3 Weights  Weight (lbs) 181 lb 180 lb 181 lb 9.6 oz  Weight (kg) 82.1 kg 81.647 kg 82.373 kg     Body mass index is 25.97 kg/m.  General:  Well nourished, well developed male appearing in no acute distress. HEENT: normal Neck: no JVD Vascular: No carotid bruits; Distal pulses 2+ bilaterally Cardiac:  normal S1, S2; RRR; no murmur  Lungs:  clear to auscultation bilaterally, no wheezing, rhonchi or rales  Abd: soft, nontender, no hepatomegaly  Ext: no pitting edema Musculoskeletal:  No deformities, BUE and BLE strength normal and equal Skin: warm and dry  Neuro:  CNs 2-12  intact, no focal abnormalities noted Psych:  Normal affect   EKG:  The EKG was personally reviewed and demonstrates: NSR, HR 75 with no acute ST changes.  Telemetry:   Telemetry was personally reviewed and demonstrates: Sinus bradycardia, HR in 50's to 60's.   Relevant CV Studies:  Echocardiogram: 06/2020 IMPRESSIONS     1. Left ventricular ejection fraction, by estimation, is 55 to 60%. The  left ventricle has normal function. The left ventricle has no regional  wall motion abnormalities. There is mild left ventricular hypertrophy.  Left ventricular diastolic parameters  are indeterminate. Elevated left atrial pressure.   2. Right ventricular systolic function is normal. The right ventricular  size is normal.   3. Left atrial size was moderately dilated.   4. The mitral valve is normal in structure. Trivial mitral valve  regurgitation. No evidence of mitral stenosis.   5. The aortic valve is tricuspid. There is mild calcification of the  aortic valve. There is mild thickening of the aortic valve. Aortic valve  regurgitation is not visualized. No aortic stenosis is present.   6. The inferior vena cava is normal in size with greater than 50%  respiratory variability, suggesting right atrial pressure of 3 mmHg.   NST: 11/2022   The study is normal. There are no perfusion defects The study is low risk.   No ST deviation was noted.   LV perfusion is normal.   Left ventricular function is normal. Nuclear stress EF: 64 %. The left ventricular ejection fraction is normal (55-65%). End diastolic cavity size is normal.  Event Monitor: 03/2023 ZIO monitor reviewed.  13 days, 22 hours analyzed.   Predominant rhythm is sinus with heart rate ranging from 41 bpm up to 106 bpm and average heart rate 53 bpm. There were rare PACs including atrial couplets and triplets representing less than 1% total beats. There were rare PVCs including ventricular couplets representing less than 1% total beats. Several very brief episodes of SVT were noted, the longest of which was only 6 beats. No sustained arrhythmias, pauses, or heart block.  Laboratory Data:  High  Sensitivity Troponin:   Recent Labs  Lab 05/10/23 1548 05/10/23 1824 05/11/23 0324 05/11/23 0458  TROPONINIHS 11 22* 50* 44*     Chemistry Recent Labs  Lab 05/10/23 1548  NA 136  K 3.5  CL 100  CO2 26  GLUCOSE 113*  BUN 11  CREATININE 0.79  CALCIUM 8.8*  GFRNONAA >60  ANIONGAP 10    Recent Labs  Lab 05/10/23 1548  PROT 6.7  ALBUMIN 3.8  AST 33  ALT 45*  ALKPHOS 97  BILITOT 0.5   Lipids  Recent Labs  Lab 05/11/23 0324  CHOL 143  TRIG 113  HDL 38*  LDLCALC 82  CHOLHDL 3.8    Hematology Recent Labs  Lab 05/10/23 1548  WBC 9.2  RBC 4.59  HGB 13.9  HCT 43.4  MCV 94.6  MCH 30.3  MCHC 32.0  RDW 13.5  PLT 188   Thyroid No results for input(s): "TSH", "FREET4" in the last 168 hours.  BNPNo results for input(s): "BNP", "PROBNP" in the last 168 hours.  DDimer No results for input(s): "DDIMER" in the last 168 hours.   Radiology/Studies:  No results found.   Assessment and Plan:   1. Atypical Chest Pain/Elevated Troponin Values - His episode is overall atypical for angina as he is very active at baseline denies any recent anginal symptoms. He did develop an episode of dizziness  and diaphoresis yesterday with associated palpitations and brief chest pain which only lasted for 1 to 2 minutes and spontaneously resolved. Troponin values have been flat, peaking at 50 and downtrending to 44 on most recent check. Episode is more concerning for an arrhythmia but recent monitor which resulted last month showed only brief SVT and rare PAC's and PVC's. He has been in normal sinus rhythm by review of telemetry.  He questions if his episode was possibly due to the recent initiation of antibiotics. - Will obtain an echocardiogram to assess for any structural abnormalities. If this is reassuring, would not anticipate further ischemic evaluation this admission.  2. CAD - He is s/p stenting to LAD and OM1 in 12/2016 with patent stents by cath in 03/2019. He did have a  recent NST in 11/2022 which was low-risk.   - Will plan to obtain an echocardiogram as discussed above. Continue current medical therapy with ASA 81 mg daily, Plavix 75 mg daily and Atorvastatin 80 mg daily.  3. Palpitations - He does report brief palpitations yesterday and recent cardiac monitor which resulted last month showed rare PAC's and PVC's with a 1% burden and brief episodes of SVT with the longest being 6 beats.  - He has not been on AV nodal blocking agents given his average heart rate was 53 bpm while wearing the monitor. He has been in normal sinus rhythm this admission. Continue to avoid AV nodal blocking agents.    3. HTN - BP was initially elevated but has improved, at 121/50 on most recent check. He has been continued on Lisinopril 5 mg twice daily.  4. HLD - FLP was previously at 56 earlier this year, at 5 this admission. Had been on statin therapy temporarily but he has been restarted on Atorvastatin 80mg  daily.   5. H. Pylori - He has been on dual antibiotic therapy along with PPI therapy. Will message the hospitalist team to follow-up on dosing of this.     For questions or updates, please contact [facility] Please consult [email or web address] for contact info under    Signed, Ellsworth Lennox, PA-C  05/11/2023 9:40 AM  Attending note Patient seen and discussed with PA [place] Ouch, I agree with her documentation. 82 yo male history of CAD with stenting LAD and OM1 in April 2018, patent stents by cath in July 2020, low risk NST in May 2022 and March 2024, palpitations (PACs and PVCs with SVT by prior monitor), hypertension, hyperlipidemia, type 2 diabetes, presents with chest pain  K 3.5 BUN 11 Cr 0.79 WBC 9.2 Hgb 13.9 Plt 188  Trop 11-->22-->50-->44 EKG SR, no ischemic changes  11/2022 nuclear stress: no ischemia   1.CAD/Chest pain/Palpitations -11/2022 nuclear stress: no ischemia - EKG no acute ischemic changes, very mild trop elevation to 50 in setting of  SBPs to 170s in ER, possibly transient tachycarrhythmia based on symptoms - episode of palpitations and mild chest pain, dizziness lasted 1-2 minutes. More consistent with symptomatic arrhtyhmia as opposed to ischemia.  - recent monitor did show several though brief episodes of SVT, perhaps this was related given associated palpitations. Limited on av nodal agents given chronic bradycardia.  -f/u echo, no plans for repeat ischemic testing at this time. If significant frequent recurrences consider repeat monitor, if high SVT burden consider antiarrhythmic.   F/u echo, if benign ok for discharge.   Dina Rich MD

## 2023-05-22 ENCOUNTER — Ambulatory Visit (INDEPENDENT_AMBULATORY_CARE_PROVIDER_SITE_OTHER): Payer: Medicare HMO | Admitting: Internal Medicine

## 2023-05-22 ENCOUNTER — Encounter: Payer: Self-pay | Admitting: Internal Medicine

## 2023-05-22 VITALS — BP 158/70 | HR 52 | Ht 70.0 in | Wt 182.2 lb

## 2023-05-22 DIAGNOSIS — Z23 Encounter for immunization: Secondary | ICD-10-CM | POA: Diagnosis not present

## 2023-05-22 DIAGNOSIS — E785 Hyperlipidemia, unspecified: Secondary | ICD-10-CM

## 2023-05-22 DIAGNOSIS — M75121 Complete rotator cuff tear or rupture of right shoulder, not specified as traumatic: Secondary | ICD-10-CM

## 2023-05-22 DIAGNOSIS — I10 Essential (primary) hypertension: Secondary | ICD-10-CM | POA: Diagnosis not present

## 2023-05-22 DIAGNOSIS — E1169 Type 2 diabetes mellitus with other specified complication: Secondary | ICD-10-CM | POA: Diagnosis not present

## 2023-05-22 DIAGNOSIS — Z7984 Long term (current) use of oral hypoglycemic drugs: Secondary | ICD-10-CM

## 2023-05-22 DIAGNOSIS — I25118 Atherosclerotic heart disease of native coronary artery with other forms of angina pectoris: Secondary | ICD-10-CM

## 2023-05-22 NOTE — Progress Notes (Signed)
New Patient Office Visit  Subjective:  Patient ID: Shawn Lopez, male    DOB: 25-Dec-1940  Age: 82 y.o. MRN: [id number]  CC:  Chief Complaint  Patient presents with   Establish Care    HPI Shawn Lopez is a 82 y.o. male with past medical history of CAD s/p stent placement, HTN, type II DM, HLD and chronic shoulder pain who presents for establishing care.  CAD status post stent placement and HTN: He takes aspirin, Plavix and statin.  His blood pressure is elevated today.  He has not taken lisinopril today.  Of note, he takes half tablet of lisinopril 10 mg as needed if his systolic blood pressure is above 140, and then takes another half tablet if his systolic blood pressure is consistently above 150.  He denies any headache, dizziness, chest pain, dyspnea or palpitations currently.  He recently was admitted for dizziness, and he attributes it to elevated BP on that day, up to 200s/80s.  Type II DM: He takes glimepiride 1 mg (half tablet of 2 mg) QD.  His last HbA1c was 7.2.  He used to have hypoglycemic episodes when he took glimepiride whole tablet.  Denies any polyuria or polyphagia currently.  GERD: He is taking pantoprazole 40 mg daily currently.  He recently had EGD in 07/24, which showed H. pylori infection.  He has completed  triple therapy for it.  Denies any nausea, vomiting, dysphagia or odynophagia currently.  He has chronic shoulder pain from rotator cuff tear.  He has had orthopedic surgery evaluation.  He takes Norco 10-325 mg PRN, but rarely needs it.  Denies any numbness or tingling of the UE.  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  Past Medical History:  Diagnosis Date   Arthritis    Atrial fibrillation (HCC) 2006   a. in 2006; not on anticoagulation, no recent atrial fib seen as of 2018.   Borderline hypertension    CAD (coronary artery disease)    a. NSTEMI 12/2016: cath showing 95% mid-LAD stenosis, 75% prox-LAD  stenosis and 75% 1st Diag stenosis. DES to the proximal and mid-LAD along with the 1st Diagonal..   Chronic back pain    Prior trauma with vertebral fracture   Hyperlipidemia    Hypertension    Osteopenia    Sinus bradycardia    Type 2 diabetes mellitus [facility])     Past Surgical History:  Procedure Laterality Date   APPENDECTOMY  1960   BIOPSY  04/20/2023   Procedure: BIOPSY;  Surgeon: Franky Macho, MD;  Location: [facility];  Service: Endoscopy;;   CATARACT EXTRACTION  2012   right eye,lens implantation   CHOLECYSTECTOMY     CIRCUMCISION  2010   meatotomy and dilatation   COLONOSCOPY N/A 01/14/2013   Procedure: COLONOSCOPY;  Surgeon: Dalia Heading, MD;  Location: [facility];  Service: Gastroenterology;  Laterality: N/A;   COLONOSCOPY WITH PROPOFOL N/A 04/20/2023   Procedure: COLONOSCOPY WITH PROPOFOL;  Surgeon: Franky Macho, MD;  Location: [facility];  Service: Endoscopy;  Laterality: N/A;  7:30am;asa 3   CORONARY ANGIOGRAPHY N/A 01/15/2017   Procedure: Coronary Angiography;  Surgeon: Runell Gess, MD;  Location: [facility] INVASIVE CV LAB;  Service: Cardiovascular;  Laterality: N/A;   CORONARY PRESSURE/FFR STUDY N/A 04/04/2019   Procedure: INTRAVASCULAR PRESSURE WIRE/FFR STUDY;  Surgeon: Yvonne Kendall, MD;  Location: [facility] INVASIVE CV LAB;  Service: Cardiovascular;  Laterality: N/A;   CORONARY  STENT INTERVENTION N/A 01/15/2017   Procedure: Coronary Stent Intervention;  Surgeon: Runell Gess, MD;  Location: [facility] INVASIVE CV LAB;  Service: Cardiovascular;  Laterality: N/A;   ESOPHAGOGASTRODUODENOSCOPY (EGD) WITH PROPOFOL N/A 04/20/2023   Procedure: ESOPHAGOGASTRODUODENOSCOPY (EGD) WITH PROPOFOL;  Surgeon: Franky Macho, MD;  Location: [facility];  Service: Endoscopy;  Laterality: N/A;  7:30am;asa 3   LEFT HEART CATH AND CORONARY ANGIOGRAPHY N/A 04/04/2019   Procedure: LEFT HEART CATH AND CORONARY ANGIOGRAPHY;  Surgeon: Yvonne Kendall, MD;  Location: [facility] INVASIVE CV  LAB;  Service: Cardiovascular;  Laterality: N/A;   POLYPECTOMY  04/20/2023   Procedure: POLYPECTOMY;  Surgeon: Franky Macho, MD;  Location: [facility];  Service: Endoscopy;;    Family History  Problem Relation Age of Onset   CAD Father    Heart attack Brother     Social History   Socioeconomic History   Marital status: Married    Spouse name: Not on file   Number of children: 4   Years of education: Not on file   Highest education level: Not on file  Occupational History   Occupation: Holiday representative    Comment: [employer] fast streamer  Tobacco Use   Smoking status: Former    Current packs/day: 0.00    Average packs/day: 1.5 packs/day for 15.0 years (22.5 ttl pk-yrs)    Types: Cigarettes    Start date: 04/20/1982    Quit date: 04/20/1997    Years since quitting: 26.1    Passive exposure: Past   Smokeless tobacco: Never  Vaping Use   Vaping status: Never Used  Substance and Sexual Activity   Alcohol use: No   Drug use: No   Sexual activity: Not on file  Other Topics Concern   Not on file  Social History Narrative   Not on file   Social Determinants of Health   Financial Resource Strain: Not on file  Food Insecurity: No Food Insecurity (05/11/2023)   Hunger Vital Sign    Worried About Running Out of Food in the Last Year: Never true    Ran Out of Food in the Last Year: Never true  Transportation Needs: No Transportation Needs (05/10/2023)   PRAPARE - Administrator, Civil Service (Medical): No    Lack of Transportation (Non-Medical): No  Physical Activity: Not on file  Stress: Not on file  Social Connections: Not on file  Intimate Partner Violence: Not At Risk (05/10/2023)   Humiliation, Afraid, Rape, and Kick questionnaire    Fear of Current or Ex-Partner: No    Emotionally Abused: No    Physically Abused: No    Sexually Abused: No    ROS Review of Systems  Constitutional:  Negative for chills and fever.  HENT:  Negative for congestion  and sore throat.   Eyes:  Negative for pain and discharge.  Respiratory:  Negative for cough and shortness of breath.   Cardiovascular:  Negative for chest pain and palpitations.  Gastrointestinal:  Negative for constipation, diarrhea, nausea and vomiting.  Endocrine: Negative for polydipsia and polyuria.  Genitourinary:  Negative for dysuria and hematuria.  Musculoskeletal:  Positive for arthralgias (R shoulder). Negative for neck pain and neck stiffness.  Skin:  Negative for rash.  Neurological:  Negative for dizziness, weakness, numbness and headaches.  Psychiatric/Behavioral:  Negative for agitation and behavioral problems.     Objective:   Today's Vitals: BP (!) 158/70 (BP Location: Right Arm)   Pulse (!) 52   Ht 5'  10" (1.778 m)   Wt 182 lb 3.2 oz (82.6 kg)   SpO2 95%   BMI 26.14 kg/m   Physical Exam Vitals reviewed.  Constitutional:      General: He is not in acute distress.    Appearance: He is not diaphoretic.  HENT:     Head: Normocephalic and atraumatic.     Mouth/Throat:     Mouth: Mucous membranes are moist.  Eyes:     General: No scleral icterus.    Extraocular Movements: Extraocular movements intact.  Cardiovascular:     Rate and Rhythm: Normal rate and regular rhythm.     Heart sounds: Normal heart sounds. No murmur heard. Pulmonary:     Breath sounds: Normal breath sounds. No wheezing or rales.  Abdominal:     Palpations: Abdomen is soft.     Tenderness: There is no abdominal tenderness.  Musculoskeletal:     Cervical back: Neck supple. No tenderness.     Right lower leg: No edema.     Left lower leg: No edema.  Skin:    General: Skin is warm.     Findings: No rash.  Neurological:     General: No focal deficit present.     Mental Status: He is alert and oriented to person, place, and time.     Sensory: No sensory deficit.     Motor: No weakness.  Psychiatric:        Mood and Affect: Mood normal.        Behavior: Behavior normal.      Assessment & Plan:   Problem List Items Addressed This Visit       Cardiovascular and Mediastinum   Essential hypertension    BP Readings from Last 1 Encounters:  05/22/23 (!) 158/70   Uncontrolled Advised to take lisinopril 10 mg daily regularly instead of as needed Counseled for compliance with the medications Advised DASH diet and moderate exercise/walking as tolerated       Relevant Orders   CMP14+EGFR   CBC with Differential/Platelet   CAD (coronary artery disease) with PCI 12/2016 - Primary    S/p stent placement On aspirin, Plavix and statin Has easy bruising - advised to discuss if he can stop aspirin or Plavix (last stent > 1 year ago) Has nitroglycerin as needed for chest pain Followed by cardiology      Relevant Orders   Lipid Profile     Endocrine   Diabetes mellitus (HCC)    Lab Results  Component Value Date   HGBA1C 7.2 (H) 11/24/2022   Overall well controlled for his age Associated with HTN, CAD and HLD On glimepiride 1 mg daily Did not tolerate metformin Can be a good candidate for Jardiance or Farxiga, check CMP Advised to follow diabetic diet On statin and ACEi F/u CMP and lipid panel Diabetic foot exam: Today Diabetic eye exam: Advised to follow up with Ophthalmology for diabetic eye exam       Relevant Orders   CMP14+EGFR   Hemoglobin A1c   Urine Microalbumin w/creat. ratio     Musculoskeletal and Integument   Right rotator cuff tear    Has chronic right shoulder pain Has had orthopedic surgery evaluation, is not operable Takes Norco as needed for severe pain, needs it rarely        Other   Hyperlipidemia LDL goal <70    On Lipitor 80 mg daily      Other Visit Diagnoses     Encounter for immunization  Relevant Orders   Flu Vaccine Trivalent High Dose (Fluad) (Completed)       Outpatient Encounter Medications as of 05/22/2023  Medication Sig   HYDROcodone-acetaminophen (NORCO) 10-325 MG tablet Take 1 tablet  by mouth every 8 (eight) hours as needed for moderate pain or severe pain.   aspirin EC 81 MG tablet Take 1 tablet (81 mg total) by mouth daily with breakfast.   atorvastatin (LIPITOR) 80 MG tablet Take 1 tablet (80 mg total) by mouth every evening.   clopidogrel (PLAVIX) 75 MG tablet Take 1 tablet (75 mg total) by mouth daily.   glimepiride (AMARYL) 2 MG tablet Take 1 mg by mouth daily with breakfast.   lisinopril (ZESTRIL) 10 MG tablet Take 10 mg by mouth daily.   nitroGLYCERIN (NITROSTAT) 0.4 MG SL tablet Place 1 tablet (0.4 mg total) under the tongue every 5 (five) minutes as needed for chest pain. For 3 doses only   pantoprazole (PROTONIX) 40 MG tablet Take 1 tablet (40 mg total) by mouth in the morning and at bedtime.   [DISCONTINUED] AMOXICILLIN PO Take 2 capsules by mouth in the morning and at bedtime.   No facility-administered encounter medications on file as of 05/22/2023.    Follow-up: Return in about 6 weeks (around 07/03/2023) for HTN and DM.   Anabel Halon, MD

## 2023-05-22 NOTE — Assessment & Plan Note (Signed)
S/p stent placement On aspirin, Plavix and statin Has easy bruising - advised to discuss if he can stop aspirin or Plavix (last stent > 1 year ago) Has nitroglycerin as needed for chest pain Followed by cardiology

## 2023-05-22 NOTE — Assessment & Plan Note (Addendum)
Lab Results  Component Value Date   HGBA1C 7.2 (H) 11/24/2022   Overall well controlled for his age Associated with HTN, CAD and HLD On glimepiride 1 mg daily Did not tolerate metformin Can be a good candidate for Jardiance or Farxiga, check CMP Advised to follow diabetic diet On statin and ACEi F/u CMP and lipid panel Diabetic foot exam: Today Diabetic eye exam: Advised to follow up with Ophthalmology for diabetic eye exam

## 2023-05-22 NOTE — Assessment & Plan Note (Signed)
Has chronic right shoulder pain Has had orthopedic surgery evaluation, is not operable Takes Norco as needed for severe pain, needs it rarely

## 2023-05-22 NOTE — Assessment & Plan Note (Signed)
On Lipitor 80 mg daily. 

## 2023-05-22 NOTE — Patient Instructions (Addendum)
Please start taking Lisinopril 10 mg once daily for blood pressure.  Please continue taking half tablet of Glimepiride for diabetes.  Please continue to take medications as prescribed.  Please continue to follow low carb diet and perform moderate exercise/walking as tolerated.  Please get fasting blood tests done before the next visit.

## 2023-05-22 NOTE — Assessment & Plan Note (Signed)
BP Readings from Last 1 Encounters:  05/22/23 (!) 158/70   Uncontrolled Advised to take lisinopril 10 mg daily regularly instead of as needed Counseled for compliance with the medications Advised DASH diet and moderate exercise/walking as tolerated

## 2023-06-08 ENCOUNTER — Ambulatory Visit: Payer: Medicare HMO | Attending: Student | Admitting: Student

## 2023-06-08 ENCOUNTER — Encounter: Payer: Self-pay | Admitting: Student

## 2023-06-08 VITALS — BP 122/60 | HR 50 | Ht 70.0 in | Wt 189.4 lb

## 2023-06-08 DIAGNOSIS — I10 Essential (primary) hypertension: Secondary | ICD-10-CM

## 2023-06-08 DIAGNOSIS — R002 Palpitations: Secondary | ICD-10-CM | POA: Diagnosis not present

## 2023-06-08 DIAGNOSIS — I2510 Atherosclerotic heart disease of native coronary artery without angina pectoris: Secondary | ICD-10-CM

## 2023-06-08 DIAGNOSIS — E785 Hyperlipidemia, unspecified: Secondary | ICD-10-CM

## 2023-06-08 NOTE — Patient Instructions (Signed)
Medication Instructions:  Your physician recommends that you continue on your current medications as directed. Please refer to the Current Medication list given to you today.  Monitor your Blood Pressure for 2 weeks and return readings to our office.   *If you need a refill on your cardiac medications before your next appointment, please call your pharmacy*   Lab Work: NONE   If you have labs (blood work) drawn today and your tests are completely normal, you will receive your results only by: MyChart Message (if you have MyChart) OR A paper copy in the mail If you have any lab test that is abnormal or we need to change your treatment, we will call you to review the results.   Testing/Procedures: NONE    Follow-Up: At [facility], you and your health needs are our priority.  As part of our continuing mission to provide you with exceptional heart care, we have created designated Provider Care Teams.  These Care Teams include your primary Cardiologist (physician) and Advanced Practice Providers (APPs -  Physician Assistants and Nurse Practitioners) who all work together to provide you with the care you need, when you need it.  We recommend signing up for the patient portal called "MyChart".  Sign up information is provided on this After Visit Summary.  MyChart is used to connect with patients for Virtual Visits (Telemedicine).  Patients are able to view lab/test results, encounter notes, upcoming appointments, etc.  Non-urgent messages can be sent to your provider as well.   To learn more about what you can do with MyChart, go to [email or web address].    Your next appointment:   6 month(s)  Provider:   You may see [name], MD or one of the following Advanced Practice Providers on your designated Care Team:   Randall An, PA-C  Jacolyn Reedy, PA-C     Other Instructions Thank you for choosing [facility]!

## 2023-06-08 NOTE — Progress Notes (Unsigned)
Cardiology Office Note    Date:  06/10/2023  ID:  Shawn Lopez June 25, 1941, MRN [id number] Cardiologist: [name], MD    History of Present Illness:    Shawn Lopez is a 82 y.o. male with past medical history of CAD (s/p stenting to LAD and OM1 in 12/2016 with patent stents by cath in 03/2019, low-risk NST's in 01/2021 and 11/2022), palpitations (PAC's and PVC's with brief SVT by prior monitor), HTN, HLD and Type II DM who presents to the office today for hospital follow-up.  He was most recently admitted to [facility] last month for evaluation of dizziness and diaphoresis.  He denied any recent chest pain or dyspnea on exertion. He had recent been started on antibiotic therapy for H. pylori and reported symptoms started around the timeframe of starting antibiotics. Did report brief palpitations around that timeframe. Hs troponin values were mildly elevated, peaking at 50. An echocardiogram was obtained and showed a preserved EF of 60 to 65% with no regional wall motion abnormalities. He did have grade 1 diastolic dysfunction, normal RV function and trivial MR.  Symptoms were felt to be atypical for angina and further ischemic testing was not pursued. It was recommended that if he had repeat symptoms, could consider a follow-up monitor and if a high SVT burden, may need to consider antiarrhythmic therapy.  In talking with the patient today, he reports overall doing well since his recent hospitalization. 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 Denies any recurrent episodes of dizziness or diaphoresis resembling what he experienced prior to his hospitalization. No recent chest pain or dyspnea on exertion. No specific orthopnea, PND or pitting edema. Does report occasional palpitations but no persistent symptoms. Says his blood pressure has been elevated in the evening hours but he typically checks this upon coming home or while  sitting in his truck at a job site.   Studies Reviewed:   EKG: EKG is not ordered today.   NST: 11/2022   The study is normal. There are no perfusion defects The study is low risk.   No ST deviation was noted.   LV perfusion is normal.   Left ventricular function is normal. Nuclear stress EF: 64 %. The left ventricular ejection fraction is normal (55-65%). End diastolic cavity size is normal.  Event Monitor: 02/2023 ZIO monitor reviewed.  13 days, 22 hours analyzed.   Predominant rhythm is sinus with heart rate ranging from 41 bpm up to 106 bpm and average heart rate 53 bpm. There were rare PACs including atrial couplets and triplets representing less than 1% total beats. There were rare PVCs including ventricular couplets representing less than 1% total beats. Several very brief episodes of SVT were noted, the longest of which was only 6 beats. No sustained arrhythmias, pauses, or heart block.  Echocardiogram: 04/2023 IMPRESSIONS     1. Left ventricular ejection fraction, by estimation, is 60 to 65%. The  left ventricle has normal function. The left ventricle has no regional  wall motion abnormalities. Left ventricular diastolic parameters are  consistent with Grade I diastolic  dysfunction (impaired relaxation).   2. Right ventricular systolic function is normal. The right ventricular  size is normal. There is normal pulmonary artery systolic pressure.   3. The mitral valve is normal in structure. Trivial mitral valve  regurgitation. No evidence of mitral stenosis.   4. The tricuspid valve is abnormal.   5. The aortic valve  is tricuspid. There is mild calcification of the  aortic valve. There is mild thickening of the aortic valve. Aortic valve  regurgitation is not visualized. No aortic stenosis is present.   6. The inferior vena cava is normal in size with greater than 50%  respiratory variability, suggesting right atrial pressure of 3 mmHg.    Physical Exam:   VS:  BP  122/60   Pulse (!) 50   Ht 5\' 10"  (1.778 m)   Wt 189 lb 6.4 oz (85.9 kg)   SpO2 95%   BMI 27.18 kg/m    Wt Readings from Last 3 Encounters:  06/08/23 189 lb 6.4 oz (85.9 kg)  05/22/23 182 lb 3.2 oz (82.6 kg)  05/10/23 181 lb (82.1 kg)     GEN: Pleasant, elderly male appearing in no acute distress NECK: No JVD; No carotid bruits CARDIAC: RRR, no murmurs, rubs, gallops RESPIRATORY:  Clear to auscultation without rales, wheezing or rhonchi  ABDOMEN: Appears non-distended. No obvious abdominal masses. EXTREMITIES: No clubbing or cyanosis. No pitting edema.  Distal pedal pulses are 2+ bilaterally.   Assessment and Plan:   1. CAD - He underwent stenting to the LAD and OM1 in 12/2016 and did have patent stents by prior catheterization in 2020. Most recent ischemic evaluation was a low-risk NST in 11/2022. - He remains active at baseline for his age and denies any recent anginal symptoms. Continue ASA 81 mg daily, Plavix 75 mg daily, Lisinopril 10 mg daily and Atorvastatin 80 mg daily. It was previously recommended to continue indefinite DAPT with ASA and Plavix given the long stented segment along his LAD.  2. Palpitations - Recent monitor in 02/2023 showed predominantly normal sinus rhythm with rare PAC's and PVC's and only brief SVT.  - He does report brief palpitations but no persistent symptoms. He has not been on AV nodal blocking agents given his baseline bradycardia. No indication for repeat monitoring at this time.  3. HTN - BP is at 122/60 today but has been elevated at times when checked at home. Suspect this is due to him checking his blood pressure when not sitting down and resting. We did review the proper way to check BP. Will continue Lisinopril 10 mg daily for now and have him report back with BP readings in several weeks. This can be further titrated if needed.  4. HLD - LDL was at 56 earlier this year but elevated to 82 when checked in 04/2023. He had been off statin  therapy temporarily and was restarted on Atorvastatin 80mg  daily during admission. He is scheduled for repeat labs with his PCP.   Signed, Shawn Lennox, PA-C

## 2023-06-10 ENCOUNTER — Encounter: Payer: Self-pay | Admitting: Student

## 2023-06-24 LAB — MICROALBUMIN / CREATININE URINE RATIO: Creatinine, Urine: 58.7 mg/dL

## 2023-06-24 LAB — HEMOGLOBIN A1C
Est. average glucose Bld gHb Est-mCnc: 131 mg/dL
Hgb A1c MFr Bld: 6.2 % — ABNORMAL HIGH (ref 4.8–5.6)

## 2023-06-24 LAB — CBC WITH DIFFERENTIAL/PLATELET
Basophils Absolute: 0 10*3/uL (ref 0.0–0.2)
Basos: 1 %
EOS (ABSOLUTE): 0.3 10*3/uL (ref 0.0–0.4)
Eos: 4 %
Hematocrit: 46.5 % (ref 37.5–51.0)
Hemoglobin: 14.8 g/dL (ref 13.0–17.7)
Immature Grans (Abs): 0 10*3/uL (ref 0.0–0.1)
Immature Granulocytes: 0 %
Lymphocytes Absolute: 2 10*3/uL (ref 0.7–3.1)
Lymphs: 26 %
MCH: 30.3 pg (ref 26.6–33.0)
MCHC: 31.8 g/dL (ref 31.5–35.7)
MCV: 95 fL (ref 79–97)
Monocytes Absolute: 0.6 10*3/uL (ref 0.1–0.9)
Monocytes: 9 %
Neutrophils Absolute: 4.5 10*3/uL (ref 1.4–7.0)
Neutrophils: 60 %
Platelets: 183 10*3/uL (ref 150–450)
RBC: 4.88 x10E6/uL (ref 4.14–5.80)
RDW: 12.7 % (ref 11.6–15.4)
WBC: 7.4 10*3/uL (ref 3.4–10.8)

## 2023-06-24 LAB — CMP14+EGFR
ALT: 26 [IU]/L (ref 0–44)
AST: 23 [IU]/L (ref 0–40)
Albumin: 4.2 g/dL (ref 3.7–4.7)
Alkaline Phosphatase: 123 [IU]/L — ABNORMAL HIGH (ref 44–121)
BUN/Creatinine Ratio: 21 (ref 10–24)
BUN: 17 mg/dL (ref 8–27)
Bilirubin Total: 0.6 mg/dL (ref 0.0–1.2)
CO2: 23 mmol/L (ref 20–29)
Calcium: 9.6 mg/dL (ref 8.6–10.2)
Chloride: 104 mmol/L (ref 96–106)
Creatinine, Ser: 0.8 mg/dL (ref 0.76–1.27)
Globulin, Total: 2.3 g/dL (ref 1.5–4.5)
Glucose: 125 mg/dL — ABNORMAL HIGH (ref 70–99)
Potassium: 4.9 mmol/L (ref 3.5–5.2)
Sodium: 142 mmol/L (ref 134–144)
Total Protein: 6.5 g/dL (ref 6.0–8.5)
eGFR: 89 mL/min/{1.73_m2} (ref >59)

## 2023-06-24 LAB — LIPID PANEL
Chol/HDL Ratio: 3.1 {ratio} (ref 0.0–5.0)
Cholesterol, Total: 116 mg/dL (ref 100–199)
HDL: 38 mg/dL — ABNORMAL LOW (ref >39)
LDL Chol Calc (NIH): 59 mg/dL (ref 0–99)
Triglycerides: 102 mg/dL (ref 0–149)
VLDL Cholesterol Cal: 19 mg/dL (ref 5–40)

## 2023-07-10 ENCOUNTER — Ambulatory Visit (INDEPENDENT_AMBULATORY_CARE_PROVIDER_SITE_OTHER): Payer: Medicare HMO | Admitting: Gastroenterology

## 2023-07-10 ENCOUNTER — Encounter (INDEPENDENT_AMBULATORY_CARE_PROVIDER_SITE_OTHER): Payer: Self-pay | Admitting: Gastroenterology

## 2023-07-10 VITALS — BP 114/64 | HR 62 | Temp 97.8°F | Ht 70.0 in | Wt 188.6 lb

## 2023-07-10 DIAGNOSIS — A048 Other specified bacterial intestinal infections: Secondary | ICD-10-CM | POA: Diagnosis not present

## 2023-07-10 DIAGNOSIS — R1011 Right upper quadrant pain: Secondary | ICD-10-CM | POA: Insufficient documentation

## 2023-07-10 DIAGNOSIS — R109 Unspecified abdominal pain: Secondary | ICD-10-CM | POA: Diagnosis not present

## 2023-07-10 DIAGNOSIS — K219 Gastro-esophageal reflux disease without esophagitis: Secondary | ICD-10-CM

## 2023-07-10 MED ORDER — PANTOPRAZOLE SODIUM 40 MG PO TBEC: 40.0000 mg | DELAYED_RELEASE_TABLET | Freq: Every day | ORAL | 3 refills | Status: DC

## 2023-07-10 NOTE — Progress Notes (Signed)
Referring Provider: [name], MD Primary Care Physician:  Anabel Halon, MD Primary GI Physician: Dr. Tasia Catchings   Chief Complaint  Patient presents with   Weight Loss    Follow up on weight loss. States appetite is good.    Abdominal Pain    Has some tenderness in mid abdomen when pressing on it. Noticed it yesterday. Has been out of pantoprazole for about one week.    HPI:   Shawn Lopez is a 82 y.o. male with past medical history of  HTN, HLD, DM, chronic pain, CAD s/p PCI 2018 on DAPT   Patient presenting today for follow up of weight loss, constipation, GERD, H pylori and dysphagia   Last seen July 2024, at that time c/o weight loss and constipation. Having a BM every 3-5 days with hard to looser stools. Down 20 lbs over the last 5 years. Having some dysphagia with solid foods as well, better on PPI   Recommended continue PPI, EGD, constipation, miralax BID and high fiber diet  EGD and Colonoscopy as outlined below, H pylori present, treated with amoxil/biaxin/PPI x14 days  Present:  States he is doing well. Ran out of his protonix about 1 week ago and having some heartburn indigestion. Dysphagia has resolved. He notes a "sore spot" in his Right mid abdomen that began yesterday. Otherwise no abdominal pain unless he presses on his abdomen. No nausea or vomiting   He did complete the h pylori treatment. He notes he had some issues witht he antibiotics causing him to be jittery but he took them to completion.   He is not taking anything for his constipation at this time. Notes he did not have a BM for about 2 weeks after colonoscopy, now having a BM daily without issues. No rectal bleeding or melena.   He has gained a few pounds since his last visit. He reports appetite is good.    CT Chest 2024 IMPRESSION: 1. No acute pulmonary embolism. 2. Cardiomegaly with minimal interlobular septal thickening suggesting mild pulmonary edema.   CT Abdomen and pelvis  2022 1. No acute intra-abdominal process. 2. Aortic Atherosclerosis (ICD10-I70.0).  Last Colonoscopy:03/2023- Ten 2 to 9 mm polyps in the descending colon and                            in the transverse colon, removed with a cold snare.                            Resected and retrieved.                           - One 4 mm polyp in the transverse colon, removed                            with a jumbo cold forceps. Resected and retrieved.                            Clip (MR conditional) was placed. 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                           -  Non-bleeding external internal hemorrhoids.                           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 (11 polyps, all TAs) repeat Colonoscopy 1 year  Last Endoscopy:03/2023- No endoscopic esophageal abnormality to explain                            patient's dysphagia.                           - Erythematous mucosa in the stomach. Biopsied.                           - Normal duodenal bulb and second portion of the                            duodenum.                           - Z-line regular, 36 cm from the incisors.                           - Biopsies were taken with a cold forceps for                            evaluation of eosinophilic esophagitis. H pylori, treated with  Recommendations:    Past Medical History:  Diagnosis Date   Arthritis    Atrial fibrillation (HCC) 2006   a. in 2006; not on anticoagulation, no recent atrial fib seen as of 2018.   Borderline hypertension    CAD (coronary artery disease)    a. NSTEMI 12/2016: cath showing 95% mid-LAD stenosis, 75% prox-LAD stenosis and 75% 1st Diag stenosis. DES to the proximal and mid-LAD along with the 1st Diagonal..   Chronic back pain    Prior trauma with vertebral fracture   Hyperlipidemia    Hypertension    Osteopenia    Sinus bradycardia    Type 2 diabetes mellitus [facility])     Past Surgical History:  Procedure  Laterality Date   APPENDECTOMY  1960   BIOPSY  04/20/2023   Procedure: BIOPSY;  Surgeon: Franky Macho, MD;  Location: [facility];  Service: Endoscopy;;   CATARACT EXTRACTION  2012   right eye,lens implantation   CHOLECYSTECTOMY     CIRCUMCISION  2010   meatotomy and dilatation   COLONOSCOPY N/A 01/14/2013   Procedure: COLONOSCOPY;  Surgeon: Dalia Heading, MD;  Location: [facility];  Service: Gastroenterology;  Laterality: N/A;   COLONOSCOPY WITH PROPOFOL N/A 04/20/2023   Procedure: COLONOSCOPY WITH PROPOFOL;  Surgeon: Franky Macho, MD;  Location: [facility];  Service: Endoscopy;  Laterality: N/A;  7:30am;asa 3   CORONARY ANGIOGRAPHY N/A 01/15/2017   Procedure: Coronary Angiography;  Surgeon: Runell Gess, MD;  Location: [facility] INVASIVE CV LAB;  Service: Cardiovascular;  Laterality: N/A;   CORONARY PRESSURE/FFR STUDY N/A 04/04/2019   Procedure: INTRAVASCULAR PRESSURE WIRE/FFR STUDY;  Surgeon: Yvonne Kendall, MD;  Location: [facility] INVASIVE CV LAB;  Service: Cardiovascular;  Laterality: N/A;   CORONARY STENT INTERVENTION N/A 01/15/2017   Procedure: Coronary Stent Intervention;  Surgeon: Delton See  Allyson Sabal, MD;  Location: [facility] INVASIVE CV LAB;  Service: Cardiovascular;  Laterality: N/A;   ESOPHAGOGASTRODUODENOSCOPY (EGD) WITH PROPOFOL N/A 04/20/2023   Procedure: ESOPHAGOGASTRODUODENOSCOPY (EGD) WITH PROPOFOL;  Surgeon: Franky Macho, MD;  [facility]: [facility];  Service: Endoscopy;  Laterality: N/A;  7:30am;asa 3   LEFT HEART CATH AND CORONARY ANGIOGRAPHY N/A 04/04/2019   Procedure: LEFT HEART CATH AND CORONARY ANGIOGRAPHY;  Surgeon: Yvonne Kendall, MD;  Location: [facility] INVASIVE CV LAB;  Service: Cardiovascular;  Laterality: N/A;   POLYPECTOMY  04/20/2023   Procedure: POLYPECTOMY;  Surgeon: Franky Macho, MD;  Location: [facility];  Service: Endoscopy;;    Current Outpatient Medications  Medication Sig Dispense Refill   aspirin EC 81 MG tablet Take 1 tablet (81 mg total) by  mouth daily with breakfast. 90 tablet 4   atorvastatin (LIPITOR) 80 MG tablet Take 1 tablet (80 mg total) by mouth every evening. 90 tablet 3   clopidogrel (PLAVIX) 75 MG tablet Take 1 tablet (75 mg total) by mouth daily. 90 tablet 3   glimepiride (AMARYL) 2 MG tablet Take 1 mg by mouth daily with breakfast.     HYDROcodone-acetaminophen (NORCO) 10-325 MG tablet Take 1 tablet by mouth every 8 (eight) hours as needed for moderate pain or severe pain.     lisinopril (ZESTRIL) 10 MG tablet Take 10 mg by mouth daily.     nitroGLYCERIN (NITROSTAT) 0.4 MG SL tablet Place 1 tablet (0.4 mg total) under the tongue every 5 (five) minutes as needed for chest pain. For 3 doses only 30 tablet 0   pantoprazole (PROTONIX) 40 MG tablet Take 1 tablet (40 mg total) by mouth in the morning and at bedtime. 30 tablet 3   No current facility-administered medications for this visit.    Allergies as of 07/10/2023 - Review Complete 07/10/2023  Allergen Reaction Noted   Brilinta [ticagrelor]  03/09/2017   Amlodipine  04/12/2023   Other Other (See Comments) 11/24/2022    Family History  Problem Relation Age of Onset   CAD Father    Heart attack Brother     Social History   Socioeconomic History   Marital status: Married    Spouse name: Not on file   Number of children: 4   Years of education: Not on file   Highest education level: Not on file  Occupational History   Occupation: Holiday representative    Comment: [employer]  Tobacco Use   Smoking status: Former    Current packs/day: 0.00    Average packs/day: 1.5 packs/day for 15.0 years (22.5 ttl pk-yrs)    Types: Cigarettes    Start date: 04/20/1982    Quit date: 04/20/1997    Years since quitting: 26.2    Passive exposure: Past   Smokeless tobacco: Never  Vaping Use   Vaping status: Never Used  Substance and Sexual Activity   Alcohol use: No   Drug use: No   Sexual activity: Not on file  Other Topics Concern   Not on file  Social  History Narrative   Not on file   Social Determinants of Health   Financial Resource Strain: Not on file  Food Insecurity: No Food Insecurity (05/11/2023)   Hunger Vital Sign    Worried About Running Out of Food in the Last Year: Never true    Ran Out of Food in the Last Year: Never true  Transportation Needs: No Transportation Needs (05/10/2023)   PRAPARE - Transportation    Lack of  Transportation (Medical): No    Lack of Transportation (Non-Medical): No  Physical Activity: Not on file  Stress: Not on file  Social Connections: Not on file   Review of systems General: negative for malaise, night sweats, fever, chills, weight loss Neck: Negative for lumps, goiter, pain and significant neck swelling Resp: Negative for cough, wheezing, dyspnea at rest CV: Negative for chest pain, leg swelling, palpitations, orthopnea GI: denies melena, hematochezia, nausea, vomiting, diarrhea, constipation, dysphagia, odyonophagia, early satiety or unintentional weight loss. +soreness to R mid abdomen +occasional GERD off PPI  The remainder of the review of systems is noncontributory.  Physical Exam: BP 114/64 (BP Location: Left Arm, Patient Position: Sitting, Cuff Size: Normal)   Pulse 62   Temp 97.8 F (36.6 C) (Oral)   Ht 5\' 10"  (1.778 m)   Wt 188 lb 9.6 oz (85.5 kg)   BMI 27.06 kg/m  General:   Alert and oriented. No distress noted. Pleasant and cooperative.  Head:  Normocephalic and atraumatic. Eyes:  Conjuctiva clear without scleral icterus. Mouth:  Oral mucosa pink and moist. Good dentition. No lesions. Heart: Normal rate and rhythm, s1 and s2 heart sounds present.  Lungs: Clear lung sounds in all lobes. Respirations equal and unlabored. Abdomen:  +BS, soft, non-tender and non-distended. No rebound or guarding. No HSM or masses noted. Neurologic:  Alert and  oriented x4 Psych:  Alert and cooperative. Normal mood and affect.  Invalid input(s): "6 MONTHS"   ASSESSMENT: Shawn Lopez is a 82 y.o. male presenting today for follow up of GERD, constipation, weight loss, dysphagia and H pylori   H pylori: positive h pylori testing on EGD in July, he completed biaxin, amoxil/PPI BID x14 days. Notably ran out of PPI 1 week ago. We will hold PPI 1 more week and complete H pylori breath testing to confirm eradication. I discussed with patient to continue to hold PPI for a total of 2 weeks, complete H pylori testing then resume daily dosing of protonix 40mg  daily.   GERD/Dysphagia: dysphagia resolved since EGD. GERD well controlled on protonix 40mg  daily. As above, will hold PPI for another week for H pylori testing then resume PPI daily thereafter.  Abdominal soreness: just right of umbilicus. Began yesterday. No associated symptoms. abdominal exam is unremarkable. Could be secondary to being off of PPI, repeat H pylori testing and resumption of PPI thereafter, as above, if he has worsening of pain or associated symptoms, he should let me know.   Weight loss: he has gained some weight since his last OV. Appetite is good. Recent endoscopic evaluations as above. No further evaluation needed at this time.   Constipation: resolved after colonoscopy. Having 1 BM per day, not taking anything currently. Should continue with good water intake, aim for atleast 64 oz per day Increase fruits, veggies and whole grains, kiwi and prunes are especially good for constipation  Colonoscopy in July 2024 with 11 TAs, due for repeat colonoscopy again in July 2025.    PLAN:  H pylori breath test, off PPI x2 weeks  2. Continue with good water intake, diet high in fruits, veggies, whole grains  3. Repeat Colonoscopy July 2025 4. Resume daily protonix 40mg  after H pylori testing 5. Pt to let me know of worsening abdominal pain/associated symptoms  All questions were answered, patient verbalized understanding and is in agreement with plan as outlined above.   Follow Up: 3-4 months   Donielle Radziewicz  L. Jeanmarie Hubert, MSN, APRN, AGNP-C Adult-Gerontology Nurse Practitioner  [facility]

## 2023-07-10 NOTE — Patient Instructions (Signed)
Please stay off of your protonix (pantoprazole for another week, 2 weeks total) then complete the H pylori breath test at the lab Once this test is completed, please resume your pantoprazole 40mg  once daily, in the morning 30 minutes prior to breakfast Increase water intake, aim for atleast 64 oz per day Increase fruits, veggies and whole grains, kiwi and prunes are especially good for constipation  We will plan for repeat Colonoscopy in July 2025  Follow up 3-4 months  It was a pleasure to see you today. I want to create trusting relationships with patients and provide genuine, compassionate, and quality care. I truly value your feedback! please be on the lookout for a survey regarding your visit with me today. I appreciate your input about our visit and your time in completing this!    Amillya Chavira L. Jeanmarie Hubert, MSN, APRN, AGNP-C Adult-Gerontology Nurse Practitioner [facility] at [facility]

## 2023-07-13 ENCOUNTER — Ambulatory Visit: Payer: Medicare HMO | Admitting: Internal Medicine

## 2023-07-13 ENCOUNTER — Encounter: Payer: Self-pay | Admitting: Internal Medicine

## 2023-07-13 VITALS — BP 138/66 | HR 62 | Ht 70.0 in | Wt 186.2 lb

## 2023-07-13 DIAGNOSIS — K219 Gastro-esophageal reflux disease without esophagitis: Secondary | ICD-10-CM

## 2023-07-13 DIAGNOSIS — E1169 Type 2 diabetes mellitus with other specified complication: Secondary | ICD-10-CM | POA: Diagnosis not present

## 2023-07-13 DIAGNOSIS — I10 Essential (primary) hypertension: Secondary | ICD-10-CM

## 2023-07-13 DIAGNOSIS — Z23 Encounter for immunization: Secondary | ICD-10-CM | POA: Diagnosis not present

## 2023-07-13 DIAGNOSIS — Z7984 Long term (current) use of oral hypoglycemic drugs: Secondary | ICD-10-CM

## 2023-07-13 DIAGNOSIS — I25118 Atherosclerotic heart disease of native coronary artery with other forms of angina pectoris: Secondary | ICD-10-CM

## 2023-07-13 MED ORDER — LISINOPRIL 10 MG PO TABS
10.0000 mg | ORAL_TABLET | Freq: Every day | ORAL | 1 refills | Status: DC
Start: 2023-07-13 — End: 2024-02-20

## 2023-07-13 MED ORDER — EMPAGLIFLOZIN 10 MG PO TABS: 10.0000 mg | ORAL_TABLET | Freq: Every day | ORAL | 3 refills | Status: DC

## 2023-07-13 NOTE — Progress Notes (Signed)
Established Patient Office Visit  Subjective:  Patient ID: Shawn Lopez, male    DOB: 04-Jul-1941  Age: 82 y.o. MRN: [id number]  CC:  Chief Complaint  Patient presents with   Hypertension    Six week follow up    Diabetes    Six week follow up , patient thinks his sugar is low     HPI Shawn Lopez is a 82 y.o. male with past medical history of CAD s/p stent placement, HTN, type II DM, HLD and chronic shoulder pain who presents for f/u of his chronic medical conditions.  CAD status post stent placement and HTN: He takes aspirin, Plavix and statin.  His blood pressure is wnl today.  He is taking lisinopril 10 mg once daily regularly now. He denies any headache, dizziness, chest pain, dyspnea or palpitations currently.   Type II DM: He takes glimepiride 1 mg (half tablet of 2 mg) QD.  His last HbA1c was 6.2.  He used to have hypoglycemic episodes when he took glimepiride whole tablet.  Denies any polyuria or polyphagia currently.  GERD: He is taking pantoprazole 40 mg daily currently.  He recently had EGD in 07/24, which showed H. pylori infection.  He has completed  triple therapy for it.  Denies any nausea, vomiting, dysphagia or odynophagia currently.    Past Medical History:  Diagnosis Date   Arthritis    Atrial fibrillation (HCC) 2006   a. in 2006; not on anticoagulation, no recent atrial fib seen as of 2018.   Borderline hypertension    CAD (coronary artery disease)    a. NSTEMI 12/2016: cath showing 95% mid-LAD stenosis, 75% prox-LAD stenosis and 75% 1st Diag stenosis. DES to the proximal and mid-LAD along with the 1st Diagonal..   Chronic back pain    Prior trauma with vertebral fracture   Hyperlipidemia    Hypertension    Osteopenia    Sinus bradycardia    Type 2 diabetes mellitus [facility])     Past Surgical History:  Procedure Laterality Date   APPENDECTOMY  1960   BIOPSY  04/20/2023   Procedure: BIOPSY;  Surgeon: Franky Macho, MD;  Location: [facility]  ENDO SUITE;  Service: Endoscopy;;   CATARACT EXTRACTION  2012   right eye,lens implantation   CHOLECYSTECTOMY     CIRCUMCISION  2010   meatotomy and dilatation   COLONOSCOPY N/A 01/14/2013   Procedure: COLONOSCOPY;  Surgeon: Dalia Heading, MD;  Location: [facility] ENDO SUITE;  Service: Gastroenterology;  Laterality: N/A;   COLONOSCOPY WITH PROPOFOL N/A 04/20/2023   Procedure: COLONOSCOPY WITH PROPOFOL;  Surgeon: Franky Macho, MD;  Location: [facility];  Service: Endoscopy;  Laterality: N/A;  7:30am;asa 3   CORONARY ANGIOGRAPHY N/A 01/15/2017   Procedure: Coronary Angiography;  Surgeon: Runell Gess, MD;  Location: [facility] INVASIVE CV LAB;  Service: Cardiovascular;  Laterality: N/A;   CORONARY PRESSURE/FFR STUDY N/A 04/04/2019   Procedure: INTRAVASCULAR PRESSURE WIRE/FFR STUDY;  Surgeon: Yvonne Kendall, MD;  Location: [facility] INVASIVE CV LAB;  Service: Cardiovascular;  Laterality: N/A;   CORONARY STENT INTERVENTION N/A 01/15/2017   Procedure: Coronary Stent Intervention;  Surgeon: Runell Gess, MD;  Location: [facility] INVASIVE CV LAB;  Service: Cardiovascular;  Laterality: N/A;   ESOPHAGOGASTRODUODENOSCOPY (EGD) WITH PROPOFOL N/A 04/20/2023   Procedure: ESOPHAGOGASTRODUODENOSCOPY (EGD) WITH PROPOFOL;  Surgeon: Franky Macho, MD;  Location: [facility];  Service: Endoscopy;  Laterality: N/A;  7:30am;asa 3   LEFT HEART CATH AND CORONARY ANGIOGRAPHY N/A  04/04/2019   Procedure: LEFT HEART CATH AND CORONARY ANGIOGRAPHY;  Surgeon: Yvonne Kendall, MD;  Location: [facility] INVASIVE CV LAB;  Service: Cardiovascular;  Laterality: N/A;   POLYPECTOMY  04/20/2023   Procedure: POLYPECTOMY;  Surgeon: Franky Macho, MD;  Location: [facility] END[facility];  Service: Endoscopy;;    Family History  Problem Relation Age of Onset   CAD Father    Heart attack Brother     Social History   Socioeconomic History   Marital status: Married    Spouse name: Not on file   Number of children: 4   Years of education: Not on file    Highest education level: Not on file  Occupational History   Occupation: Holiday representative    Comment: [employer]  Tobacco Use   Smoking status: Former    Current packs/day: 0.00    Average packs/day: 1.5 packs/day for 15.0 years (22.5 ttl pk-yrs)    Types: Cigarettes    Start date: 04/20/1982    Quit date: 04/20/1997    Years since quitting: 26.2    Passive exposure: Past   Smokeless tobacco: Never  Vaping Use   Vaping status: Never Used  Substance and Sexual Activity   Alcohol use: No   Drug use: No   Sexual activity: Not on file  Other Topics Concern   Not on file  Social History Narrative   Not on file   Social Determinants of Health   Financial Resource Strain: Not on file  Food Insecurity: No Food Insecurity (05/11/2023)   Hunger Vital Sign    Worried About Running Out of Food in the Last Year: Never true    Ran Out of Food in the Last Year: Never true  Transportation Needs: No Transportation Needs (05/10/2023)   PRAPARE - Administrator, Civil Service (Medical): No    Lack of Transportation (Non-Medical): No  Physical Activity: Not on file  Stress: Not on file  Social Connections: Not on file  Intimate Partner Violence: Not At Risk (05/10/2023)   Humiliation, Afraid, Rape, and Kick questionnaire    Fear of Current or Ex-Partner: No    Emotionally Abused: No    Physically Abused: No    Sexually Abused: No    Outpatient Medications Prior to Visit  Medication Sig Dispense Refill   aspirin EC 81 MG tablet Take 1 tablet (81 mg total) by mouth daily with breakfast. 90 tablet 4   atorvastatin (LIPITOR) 80 MG tablet Take 1 tablet (80 mg total) by mouth every evening. 90 tablet 3   clopidogrel (PLAVIX) 75 MG tablet Take 1 tablet (75 mg total) by mouth daily. 90 tablet 3   HYDROcodone-acetaminophen (NORCO) 10-325 MG tablet Take 1 tablet by mouth every 8 (eight) hours as needed for moderate pain or severe pain.     nitroGLYCERIN (NITROSTAT) 0.4 MG  SL tablet Place 1 tablet (0.4 mg total) under the tongue every 5 (five) minutes as needed for chest pain. For 3 doses only 30 tablet 0   pantoprazole (PROTONIX) 40 MG tablet Take 1 tablet (40 mg total) by mouth daily. 90 tablet 3   glimepiride (AMARYL) 2 MG tablet Take 1 mg by mouth daily with breakfast.     lisinopril (ZESTRIL) 10 MG tablet Take 10 mg by mouth daily.     No facility-administered medications prior to visit.    Allergies  Allergen Reactions   Brilinta [Ticagrelor]     Dyspnea    Amlodipine  Hypotension    Other Other (See Comments)    Pt states antibiotics give him the shakes    ROS Review of Systems  Constitutional:  Negative for chills and fever.  HENT:  Negative for congestion and sore throat.   Eyes:  Negative for pain and discharge.  Respiratory:  Negative for cough and shortness of breath.   Cardiovascular:  Negative for chest pain and palpitations.  Gastrointestinal:  Negative for constipation, diarrhea, nausea and vomiting.  Endocrine: Negative for polydipsia and polyuria.  Genitourinary:  Negative for dysuria and hematuria.  Musculoskeletal:  Positive for arthralgias (R shoulder). Negative for neck pain and neck stiffness.  Skin:  Negative for rash.  Neurological:  Negative for dizziness, weakness, numbness and headaches.  Psychiatric/Behavioral:  Negative for agitation and behavioral problems.       Objective:    Physical Exam Vitals reviewed.  Constitutional:      General: He is not in acute distress.    Appearance: He is not diaphoretic.  HENT:     Head: Normocephalic and atraumatic.     Mouth/Throat:     Mouth: Mucous membranes are moist.  Eyes:     General: No scleral icterus.    Extraocular Movements: Extraocular movements intact.  Cardiovascular:     Rate and Rhythm: Normal rate and regular rhythm.     Heart sounds: Normal heart sounds. No murmur heard. Pulmonary:     Breath sounds: Normal breath sounds. No wheezing or rales.   Musculoskeletal:     Cervical back: Neck supple. No tenderness.     Right lower leg: No edema.     Left lower leg: No edema.  Skin:    General: Skin is warm.     Findings: No rash.  Neurological:     General: No focal deficit present.     Mental Status: He is alert and oriented to person, place, and time.     Sensory: No sensory deficit.     Motor: No weakness.  Psychiatric:        Mood and Affect: Mood normal.        Behavior: Behavior normal.     BP 138/66 (BP Location: Left Arm)   Pulse 62   Ht 5\' 10"  (1.778 m)   Wt 186 lb 3.2 oz (84.5 kg)   SpO2 96%   BMI 26.72 kg/m  Wt Readings from Last 3 Encounters:  07/13/23 186 lb 3.2 oz (84.5 kg)  07/10/23 188 lb 9.6 oz (85.5 kg)  06/08/23 189 lb 6.4 oz (85.9 kg)    Lab Results  Component Value Date   TSH 1.724 06/11/2019   Lab Results  Component Value Date   WBC 7.4 06/22/2023   HGB 14.8 06/22/2023   HCT 46.5 06/22/2023   MCV 95 06/22/2023   PLT 183 06/22/2023   Lab Results  Component Value Date   NA 142 06/22/2023   K 4.9 06/22/2023   CO2 23 06/22/2023   GLUCOSE 125 (H) 06/22/2023   BUN 17 06/22/2023   CREATININE 0.80 06/22/2023   BILITOT 0.6 06/22/2023   ALKPHOS 123 (H) 06/22/2023   AST 23 06/22/2023   ALT 26 06/22/2023   PROT 6.5 06/22/2023   ALBUMIN 4.2 06/22/2023   CALCIUM 9.6 06/22/2023   ANIONGAP 10 05/10/2023   EGFR 89 06/22/2023   Lab Results  Component Value Date   CHOL 116 06/22/2023   Lab Results  Component Value Date   HDL 38 (L) 06/22/2023   Lab Results  Component Value Date   LDLCALC 59 06/22/2023   Lab Results  Component Value Date   TRIG 102 06/22/2023   Lab Results  Component Value Date   CHOLHDL 3.1 06/22/2023   Lab Results  Component Value Date   HGBA1C 6.2 (H) 06/22/2023      Assessment & Plan:   Problem List Items Addressed This Visit       Cardiovascular and Mediastinum   Essential hypertension    BP Readings from Last 1 Encounters:  07/13/23 138/66    Well-controlled Advised to take lisinopril 10 mg daily regularly Counseled for compliance with the medications Advised DASH diet and moderate exercise/walking as tolerated       Relevant Medications   lisinopril (ZESTRIL) 10 MG tablet   Other Relevant Orders   Basic Metabolic Panel (BMET)   CAD (coronary artery disease) with PCI 12/2016    S/p stent placement On aspirin, Plavix and statin Has easy bruising -had discussion with cardiology if he can stop aspirin or Plavix (last stent > 1 year ago), but due to his long segmented stent -recommend DAPT Has nitroglycerin as needed for chest pain Followed by cardiology      Relevant Medications   lisinopril (ZESTRIL) 10 MG tablet     Digestive   Gastroesophageal reflux disease without esophagitis    Continue pantoprazole 40 mg QD        Endocrine   Diabetes mellitus (HCC) - Primary    Lab Results  Component Value Date   HGBA1C 6.2 (H) 06/22/2023   Overall well controlled for his age Associated with HTN, CAD and HLD On glimepiride 1 mg daily, considering his age and tightly controlled glycemic profile, would avoid SUs Did not tolerate metformin Can be a good candidate for Jardiance considering his cardiac history- started Jardiance 10 mg QD, check CMP Advised to follow diabetic diet On statin and ACEi F/u CMP and lipid panel Diabetic foot exam: Today Diabetic eye exam: Advised to follow up with Ophthalmology for diabetic eye exam      Relevant Medications   empagliflozin (JARDIANCE) 10 MG TABS tablet   lisinopril (ZESTRIL) 10 MG tablet   Other Relevant Orders   Basic Metabolic Panel (BMET)   Hemoglobin A1c   Other Visit Diagnoses     Encounter for immunization       Relevant Orders   Pneumococcal conjugate vaccine 20-valent (Completed)       Meds ordered this encounter  Medications   empagliflozin (JARDIANCE) 10 MG TABS tablet    Sig: Take 1 tablet (10 mg total) by mouth daily before breakfast.     Dispense:  30 tablet    Refill:  3   lisinopril (ZESTRIL) 10 MG tablet    Sig: Take 1 tablet (10 mg total) by mouth daily.    Dispense:  90 tablet    Refill:  1    Follow-up: Return in about 4 months (around 11/13/2023) for DM and HTN.    Anabel Halon, MD

## 2023-07-13 NOTE — Assessment & Plan Note (Signed)
Continue pantoprazole 40 mg QD

## 2023-07-13 NOTE — Assessment & Plan Note (Addendum)
BP Readings from Last 1 Encounters:  07/13/23 138/66   Well-controlled Advised to take lisinopril 10 mg daily regularly Counseled for compliance with the medications Advised DASH diet and moderate exercise/walking as tolerated

## 2023-07-13 NOTE — Assessment & Plan Note (Addendum)
S/p stent placement On aspirin, Plavix and statin Has easy bruising -had discussion with cardiology if he can stop aspirin or Plavix (last stent > 1 year ago), but due to his long segmented stent -recommend DAPT Has nitroglycerin as needed for chest pain Followed by cardiology

## 2023-07-13 NOTE — Patient Instructions (Addendum)
Please start taking Jardiance 10 mg as prescribed. Stop taking Glimepiride.  Please continue to take medications as prescribed.  Please continue to follow low carb diet and perform moderate exercise/walking at least 150 mins/week.  Please get fasting blood tests done before the next visit.

## 2023-07-13 NOTE — Assessment & Plan Note (Addendum)
Lab Results  Component Value Date   HGBA1C 6.2 (H) 06/22/2023   Overall well controlled for his age Associated with HTN, CAD and HLD On glimepiride 1 mg daily, considering his age and tightly controlled glycemic profile, would avoid SUs Did not tolerate metformin Can be a good candidate for Jardiance considering his cardiac history- started Jardiance 10 mg QD, check CMP Advised to follow diabetic diet On statin and ACEi F/u CMP and lipid panel Diabetic foot exam: Today Diabetic eye exam: Advised to follow up with Ophthalmology for diabetic eye exam

## 2023-07-26 ENCOUNTER — Ambulatory Visit: Payer: Medicare HMO | Admitting: Cardiology

## 2023-07-27 ENCOUNTER — Other Ambulatory Visit (INDEPENDENT_AMBULATORY_CARE_PROVIDER_SITE_OTHER): Payer: Self-pay | Admitting: Gastroenterology

## 2023-07-29 LAB — H. PYLORI BREATH TEST: H pylori Breath Test: NEGATIVE

## 2023-08-06 ENCOUNTER — Other Ambulatory Visit: Payer: Self-pay | Admitting: Internal Medicine

## 2023-08-20 ENCOUNTER — Telehealth: Payer: Self-pay | Admitting: Internal Medicine

## 2023-08-20 NOTE — Telephone Encounter (Signed)
Spoke with patient to schedule his AWV he stated that this medication empagliflozin (JARDIANCE) 10 MG TABS tablet makes him very dizzy and that he has stopped taking it  Thank you,  Judeth Cornfield,  AMB Clinical Support [facility] AWV Program Direct Dial ??[phone]

## 2023-08-21 NOTE — Telephone Encounter (Signed)
Spoke to patient

## 2023-10-02 ENCOUNTER — Ambulatory Visit (INDEPENDENT_AMBULATORY_CARE_PROVIDER_SITE_OTHER): Payer: Medicare HMO

## 2023-10-02 VITALS — Ht 70.0 in | Wt 186.0 lb

## 2023-10-02 DIAGNOSIS — Z0000 Encounter for general adult medical examination without abnormal findings: Secondary | ICD-10-CM

## 2023-10-02 DIAGNOSIS — E1169 Type 2 diabetes mellitus with other specified complication: Secondary | ICD-10-CM

## 2023-10-02 DIAGNOSIS — Z0001 Encounter for general adult medical examination with abnormal findings: Secondary | ICD-10-CM

## 2023-10-02 DIAGNOSIS — Z0100 Encounter for examination of eyes and vision without abnormal findings: Secondary | ICD-10-CM

## 2023-10-02 NOTE — Patient Instructions (Signed)
 Shawn Lopez , Thank you for taking time to come for your Medicare Wellness Visit. I appreciate your ongoing commitment to your health goals. Please review the following plan we discussed and let me know if I can assist you in the future.   Referrals/Orders/Follow-Ups/Clinician Recommendations:  Jerrie been referred to see Dr. Adine Haddock for an eye exam. If you haven't heard from his office in about a week, please call to schedule your appointment.   Adine Haddock, MD [address] [place] [address]  Phone: [phone]  Next Medicare Annual Wellness Visit: October 06, 2024 at 10:40 am virtual visit  This is a list of the screening recommended for you and due dates:  Health Maintenance  Topic Date Due   Eye exam for diabetics  Never done   DTaP/Tdap/Td vaccine (1 - Tdap) Never done   Zoster (Shingles) Vaccine (1 of 2) Never done   COVID-19 Vaccine (1 - 2024-25 season) Never done   Hemoglobin A1C  12/20/2023   Colon Cancer Screening  04/19/2024   Yearly kidney function blood test for diabetes  06/21/2024   Yearly kidney health urinalysis for diabetes  06/21/2024   Complete foot exam   07/12/2024   Medicare Annual Wellness Visit  10/01/2024   Pneumonia Vaccine  Completed   Flu Shot  Completed   HPV Vaccine  Aged Out    Advanced directives: (Declined) Advance directive discussed with you today. Even though you declined this today, please call our office should you change your mind, and we can give you the proper paperwork for you to fill out.  Next Medicare Annual Wellness Visit scheduled for next year: Yes  Preventive Care 83 Years and Older, Male Preventive care refers to lifestyle choices and visits with your health care provider that can promote health and wellness. Preventive care visits are also called wellness exams. What can I expect for my preventive care visit? Counseling During your preventive care visit, your health care provider may ask about your: Medical  history, including: Past medical problems. Family medical history. History of falls. Current health, including: Emotional well-being. Home life and relationship well-being. Sexual activity. Memory and ability to understand (cognition). Lifestyle, including: Alcohol, nicotine or tobacco, and drug use. Access to firearms. Diet, exercise, and sleep habits. Work and work astronomer. Sunscreen use. Safety issues such as seatbelt and bike helmet use. Physical exam Your health care provider will check your: Height and weight. These may be used to calculate your BMI (body mass index). BMI is a measurement that tells if you are at a healthy weight. Waist circumference. This measures the distance around your waistline. This measurement also tells if you are at a healthy weight and may help predict your risk of certain diseases, such as type 2 diabetes and high blood pressure. Heart rate and blood pressure. Body temperature. Skin for abnormal spots. What immunizations do I need?  Vaccines are usually given at various ages, according to a schedule. Your health care provider will recommend vaccines for you based on your age, medical history, and lifestyle or other factors, such as travel or where you work. What tests do I need? Screening Your health care provider may recommend screening tests for certain conditions. This may include: Lipid and cholesterol levels. Diabetes screening. This is done by checking your blood sugar (glucose) after you have not eaten for a while (fasting). Hepatitis C test. Hepatitis B test. HIV (human immunodeficiency virus) test. STI (sexually transmitted infection) testing, if you are at  risk. Lung cancer screening. Colorectal cancer screening. Prostate cancer screening. Abdominal aortic aneurysm (AAA) screening. You may need this if you are a current or former smoker. Talk with your health care provider about your test results, treatment options, and if  necessary, the need for more tests. Follow these instructions at home: Eating and drinking  Eat a diet that includes fresh fruits and vegetables, whole grains, lean protein, and low-fat dairy products. Limit your intake of foods with high amounts of sugar, saturated fats, and salt. Take vitamin and mineral supplements as recommended by your health care provider. Do not drink alcohol if your health care provider tells you not to drink. If you drink alcohol: Limit how much you have to 0-2 drinks a day. Know how much alcohol is in your drink. In the [place] one drink equals one 12 oz bottle of beer (355 mL), one 5 oz glass of wine (148 mL), or one 1 oz glass of hard liquor (44 mL). Lifestyle Brush your teeth every morning and night with fluoride toothpaste. Floss one time each day. Exercise for at least 30 minutes 5 or more days each week. Do not use any products that contain nicotine or tobacco. These products include cigarettes, chewing tobacco, and vaping devices, such as e-cigarettes. If you need help quitting, ask your health care provider. Do not use drugs. If you are sexually active, practice safe sex. Use a condom or other form of protection to prevent STIs. Take aspirin  only as told by your health care provider. Make sure that you understand how much to take and what form to take. Work with your health care provider to find out whether it is safe and beneficial for you to take aspirin  daily. Ask your health care provider if you need to take a cholesterol-lowering medicine (statin). Find healthy ways to manage stress, such as: Meditation, yoga, or listening to music. Journaling. Talking to a trusted person. Spending time with friends and family. Safety Always wear your seat belt while driving or riding in a vehicle. Do not drive: If you have been drinking alcohol. Do not ride with someone who has been drinking. When you are tired or distracted. While texting. If you have been using  any mind-altering substances or drugs. Wear a helmet and other protective equipment during sports activities. If you have firearms in your house, make sure you follow all gun safety procedures. Minimize exposure to UV radiation to reduce your risk of skin cancer. What's next? Visit your health care provider once a year for an annual wellness visit. Ask your health care provider how often you should have your eyes and teeth checked. Stay up to date on all vaccines. This information is not intended to replace advice given to you by your health care provider. Make sure you discuss any questions you have with your health care provider. Document Revised: 03/09/2021 Document Reviewed: 03/09/2021 Elsevier Patient Education  2024 [employer]. Understanding Your Risk for Falls Millions of people have serious injuries from falls each year. It is important to understand your risk of falling. Talk with your health care provider about your risk and what you can do to lower it. If you do have a serious fall, make sure to tell your provider. Falling once raises your risk of falling again. How can falls affect me? Serious injuries from falls are common. These include: Broken bones, such as hip fractures. Head injuries, such as traumatic brain injuries (TBI) or concussions. A fear of falling can cause you  to avoid activities and stay at home. This can make your muscles weaker and raise your risk for a fall. What can increase my risk? There are a number of risk factors that increase your risk for falling. The more risk factors you have, the higher your risk of falling. Serious injuries from a fall happen most often to people who are older than 83 years old. Teenagers and young adults ages 30-29 are also at higher risk. Common risk factors include: Weakness in the lower body. Being generally weak or confused due to long-term (chronic) illness. Dizziness or balance problems. Poor vision. Medicines that cause  dizziness or drowsiness. These may include: Medicines for your blood pressure, heart, anxiety, insomnia, or swelling (edema). Pain medicines. Muscle relaxants. Other risk factors include: Drinking alcohol. Having had a fall in the past. Having foot pain or wearing improper footwear. Working at a dangerous job. Having any of the following in your home: Tripping hazards, such as floor clutter or loose rugs. Poor lighting. Pets. Having dementia or memory loss. What actions can I take to lower my risk of falling?     Physical activity Stay physically fit. Do strength and balance exercises. Consider taking a regular class to build strength and balance. Yoga and tai chi are good options. Vision Have your eyes checked every year and your prescription for glasses or contacts updated as needed. Shoes and walking aids Wear non-skid shoes. Wear shoes that have rubber soles and low heels. Do not wear high heels. Do not walk around the house in socks or slippers. Use a cane or walker as told by your provider. Home safety Attach secure railings on both sides of your stairs. Install grab bars for your bathtub, shower, and toilet. Use a non-skid mat in your bathtub or shower. Attach bath mats securely with double-sided, non-slip rug tape. Use good lighting in all rooms. Keep a flashlight near your bed. Make sure there is a clear path from your bed to the bathroom. Use night-lights. Do not use throw rugs. Make sure all carpeting is taped or tacked down securely. Remove all clutter from walkways and stairways, including extension cords. Repair uneven or broken steps and floors. Avoid walking on icy or slippery surfaces. Walk on the grass instead of on icy or slick sidewalks. Use ice melter to get rid of ice on walkways in the winter. Use a cordless phone. Questions to ask your health care provider Can you help me check my risk for a fall? Do any of my medicines make me more likely to  fall? Should I take a vitamin D  supplement? What exercises can I do to improve my strength and balance? Should I make an appointment to have my vision checked? Do I need a bone density test to check for weak bones (osteoporosis)? Would it help to use a cane or a walker? Where to find more information Centers for Disease Control and Prevention, STEAD[employer]: tonerpromos.no Community-Based Fall Prevention Programs: tonerpromos.no [employer] on Aging: baseringtones.pl Contact a health care provider if: You fall at home. You are afraid of falling at home. You feel weak, drowsy, or dizzy. This information is not intended to replace advice given to you by your health care provider. Make sure you discuss any questions you have with your health care provider. Document Revised: 05/15/2022 Document Reviewed: 05/15/2022 Elsevier Patient Education  2024 [employer]. Managing Pain Without Opioids Opioids are strong medicines used to treat moderate to severe pain. For some people, especially those who have  long-term (chronic) pain, opioids may not be the best choice for pain management due to: Side effects like nausea, constipation, and sleepiness. The risk of addiction (opioid use disorder). The longer you take opioids, the greater your risk of addiction. Pain that lasts for more than 3 months is called chronic pain. Managing chronic pain usually requires more than one approach and is often provided by a team of health care providers working together (multidisciplinary approach). Pain management may be done at a pain management center or pain clinic. How to manage pain without the use of opioids Use non-opioid medicines Non-opioid medicines for pain may include: Over-the-counter or prescription non-steroidal anti-inflammatory drugs (NSAIDs). These may be the first medicines used for pain. They work well for muscle and bone pain, and they reduce swelling. Acetaminophen . This over-the-counter medicine may work well for  milder pain but not swelling. Antidepressants. These may be used to treat chronic pain. A certain type of antidepressant (tricyclics) is often used. These medicines are given in lower doses for pain than when used for depression. Anticonvulsants. These are usually used to treat seizures but may also reduce nerve (neuropathic) pain. Muscle relaxants. These relieve pain caused by sudden muscle tightening (spasms). You may also use a pain medicine that is applied to the skin as a patch, cream, or gel (topical analgesic), such as a numbing medicine. These may cause fewer side effects than medicines taken by mouth. Do certain therapies as directed Some therapies can help with pain management. They include: Physical therapy. You will do exercises to gain strength and flexibility. A physical therapist may teach you exercises to move and stretch parts of your body that are weak, stiff, or painful. You can learn these exercises at physical therapy visits and practice them at home. Physical therapy may also involve: Massage. Heat wraps or applying heat or cold to affected areas. Electrical signals that interrupt pain signals (transcutaneous electrical nerve stimulation, TENS). Weak lasers that reduce pain and swelling (low-level laser therapy). Signals from your body that help you learn to regulate pain (biofeedback). Occupational therapy. This helps you to learn ways to function at home and work with less pain. Recreational therapy. This involves trying new activities or hobbies, such as a physical activity or drawing. Mental health therapy, including: Cognitive behavioral therapy (CBT). This helps you learn coping skills for dealing with pain. Acceptance and commitment therapy (ACT) to change the way you think and react to pain. Relaxation therapies, including muscle relaxation exercises and mindfulness-based stress reduction. Pain management counseling. This may be individual, family, or group  counseling.  Receive medical treatments Medical treatments for pain management include: Nerve block injections. These may include a pain blocker and anti-inflammatory medicines. You may have injections: Near the spine to relieve chronic back or neck pain. Into joints to relieve back or joint pain. Into nerve areas that supply a painful area to relieve body pain. Into muscles (trigger point injections) to relieve some painful muscle conditions. A medical device placed near your spine to help block pain signals and relieve nerve pain or chronic back pain (spinal cord stimulation device). Acupuncture. Follow these instructions at home Medicines Take over-the-counter and prescription medicines only as told by your health care provider. If you are taking pain medicine, ask your health care providers about possible side effects to watch out for. Do not drive or use heavy machinery while taking prescription opioid pain medicine. Lifestyle  Do not use drugs or alcohol to reduce pain. If you drink alcohol, limit  how much you have to: 0-1 drink a day for women who are not pregnant. 0-2 drinks a day for men. Know how much alcohol is in a drink. In the [place] one drink equals one 12 oz bottle of beer (355 mL), one 5 oz glass of wine (148 mL), or one 1 oz glass of hard liquor (44 mL). Do not use any products that contain nicotine or tobacco. These products include cigarettes, chewing tobacco, and vaping devices, such as e-cigarettes. If you need help quitting, ask your health care provider. Eat a healthy diet and maintain a healthy weight. Poor diet and excess weight may make pain worse. Eat foods that are high in fiber. These include fresh fruits and vegetables, whole grains, and beans. Limit foods that are high in fat and processed sugars, such as fried and sweet foods. Exercise regularly. Exercise lowers stress and may help relieve pain. Ask your health care provider what activities and exercises  are safe for you. If your health care provider approves, join an exercise class that combines movement and stress reduction. Examples include yoga and tai chi. Get enough sleep. Lack of sleep may make pain worse. Lower stress as much as possible. Practice stress reduction techniques as told by your therapist. General instructions Work with all your pain management providers to find the treatments that work best for you. You are an important member of your pain management team. There are many things you can do to reduce pain on your own. Consider joining an online or in-person support group for people who have chronic pain. Keep all follow-up visits. This is important. Where to find more information You can find more information about managing pain without opioids from: American Academy of Pain Medicine: painmed.org Institute for Chronic Pain: instituteforchronicpain.org American Chronic Pain Association: theacpa.org Contact a health care provider if: You have side effects from pain medicine. Your pain gets worse or does not get better with treatments or home therapy. You are struggling with anxiety or depression. Summary Many types of pain can be managed without opioids. Chronic pain may respond better to pain management without opioids. Pain is best managed when you and a team of health care providers work together. Pain management without opioids may include non-opioid medicines, medical treatments, physical therapy, mental health therapy, and lifestyle changes. Tell your health care providers if your pain gets worse or is not being managed well enough. This information is not intended to replace advice given to you by your health care provider. Make sure you discuss any questions you have with your health care provider. Document Revised: 12/22/2020 Document Reviewed: 12/22/2020 Elsevier Patient Education  2024 [employer].

## 2023-10-02 NOTE — Progress Notes (Signed)
 Interactive audio and video telecommunications were attempted between this provider and patient, however failed, due to patient having technical difficulties OR patient did not have access to video capability.  We continued and completed visit with audio only.  Because this visit was a virtual/telehealth visit,  certain criteria was not obtained, such a blood pressure, CBG if applicable, and timed get up and go. Any medications not marked as taking were not mentioned during the medication reconciliation part of the visit. Any vitals not documented were not able to be obtained due to this being a telehealth visit or patient was unable to self-report a recent blood pressure reading due to a lack of equipment at home via telehealth. Vitals that have been documented are verbally provided by the patient.   Subjective:   Shawn Lopez is a 83 y.o. male who presents for Medicare Annual/Subsequent preventive examination.  Visit Complete: Virtual I connected with  Shawn Lopez on 10/02/23 by a audio enabled telemedicine application and verified that I am speaking with the correct person using two identifiers.  Patient Location: Home  Provider Location: Home Office  I discussed the limitations of evaluation and management by telemedicine. The patient expressed understanding and agreed to proceed.  Vital Signs: Because this visit was a virtual/telehealth visit, some criteria may be missing or patient reported. Any vitals not documented were not able to be obtained and vitals that have been documented are patient reported.  Patient Medicare AWV questionnaire was completed by the patient on na; I have confirmed that all information answered by patient is correct and no changes since this date.  Cardiac Risk Factors include: advanced age (>10men, >58 women);dyslipidemia;hypertension;diabetes mellitus     Objective:    Today's Vitals   10/02/23 1451  Weight: 186 lb (84.4 kg)  Height: 5'  10 (1.778 m)   Body mass index is 26.69 kg/m.     10/02/2023    2:56 PM 05/10/2023    3:27 PM 04/20/2023    7:06 AM 04/17/2023    9:27 AM 12/06/2022    7:19 AM 11/24/2022    4:00 AM 11/23/2022    4:41 PM  Advanced Directives  Does Patient Have a Medical Advance Directive? No No No No No No No  Would patient like information on creating a medical advance directive? No - Patient declined No - Patient declined No - Patient declined No - Patient declined No - Patient declined No - Patient declined     Current Medications (verified) Outpatient Encounter Medications as of 10/02/2023  Medication Sig   aspirin  EC 81 MG tablet Take 1 tablet (81 mg total) by mouth daily with breakfast.   atorvastatin  (LIPITOR ) 80 MG tablet Take 1 tablet (80 mg total) by mouth every evening.   clopidogrel  (PLAVIX ) 75 MG tablet TAKE 1 TABLET BY MOUTH EACH DAY FOR BLOOD THINNER   empagliflozin  (JARDIANCE ) 10 MG TABS tablet Take 1 tablet (10 mg total) by mouth daily before breakfast.   HYDROcodone -acetaminophen  (NORCO) 10-325 MG tablet Take 1 tablet by mouth every 8 (eight) hours as needed for moderate pain or severe pain.   lisinopril  (ZESTRIL ) 10 MG tablet Take 1 tablet (10 mg total) by mouth daily.   nitroGLYCERIN  (NITROSTAT ) 0.4 MG SL tablet Place 1 tablet (0.4 mg total) under the tongue every 5 (five) minutes as needed for chest pain. For 3 doses only   pantoprazole  (PROTONIX ) 40 MG tablet Take 1 tablet (40 mg total) by mouth daily.   No facility-administered encounter  medications on file as of 10/02/2023.    Allergies (verified) Brilinta  [ticagrelor ], Amlodipine , and Other   History: Past Medical History:  Diagnosis Date   Arthritis    Atrial fibrillation (HCC) 2006   a. in 2006; not on anticoagulation, no recent atrial fib seen as of 2018.   Borderline hypertension    CAD (coronary artery disease)    a. NSTEMI 12/2016: cath showing 95% mid-LAD stenosis, 75% prox-LAD stenosis and 75% 1st Diag stenosis. DES  to the proximal and mid-LAD along with the 1st Diagonal..   Chronic back pain    Prior trauma with vertebral fracture   Hyperlipidemia    Hypertension    Osteopenia    Sinus bradycardia    Type 2 diabetes mellitus [facility])    Past Surgical History:  Procedure Laterality Date   APPENDECTOMY  1960   BIOPSY  04/20/2023   Procedure: BIOPSY;  Surgeon: Cinderella Deatrice FALCON, MD;  Location: [facility];  Service: Endoscopy;;   CATARACT EXTRACTION  2012   right eye,lens implantation   CHOLECYSTECTOMY     CIRCUMCISION  2010   meatotomy and dilatation   COLONOSCOPY N/A 01/14/2013   Procedure: COLONOSCOPY;  Surgeon: Oneil DELENA Budge, MD;  Location: [facility];  Service: Gastroenterology;  Laterality: N/A;   COLONOSCOPY WITH PROPOFOL  N/A 04/20/2023   Procedure: COLONOSCOPY WITH PROPOFOL ;  Surgeon: Cinderella Deatrice FALCON, MD;  Location: [facility];  Service: Endoscopy;  Laterality: N/A;  7:30am;asa 3   CORONARY ANGIOGRAPHY N/A 01/15/2017   Procedure: Coronary Angiography;  Surgeon: Dorn JINNY Lesches, MD;  Location: [facility] INVASIVE CV LAB;  Service: Cardiovascular;  Laterality: N/A;   CORONARY PRESSURE/FFR STUDY N/A 04/04/2019   Procedure: INTRAVASCULAR PRESSURE WIRE/FFR STUDY;  Surgeon: Mady Bruckner, MD;  Location: [facility] INVASIVE CV LAB;  Service: Cardiovascular;  Laterality: N/A;   CORONARY STENT INTERVENTION N/A 01/15/2017   Procedure: Coronary Stent Intervention;  Surgeon: Dorn JINNY Lesches, MD;  Location: [facility] INVASIVE CV LAB;  Service: Cardiovascular;  Laterality: N/A;   ESOPHAGOGASTRODUODENOSCOPY (EGD) WITH PROPOFOL  N/A 04/20/2023   Procedure: ESOPHAGOGASTRODUODENOSCOPY (EGD) WITH PROPOFOL ;  Surgeon: Cinderella Deatrice FALCON, MD;  [facility];  Service: Endoscopy;  Laterality: N/A;  7:30am;asa 3   LEFT HEART CATH AND CORONARY ANGIOGRAPHY N/A 04/04/2019   Procedure: LEFT HEART CATH AND CORONARY ANGIOGRAPHY;  Surgeon: Mady Bruckner, MD;  Location: [facility] INVASIVE CV LAB;  Service: Cardiovascular;  Laterality:  N/A;   POLYPECTOMY  04/20/2023   Procedure: POLYPECTOMY;  Surgeon: Cinderella Deatrice FALCON, MD;  [facility];  Service: Endoscopy;;   Family History  Problem Relation Age of Onset   CAD Father    Heart attack Brother    Social History   Socioeconomic History   Marital status: Married    Spouse name: Not on file   Number of children: 4   Years of education: Not on file   Highest education level: Not on file  Occupational History   Occupation: Holiday Representative    Comment: Ecologist  Tobacco Use   Smoking status: Former    Current packs/day: 0.00    Average packs/day: 1.5 packs/day for 15.0 years (22.5 ttl pk-yrs)    Types: Cigarettes    Start date: 04/20/1982    Quit date: 04/20/1997    Years since quitting: 26.4    Passive exposure: Past   Smokeless tobacco: Never  Vaping Use   Vaping status: Never Used  Substance and Sexual Activity   Alcohol use: No  Drug use: No   Sexual activity: Not on file  Other Topics Concern   Not on file  Social History Narrative   Not on file   Social Drivers of Health   Financial Resource Strain: Low Risk  (10/02/2023)   Overall Financial Resource Strain (CARDIA)    Difficulty of Paying Living Expenses: Not hard at all  Food Insecurity: No Food Insecurity (10/02/2023)   Hunger Vital Sign    Worried About Running Out of Food in the Last Year: Never true    Ran Out of Food in the Last Year: Never true  Transportation Needs: No Transportation Needs (10/02/2023)   PRAPARE - Administrator, Civil Service (Medical): No    Lack of Transportation (Non-Medical): No  Physical Activity: Sufficiently Active (10/02/2023)   Exercise Vital Sign    Days of Exercise per Week: 7 days    Minutes of Exercise per Session: 30 min  Stress: No Stress Concern Present (10/02/2023)   [employer] of Occupational Health - Occupational Stress Questionnaire    Feeling of Stress : Not at all  Social Connections: Moderately Integrated  (10/02/2023)   Social Connection and Isolation Panel [NHANES]    Frequency of Communication with Friends and Family: More than three times a week    Frequency of Social Gatherings with Friends and Family: More than three times a week    Attends Religious Services: More than 4 times per year    Active Member of [employer] or Organizations: No    Attends Engineer, Structural: Never    Marital Status: Married    Tobacco Counseling Counseling given: Yes   Clinical Intake:  Pre-visit preparation completed: Yes  Pain : No/denies pain     BMI - recorded: 26.69 Nutritional Status: BMI 25 -29 Overweight Nutritional Risks: None Diabetes: Yes CBG done?: No Did pt. bring in CBG monitor from home?: No  How often do you need to have someone help you when you read instructions, pamphlets, or other written materials from your doctor or pharmacy?: 1 - Never  Interpreter Needed?: No  Information entered by :: Ellan Tess, CMA   Activities of Daily Living    10/02/2023    2:53 PM 05/10/2023   11:00 PM  In your present state of health, do you have any difficulty performing the following activities:  Hearing? 0 1  Vision? 0 0  Difficulty concentrating or making decisions? 0 0  Walking or climbing stairs? 0 0  Dressing or bathing? 0 0  Doing errands, shopping? 0 0  Preparing Food and eating ? N   Using the Toilet? N   In the past six months, have you accidently leaked urine? N   Do you have problems with loss of bowel control? N   Managing your Medications? N   Managing your Finances? N   Housekeeping or managing your Housekeeping? N     Patient Care Team: Tobie Suzzane POUR, MD as PCP - General (Internal Medicine) Debera Jayson MATSU, MD as PCP - Cardiology (Cardiology)  Indicate any recent Medical Services you may have received from other than [facility] providers in the past year (date may be approximate).     Assessment:   This is a routine wellness examination for  [place].  Hearing/Vision screen Hearing Screening - Comments:: Patient denies any hearing difficulties.   Vision Screening - Comments:: Referral placed today for patient to establish care.     Goals Addressed  This Visit's Progress    Patient Stated       Stay healthy and active       Depression Screen    10/02/2023    2:58 PM 07/13/2023   10:39 AM 05/22/2023   10:35 AM  PHQ 2/9 Scores  PHQ - 2 Score 0 0 0  PHQ- 9 Score 0 0     Fall Risk    10/02/2023    2:56 PM 07/13/2023   10:39 AM 05/22/2023   10:35 AM  Fall Risk   Falls in the past year? 0 0 0  Number falls in past yr: 0 0 0  Injury with Fall? 0 0 0  Risk for fall due to : No Fall Risks No Fall Risks   Follow up Falls prevention discussed Falls evaluation completed     MEDICARE RISK AT HOME: Medicare Risk at Home Any stairs in or around the home?: Yes If so, are there any without handrails?: No Home free of loose throw rugs in walkways, pet beds, electrical cords, etc?: Yes Adequate lighting in your home to reduce risk of falls?: Yes Life alert?: No Use of a cane, walker or w/c?: No Grab bars in the bathroom?: Yes Shower chair or bench in shower?: Yes Elevated toilet seat or a handicapped toilet?: No  TIMED UP AND GO:  Was the test performed?  No    Cognitive Function:        10/02/2023    2:57 PM  6CIT Screen  What Year? 0 points  What month? 0 points  What time? 0 points  Count back from 20 0 points  Months in reverse 0 points  Repeat phrase 0 points  Total Score 0 points    Immunizations Immunization History  Administered Date(s) Administered   Fluad Trivalent(High Dose 65+) 05/22/2023   PNEUMOCOCCAL CONJUGATE-20 07/13/2023    TDAP status: Due, Education has been provided regarding the importance of this vaccine. Advised may receive this vaccine at local pharmacy or Health Dept. Aware to provide a copy of the vaccination record if obtained from local pharmacy or Health  Dept. Verbalized acceptance and understanding.  Flu Vaccine status: Up to date  Pneumococcal vaccine status: Up to date  Covid-19 vaccine status: Information provided on how to obtain vaccines.   Qualifies for Shingles Vaccine? Yes   Zostavax completed No   Shingrix Completed?: No.    Education has been provided regarding the importance of this vaccine. Patient has been advised to call insurance company to determine out of pocket expense if they have not yet received this vaccine. Advised may also receive vaccine at local pharmacy or Health Dept. Verbalized acceptance and understanding.  Screening Tests Health Maintenance  Topic Date Due   Medicare Annual Wellness (AWV)  Never done   OPHTHALMOLOGY EXAM  Never done   DTaP/Tdap/Td (1 - Tdap) Never done   Zoster Vaccines- Shingrix (1 of 2) Never done   COVID-19 Vaccine (1 - 2024-25 season) Never done   HEMOGLOBIN A1C  12/20/2023   Colonoscopy  04/19/2024   Diabetic kidney evaluation - eGFR measurement  06/21/2024   Diabetic kidney evaluation - Urine ACR  06/21/2024   FOOT EXAM  07/12/2024   Pneumonia Vaccine 15+ Years old  Completed   INFLUENZA VACCINE  Completed   HPV VACCINES  Aged Out    Health Maintenance  Health Maintenance Due  Topic Date Due   Medicare Annual Wellness (AWV)  Never done   OPHTHALMOLOGY EXAM  Never done  DTaP/Tdap/Td (1 - Tdap) Never done   Zoster Vaccines- Shingrix (1 of 2) Never done   COVID-19 Vaccine (1 - 2024-25 season) Never done    Colorectal cancer screening: Type of screening: Colonoscopy. Completed 04/20/2023. Repeat every 1 years  Lung Cancer Screening: (Low Dose CT Chest recommended if Age 38-80 years, 20 pack-year currently smoking OR have quit w/in 15years.) does not qualify.   Lung Cancer Screening Referral: na  Additional Screening:  Hepatitis C Screening: does not qualify  Vision Screening: Recommended annual ophthalmology exams for early detection of glaucoma and other disorders  of the eye. Is the patient up to date with their annual eye exam?  No  Who is the provider or what is the name of the office in which the patient attends annual eye exams? Referral placed 10/02/2023  Dental Screening: Recommended annual dental exams for proper oral hygiene  Diabetic Foot Exam: Diabetic Foot Exam: Completed 07/13/2023  Community Resource Referral / Chronic Care Management: CRR required this visit?  No   CCM required this visit?  No     Plan:     I have personally reviewed and noted the following in the patient's chart:   Medical and social history Use of alcohol, tobacco or illicit drugs  Current medications and supplements including opioid prescriptions. Patient is currently taking opioid prescriptions. Information provided to patient regarding non-opioid alternatives. Patient advised to discuss non-opioid treatment plan with their provider. Functional ability and status Nutritional status Physical activity Advanced directives List of other physicians Hospitalizations, surgeries, and ER visits in previous 12 months Vitals Screenings to include cognitive, depression, and falls Referrals and appointments  In addition, I have reviewed and discussed with patient certain preventive protocols, quality metrics, and best practice recommendations. A written personalized care plan for preventive services as well as general preventive health recommendations were provided to patient.     [facility] Ramil Edgington, CMA   10/02/2023   After Visit Summary: (Mail) Due to this being a telephonic visit, the after visit summary with patients personalized plan was offered to patient via mail

## 2023-10-02 NOTE — Addendum Note (Signed)
 Addended by: Recardo Evangelist A on: 10/02/2023 03:47 PM   Modules accepted: Level of Service

## 2023-11-05 ENCOUNTER — Ambulatory Visit (INDEPENDENT_AMBULATORY_CARE_PROVIDER_SITE_OTHER): Payer: Medicare HMO | Admitting: Gastroenterology

## 2023-11-05 ENCOUNTER — Encounter (INDEPENDENT_AMBULATORY_CARE_PROVIDER_SITE_OTHER): Payer: Self-pay | Admitting: Gastroenterology

## 2023-11-05 VITALS — BP 138/71 | HR 52 | Temp 97.8°F | Ht 70.0 in | Wt 183.7 lb

## 2023-11-05 DIAGNOSIS — K219 Gastro-esophageal reflux disease without esophagitis: Secondary | ICD-10-CM | POA: Diagnosis not present

## 2023-11-05 DIAGNOSIS — Z8619 Personal history of other infectious and parasitic diseases: Secondary | ICD-10-CM

## 2023-11-05 DIAGNOSIS — A048 Other specified bacterial intestinal infections: Secondary | ICD-10-CM

## 2023-11-05 DIAGNOSIS — Z860100 Personal history of colon polyps, unspecified: Secondary | ICD-10-CM | POA: Insufficient documentation

## 2023-11-05 DIAGNOSIS — K5904 Chronic idiopathic constipation: Secondary | ICD-10-CM | POA: Insufficient documentation

## 2023-11-05 MED ORDER — ESOMEPRAZOLE MAGNESIUM 20 MG PO CPDR
20.0000 mg | DELAYED_RELEASE_CAPSULE | Freq: Two times a day (BID) | ORAL | 0 refills | Status: DC
Start: 2023-11-05 — End: 2023-11-16

## 2023-11-05 MED ORDER — ESOMEPRAZOLE MAGNESIUM 20 MG PO CPDR: 20.0000 mg | DELAYED_RELEASE_CAPSULE | Freq: Two times a day (BID) | ORAL | Status: DC

## 2023-11-05 NOTE — Patient Instructions (Signed)
 It was very nice to meet you today, as dicussed with will plan for the following :  1) Nexium  20mg , twice daily - advised to take 30 min before breakfast and dinner    GERD recommendations: 1) Avoid coffee, tea, cola beverages, carbonated beverages, spicy foods, greasy foods, foods high in acid content (e.g. tomatoes and citrus fruits), chocolate, and peppermint 2) Avoid drinking alcoholic beverages 3) Avoid smoking 4) Eat small meals and keep weight within normal range 5) Avoid recumbent posture for 3 hours post-prandially 6) Elevate head of bed

## 2023-11-05 NOTE — Progress Notes (Signed)
 Dealie Koelzer Faizan Trygg Mantz , M.D. Gastroenterology & Hepatology [facility] Gastroenterology [address] [place], [place] [address] Primary Care Physician: Meldon Sport, MD [address] [place] [address]  Chief Complaint:  constipation, GERD, H pylori and dysphagia   History of Present Illness: Shawn Lopez is a 83 y.o. male with past medical history of  HTN, HLD, DM, chronic pain, CAD s/p PCI 2018 on DAPT presenting today for follow up of constipation, GERD, H pylori and dysphagia   Patient was treated for H. pylori at clindamycin based triple therapy eradication confirmed with H. Pylori breath test  Patient reports since last upper endoscopy and treatment of H. pylori dysphagia has resolved.  Patient also reports previous complaint of constipation has also resolved as he is having 1 bowel movement daily without any difficulty  Patient reports weight loss is not a complete anymore as his weight is stable  His complaint today is reflux especially after dinner where he would be nauseous with occasional vomiting.  He intermittently takes pantoprazole  sometimes with food or after food  CT Chest 2024 IMPRESSION: 1. No acute pulmonary embolism. 2. Cardiomegaly with minimal interlobular septal thickening suggesting mild pulmonary edema.   CT Abdomen and pelvis 2022 1. No acute intra-abdominal process. 2. Aortic Atherosclerosis (ICD10-I70.0).   Last Colonoscopy:03/2023- Ten 2 to 9 mm polyps in the descending colon and                            in the transverse colon, removed with a cold snare.                            Resected and retrieved.                           - One 4 mm polyp in the transverse colon, removed                            with a jumbo cold forceps. Resected and retrieved.                            Clip (MR conditional) was placed. 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                            - Non-bleeding external internal hemorrhoids.                           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 (11 polyps, all TAs) repeat Colonoscopy 1 year  Last Endoscopy:03/2023- No endoscopic esophageal abnormality to explain                            patient's dysphagia.                           - Erythematous mucosa in the stomach. Biopsied.                           -  Normal duodenal bulb and second portion of the                            duodenum.                           - Z-line regular, 36 cm from the incisors.                           - Biopsies were taken with a cold forceps for                            evaluation of eosinophilic esophagitis.  Past Medical History: Past Medical History:  Diagnosis Date   Arthritis    Atrial fibrillation (HCC) 2006   a. in 2006; not on anticoagulation, no recent atrial fib seen as of 2018.   Borderline hypertension    CAD (coronary artery disease)    a. NSTEMI 12/2016: cath showing 95% mid-LAD stenosis, 75% prox-LAD stenosis and 75% 1st Diag stenosis. DES to the proximal and mid-LAD along with the 1st Diagonal..   Chronic back pain    Prior trauma with vertebral fracture   Hyperlipidemia    Hypertension    Osteopenia    Sinus bradycardia    Type 2 diabetes mellitus [facility])     Past Surgical History: Past Surgical History:  Procedure Laterality Date   APPENDECTOMY  1960   BIOPSY  04/20/2023   Procedure: BIOPSY;  Surgeon: Hargis Lias, MD;  [facility];  Service: Endoscopy;;   CATARACT EXTRACTION  2012   right eye,lens implantation   CHOLECYSTECTOMY     CIRCUMCISION  2010   meatotomy and dilatation   COLONOSCOPY N/A 01/14/2013   Procedure: COLONOSCOPY;  Surgeon: Beau Bound, MD;  [facility];  Service: Gastroenterology;  Laterality: N/A;   COLONOSCOPY WITH PROPOFOL  N/A 04/20/2023   Procedure: COLONOSCOPY WITH PROPOFOL ;  Surgeon: Hargis Lias, MD;  [facility];  Service:  Endoscopy;  Laterality: N/A;  7:30am;asa 3   CORONARY ANGIOGRAPHY N/A 01/15/2017   Procedure: Coronary Angiography;  Surgeon: Avanell Leigh, MD;  Location: [facility] INVASIVE CV LAB;  Service: Cardiovascular;  Laterality: N/A;   CORONARY PRESSURE/FFR STUDY N/A 04/04/2019   Procedure: INTRAVASCULAR PRESSURE WIRE/FFR STUDY;  Surgeon: Sammy Crisp, MD;  [facility]: [facility] INVASIVE CV LAB;  Service: Cardiovascular;  Laterality: N/A;   CORONARY STENT INTERVENTION N/A 01/15/2017   Procedure: Coronary Stent Intervention;  Surgeon: Avanell Leigh, MD;  Location: [facility] INVASIVE CV LAB;  Service: Cardiovascular;  Laterality: N/A;   ESOPHAGOGASTRODUODENOSCOPY (EGD) WITH PROPOFOL  N/A 04/20/2023   Procedure: ESOPHAGOGASTRODUODENOSCOPY (EGD) WITH PROPOFOL ;  Surgeon: Hargis Lias, MD;  [facility];  Service: Endoscopy;  Laterality: N/A;  7:30am;asa 3   LEFT HEART CATH AND CORONARY ANGIOGRAPHY N/A 04/04/2019   Procedure: LEFT HEART CATH AND CORONARY ANGIOGRAPHY;  Surgeon: Sammy Crisp, MD;  [facility]: [facility] INVAS[facility] CV LAB;  Service: Cardiovascular;  Laterality: N/A;   POLYPECTOMY  04/20/2023   Procedure: POLYPECTOMY;  Surgeon: Hargis Lias, MD;  [facility];  Service: Endoscopy;;    Family History: Family History  Problem Relation Age of Onset   CAD Father    Heart attack Brother     Social History: Social History   Tobacco Use  Smoking  Status Former   Current packs/day: 0.00   Average packs/day: 1.5 packs/day for 15.0 years (22.5 ttl pk-yrs)   Types: Cigarettes   Start date: 04/20/1982   Quit date: 04/20/1997   Years since quitting: 26.5   Passive exposure: Past  Smokeless Tobacco Never   Social History   Substance and Sexual Activity  Alcohol Use No   Social History   Substance and Sexual Activity  Drug Use No    Allergies: Allergies  Allergen Reactions   Brilinta  [Ticagrelor ]     Dyspnea    Amlodipine      Hypotension    Other Other (See Comments)    Pt  states antibiotics give him the shakes    Medications: Current Outpatient Medications  Medication Sig Dispense Refill   aspirin  EC 81 MG tablet Take 1 tablet (81 mg total) by mouth daily with breakfast. 90 tablet 4   atorvastatin  (LIPITOR ) 80 MG tablet Take 1 tablet (80 mg total) by mouth every evening. 90 tablet 3   clopidogrel  (PLAVIX ) 75 MG tablet TAKE 1 TABLET BY MOUTH EACH DAY FOR BLOOD THINNER 90 tablet 3   glimepiride  (AMARYL ) 2 MG tablet Take 2 mg by mouth every morning.     HYDROcodone -acetaminophen  (NORCO) 10-325 MG tablet Take 1 tablet by mouth every 8 (eight) hours as needed for moderate pain or severe pain.     lisinopril  (ZESTRIL ) 10 MG tablet Take 1 tablet (10 mg total) by mouth daily. 90 tablet 1   nitroGLYCERIN  (NITROSTAT ) 0.4 MG SL tablet Place 1 tablet (0.4 mg total) under the tongue every 5 (five) minutes as needed for chest pain. For 3 doses only 30 tablet 0   pantoprazole  (PROTONIX ) 40 MG tablet Take 1 tablet (40 mg total) by mouth daily. 90 tablet 3   empagliflozin  (JARDIANCE ) 10 MG TABS tablet Take 1 tablet (10 mg total) by mouth daily before breakfast. (Patient not taking: Reported on 11/05/2023) 30 tablet 3   No current facility-administered medications for this visit.    Review of Systems: GENERAL: negative for malaise, night sweats HEENT: No changes in hearing or vision, no nose bleeds or other nasal problems. NECK: Negative for lumps, goiter, pain and significant neck swelling RESPIRATORY: Negative for cough, wheezing CARDIOVASCULAR: Negative for chest pain, leg swelling, palpitations, orthopnea GI: SEE HPI MUSCULOSKELETAL: Negative for joint pain or swelling, back pain, and muscle pain. SKIN: Negative for lesions, rash HEMATOLOGY Negative for prolonged bleeding, bruising easily, and swollen nodes. ENDOCRINE: Negative for cold or heat intolerance, polyuria, polydipsia and goiter. NEURO: negative for tremor, gait imbalance, syncope and seizures. The remainder  of the review of systems is noncontributory.   Physical Exam: There were no vitals taken for this visit. GENERAL: The patient is AO x3, in no acute distress. HEENT: Head is normocephalic and atraumatic. EOMI are intact. Mouth is well hydrated and without lesions. NECK: Supple. No masses LUNGS: Clear to auscultation. No presence of rhonchi/wheezing/rales. Adequate chest expansion HEART: RRR, normal s1 and s2. ABDOMEN: Soft, nontender, no guarding, no peritoneal signs, and nondistended. BS +. No masses.  Imaging/Labs: as above     Latest Ref Rng & Units 06/22/2023    8:28 AM 05/10/2023    3:48 PM 03/12/2023   11:05 AM  CBC  WBC 3.4 - 10.8 x10E3/uL 7.4  9.2  7.9   Hemoglobin 13.0 - 17.7 g/dL 16.1  09.6  04.5   Hematocrit 37.5 - 51.0 % 46.5  43.4  45.6   Platelets 150 - 450 x10E3/uL  183  188  182    No results found for: "IRON", "TIBC", "FERRITIN"  I personally reviewed and interpreted the available labs, imaging and endoscopic files.  Impression and Plan: Shawn Lopez is a 83 y.o. male with past medical history of  HTN, HLD, DM, chronic pain, CAD s/p PCI 2018 on DAPT presenting today for follow up of constipation, GERD, H pylori and dysphagia   #GERD  #Constipation -improved #Dysphagia-Resolved #Weight loss-stable   Patient had upper endoscopy and colonoscopy and CT abdomen pelvis in July 2024  Upper endoscopy with H. pylori treated with clarithromycin -based triple therapy confirmed eradication with H. pylori breath test  Colonoscopy with 11 TA suggest repeat 1 year, July 2025  Complaint remains reflux with occasional nausea and vomiting.  Patient taking PPI incorrectly as he takes with food and after food and only intermittently  Given persistent symptoms will change PPI to increase potency, will add on Nexium  20 mg twice daily 30 minutes before breakfast and 30 minutes before meals  We will see patient back in June 2025 to assess clinical status before repeat  colonoscopy.  Will need cardiology clearance again at that  All questions were answered.      Maxim Bedel Faizan Marlissa Emerick, MD Gastroenterology and Hepatology [facility] Gastroenterology   This chart has been completed using [facility] Dictation software, and while attempts have been made to ensure accuracy , certain words and phrases may not be transcribed as intended

## 2023-11-16 ENCOUNTER — Ambulatory Visit (INDEPENDENT_AMBULATORY_CARE_PROVIDER_SITE_OTHER): Payer: Medicare HMO | Admitting: Internal Medicine

## 2023-11-16 ENCOUNTER — Encounter: Payer: Self-pay | Admitting: Internal Medicine

## 2023-11-16 VITALS — BP 122/62 | HR 57 | Ht 70.0 in | Wt 183.2 lb

## 2023-11-16 DIAGNOSIS — E1169 Type 2 diabetes mellitus with other specified complication: Secondary | ICD-10-CM | POA: Diagnosis not present

## 2023-11-16 DIAGNOSIS — K219 Gastro-esophageal reflux disease without esophagitis: Secondary | ICD-10-CM | POA: Diagnosis not present

## 2023-11-16 DIAGNOSIS — E785 Hyperlipidemia, unspecified: Secondary | ICD-10-CM | POA: Diagnosis not present

## 2023-11-16 DIAGNOSIS — I25118 Atherosclerotic heart disease of native coronary artery with other forms of angina pectoris: Secondary | ICD-10-CM

## 2023-11-16 DIAGNOSIS — Z7984 Long term (current) use of oral hypoglycemic drugs: Secondary | ICD-10-CM

## 2023-11-16 MED ORDER — ESOMEPRAZOLE MAGNESIUM 20 MG PO CPDR: 20.0000 mg | DELAYED_RELEASE_CAPSULE | Freq: Two times a day (BID) | ORAL | 5 refills | Status: AC

## 2023-11-16 MED ORDER — GLIMEPIRIDE 2 MG PO TABS: 2.0000 mg | ORAL_TABLET | Freq: Every morning | ORAL | 1 refills | Status: AC

## 2023-11-16 NOTE — Assessment & Plan Note (Addendum)
On Lipitor 80 mg daily Lipid profile reviewed

## 2023-11-16 NOTE — Assessment & Plan Note (Signed)
Well controlled now Continue Nexium 20 mg BID, refilled - recently switched by GI

## 2023-11-16 NOTE — Assessment & Plan Note (Signed)
S/p stent placement On aspirin, Plavix and statin Has easy bruising -had discussion with cardiology if he can stop aspirin or Plavix (last stent > 1 year ago), but due to his long segmented stent -recommend DAPT Has nitroglycerin as needed for chest pain Followed by cardiology

## 2023-11-16 NOTE — Patient Instructions (Signed)
Please try to take Nexium once daily once your acid reflux improves.  Please continue to take other medications as prescribed.  Please continue to follow low carb diet and perform moderate exercise/walking as tolerated.

## 2023-11-16 NOTE — Progress Notes (Signed)
Established Patient Office Visit  Subjective:  Patient ID: Shawn Lopez, male    DOB: March 26, 1941  Age: 83 y.o. MRN: [id number]  CC:  Chief Complaint  Patient presents with   Care Management    4 month f/u, was not able to stay on jardiance because it caused dizziness.    HPI Shawn Lopez is a 83 y.o. male with past medical history of CAD s/p stent placement, HTN, type II DM, HLD and chronic shoulder pain who presents for f/u of his chronic medical conditions.  CAD status post stent placement and HTN: He takes aspirin, Plavix and statin.  His blood pressure is wnl today.  He is taking lisinopril 10 mg once daily regularly now. He denies any headache, dizziness, chest pain, dyspnea or palpitations currently.   Type II DM: He was given Jardiance 10 mg QD, but had dizziness with it.  He started taking glimepiride 1 mg (half tablet of 2 mg) BID again.  His last HbA1c was 6.2.  Denies hypoglycemic episodes recently.  Denies any polyuria or polyphagia currently.  GERD: He is taking Nexium 20 mg BID currently, recently switched from pantoprazole.  He is responding better to Nexium now.  He recently had EGD in 07/24, which showed H. pylori infection.  He has completed  triple therapy for it.  Denies any nausea, vomiting, dysphagia or odynophagia currently.    Past Medical History:  Diagnosis Date   Arthritis    Atrial fibrillation (HCC) 2006   a. in 2006; not on anticoagulation, no recent atrial fib seen as of 2018.   Borderline hypertension    CAD (coronary artery disease)    a. NSTEMI 12/2016: cath showing 95% mid-LAD stenosis, 75% prox-LAD stenosis and 75% 1st Diag stenosis. DES to the proximal and mid-LAD along with the 1st Diagonal..   Chronic back pain    Prior trauma with vertebral fracture   Hyperlipidemia    Hypertension    Osteopenia    Sinus bradycardia    Type 2 diabetes mellitus [facility])     Past Surgical History:  Procedure Laterality Date   APPENDECTOMY   1960   BIOPSY  04/20/2023   Procedure: BIOPSY;  Surgeon: Franky Macho, MD;  [facility];  Service: Endoscopy;;   CATARACT EXTRACTION  2012   right eye,lens implantation   CHOLECYSTECTOMY     CIRCUMCISION  2010   meatotomy and dilatation   COLONOSCOPY N/A 01/14/2013   Procedure: COLONOSCOPY;  Surgeon: Dalia Heading, MD;  [facility];  Service: Gastroenterology;  Laterality: N/A;   COLONOSCOPY WITH PROPOFOL N/A 04/20/2023   Procedure: COLONOSCOPY WITH PROPOFOL;  Surgeon: Franky Macho, MD;  [facility];  Service: Endoscopy;  Laterality: N/A;  7:30am;asa 3   CORONARY ANGIOGRAPHY N/A 01/15/2017   Procedure: Coronary Angiography;  Surgeon: Runell Gess, MD;  Location: [facility] INVASIVE CV LAB;  Service: Cardiovascular;  Laterality: N/A;   CORONARY PRESSURE/FFR STUDY N/A 04/04/2019   Procedure: INTRAVASCULAR PRESSURE WIRE/FFR STUDY;  Surgeon: Yvonne Kendall, MD;  [facility] INVAS[facility] CV LAB;  Service: Cardiovascular;  Laterality: N/A;   CORONARY STENT INTERVENTION N/A 01/15/2017   Procedure: Coronary Stent Intervention;  Surgeon: Runell Gess, MD;  [facility]: [facility] INVASIVE CV LAB;  Service: Cardiovascular;  Laterality: N/A;   ESOPHAGOGASTRODUODENOSCOPY (EGD) WITH PROPOFOL N/A 04/20/2023   Procedure: ESOPHAGOGASTRODUODENOSCOPY (EGD) WITH PROPOFOL;  Surgeon: Franky Macho, MD;  [facility];  Service: Endoscopy;  Laterality: N/A;  7:30am;asa 3   LEFT HEART CATH AND CORONARY ANGIOGRAPHY N/A 04/04/2019   Procedure: LEFT HEART CATH AND CORONARY ANGIOGRAPHY;  Surgeon: Yvonne Kendall, MD;  [facility] CV LAB;  Service: Cardiovascular;  Laterality: N/A;   POLYPECTOMY  04/20/2023   Procedure: POLYPECTOMY;  Surgeon: Franky Macho, MD;  [facility];  Service: Endoscopy;;    Family History  Problem Relation Age of Onset   CAD Father    Heart attack Brother     Social History   Socioeconomic History   Marital status:  Married    Spouse name: Not on file   Number of children: 4   Years of education: Not on file   Highest education level: Not on file  Occupational History   Occupation: Holiday representative    Comment: [employer] fast stream[employer]  Tobacco Use   Smoking status: Former    Current packs/day: 0.00    Average packs/day: 1.5 packs/day for 15.0 years (22.5 ttl pk-yrs)    Types: Cigarettes    Start date: 04/20/1982    Quit date: 04/20/1997    Years since quitting: 26.5    Passive exposure: Past   Smokeless tobacco: Never  Vaping Use   Vaping status: Never Used  Substance and Sexual Activity   Alcohol use: No   Drug use: No   Sexual activity: Not on file  Other Topics Concern   Not on file  Social History Narrative   Not on file   Social Drivers of Health   Financial Resource Strain: Low Risk  (10/02/2023)   Overall Financial Resource Strain (CARDIA)    Difficulty of Paying Living Expenses: Not hard at all  Food Insecurity: No Food Insecurity (10/02/2023)   Hunger Vital Sign    Worried About Running Out of Food in the Last Year: Never true    Ran Out of Food in the Last Year: Never true  Transportation Needs: No Transportation Needs (10/02/2023)   PRAPARE - Administrator, Civil Service (Medical): No    Lack of Transportation (Non-Medical): No  Physical Activity: Sufficiently Active (10/02/2023)   Exercise Vital Sign    Days of Exercise per Week: 7 days    Minutes of Exercise per Session: 30 min  Stress: No Stress Concern Present (10/02/2023)   [employer] of Occupational Health - Occupational Stress Questionnaire    Feeling of Stress : Not at all  Social Connections: Moderately Integrated (10/02/2023)   Social Connection and Isolation Panel [NHANES]    Frequency of Communication with Friends and Family: More than three times a week    Frequency of Social Gatherings with Friends and Family: More than three times a week    Attends Religious Services: More than 4 times per year     Active Member of [employer] or Organizations: No    Attends Banker Meetings: Never    Marital Status: Married  Catering manager Violence: Not At Risk (10/02/2023)   Humiliation, Afraid, Rape, and Kick questionnaire    Fear of Current or Ex-Partner: No    Emotionally Abused: No    Physically Abused: No    Sexually Abused: No    Outpatient Medications Prior to Visit  Medication Sig Dispense Refill   aspirin EC 81 MG tablet Take 1 tablet (81 mg total) by mouth daily with breakfast. 90 tablet 4   atorvastatin (LIPITOR) 80 MG tablet Take 1 tablet (80 mg total) by mouth every evening. 90 tablet 3   clopidogrel (PLAVIX)  75 MG tablet TAKE 1 TABLET BY MOUTH EACH DAY FOR BLOOD THINNER 90 tablet 3   HYDROcodone-acetaminophen (NORCO) 10-325 MG tablet Take 1 tablet by mouth every 8 (eight) hours as needed for moderate pain or severe pain.     lisinopril (ZESTRIL) 10 MG tablet Take 1 tablet (10 mg total) by mouth daily. 90 tablet 1   nitroGLYCERIN (NITROSTAT) 0.4 MG SL tablet Place 1 tablet (0.4 mg total) under the tongue every 5 (five) minutes as needed for chest pain. For 3 doses only 30 tablet 0   esomeprazole (NEXIUM) 20 MG capsule Take 1 capsule (20 mg total) by mouth 2 (two) times daily before a meal. 60 capsule 0   glimepiride (AMARYL) 2 MG tablet Take 2 mg by mouth every morning.     empagliflozin (JARDIANCE) 10 MG TABS tablet Take 1 tablet (10 mg total) by mouth daily before breakfast. (Patient not taking: Reported on 11/16/2023) 30 tablet 3   No facility-administered medications prior to visit.    Allergies  Allergen Reactions   Brilinta [Ticagrelor]     Dyspnea    Amlodipine     Hypotension    Other Other (See Comments)    Pt states antibiotics give him the shakes    ROS Review of Systems  Constitutional:  Negative for chills and fever.  HENT:  Negative for congestion and sore throat.   Eyes:  Negative for pain and discharge.  Respiratory:  Negative for cough and  shortness of breath.   Cardiovascular:  Negative for chest pain and palpitations.  Gastrointestinal:  Negative for constipation, diarrhea, nausea and vomiting.  Endocrine: Negative for polydipsia and polyuria.  Genitourinary:  Negative for dysuria and hematuria.  Musculoskeletal:  Positive for arthralgias (R shoulder). Negative for neck pain and neck stiffness.  Skin:  Negative for rash.  Neurological:  Negative for dizziness, weakness, numbness and headaches.  Psychiatric/Behavioral:  Negative for agitation and behavioral problems.       Objective:    Physical Exam Vitals reviewed.  Constitutional:      General: He is not in acute distress.    Appearance: He is not diaphoretic.  HENT:     Head: Normocephalic and atraumatic.     Mouth/Throat:     Mouth: Mucous membranes are moist.  Eyes:     General: No scleral icterus.    Extraocular Movements: Extraocular movements intact.  Cardiovascular:     Rate and Rhythm: Normal rate and regular rhythm.     Heart sounds: Normal heart sounds. No murmur heard. Pulmonary:     Breath sounds: Normal breath sounds. No wheezing or rales.  Musculoskeletal:     Cervical back: Neck supple. No tenderness.     Right lower leg: No edema.     Left lower leg: No edema.  Skin:    General: Skin is warm.     Findings: No rash.  Neurological:     General: No focal deficit present.     Mental Status: He is alert and oriented to person, place, and time.     Sensory: No sensory deficit.     Motor: No weakness.  Psychiatric:        Mood and Affect: Mood normal.        Behavior: Behavior normal.     BP 122/62 (BP Location: Left Arm)   Pulse (!) 57   Ht 5\' 10"  (1.778 m)   Wt 183 lb 3.2 oz (83.1 kg)   SpO2 96%   BMI  26.29 kg/m  Wt Readings from Last 3 Encounters:  11/16/23 183 lb 3.2 oz (83.1 kg)  11/05/23 183 lb 11.2 oz (83.3 kg)  10/02/23 186 lb (84.4 kg)    Lab Results  Component Value Date   TSH 1.724 06/11/2019   Lab Results   Component Value Date   WBC 7.4 06/22/2023   HGB 14.8 06/22/2023   HCT 46.5 06/22/2023   MCV 95 06/22/2023   PLT 183 06/22/2023   Lab Results  Component Value Date   NA 142 06/22/2023   K 4.9 06/22/2023   CO2 23 06/22/2023   GLUCOSE 125 (H) 06/22/2023   BUN 17 06/22/2023   CREATININE 0.80 06/22/2023   BILITOT 0.6 06/22/2023   ALKPHOS 123 (H) 06/22/2023   AST 23 06/22/2023   ALT 26 06/22/2023   PROT 6.5 06/22/2023   ALBUMIN 4.2 06/22/2023   CALCIUM 9.6 06/22/2023   ANIONGAP 10 05/10/2023   EGFR 89 06/22/2023   Lab Results  Component Value Date   CHOL 116 06/22/2023   Lab Results  Component Value Date   HDL 38 (L) 06/22/2023   Lab Results  Component Value Date   LDLCALC 59 06/22/2023   Lab Results  Component Value Date   TRIG 102 06/22/2023   Lab Results  Component Value Date   CHOLHDL 3.1 06/22/2023   Lab Results  Component Value Date   HGBA1C 6.2 (H) 06/22/2023      Assessment & Plan:   Problem List Items Addressed This Visit       Cardiovascular and Mediastinum   CAD (coronary artery disease) with PCI 12/2016   S/p stent placement On aspirin, Plavix and statin Has easy bruising -had discussion with cardiology if he can stop aspirin or Plavix (last stent > 1 year ago), but due to his long segmented stent -recommend DAPT Has nitroglycerin as needed for chest pain Followed by cardiology        Digestive   Gastroesophageal reflux disease without esophagitis   Well controlled now Continue Nexium 20 mg BID, refilled - recently switched by GI       Relevant Medications   esomeprazole (NEXIUM) 20 MG capsule     Endocrine   Type 2 diabetes mellitus with other specified complication (HCC) - Primary   Lab Results  Component Value Date   HGBA1C 6.2 (H) 06/22/2023   Overall well controlled for his age Associated with HTN, CAD and HLD On glimepiride 1 mg twice daily again, considering his age and tightly controlled glycemic profile, would avoid  Sus, but he has tolerated it better - DC Jardiance, sent Glimepiride Rx Did not tolerate metformin Had dizziness with Jardiance Advised to follow diabetic diet On statin and ACEi F/u CMP and lipid panel Diabetic eye exam: Advised to follow up with Ophthalmology for diabetic eye exam      Relevant Medications   glimepiride (AMARYL) 2 MG tablet   Other Relevant Orders   CMP14+EGFR   Hemoglobin A1c     Other   Hyperlipidemia LDL goal <70   On Lipitor 80 mg daily Lipid profile reviewed        Meds ordered this encounter  Medications   glimepiride (AMARYL) 2 MG tablet    Sig: Take 1 tablet (2 mg total) by mouth every morning.    Dispense:  90 tablet    Refill:  1   esomeprazole (NEXIUM) 20 MG capsule    Sig: Take 1 capsule (20 mg total) by mouth 2 (two)  times daily before a meal.    Dispense:  60 capsule    Refill:  5    Follow-up: Return in about 6 months (around 05/15/2024) for Annual physical.    Anabel Halon, MD

## 2023-11-16 NOTE — Assessment & Plan Note (Addendum)
Lab Results  Component Value Date   HGBA1C 6.2 (H) 06/22/2023   Overall well controlled for his age Associated with HTN, CAD and HLD On glimepiride 1 mg twice daily again, considering his age and tightly controlled glycemic profile, would avoid Sus, but he has tolerated it better - DC Jardiance, sent Glimepiride Rx Did not tolerate metformin Had dizziness with Jardiance Advised to follow diabetic diet On statin and ACEi F/u CMP and lipid panel Diabetic eye exam: Advised to follow up with Ophthalmology for diabetic eye exam

## 2023-11-17 LAB — CMP14+EGFR
ALT: 31 [IU]/L (ref 0–44)
AST: 28 [IU]/L (ref 0–40)
Albumin: 4 g/dL (ref 3.7–4.7)
Alkaline Phosphatase: 121 [IU]/L (ref 44–121)
BUN/Creatinine Ratio: 14 (ref 10–24)
BUN: 12 mg/dL (ref 8–27)
Bilirubin Total: 0.8 mg/dL (ref 0.0–1.2)
CO2: 24 mmol/L (ref 20–29)
Calcium: 9.4 mg/dL (ref 8.6–10.2)
Chloride: 101 mmol/L (ref 96–106)
Creatinine, Ser: 0.88 mg/dL (ref 0.76–1.27)
Globulin, Total: 2.6 g/dL (ref 1.5–4.5)
Glucose: 187 mg/dL — ABNORMAL HIGH (ref 70–99)
Potassium: 4.7 mmol/L (ref 3.5–5.2)
Sodium: 138 mmol/L (ref 134–144)
Total Protein: 6.6 g/dL (ref 6.0–8.5)
eGFR: 86 mL/min/{1.73_m2} (ref >59)

## 2023-11-17 LAB — HEMOGLOBIN A1C
Est. average glucose Bld gHb Est-mCnc: 140 mg/dL
Hgb A1c MFr Bld: 6.5 % — ABNORMAL HIGH (ref 4.8–5.6)

## 2023-11-26 ENCOUNTER — Other Ambulatory Visit: Payer: Self-pay | Admitting: Internal Medicine

## 2023-11-26 NOTE — Telephone Encounter (Signed)
 Copied from CRM [id number]. Topic: Clinical - Medication Refill >> Nov 26, 2023  1:50 PM Ivette P wrote: Most Recent Primary Care Visit:  Provider: Anabel Halon  Department: [facility]  Visit Type: OFFICE VISIT  Date: 11/16/2023  Medication: nitroGLYCERIN (NITROSTAT) 0.4 MG SL tablet  Has the patient contacted their pharmacy? Yes (Agent: If no, request that the patient contact the pharmacy for the refill. If patient does not wish to contact the pharmacy document the reason why and proceed with request.) (Agent: If yes, when and what did the pharmacy advise?)  Is this the correct pharmacy for this prescription? Yes If no, delete pharmacy and type the correct one.  This is the patient's preferred pharmacy:  [id number] - [place], [place] - [address] [place] [address] Phone: [phone] Fax: [phone]   Has the prescription been filled recently? No  Is the patient out of the medication? Yes  Has the patient been seen for an appointment in the last year OR does the patient have an upcoming appointment? Yes  Can we respond through MyChart? Yes  Agent: Please be advised that Rx refills may take up to 3 business days. We ask that you follow-up with your pharmacy.

## 2023-11-27 MED ORDER — NITROGLYCERIN 0.4 MG SL SUBL: 0.4000 mg | SUBLINGUAL_TABLET | SUBLINGUAL | 0 refills | Status: DC | PRN

## 2023-12-03 ENCOUNTER — Other Ambulatory Visit: Payer: Self-pay

## 2023-12-03 ENCOUNTER — Emergency Department (HOSPITAL_COMMUNITY)

## 2023-12-03 ENCOUNTER — Emergency Department (HOSPITAL_COMMUNITY)
Admission: EM | Admit: 2023-12-03 | Discharge: 2023-12-03 | Disposition: A | Discharge status: Home / Self Care | Attending: Emergency Medicine | Admitting: Emergency Medicine

## 2023-12-03 ENCOUNTER — Encounter (HOSPITAL_COMMUNITY): Payer: Self-pay

## 2023-12-03 ENCOUNTER — Other Ambulatory Visit: Payer: Self-pay | Admitting: Internal Medicine

## 2023-12-03 DIAGNOSIS — Z7982 Long term (current) use of aspirin: Secondary | ICD-10-CM | POA: Diagnosis not present

## 2023-12-03 DIAGNOSIS — Z0189 Encounter for other specified special examinations: Secondary | ICD-10-CM | POA: Diagnosis not present

## 2023-12-03 DIAGNOSIS — R9089 Other abnormal findings on diagnostic imaging of central nervous system: Secondary | ICD-10-CM | POA: Diagnosis not present

## 2023-12-03 DIAGNOSIS — R29818 Other symptoms and signs involving the nervous system: Secondary | ICD-10-CM | POA: Diagnosis not present

## 2023-12-03 DIAGNOSIS — R42 Dizziness and giddiness: Secondary | ICD-10-CM | POA: Diagnosis not present

## 2023-12-03 DIAGNOSIS — R269 Unspecified abnormalities of gait and mobility: Secondary | ICD-10-CM | POA: Diagnosis not present

## 2023-12-03 LAB — BASIC METABOLIC PANEL
Anion gap: 9 (ref 5–15)
BUN: 15 mg/dL (ref 8–23)
CO2: 23 mmol/L (ref 22–32)
Calcium: 9.2 mg/dL (ref 8.9–10.3)
Chloride: 103 mmol/L (ref 98–111)
Creatinine, Ser: 0.8 mg/dL (ref 0.61–1.24)
GFR, Estimated: >60 mL/min (ref >60)
Glucose, Bld: 147 mg/dL — ABNORMAL HIGH (ref 70–99)
Potassium: 4.2 mmol/L (ref 3.5–5.1)
Sodium: 135 mmol/L (ref 135–145)

## 2023-12-03 LAB — CBC WITH DIFFERENTIAL/PLATELET
Abs Immature Granulocytes: 0.02 10*3/uL (ref 0.00–0.07)
Basophils Absolute: 0 10*3/uL (ref 0.0–0.1)
Basophils Relative: 1 %
Eosinophils Absolute: 0.1 10*3/uL (ref 0.0–0.5)
Eosinophils Relative: 1 %
HCT: 44.1 % (ref 39.0–52.0)
Hemoglobin: 14.4 g/dL (ref 13.0–17.0)
Immature Granulocytes: 0 %
Lymphocytes Relative: 13 %
Lymphs Abs: 0.9 10*3/uL (ref 0.7–4.0)
MCH: 30.4 pg (ref 26.0–34.0)
MCHC: 32.7 g/dL (ref 30.0–36.0)
MCV: 93.2 fL (ref 80.0–100.0)
Monocytes Absolute: 0.7 10*3/uL (ref 0.1–1.0)
Monocytes Relative: 9 %
Neutro Abs: 5.3 10*3/uL (ref 1.7–7.7)
Neutrophils Relative %: 76 %
Platelets: 188 10*3/uL (ref 150–400)
RBC: 4.73 MIL/uL (ref 4.22–5.81)
RDW: 13.4 % (ref 11.5–15.5)
WBC: 7 10*3/uL (ref 4.0–10.5)
nRBC: 0 % (ref 0.0–0.2)

## 2023-12-03 LAB — URINALYSIS, ROUTINE W REFLEX MICROSCOPIC
Bacteria, UA: NONE SEEN
Bilirubin Urine: NEGATIVE
Glucose, UA: NEGATIVE mg/dL
Ketones, ur: NEGATIVE mg/dL
Leukocytes,Ua: NEGATIVE
Nitrite: NEGATIVE
Protein, ur: NEGATIVE mg/dL
Specific Gravity, Urine: 1.002 — ABNORMAL LOW (ref 1.005–1.030)
pH: 6 (ref 5.0–8.0)

## 2023-12-03 LAB — CBG MONITORING, ED: Glucose-Capillary: 87 mg/dL (ref 70–99)

## 2023-12-03 MED ORDER — MECLIZINE HCL 12.5 MG PO TABS
12.5000 mg | ORAL_TABLET | Freq: Once | ORAL | Status: AC
  Administered 2023-12-03: 12.5 mg via ORAL
  Filled 2023-12-03: qty 1

## 2023-12-03 MED ORDER — NITROGLYCERIN 0.4 MG SL SUBL: 0.4000 mg | SUBLINGUAL_TABLET | SUBLINGUAL | 0 refills | Status: DC | PRN

## 2023-12-03 MED ORDER — MECLIZINE HCL 12.5 MG PO TABS: 12.5000 mg | ORAL_TABLET | Freq: Three times a day (TID) | ORAL | 0 refills | Status: DC | PRN

## 2023-12-03 MED ORDER — LORAZEPAM 2 MG/ML IJ SOLN
1.0000 mg | INTRAMUSCULAR | Status: DC | PRN
  Administered 2023-12-03: 1 mg via INTRAVENOUS
  Filled 2023-12-03: qty 1

## 2023-12-03 NOTE — ED Notes (Signed)
 Patient transported to MRI

## 2023-12-03 NOTE — ED Notes (Signed)
 Pt transported to MRI

## 2023-12-03 NOTE — ED Provider Notes (Signed)
 [place] EMERGENCY DEPARTMENT AT [facility] Provider Note   CSN: [id number] Arrival date & time: 12/03/23  4098     History {Add pertinent medical, surgical, social history, OB history to HPI:1} Chief Complaint  Patient presents with   Dizziness    Shawn Lopez is a 83 y.o. male.  HPI Presents with dizziness.  Dizziness since last week..  Dizziness is worse with upright positioning and ambulation.  No focal weakness, no speech difficulty, vision changes.    Home Medications Prior to Admission medications   Medication Sig Start Date End Date Taking? Authorizing Provider  aspirin EC 81 MG tablet Take 1 tablet (81 mg total) by mouth daily with breakfast. 05/11/23  Yes Emokpae, Courage, MD  atorvastatin (LIPITOR) 80 MG tablet Take 1 tablet (80 mg total) by mouth every evening. 05/11/23  Yes Emokpae, Courage, MD  clopidogrel (PLAVIX) 75 MG tablet TAKE 1 TABLET BY MOUTH EACH DAY FOR BLOOD THINNER 08/06/23  Yes Anabel Halon, MD  esomeprazole (NEXIUM) 20 MG capsule Take 1 capsule (20 mg total) by mouth 2 (two) times daily before a meal. Patient taking differently: Take 20 mg by mouth as needed (acid reflux). 11/16/23  Yes Anabel Halon, MD  glimepiride (AMARYL) 2 MG tablet Take 1 tablet (2 mg total) by mouth every morning. Patient taking differently: Take 1 mg by mouth 2 (two) times daily. 11/16/23  Yes Anabel Halon, MD  lisinopril (ZESTRIL) 10 MG tablet Take 1 tablet (10 mg total) by mouth daily. Patient taking differently: Take 5 mg by mouth 2 (two) times daily. 07/13/23   Anabel Halon, MD  nitroGLYCERIN (NITROSTAT) 0.4 MG SL tablet Place 1 tablet (0.4 mg total) under the tongue every 5 (five) minutes as needed for chest pain. For 3 doses only Patient not taking: Reported on 12/03/2023 11/27/23   Anabel Halon, MD      Allergies    Brilinta [ticagrelor], Amlodipine, and Other    Review of Systems   Review of Systems  Physical Exam Updated Vital  Signs BP 139/69 (BP Location: Left Arm)   Pulse (!) 50   Temp 98.1 F (36.7 C) (Oral)   Resp 20   Ht 5\' 10"  (1.778 m)   Wt 83.1 kg   SpO2 98%   BMI 26.29 kg/m  Physical Exam Vitals and nursing note reviewed.  Constitutional:      General: He is not in acute distress.    Appearance: He is well-developed.  HENT:     Head: Normocephalic and atraumatic.  Eyes:     Conjunctiva/sclera: Conjunctivae normal.  Cardiovascular:     Rate and Rhythm: Normal rate and regular rhythm.  Pulmonary:     Effort: Pulmonary effort is normal. No respiratory distress.     Breath sounds: No stridor.  Abdominal:     General: There is no distension.  Skin:    General: Skin is warm and dry.  Neurological:     Mental Status: He is alert and oriented to person, place, and time.     Cranial Nerves: No dysarthria.     Motor: No tremor.     Comments: No focal mass, no facial asymmetry speech deficit.     ED Results / Procedures / Treatments   Labs (all labs ordered are listed, but only abnormal results are displayed) Labs Reviewed  BASIC METABOLIC PANEL - Abnormal; Notable for the following components:      Result Value   Glucose, Bld 147 (*)  All other components within normal limits  URINALYSIS, ROUTINE W REFLEX MICROSCOPIC - Abnormal; Notable for the following components:   Color, Urine COLORLESS (*)    Specific Gravity, Urine 1.002 (*)    Hgb urine dipstick SMALL (*)    All other components within normal limits  CBC WITH DIFFERENTIAL/PLATELET  CBG MONITORING, ED    EKG None  Radiology No results found.  Procedures Procedures  {Document cardiac monitor, telemetry assessment procedure when appropriate:1}  Medications Ordered in ED Medications  LORazepam (ATIVAN) injection 1 mg (1 mg Intravenous Given 12/03/23 1201)    ED Course/ Medical Decision Making/ A&P   {   Click here for ABCD2, HEART and other calculatorsREFRESH Note before signing :1}                               Medical Decision Making Adult male presents with dizziness for about 1 week.  Patient is awake, alert sitting, feels unsteady with upright positioning.  Less likely cardiogenic given his description of no chest pain, no palpitations.  Amount and/or Complexity of Data Reviewed External Data Reviewed: notes. Labs: ordered. Decision-making details documented in ED Course. Radiology: ordered and independent interpretation performed. Decision-making details documented in ED Course. ECG/medicine tests: ordered and independent interpretation performed. Decision-making details documented in ED Course.  Risk Prescription drug management.   ***  {Document critical care time when appropriate:1} {Document review of labs and clinical decision tools ie heart score, Chads2Vasc2 etc:1}  {Document your independent review of radiology images, and any outside records:1} {Document your discussion with family members, caretakers, and with consultants:1} {Document social determinants of health affecting pt's care:1} {Document your decision making why or why not admission, treatments were needed:1} Final Clinical Impression(s) / ED Diagnoses Final diagnoses:  None    Rx / DC Orders ED Discharge Orders     None

## 2023-12-03 NOTE — Telephone Encounter (Signed)
 Copied from CRM [id number]. Topic: Clinical - Medication Refill >> Dec 03, 2023  4:20 PM Everette C wrote: Most Recent Primary Care Visit:  Provider: Anabel Halon  Department: [facility]  Visit Type: OFFICE VISIT  Date: 11/16/2023  Medication: nitroGLYCERIN (NITROSTAT) 0.4 MG SL tablet [[id number]]  Has the patient contacted their pharmacy? Yes (Agent: If no, request that the patient contact the pharmacy for the refill. If patient does not wish to contact the pharmacy document the reason why and proceed with request.) (Agent: If yes, when and what did the pharmacy advise?)  Is this the correct pharmacy for this prescription? Yes If no, delete pharmacy and type the correct one.  This is the patient's preferred pharmacy:  [facility] #[id number] - [place], [place] - [address] [place] [address] Phone: [phone] Fax: [phone]   Has the prescription been filled recently? Yes  Is the patient out of the medication? Yes  Has the patient been seen for an appointment in the last year OR does the patient have an upcoming appointment? Yes  Can we respond through MyChart? No  Agent: Please be advised that Rx refills may take up to 3 business days. We ask that you follow-up with your pharmacy.

## 2023-12-03 NOTE — ED Notes (Signed)
 Pt states he takes heart medication and his pulse runs from 43-50 at baseline

## 2023-12-03 NOTE — ED Triage Notes (Signed)
 Pt arrived via POV c/o dizziness since last week. Pt denies falling. Pt reports dizziness is worse when he stands up.

## 2023-12-03 NOTE — Telephone Encounter (Signed)
 Last Fill" Unk  Last OV: 11/16/23 Next OV: 05/23/24  Routing to provider for review/authorization.

## 2023-12-03 NOTE — Discharge Instructions (Signed)
 Please be sure to with your physician via telephone.  Return here for concerning changes in your condition.

## 2023-12-04 ENCOUNTER — Encounter: Payer: Self-pay | Admitting: Cardiology

## 2023-12-04 ENCOUNTER — Ambulatory Visit: Attending: Cardiology

## 2023-12-04 ENCOUNTER — Ambulatory Visit: Payer: Medicare HMO | Attending: Cardiology | Admitting: Cardiology

## 2023-12-04 VITALS — BP 120/58 | HR 57 | Ht 70.0 in | Wt 182.8 lb

## 2023-12-04 DIAGNOSIS — I25119 Atherosclerotic heart disease of native coronary artery with unspecified angina pectoris: Secondary | ICD-10-CM | POA: Diagnosis not present

## 2023-12-04 DIAGNOSIS — R001 Bradycardia, unspecified: Secondary | ICD-10-CM

## 2023-12-04 DIAGNOSIS — E782 Mixed hyperlipidemia: Secondary | ICD-10-CM

## 2023-12-04 DIAGNOSIS — I10 Essential (primary) hypertension: Secondary | ICD-10-CM | POA: Diagnosis not present

## 2023-12-04 DIAGNOSIS — R42 Dizziness and giddiness: Secondary | ICD-10-CM

## 2023-12-04 DIAGNOSIS — I2510 Atherosclerotic heart disease of native coronary artery without angina pectoris: Secondary | ICD-10-CM

## 2023-12-04 MED ORDER — NITROGLYCERIN 0.4 MG SL SUBL: 0.4000 mg | SUBLINGUAL_TABLET | SUBLINGUAL | 3 refills | Status: AC | PRN

## 2023-12-04 NOTE — Progress Notes (Signed)
 Cardiology Office Note  Date: 12/04/2023   ID: Shawn, Lopez 12-19-1940, MRN [id number]  History of Present Illness: Shawn Lopez is an 83 y.o. male last seen in September 2024 by Ms. Strader PA-C, I reviewed the note.  He is here for a follow-up visit.  He does not report any angina or nitroglycerin use.  Still works as a Merchandiser, retail in [employer].  He states that he has had increasing trouble with dizziness, describes a sense of nausea and vague sensation in his chest, no obvious precipitant, symptoms typically pass without intervention.  He has had no frank syncope.  He was seen in the ER yesterday, I reviewed the records including lab work and brain MRI.  He was given a prescription for as needed meclizine.  I reviewed his medications which are otherwise unchanged from a cardiac perspective.  He remains bradycardic with heart rate in the 50s, not on any AV nodal blockers.  He needs a refill for fresh nitroglycerin.  Physical Exam: VS:  BP (!) 120/58   Pulse (!) 57   Ht 5\' 10"  (1.778 m)   Wt 182 lb 12.8 oz (82.9 kg)   SpO2 95%   BMI 26.23 kg/m , BMI Body mass index is 26.23 kg/m.  Wt Readings from Last 3 Encounters:  12/04/23 182 lb 12.8 oz (82.9 kg)  12/03/23 183 lb 3.2 oz (83.1 kg)  11/16/23 183 lb 3.2 oz (83.1 kg)    General: Patient appears comfortable at rest. HEENT: Conjunctiva and lids normal. Neck: Supple, no elevated JVP or carotid bruits. Lungs: Clear to auscultation, nonlabored breathing at rest. Cardiac: Regular rate and rhythm, no S3, 1/6 systolic murmur. Extremities: No pitting edema.  ECG:  An ECG dated 05/10/2023 was personally reviewed today and demonstrated:  Sinus rhythm.  Labwork: 11/16/2023: ALT 31; AST 28 12/03/2023: BUN 15; Creatinine, Ser 0.80; Hemoglobin 14.4; Platelets 188; Potassium 4.2; Sodium 135     Component Value Date/Time   CHOL 116 06/22/2023 0828   TRIG 102 06/22/2023 0828   HDL 38 (L) 06/22/2023 0828   CHOLHDL 3.1  06/22/2023 0828   CHOLHDL 3.8 05/11/2023 0324   VLDL 23 05/11/2023 0324   LDLCALC 59 06/22/2023 0828   Other Studies Reviewed Today:  Echocardiogram 05/11/2023:  1. Left ventricular ejection fraction, by estimation, is 60 to 65%. The  left ventricle has normal function. The left ventricle has no regional  wall motion abnormalities. Left ventricular diastolic parameters are  consistent with Grade I diastolic  dysfunction (impaired relaxation).   2. Right ventricular systolic function is normal. The right ventricular  size is normal. There is normal pulmonary artery systolic pressure.   3. The mitral valve is normal in structure. Trivial mitral valve  regurgitation. No evidence of mitral stenosis.   4. The tricuspid valve is abnormal.   5. The aortic valve is tricuspid. There is mild calcification of the  aortic valve. There is mild thickening of the aortic valve. Aortic valve  regurgitation is not visualized. No aortic stenosis is present.   6. The inferior vena cava is normal in size with greater than 50%  respiratory variability, suggesting right atrial pressure of 3 mmHg.   Assessment and Plan:  1.  Episodic lightheadedness without syncope.  This has been a recurring symptom for him, increasing in the last month.  Was seen in the ER yesterday without acute findings, given prescription for meclizine.  He is chronically bradycardic as noted previously, did wear a cardiac monitor  last summer which did not identify any specific etiology for his symptoms.  Would still have concern about progressive conduction system disease.  Will place a 7-day ZIO for reevaluation.   2.  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  Follow-up cardiac catheterization in July 2020 demonstrated patent LAD stent sites with moderate in-stent restenosis in the mid LAD segment as well as widely patent first  diagonal stent with moderate stenosis proximal to the stent.  Lexiscan Myoview in March 2024 showed no active ischemia with LVEF 64%.  He does not describe any angina at this time, needs refill for fresh bottle of nitroglycerin. Continue aspirin 81 mg daily, Plavix 75 mg daily, and Lipitor 80 mg daily.   3.  Primary hypertension.  Is well-controlled today.  Continue lisinopril 5 mg twice daily.   4.  Mixed hyperlipidemia.  LDL 59 in September 2024.  Continue Lipitor 80 mg daily.  Disposition:  Follow up  3 months.  Signed, Jonelle Sidle, M.D., F.A.C.C. [facility]

## 2023-12-04 NOTE — Patient Instructions (Signed)
 Medication Instructions:   Your physician recommends that you continue on your current medications as directed. Please refer to the Current Medication list given to you today.   Labwork: None today  Testing/Procedures: ZIO XT- Long Term Monitor Instructions   Your physician has requested you wear your ZIO patch monitor___7____days.   This is a single patch monitor.  [employer] supplies one patch monitor per enrollment.  Additional stickers are not available.   Please do not apply patch if you will be having a Nuclear Stress Test, Echocardiogram, Cardiac CT, MRI, or Chest Xray during the time frame you would be wearing the monitor. The patch cannot be worn during these tests.  You cannot remove and re-apply the ZIO XT patch monitor.  Do not shower for the first 24 hours.  You may shower after the first 24 hours.   Press button if you feel a symptom. You will hear a small click.  Record Date, Time and Symptom in the Patient Log Book.   When you are ready to remove patch, follow instructions on last 2 pages of Patient Log Book.  Stick patch monitor onto last page of Patient Log Book.   Place Patient Log Book in [place] box.  Use locking tab on box and tape box closed securely.  The [employer] and [employer] has [employer] on it.  Please place in mailbox as soon as possible.  Your physician should have your test results approximately 7 days after the monitor has been mailed back to [facility].   Call [facility] Customer Care at [phone] if you have questions regarding your ZIO XT patch monitor.  Call them immediately if you see an orange light blinking on your monitor.   If your monitor falls off in less than 4 days contact our Monitor department at [phone].  If your monitor becomes loose or falls off after 4 days call [employer] at [phone] for suggestions on securing your monitor.    Follow-Up: 3 months  Any Other Special Instructions Will Be Listed Below (If  Applicable).  If you need a refill on your cardiac medications before your next appointment, please call your pharmacy.

## 2023-12-07 ENCOUNTER — Ambulatory Visit: Payer: Medicare HMO | Admitting: Cardiology

## 2023-12-20 DIAGNOSIS — R001 Bradycardia, unspecified: Secondary | ICD-10-CM | POA: Diagnosis not present

## 2023-12-20 DIAGNOSIS — R42 Dizziness and giddiness: Secondary | ICD-10-CM | POA: Diagnosis not present

## 2024-01-17 DIAGNOSIS — Z961 Presence of intraocular lens: Secondary | ICD-10-CM | POA: Diagnosis not present

## 2024-01-17 DIAGNOSIS — E119 Type 2 diabetes mellitus without complications: Secondary | ICD-10-CM | POA: Diagnosis not present

## 2024-01-17 DIAGNOSIS — H4322 Crystalline deposits in vitreous body, left eye: Secondary | ICD-10-CM | POA: Diagnosis not present

## 2024-02-20 ENCOUNTER — Other Ambulatory Visit: Payer: Self-pay | Admitting: Internal Medicine

## 2024-02-20 DIAGNOSIS — I10 Essential (primary) hypertension: Secondary | ICD-10-CM

## 2024-03-13 ENCOUNTER — Ambulatory Visit: Attending: Student | Admitting: Student

## 2024-03-13 ENCOUNTER — Encounter: Payer: Self-pay | Admitting: Student

## 2024-03-13 NOTE — Progress Notes (Deleted)
 Cardiology Office Note    Date:  03/13/2024  ID:  Shawn Lopez, Shawn Lopez [birth year], MRN [id number] Cardiologist: Teddie Favre, MD    History of Present Illness:    Shawn Lopez is a 83 y.o. male with past medical history of CAD (s/p stenting to LAD and OM1 in 12/2016 with patent stents by cath in 03/2019, low-risk NST's in 01/2021 and 11/2022), palpitations (PAC's and PVC's with brief SVT by prior monitor), HTN, HLD and Type II DM who presents to the 44-month follow-up.   He was last exam by Dr. Londa Rival in 11/2023 and reported having episodes of dizziness along with nausea and vague sensations in his chest which had led to recent ED evaluation.  MRI brain had recently been obtained and showed no acute intracranial abnormalities.  Was provided with a prescription for meclizine  in the ED.  A repeat 7-day Zio patch was recommended for further assessment.  Otherwise, was continue on his current cardiac medications with ASA 81 mg daily, atorvastatin  80 mg daily, Plavix  75 mg daily and Lisinopril  10 mg daily.  He Zio patch showed predominantly normal sinus rhythm with an average heart rate of 51 bpm.  He did have rare PACs and PVCs representing less than 1% of total beats.  He did have 2 episodes of NSVT with the longest lasting for 19 beats and episodes of PSVT with the longest lasting 14 seconds.  He was not started on medical therapy for this given his baseline bradycardia.  ROS: ***  Studies Reviewed:   EKG: EKG is*** ordered today and demonstrates ***   EKG Interpretation Date/Time:    Ventricular Rate:    PR Interval:    QRS Duration:    QT Interval:    QTC Calculation:   R Axis:      Text Interpretation:         Echocardiogram: 04/2023 IMPRESSIONS     1. Left ventricular ejection fraction, by estimation, is 60 to 65%. The  left ventricle has normal function. The left ventricle has no regional  wall motion abnormalities. Left ventricular diastolic parameters are   consistent with Grade I diastolic  dysfunction (impaired relaxation).   2. Right ventricular systolic function is normal. The right ventricular  size is normal. There is normal pulmonary artery systolic pressure.   3. The mitral valve is normal in structure. Trivial mitral valve  regurgitation. No evidence of mitral stenosis.   4. The tricuspid valve is abnormal.   5. The aortic valve is tricuspid. There is mild calcification of the  aortic valve. There is mild thickening of the aortic valve. Aortic valve  regurgitation is not visualized. No aortic stenosis is present.   6. The inferior vena cava is normal in size with greater than 50%  respiratory variability, suggesting right atrial pressure of 3 mmHg.   Event Monitor: 11/2023 ZIO monitor reviewed.  8 days, 10 hours analyzed.   Predominant rhythm is sinus with heart rate ranging from 40 bpm up to 107 bpm and average heart rate 51 bpm. There were rare PACs including atrial couplets and triplets representing less than 1% total beats. There were rare PVCs including ventricular couplets and triplets representing less than 1% total beats.  Also limited episodes of ventricular bigeminy and trigeminy. Two episodes of NSVT were noted, the longest of which was 19 beats. There were several (14) episodes of PSVT, the longest of which lasted approximately 14 seconds. No pauses or high degree heart block.  Risk Assessment/Calculations:   {  Does this patient have ATRIAL FIBRILLATION?:[id number]} No BP recorded.  {Refresh Note OR Click here to enter BP  :1}***         Physical Exam:   VS:  There were no vitals taken for this visit.   Wt Readings from Last 3 Encounters:  12/04/23 182 lb 12.8 oz (82.9 kg)  12/03/23 183 lb 3.2 oz (83.1 kg)  11/16/23 183 lb 3.2 oz (83.1 kg)     GEN: Well nourished, well developed in no acute distress NECK: No JVD; No carotid bruits CARDIAC: ***RRR, no murmurs, rubs, gallops RESPIRATORY:  Clear to  auscultation without rales, wheezing or rhonchi  ABDOMEN: Appears non-distended. No obvious abdominal masses. EXTREMITIES: No clubbing or cyanosis. No edema.  Distal pedal pulses are 2+ bilaterally.   Assessment and Plan:      {Are you ordering a CV Procedure (e.g. stress test, cath, DCCV, TEE, etc)?   Press F2        :[id number]}   Signed, Dorma Gash, PA-C

## 2024-04-11 ENCOUNTER — Encounter (INDEPENDENT_AMBULATORY_CARE_PROVIDER_SITE_OTHER): Payer: Self-pay | Admitting: *Deleted

## 2024-05-23 ENCOUNTER — Ambulatory Visit (INDEPENDENT_AMBULATORY_CARE_PROVIDER_SITE_OTHER): Payer: Medicare HMO | Admitting: Internal Medicine

## 2024-05-23 ENCOUNTER — Encounter: Payer: Self-pay | Admitting: Internal Medicine

## 2024-05-23 VITALS — BP 136/73 | HR 55 | Ht 70.0 in | Wt 185.2 lb

## 2024-05-23 DIAGNOSIS — E785 Hyperlipidemia, unspecified: Secondary | ICD-10-CM

## 2024-05-23 DIAGNOSIS — Z0001 Encounter for general adult medical examination with abnormal findings: Secondary | ICD-10-CM | POA: Diagnosis not present

## 2024-05-23 DIAGNOSIS — Z7984 Long term (current) use of oral hypoglycemic drugs: Secondary | ICD-10-CM

## 2024-05-23 DIAGNOSIS — K648 Other hemorrhoids: Secondary | ICD-10-CM | POA: Diagnosis not present

## 2024-05-23 DIAGNOSIS — N400 Enlarged prostate without lower urinary tract symptoms: Secondary | ICD-10-CM | POA: Insufficient documentation

## 2024-05-23 DIAGNOSIS — I25118 Atherosclerotic heart disease of native coronary artery with other forms of angina pectoris: Secondary | ICD-10-CM

## 2024-05-23 DIAGNOSIS — K219 Gastro-esophageal reflux disease without esophagitis: Secondary | ICD-10-CM

## 2024-05-23 DIAGNOSIS — I10 Essential (primary) hypertension: Secondary | ICD-10-CM | POA: Diagnosis not present

## 2024-05-23 DIAGNOSIS — E1169 Type 2 diabetes mellitus with other specified complication: Secondary | ICD-10-CM

## 2024-05-23 NOTE — Progress Notes (Signed)
 Established Patient Office Visit  Subjective:  Patient ID: Shawn Lopez, male    DOB: [birth year]  Age: 83 y.o. MRN: [id number]  CC:  Chief Complaint  Patient presents with   Medical Management of Chronic Issues    6 month f/u , would like to discuss psa levels, also having constipation issues.     HPI Shawn Lopez is a 83 y.o. male with past medical history of CAD s/p stent placement, HTN, type II DM, HLD and chronic shoulder pain who presents for f/u of his chronic medical conditions.  CAD status post stent placement and HTN: He takes aspirin , Plavix  and statin.  His blood pressure is wnl today.  He is taking lisinopril  10 mg once daily regularly now. He denies any headache, dizziness, chest pain, dyspnea or palpitations currently.   Type II DM: He takes glimepiride  1 mg (half tablet of 2 mg) BID again. He was given Jardiance  10 mg QD, but had dizziness with it.  His last HbA1c was 6.2.  Denies hypoglycemic episodes recently.  Denies any polyuria or polyphagia currently.  GERD: He is taking Nexium  20 mg BID currently. He is responding better to Nexium  now.  He had EGD in 07/24, which showed H. pylori infection.  He has completed  triple therapy for it.  Denies any nausea, vomiting, dysphagia or odynophagia currently.   Chronic constipation: She reports recent worsening of constipation for the last 1 month.  He has tried taking MiraLAX  without much relief.  Denies any melena or hematochezia.  He reports decreased caliber of stool recently as well.  His last colonoscopy in 2024 showed multiple polyps and internal hemorrhoids.   Past Medical History:  Diagnosis Date   Arthritis    Atrial fibrillation (HCC) 2006   a. in 2006; not on anticoagulation, no recent atrial fib seen as of 2018.   Borderline hypertension    CAD (coronary artery disease)    a. NSTEMI 12/2016: cath showing 95% mid-LAD stenosis, 75% prox-LAD stenosis and 75% 1st Diag stenosis. DES to the proximal  and mid-LAD along with the 1st Diagonal..   Chronic back pain    Prior trauma with vertebral fracture   Hyperlipidemia    Hypertension    Osteopenia    Sinus bradycardia    Type 2 diabetes mellitus [facility])     Past Surgical History:  Procedure Laterality Date   APPENDECTOMY  1960   BIOPSY  04/20/2023   Procedure: BIOPSY;  Surgeon: Cinderella Deatrice FALCON, MD;  Location: [facility];  Service: Endoscopy;;   CATARACT EXTRACTION  2012   right eye,lens implantation   CHOLECYSTECTOMY     CIRCUMCISION  2010   meatotomy and dilatation   COLONOSCOPY N/A 01/14/2013   Procedure: COLONOSCOPY;  Surgeon: Oneil DELENA Budge, MD;  Location: [facility];  Service: Gastroenterology;  Laterality: N/A;   COLONOSCOPY WITH PROPOFOL  N/A 04/20/2023   Procedure: COLONOSCOPY WITH PROPOFOL ;  Surgeon: Cinderella Deatrice FALCON, MD;  Location: [facility];  Service: Endoscopy;  Laterality: N/A;  7:30am;asa 3   CORONARY ANGIOGRAPHY N/A 01/15/2017   Procedure: Coronary Angiography;  Surgeon: Dorn JINNY Lesches, MD;  Location: [facility] INVASIVE CV LAB;  Service: Cardiovascular;  Laterality: N/A;   CORONARY PRESSURE/FFR STUDY N/A 04/04/2019   Procedure: INTRAVASCULAR PRESSURE WIRE/FFR STUDY;  Surgeon: Mady Bruckner, MD;  Location: [facility] INVASIVE CV LAB;  Service: Cardiovascular;  Laterality: N/A;   CORONARY STENT INTERVENTION N/A 01/15/2017   Procedure: Coronary Stent Intervention;  Surgeon: Dorn JINNY  Court, MD;  Location: [facility] INVASIVE CV LAB;  Service: Cardiovascular;  Laterality: N/A;   ESOPHAGOGASTRODUODENOSCOPY (EGD) WITH PROPOFOL  N/A 04/20/2023   Procedure: ESOPHAGOGASTRODUODENOSCOPY (EGD) WITH PROPOFOL ;  Surgeon: Cinderella Deatrice FALCON, MD;  [facility];  Service: Endoscopy;  Laterality: N/A;  7:30am;asa 3   LEFT HEART CATH AND CORONARY ANGIOGRAPHY N/A 04/04/2019   Procedure: LEFT HEART CATH AND CORONARY ANGIOGRAPHY;  Surgeon: Mady Bruckner, MD;  Location: [facility] INVASIVE CV LAB;  Service: Cardiovascular;  Laterality: N/A;    POLYPECTOMY  04/20/2023   Procedure: POLYPECTOMY;  Surgeon: Cinderella Deatrice FALCON, MD;  [facility];  Service: Endoscopy;;    Family History  Problem Relation Age of Onset   CAD Father    Heart attack Brother     Social History   Socioeconomic History   Marital status: Married    Spouse name: Not on file   Number of children: 4   Years of education: Not on file   Highest education level: Not on file  Occupational History   Occupation: Holiday representative    Comment: [employer] fast stream[employer]  Tobacco Use   Smoking status: Former    Current packs/day: 0.00    Average packs/day: 1.5 packs/day for 15.0 years (22.5 ttl pk-yrs)    Types: Cigarettes    Start date: 04/20/1982    Quit date: 04/20/1997    Years since quitting: 27.1    Passive exposure: Past   Smokeless tobacco: Never  Vaping Use   Vaping status: Never Used  Substance and Sexual Activity   Alcohol use: No   Drug use: No   Sexual activity: Not on file  Other Topics Concern   Not on file  Social History Narrative   Not on file   Social Drivers of Health   Financial Resource Strain: Low Risk  (10/02/2023)   Overall Financial Resource Strain (CARDIA)    Difficulty of Paying Living Expenses: Not hard at all  Food Insecurity: No Food Insecurity (10/02/2023)   Hunger Vital Sign    Worried About Running Out of Food in the Last Year: Never true    Ran Out of Food in the Last Year: Never true  Transportation Needs: No Transportation Needs (10/02/2023)   PRAPARE - Administrator, Civil Service (Medical): No    Lack of Transportation (Non-Medical): No  Physical Activity: Sufficiently Active (10/02/2023)   Exercise Vital Sign    Days of Exercise per Week: 7 days    Minutes of Exercise per Session: 30 min  Stress: No Stress Concern Present (10/02/2023)   [employer] of Occupational Health - Occupational Stress Questionnaire    Feeling of Stress : Not at all  Social Connections: Moderately Integrated  (10/02/2023)   Social Connection and Isolation Panel    Frequency of Communication with Friends and Family: More than three times a week    Frequency of Social Gatherings with Friends and Family: More than three times a week    Attends Religious Services: More than 4 times per year    Active Member of [employer] or Organizations: No    Attends Banker Meetings: Never    Marital Status: Married  Catering manager Violence: Not At Risk (10/02/2023)   Humiliation, Afraid, Rape, and Kick questionnaire    Fear of Current or Ex-Partner: No    Emotionally Abused: No    Physically Abused: No    Sexually Abused: No    Outpatient Medications Prior to Visit  Medication Sig Dispense  Refill   aspirin  EC 81 MG tablet Take 1 tablet (81 mg total) by mouth daily with breakfast. 90 tablet 4   atorvastatin  (LIPITOR ) 80 MG tablet Take 1 tablet (80 mg total) by mouth every evening. 90 tablet 3   clopidogrel  (PLAVIX ) 75 MG tablet TAKE 1 TABLET BY MOUTH EACH DAY FOR BLOOD THINNER 90 tablet 3   esomeprazole  (NEXIUM ) 20 MG capsule Take 1 capsule (20 mg total) by mouth 2 (two) times daily before a meal. (Patient taking differently: Take 20 mg by mouth as needed (acid reflux).) 60 capsule 5   glimepiride  (AMARYL ) 2 MG tablet Take 1 tablet (2 mg total) by mouth every morning. (Patient taking differently: Take 1 mg by mouth 2 (two) times daily.) 90 tablet 1   lisinopril  (ZESTRIL ) 10 MG tablet TAKE 1 TABLET(10 MG) BY MOUTH DAILY 90 tablet 1   meclizine  (ANTIVERT ) 12.5 MG tablet Take 1 tablet (12.5 mg total) by mouth 3 (three) times daily as needed for dizziness. 15 tablet 0   nitroGLYCERIN  (NITROSTAT ) 0.4 MG SL tablet Place 1 tablet (0.4 mg total) under the tongue every 5 (five) minutes as needed for chest pain. For 3 doses only 25 tablet 3   No facility-administered medications prior to visit.    Allergies  Allergen Reactions   Brilinta  [Ticagrelor ]     Dyspnea    Amlodipine      Hypotension    Other Other  (See Comments)    Pt states antibiotics give him the shakes    ROS Review of Systems  Constitutional:  Negative for chills and fever.  HENT:  Negative for congestion and sore throat.   Eyes:  Negative for pain and discharge.  Respiratory:  Negative for cough and shortness of breath.   Cardiovascular:  Negative for chest pain and palpitations.  Gastrointestinal:  Positive for constipation. Negative for diarrhea, nausea and vomiting.  Endocrine: Negative for polydipsia and polyuria.  Genitourinary:  Negative for dysuria and hematuria.  Musculoskeletal:  Positive for arthralgias (R shoulder). Negative for neck pain and neck stiffness.  Skin:  Negative for rash.  Neurological:  Negative for dizziness, weakness, numbness and headaches.  Psychiatric/Behavioral:  Negative for agitation and behavioral problems.       Objective:    Physical Exam Vitals reviewed.  Constitutional:      General: He is not in acute distress.    Appearance: He is not diaphoretic.  HENT:     Head: Normocephalic and atraumatic.     Mouth/Throat:     Mouth: Mucous membranes are moist.  Eyes:     General: No scleral icterus.    Extraocular Movements: Extraocular movements intact.  Cardiovascular:     Rate and Rhythm: Normal rate and regular rhythm.     Heart sounds: Normal heart sounds. No murmur heard. Pulmonary:     Breath sounds: Normal breath sounds. No wheezing or rales.  Abdominal:     Palpations: Abdomen is soft.     Tenderness: There is no abdominal tenderness.  Musculoskeletal:     Cervical back: Neck supple. No tenderness.     Right lower leg: No edema.     Left lower leg: No edema.  Skin:    General: Skin is warm.     Findings: No rash.  Neurological:     General: No focal deficit present.     Mental Status: He is alert and oriented to person, place, and time.     Cranial Nerves: No cranial nerve deficit.  Sensory: No sensory deficit.     Motor: No weakness.  Psychiatric:         Mood and Affect: Mood normal.        Behavior: Behavior normal.     BP 136/73   Pulse (!) 55   Ht 5' 10 (1.778 m)   Wt 185 lb 3.2 oz (84 kg)   SpO2 95%   BMI 26.57 kg/m  Wt Readings from Last 3 Encounters:  05/23/24 185 lb 3.2 oz (84 kg)  12/04/23 182 lb 12.8 oz (82.9 kg)  12/03/23 183 lb 3.2 oz (83.1 kg)    Lab Results  Component Value Date   TSH 1.724 06/11/2019   Lab Results  Component Value Date   WBC 7.0 12/03/2023   HGB 14.4 12/03/2023   HCT 44.1 12/03/2023   MCV 93.2 12/03/2023   PLT 188 12/03/2023   Lab Results  Component Value Date   NA 135 12/03/2023   K 4.2 12/03/2023   CO2 23 12/03/2023   GLUCOSE 147 (H) 12/03/2023   BUN 15 12/03/2023   CREATININE 0.80 12/03/2023   BILITOT 0.8 11/16/2023   ALKPHOS 121 11/16/2023   AST 28 11/16/2023   ALT 31 11/16/2023   PROT 6.6 11/16/2023   ALBUMIN 4.0 11/16/2023   CALCIUM  9.2 12/03/2023   ANIONGAP 9 12/03/2023   EGFR 86 11/16/2023   Lab Results  Component Value Date   CHOL 116 06/22/2023   Lab Results  Component Value Date   HDL 38 (L) 06/22/2023   Lab Results  Component Value Date   LDLCALC 59 06/22/2023   Lab Results  Component Value Date   TRIG 102 06/22/2023   Lab Results  Component Value Date   CHOLHDL 3.1 06/22/2023   Lab Results  Component Value Date   HGBA1C 6.5 (H) 11/16/2023      Assessment & Plan:   Problem List Items Addressed This Visit       Cardiovascular and Mediastinum   Essential hypertension   BP Readings from Last 1 Encounters:  05/23/24 136/73   Well-controlled Advised to take lisinopril  10 mg QD regularly Counseled for compliance with the medications Advised DASH diet and moderate exercise/walking as tolerated      Relevant Orders   CMP14+EGFR   CBC with Differential/Platelet   CAD (coronary artery disease) with PCI 12/2016   S/p stent placement On aspirin , Plavix  and statin Has easy bruising -had discussion with cardiology if he can stop aspirin  or  Plavix  (last stent > 1 year ago), but due to his long segmented stent -recommend DAPT Has nitroglycerin  as needed for chest pain Followed by cardiology      Relevant Orders   CMP14+EGFR   CBC with Differential/Platelet   Internal hemorrhoid   Last colonoscopy showed internal hemorrhoid and multiple polyps Needs updated colonoscopy, advised to contact GI Advised to take Senokot-S for constipation         Digestive   Gastroesophageal reflux disease without esophagitis   Well controlled now Continue Nexium  20 mg BID, refilled - was switched by GI        Endocrine   Type 2 diabetes mellitus with other specified complication (HCC)   Lab Results  Component Value Date   HGBA1C 6.5 (H) 11/16/2023   Overall well controlled for his age Associated with HTN, CAD and HLD On glimepiride  1 mg twice daily again, considering his age and tightly controlled glycemic profile, would avoid SUs, but he has tolerated it better Did not  tolerate metformin  and Jardiance  Advised to follow diabetic diet On statin and ACEi F/u CMP and lipid panel Diabetic eye exam: Advised to follow up with Ophthalmology for diabetic eye exam      Relevant Orders   Microalbumin / creatinine urine ratio   CMP14+EGFR   Hemoglobin A1c     Genitourinary   Benign prostatic 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 Fasting blood tests today. Advised to get Shingrix and Tdap vaccines at local pharmacy.        No orders of the defined types were placed in this encounter.   Follow-up: Return in about 6 months (around 11/22/2024) for DM and CAD.    Suzzane MARLA Blanch, MD

## 2024-05-23 NOTE — Assessment & Plan Note (Signed)
S/p stent placement On aspirin, Plavix and statin Has easy bruising -had discussion with cardiology if he can stop aspirin or Plavix (last stent > 1 year ago), but due to his long segmented stent -recommend DAPT Has nitroglycerin as needed for chest pain Followed by cardiology

## 2024-05-23 NOTE — Assessment & Plan Note (Signed)
 Last colonoscopy showed internal hemorrhoid and multiple polyps Needs updated colonoscopy, advised to contact GI Advised to take Senokot-S for constipation

## 2024-05-23 NOTE — Assessment & Plan Note (Addendum)
 On Lipitor  80 mg daily Check lipid profile

## 2024-05-23 NOTE — Assessment & Plan Note (Signed)
 He has mild weakening of urinary stream, but overall manageable Check PSA

## 2024-05-23 NOTE — Assessment & Plan Note (Signed)
Physical exam as documented. Fasting blood tests today. Advised to get Shingrix and Tdap vaccines at local pharmacy. 

## 2024-05-23 NOTE — Assessment & Plan Note (Signed)
 Well controlled now Continue Nexium  20 mg BID, refilled - was switched by GI

## 2024-05-23 NOTE — Patient Instructions (Addendum)
 Please start taking Senokot-S twice daily.  Please maintain at least 64 ounces of fluid intake in a day.  Please contact GI office at  [phone] to discuss colonoscopy.  Please continue to take medications as prescribed.  Please continue to follow low carb diet and perform moderate exercise/walking as tolerated.  Please consider getting Shingrix and Tdap vaccine at local pharmacy.

## 2024-05-23 NOTE — Assessment & Plan Note (Signed)
 BP Readings from Last 1 Encounters:  05/23/24 136/73   Well-controlled Advised to take lisinopril  10 mg QD regularly Counseled for compliance with the medications Advised DASH diet and moderate exercise/walking as tolerated

## 2024-05-23 NOTE — Assessment & Plan Note (Signed)
 Lab Results  Component Value Date   HGBA1C 6.5 (H) 11/16/2023   Overall well controlled for his age Associated with HTN, CAD and HLD On glimepiride  1 mg twice daily again, considering his age and tightly controlled glycemic profile, would avoid SUs, but he has tolerated it better Did not tolerate metformin  and Jardiance  Advised to follow diabetic diet On statin and ACEi F/u CMP and lipid panel Diabetic eye exam: Advised to follow up with Ophthalmology for diabetic eye exam

## 2024-05-24 LAB — CBC WITH DIFFERENTIAL/PLATELET
Basophils Absolute: 0 x10E3/uL (ref 0.0–0.2)
Basos: 1 %
EOS (ABSOLUTE): 0.2 x10E3/uL (ref 0.0–0.4)
Eos: 3 %
Hematocrit: 46.2 % (ref 37.5–51.0)
Hemoglobin: 15.1 g/dL (ref 13.0–17.7)
Immature Grans (Abs): 0 x10E3/uL (ref 0.0–0.1)
Immature Granulocytes: 0 %
Lymphocytes Absolute: 1.7 x10E3/uL (ref 0.7–3.1)
Lymphs: 22 %
MCH: 30.9 pg (ref 26.6–33.0)
MCHC: 32.7 g/dL (ref 31.5–35.7)
MCV: 95 fL (ref 79–97)
Monocytes Absolute: 0.7 x10E3/uL (ref 0.1–0.9)
Monocytes: 9 %
Neutrophils Absolute: 5.2 x10E3/uL (ref 1.4–7.0)
Neutrophils: 65 %
Platelets: 172 x10E3/uL (ref 150–450)
RBC: 4.88 x10E6/uL (ref 4.14–5.80)
RDW: 12.6 % (ref 11.6–15.4)
WBC: 7.9 x10E3/uL (ref 3.4–10.8)

## 2024-05-24 LAB — CMP14+EGFR
ALT: 26 IU/L (ref 0–44)
AST: 24 IU/L (ref 0–40)
Albumin: 4.4 g/dL (ref 3.7–4.7)
Alkaline Phosphatase: 114 IU/L (ref 44–121)
BUN/Creatinine Ratio: 16 (ref 10–24)
BUN: 13 mg/dL (ref 8–27)
Bilirubin Total: 1 mg/dL (ref 0.0–1.2)
CO2: 17 mmol/L — ABNORMAL LOW (ref 20–29)
Calcium: 9.5 mg/dL (ref 8.6–10.2)
Chloride: 104 mmol/L (ref 96–106)
Creatinine, Ser: 0.79 mg/dL (ref 0.76–1.27)
Globulin, Total: 2.5 g/dL (ref 1.5–4.5)
Glucose: 136 mg/dL — ABNORMAL HIGH (ref 70–99)
Potassium: 4.4 mmol/L (ref 3.5–5.2)
Sodium: 140 mmol/L (ref 134–144)
Total Protein: 6.9 g/dL (ref 6.0–8.5)
eGFR: 89 mL/min/1.73 (ref >59)

## 2024-05-24 LAB — HEMOGLOBIN A1C
Est. average glucose Bld gHb Est-mCnc: 137 mg/dL
Hgb A1c MFr Bld: 6.4 % — ABNORMAL HIGH (ref 4.8–5.6)

## 2024-05-24 LAB — LIPID PANEL
Chol/HDL Ratio: 2.6 ratio (ref 0.0–5.0)
Cholesterol, Total: 115 mg/dL (ref 100–199)
HDL: 44 mg/dL (ref >39)
LDL Chol Calc (NIH): 51 mg/dL (ref 0–99)
Triglycerides: 107 mg/dL (ref 0–149)
VLDL Cholesterol Cal: 20 mg/dL (ref 5–40)

## 2024-05-24 LAB — PSA: Prostate Specific Ag, Serum: 0.5 ng/mL (ref 0.0–4.0)

## 2024-05-25 LAB — MICROALBUMIN / CREATININE URINE RATIO
Creatinine, Urine: 70 mg/dL
Microalb/Creat Ratio: <4 mg/g{creat} (ref 0–29)
Microalbumin, Urine: <3 ug/mL

## 2024-05-26 ENCOUNTER — Ambulatory Visit: Payer: Self-pay | Admitting: Internal Medicine

## 2024-06-30 ENCOUNTER — Other Ambulatory Visit: Payer: Self-pay | Admitting: Internal Medicine

## 2024-06-30 MED ORDER — ATORVASTATIN CALCIUM 80 MG PO TABS: 80.0000 mg | ORAL_TABLET | Freq: Every evening | ORAL | 3 refills | Status: AC

## 2024-06-30 NOTE — Telephone Encounter (Signed)
 Copied from CRM [id number]. Topic: Clinical - Medication Refill >> Jun 30, 2024 11:52 AM Carlatta H wrote: Medication: atorvastatin  (LIPITOR ) 80 MG tablet  Has the patient contacted their pharmacy? No (Agent: If no, request that the patient contact the pharmacy for the refill. If patient does not wish to contact the pharmacy document the reason why and proceed with request.) (Agent: If yes, when and what did the pharmacy advise?)  This is the patient's preferred pharmacy:  [id number] - [place], [place] - [address] AT SEC OF [address] [place] [address] Phone: [phone] Fax: [phone]  Is this the correct pharmacy for this prescription? Yes If no, delete pharmacy and type the correct one.   Has the prescription been filled recently? No  Is the patient out of the medication? Yes  Has the patient been seen for an appointment in the last year OR does the patient have an upcoming appointment? Yes  Can we respond through MyChart? Yes  Agent: Please be advised that Rx refills may take up to 3 business days. We ask that you follow-up with your pharmacy.

## 2024-07-31 ENCOUNTER — Emergency Department (HOSPITAL_COMMUNITY)
Admission: EM | Admit: 2024-07-31 | Discharge: 2024-07-31 | Disposition: A | Discharge status: Home / Self Care | Attending: Emergency Medicine | Admitting: Emergency Medicine

## 2024-07-31 ENCOUNTER — Other Ambulatory Visit: Payer: Self-pay

## 2024-07-31 ENCOUNTER — Emergency Department (HOSPITAL_COMMUNITY)

## 2024-07-31 ENCOUNTER — Encounter (HOSPITAL_COMMUNITY): Payer: Self-pay

## 2024-07-31 DIAGNOSIS — I2510 Atherosclerotic heart disease of native coronary artery without angina pectoris: Secondary | ICD-10-CM | POA: Insufficient documentation

## 2024-07-31 DIAGNOSIS — Z7902 Long term (current) use of antithrombotics/antiplatelets: Secondary | ICD-10-CM | POA: Insufficient documentation

## 2024-07-31 DIAGNOSIS — E119 Type 2 diabetes mellitus without complications: Secondary | ICD-10-CM | POA: Insufficient documentation

## 2024-07-31 DIAGNOSIS — I10 Essential (primary) hypertension: Secondary | ICD-10-CM | POA: Insufficient documentation

## 2024-07-31 DIAGNOSIS — Z79899 Other long term (current) drug therapy: Secondary | ICD-10-CM | POA: Insufficient documentation

## 2024-07-31 DIAGNOSIS — F17200 Nicotine dependence, unspecified, uncomplicated: Secondary | ICD-10-CM | POA: Insufficient documentation

## 2024-07-31 DIAGNOSIS — Z7982 Long term (current) use of aspirin: Secondary | ICD-10-CM | POA: Insufficient documentation

## 2024-07-31 DIAGNOSIS — Z7984 Long term (current) use of oral hypoglycemic drugs: Secondary | ICD-10-CM | POA: Insufficient documentation

## 2024-07-31 DIAGNOSIS — R0789 Other chest pain: Secondary | ICD-10-CM | POA: Insufficient documentation

## 2024-07-31 DIAGNOSIS — R42 Dizziness and giddiness: Secondary | ICD-10-CM | POA: Insufficient documentation

## 2024-07-31 LAB — CBC
HCT: 44 % (ref 39.0–52.0)
Hemoglobin: 14.7 g/dL (ref 13.0–17.0)
MCH: 31.1 pg (ref 26.0–34.0)
MCHC: 33.4 g/dL (ref 30.0–36.0)
MCV: 93.2 fL (ref 80.0–100.0)
Platelets: 182 K/uL (ref 150–400)
RBC: 4.72 MIL/uL (ref 4.22–5.81)
RDW: 13.1 % (ref 11.5–15.5)
WBC: 7.5 K/uL (ref 4.0–10.5)
nRBC: 0 % (ref 0.0–0.2)

## 2024-07-31 LAB — COMPREHENSIVE METABOLIC PANEL WITH GFR
ALT: 25 U/L (ref 0–44)
AST: 26 U/L (ref 15–41)
Albumin: 4.1 g/dL (ref 3.5–5.0)
Alkaline Phosphatase: 114 U/L (ref 38–126)
Anion gap: 9 (ref 5–15)
BUN: 14 mg/dL (ref 8–23)
CO2: 26 mmol/L (ref 22–32)
Calcium: 9 mg/dL (ref 8.9–10.3)
Chloride: 101 mmol/L (ref 98–111)
Creatinine, Ser: 0.84 mg/dL (ref 0.61–1.24)
GFR, Estimated: >60 mL/min (ref >60)
Glucose, Bld: 164 mg/dL — ABNORMAL HIGH (ref 70–99)
Potassium: 4 mmol/L (ref 3.5–5.1)
Sodium: 135 mmol/L (ref 135–145)
Total Bilirubin: 0.7 mg/dL (ref 0.0–1.2)
Total Protein: 6.7 g/dL (ref 6.5–8.1)

## 2024-07-31 LAB — TROPONIN T, HIGH SENSITIVITY
Troponin T High Sensitivity: <15 ng/L (ref 0–19)
Troponin T High Sensitivity: <15 ng/L (ref 0–19)

## 2024-07-31 LAB — LIPASE, BLOOD: Lipase: 22 U/L (ref 11–51)

## 2024-07-31 LAB — PRO BRAIN NATRIURETIC PEPTIDE: Pro Brain Natriuretic Peptide: 203 pg/mL (ref <300.0)

## 2024-07-31 MED ORDER — ONDANSETRON HCL 4 MG/2ML IJ SOLN
4.0000 mg | Freq: Once | INTRAMUSCULAR | Status: DC
  Filled 2024-07-31: qty 2

## 2024-07-31 MED ORDER — IOHEXOL 350 MG/ML SOLN
75.0000 mL | Freq: Once | INTRAVENOUS | Status: AC | PRN
  Administered 2024-07-31: 75 mL via INTRAVENOUS

## 2024-07-31 MED ORDER — FAMOTIDINE 20 MG PO TABS: 20.0000 mg | ORAL_TABLET | Freq: Two times a day (BID) | ORAL | 0 refills | Status: AC

## 2024-07-31 MED ORDER — PANTOPRAZOLE SODIUM 40 MG IV SOLR
40.0000 mg | Freq: Once | INTRAVENOUS | Status: AC
  Administered 2024-07-31: 40 mg via INTRAVENOUS
  Filled 2024-07-31: qty 10

## 2024-07-31 NOTE — ED Notes (Signed)
 Pt/family received d/c paperwork at this time. After going over the paperwork any questions, comments, or concerns were answered to the best of this nurse's knowledge. The pt/family verbally acknowledged the teachings/instructions.

## 2024-07-31 NOTE — ED Triage Notes (Signed)
 Pt arrived via POV complaining of burning in the left side of his chest intermittently. Pt stated it makes him dizzy and he feels like he's going to throw up. Symptoms started yesterday and got worse today.

## 2024-07-31 NOTE — Discharge Instructions (Addendum)
 Thank you for coming to [facility]. You were seen for intermittent burning chest pain and dizziness/nausea. We did an exam, labs, and imaging, and these showed no acute findings. You can take pepcid 20 mg twice per day to see if this helps your symptoms. Please follow up with your cardiologist Dr. Debera within 1 week.   Do not hesitate to return to the ED or call 911 if you experience: -Worsening symptoms -Nausea/vomiting so severe that you cannot eat, drink, or take your medications -Lightheadedness, passing out -Fevers/chills -Anything else that concerns you

## 2024-07-31 NOTE — ED Notes (Signed)
 Ambulated pt around the unit; O2 maintained 95-96%, Heart rate ranging from 59-65.

## 2024-07-31 NOTE — ED Provider Notes (Signed)
 [facility] AT [facility] Provider Note   CSN: [id number] Arrival date & time: 07/31/24  1740     History  Chief Complaint  Patient presents with   Dizziness   Chest Pain    Shawn Lopez is a 83 y.o. male with T2DM, A-fib, CAD, HLD, HTN, tobacco abuse, history of H. pylori infection, GERD, who presents with burning in the left side of his chest intermittently. Pt stated it makes him dizzy and he feels like he's going to throw up. Symptoms started yesterday and got worse today. No h/o similar. Didn't feel like prior heart attacks, which included left arm pain, and he didn't have that this time. No SOB, cough. Didn't feel like heartburn.    Past Medical History:  Diagnosis Date   Arthritis    Atrial fibrillation (HCC) 2006   a. in 2006; not on anticoagulation, no recent atrial fib seen as of 2018.   Borderline hypertension    CAD (coronary artery disease)    a. NSTEMI 12/2016: cath showing 95% mid-LAD stenosis, 75% prox-LAD stenosis and 75% 1st Diag stenosis. DES to the proximal and mid-LAD along with the 1st Diagonal..   Chronic back pain    Prior trauma with vertebral fracture   Hyperlipidemia    Hypertension    Osteopenia    Sinus bradycardia    Type 2 diabetes mellitus (HCC)        Home Medications Prior to Admission medications   Medication Sig Start Date End Date Taking? Authorizing Provider  aspirin  EC 81 MG tablet Take 1 tablet (81 mg total) by mouth daily with breakfast. 05/11/23   Pearlean Manus, MD  atorvastatin  (LIPITOR ) 80 MG tablet Take 1 tablet (80 mg total) by mouth every evening. 06/30/24   Tobie Suzzane POUR, MD  clopidogrel  (PLAVIX ) 75 MG tablet TAKE 1 TABLET BY MOUTH EACH DAY FOR BLOOD THINNER 08/06/23   Tobie Suzzane POUR, MD  esomeprazole  (NEXIUM ) 20 MG capsule Take 1 capsule (20 mg total) by mouth 2 (two) times daily before a meal. Patient taking differently: Take 20 mg by mouth as needed (acid reflux). 11/16/23   Tobie Suzzane POUR, MD  glimepiride  (AMARYL ) 2 MG tablet Take 1 tablet (2 mg total) by mouth every morning. Patient taking differently: Take 1 mg by mouth 2 (two) times daily. 11/16/23   Tobie Suzzane POUR, MD  lisinopril  (ZESTRIL ) 10 MG tablet TAKE 1 TABLET(10 MG) BY MOUTH DAILY 02/20/24   Tobie Suzzane POUR, MD  meclizine  (ANTIVERT ) 12.5 MG tablet Take 1 tablet (12.5 mg total) by mouth 3 (three) times daily as needed for dizziness. 12/03/23   Garrick Charleston, MD  nitroGLYCERIN  (NITROSTAT ) 0.4 MG SL tablet Place 1 tablet (0.4 mg total) under the tongue every 5 (five) minutes as needed for chest pain. For 3 doses only 12/04/23   Debera Jayson MATSU, MD      Allergies    Amlodipine , Brilinta  [ticagrelor ], and Other    Review of Systems   Review of Systems A 10 point review of systems was performed and is negative unless otherwise reported in HPI.  Physical Exam Updated Vital Signs BP (!) 169/74 (BP Location: Left Arm)   Pulse (!) 57   Temp 98.6 F (37 C) (Oral)   Resp 12   Ht 5' 10 (1.778 m)   Wt 84 kg   SpO2 96%   BMI 26.57 kg/m  Physical Exam General: Normal appearing male, lying in bed.  HEENT: PERRLA, EOMI, no  nystagmus sclera anicteric, MMM, trachea midline.  Cardiology: RRR, no murmurs/rubs/gallops. BL radial and DP pulses equal bilaterally.  Resp: Normal respiratory rate and effort. CTAB, no wheezes, rhonchi, crackles.  Abd: Soft, non-tender, non-distended. No rebound tenderness or guarding.  GU: Deferred. MSK: No peripheral edema or signs of trauma. Extremities without deformity or TTP. No cyanosis or clubbing. Skin: warm, dry. Neuro: A&Ox4, CNs II-XII grossly intact.  5 out of 5 strength in all extremities. Sensation grossly intact.  Normal speech.  Normal gait. Psych: Normal mood and affect.   ED Results / Procedures / Treatments   Labs (all labs ordered are listed, but only abnormal results are displayed) Labs Reviewed  COMPREHENSIVE METABOLIC PANEL WITH GFR - Abnormal; Notable for  the following components:      Result Value   Glucose, Bld 164 (*)    All other components within normal limits  CBC  LIPASE, BLOOD  PRO BRAIN NATRIURETIC PEPTIDE  TROPONIN T, HIGH SENSITIVITY  TROPONIN T, HIGH SENSITIVITY    EKG None  Radiology CT PE: 1. No evidence of pulmonary embolism. 2. Mild bilateral upper lobe and bilateral lower lobe atelectasis. 3. Mild lingular scarring and/or atelectasis. 4. Areas of mild patchy ground-glass appearing lung parenchyma within the bilateral upper lobes, which may represent mild pulmonary edema. 5. Evidence of prior cholecystectomy. 6. Colonic diverticulosis. 7. Aortic atherosclerosis.  CXR: No active disease.   Procedures Procedures    Medications Ordered in ED Medications  pantoprazole  (PROTONIX ) injection 40 mg (40 mg Intravenous Given 07/31/24 2007)  iohexol  (OMNIPAQUE ) 350 MG/ML injection 75 mL (75 mLs Intravenous Contrast Given 07/31/24 2036)    ED Course/ Medical Decision Making/ A&P                          Medical Decision Making Amount and/or Complexity of Data Reviewed Labs: ordered. Decision-making details documented in ED Course. Radiology: ordered. Decision-making details documented in ED Course.  Risk Prescription drug management.    This patient presents to the ED for concern of chest pain and dizziness, currently asymptomatic ; this involves an extensive number of treatment options, and is a complaint that carries with it a high risk of complications and morbidity.  I considered the following differential and admission for this acute, potentially life threatening condition.  Patient is mildly bradycardic but otherwise extremely well-appearing.  MDM:    DDX for chest pain includes but is not limited to:  Consider ACS or arrhythmia.  EKG (performed but did not crossover into chart ) without any signs of ischemia or arrhythmia at this time, monitored on telemetry and his heart rate is in the mid 40s to  high 50s, patient states that this normal and chronic for him.  Otherwise no arrhythmia.  He has no chest pain during his ED visit and remains asymptomatic during that time.  Troponin is negative x 2, overall lower concern for ACS.  No hypoxia, tachypnea, tachycardia, shortness of breath that would indicate PE but cannot PERC out because of age.  Imaging does not demonstrate any signs of PE, aortic dissection, PTX, PNA, cardiac tamponade or pleural effusion.  The angio does show areas of mild patchy GGO appearing parenchyma in the bilateral upper lobes that may represent mild pulmonary edema.  Patient does not demonstrate any cough, orthopnea, crackles or rales on exam.  He is ambulated in the department and maintains an SpO2 sat of greater than 95% and he has a negative proBNP and  no leg swelling.  Low concern for acute heart failure.  He described the pain as burning but stated it felt different than heartburn.  I will recommend the patient take 20 mg of Pepcid twice daily to see if this helps his pain.  Also associated with dizziness but patient does not have any dizziness here in the department and his vitals are stable.  No vertigo sensation or nystagmus on exam.  No focal neurodeficits to indicate stroke.  Patient is stable for follow-up with cardiologist within 1 to 2 weeks.  Clinical Course as of 08/03/24 1728  Thu Jul 31, 2024  1952 Troponin T High Sensitivity: <15 [HN]  1952 CBC, CMP wnl [HN]  2114 CT Angio Chest PE W and/or Wo Contrast 1. No evidence of pulmonary embolism. 2. Mild bilateral upper lobe and bilateral lower lobe atelectasis. 3. Mild lingular scarring and/or atelectasis. 4. Areas of mild patchy ground-glass appearing lung parenchyma within the bilateral upper lobes, which may represent mild pulmonary edema. 5. Evidence of prior cholecystectomy. 6. Colonic diverticulosis. 7. Aortic atherosclerosis.   [HN]  2201 Pulse Rate(!): 45 Patient states his HR is normally in high 40s;  [HN]  2220 Ambulated very well, SpO2 >95% [HN]  2241 Pro Brain Natriuretic Peptide: 203.0 neg [HN]    Clinical Course User Index [HN] Franklyn Sid SAILOR, MD    Labs: I Ordered, and personally interpreted labs.  The pertinent results include: Those listed above  Imaging Studies ordered: I ordered imaging studies including CT PE I independently visualized and interpreted imaging. I agree with the radiologist interpretation  Additional history obtained from chart review.    Cardiac Monitoring: The patient was maintained on a cardiac monitor.  I personally viewed and interpreted the cardiac monitored which showed an underlying rhythm of: Sinus bradycardia  Reevaluation: After the interventions noted above, I reevaluated the patient and found that they have :resolved  Social Determinants of Health:  lives independently  Disposition:  DC w/ discharge instructions/return precautions. All questions answered to patient's satisfaction.    Co morbidities that complicate the patient evaluation  Past Medical History:  Diagnosis Date   Arthritis    Atrial fibrillation (HCC) 2006   a. in 2006; not on anticoagulation, no recent atrial fib seen as of 2018.   Borderline hypertension    CAD (coronary artery disease)    a. NSTEMI 12/2016: cath showing 95% mid-LAD stenosis, 75% prox-LAD stenosis and 75% 1st Diag stenosis. DES to the proximal and mid-LAD along with the 1st Diagonal..   Chronic back pain    Prior trauma with vertebral fracture   Hyperlipidemia    Hypertension    Osteopenia    Sinus bradycardia    Type 2 diabetes mellitus (HCC)      Medicines No orders of the defined types were placed in this encounter.   I have reviewed the patients home medicines and have made adjustments as needed  Problem List / ED Course: Problem List Items Addressed This Visit   None Visit Diagnoses       Atypical chest pain    -  Primary     Dizziness                       This  note was created using dictation software, which may contain spelling or grammatical errors.    Franklyn Sid SAILOR, MD 08/03/24 [phone]

## 2024-08-01 ENCOUNTER — Other Ambulatory Visit: Payer: Self-pay | Admitting: Internal Medicine

## 2024-08-01 DIAGNOSIS — I10 Essential (primary) hypertension: Secondary | ICD-10-CM

## 2024-08-02 ENCOUNTER — Other Ambulatory Visit: Payer: Self-pay | Admitting: Internal Medicine

## 2024-09-07 NOTE — Progress Notes (Unsigned)
 Cardiology Office Note    Date:  09/09/2024  ID:  Shawn Lopez, Shawn Lopez [birth year], MRN [id number] Cardiologist: Jayson Sierras, MD Cardiology APP:  Johnson Laymon [name], PA-C { :  History of Present Illness:    Shawn Lopez is a 83 y.o. male with past medical history of CAD (s/p stenting to LAD and D1 in 12/2016 with patent stents by cath in 03/2019, low-risk NST's in 01/2021 and 11/2022), palpitations, HTN, HLD and Type II DM who presents to the office today for follow-up.    He was last examined by Dr. Sierras in 11/2023 and reported having episodes of dizziness along with nausea and vague sensations in his chest which had led to recent ED evaluation. MRI Brain had recently been obtained and showed no acute intracranial abnormalities. Was provided with a prescription for Meclizine  in the ED. A repeat 7-day Zio patch was recommended for further assessment. Otherwise, was continued on his current cardiac medications with ASA 81 mg daily, Atorvastatin  80 mg daily, Plavix  75 mg daily and Lisinopril  10 mg daily. His Zio patch showed predominantly normal sinus rhythm with an average heart rate of 51 bpm. He did have rare PAC's and PVC's representing less than 1% of total beats. He did have 2 episodes of NSVT with the longest lasting for 19 beats and episodes of PSVT with the longest lasting 14 seconds. He was not started on medical therapy for this given his baseline bradycardia.  In the interim, he was evaluated at [facility] ED on 07/31/2024 for evaluation of burning along the left side of his chest and intermittent dizziness.  Troponin values and proBNP were normal. CTA showed no evidence of a PE but was noted to have mild bilateral atelectasis and possible mild pulmonary edema. His chest pain was overall felt to be atypical for a cardiac etiology and he was discharged home.  In talking with the patient today, he reports taking his Lisinopril  a few times per week as this makes him dizzy  and he will frequently only take half a pill at times. He denies any recurrent chest pain since ED evaluation last month. He still works as a surveyor, minerals and denies any chest pain or dyspnea on exertion when working on job sites. No specific palpitations, orthopnea, PND or pitting edema.  Studies Reviewed:   EKG: EKG is not ordered today.  Echocardiogram: 04/2023 IMPRESSIONS     1. Left ventricular ejection fraction, by estimation, is 60 to 65%. The  left ventricle has normal function. The left ventricle has no regional  wall motion abnormalities. Left ventricular diastolic parameters are  consistent with Grade I diastolic  dysfunction (impaired relaxation).   2. Right ventricular systolic function is normal. The right ventricular  size is normal. There is normal pulmonary artery systolic pressure.   3. The mitral valve is normal in structure. Trivial mitral valve  regurgitation. No evidence of mitral stenosis.   4. The tricuspid valve is abnormal.   5. The aortic valve is tricuspid. There is mild calcification of the  aortic valve. There is mild thickening of the aortic valve. Aortic valve  regurgitation is not visualized. No aortic stenosis is present.   6. The inferior vena cava is normal in size with greater than 50%  respiratory variability, suggesting right atrial pressure of 3 mmHg.     Event Monitor: 11/2023 ZIO monitor reviewed.  8 days, 10 hours analyzed.   Predominant rhythm is sinus with heart rate ranging from 40 bpm up  to 107 bpm and average heart rate 51 bpm. There were rare PACs including atrial couplets and triplets representing less than 1% total beats. There were rare PVCs including ventricular couplets and triplets representing less than 1% total beats.  Also limited episodes of ventricular bigeminy and trigeminy. Two episodes of NSVT were noted, the longest of which was 19 beats. There were several (14) episodes of PSVT, the longest of which lasted approximately  14 seconds. No pauses or high degree heart block.   Physical Exam:   VS:  BP 128/60 (BP Location: Right Arm, Cuff Size: Normal)   Pulse (!) 52   Ht 5' 10 (1.778 m)   Wt 186 lb 9.6 oz (84.6 kg)   SpO2 96%   BMI 26.77 kg/m    Wt Readings from Last 3 Encounters:  09/09/24 186 lb 9.6 oz (84.6 kg)  07/31/24 185 lb 3 oz (84 kg)  05/23/24 185 lb 3.2 oz (84 kg)     GEN: Pleasant male appearing  in no acute distress NECK: No JVD; No carotid bruits CARDIAC: RRR, no murmurs, rubs, gallops RESPIRATORY:  Clear to auscultation without rales, wheezing or rhonchi  ABDOMEN: Appears non-distended. No obvious abdominal masses. EXTREMITIES: No clubbing or cyanosis. No pitting edema.  Distal pedal pulses are 2+ bilaterally.   Assessment and Plan:   1. Coronary artery disease involving native coronary artery of native heart without angina pectoris - He previously underwent stenting to the LAD and D1 in 2018 and did have patent stents by cardiac catheterization in 03/2019. NST in 11/2022 was low-risk. - He did have an episode of chest pain in 07/2024 and ED workup was reassuring at that time and he reports pain resolved that day. He remains very active at baseline for his age without any recurrent symptoms. Given no recurrence of symptoms, will continue with medical therapy for now. Continue ASA 81 mg daily, Plavix  75 mg daily, Atorvastatin  80 mg daily and Lisinopril  (with dose reduction as outlined below).   2. Palpitations - Prior monitor in 11/2023 showed 2 episodes of NSVT with the longest lasting for 19 beats and episodes of SVT with the longest lasting for 14 seconds along with rare PAC's and PVC's with less than 1% burden.  - He denies any recent palpitations. Given his average heart rate in the 50's, he has not been on AV nodal blocking agents.  3. Essential hypertension - Blood pressure is at 128/60 during today's visit. As discussed above, he takes variable doses of Lisinopril  due to  dizziness with the medication  I recommended that he reduce his dose to 5 mg daily. If still having dizziness, encouraged him to make us  aware as this may need to be reduced to 2.5 mg daily.  4. Hyperlipidemia LDL goal <70 - FLP in 04/2024 showed total cholesterol 115, triglycerides 107, HDL 44 and LDL 51. AST 26 and ALT 26. Continue current medical therapy with Atorvastatin  80 mg daily.  Signed, Laymon [name] Qua, PA-C

## 2024-09-09 ENCOUNTER — Encounter: Payer: Self-pay | Admitting: Student

## 2024-09-09 ENCOUNTER — Ambulatory Visit: Attending: Student | Admitting: Student

## 2024-09-09 VITALS — BP 128/60 | HR 52 | Ht 70.0 in | Wt 186.6 lb

## 2024-09-09 DIAGNOSIS — I10 Essential (primary) hypertension: Secondary | ICD-10-CM

## 2024-09-09 DIAGNOSIS — I2510 Atherosclerotic heart disease of native coronary artery without angina pectoris: Secondary | ICD-10-CM

## 2024-09-09 DIAGNOSIS — R002 Palpitations: Secondary | ICD-10-CM

## 2024-09-09 DIAGNOSIS — E785 Hyperlipidemia, unspecified: Secondary | ICD-10-CM

## 2024-09-09 MED ORDER — LISINOPRIL 5 MG PO TABS: 5.0000 mg | ORAL_TABLET | Freq: Every day | ORAL | 3 refills | Status: AC

## 2024-09-09 NOTE — Patient Instructions (Signed)
 Medication Instructions:  Your physician has recommended you make the following change in your medication:   -Decrease Lisinopril  to 5 mg once daily   *If you need a refill on your cardiac medications before your next appointment, please call your pharmacy*  Lab Work: None If you have labs (blood work) drawn today and your tests are completely normal, you will receive your results only by: MyChart Message (if you have MyChart) OR A paper copy in the mail If you have any lab test that is abnormal or we need to change your treatment, we will call you to review the results.  Testing/Procedures: None  Follow-Up: At [facility], you and your health needs are our priority.  As part of our continuing mission to provide you with exceptional heart care, our providers are all part of one team.  This team includes your primary Cardiologist (physician) and Advanced Practice Providers or APPs (Physician Assistants and Nurse Practitioners) who all work together to provide you with the care you need, when you need it.  Your next appointment:   6 month(s)  Provider:   You may see Jayson Sierras, MD or one of the following Advanced Practice Providers on your designated Care Team:   Laymon Qua, PA-C  [place], [place] Olivia [place], [place]     We recommend signing up for the patient portal called MyChart.  Sign up information is provided on this After Visit Summary.  MyChart is used to connect with patients for Virtual Visits (Telemedicine).  Patients are able to view lab/test results, encounter notes, upcoming appointments, etc.  Non-urgent messages can be sent to your provider as well.   To learn more about what you can do with MyChart, go to [email or web address].   Other Instructions

## 2024-10-06 ENCOUNTER — Ambulatory Visit: Payer: Medicare HMO

## 2024-10-06 VITALS — Ht 70.0 in | Wt 186.0 lb

## 2024-10-06 DIAGNOSIS — Z0000 Encounter for general adult medical examination without abnormal findings: Secondary | ICD-10-CM

## 2024-10-06 DIAGNOSIS — Z1211 Encounter for screening for malignant neoplasm of colon: Secondary | ICD-10-CM

## 2024-10-06 NOTE — Patient Instructions (Signed)
 Mr. Sonn,  Thank you for taking the time for your Medicare Wellness Visit. I appreciate your continued commitment to your health goals. Please review the care plan we discussed, and feel free to reach out if I can assist you further.  Please note that Annual Wellness Visits do not include a physical exam. Some assessments may be limited, especially if the visit was conducted virtually. If needed, we may recommend an in-person follow-up with your provider.  Ongoing Care Seeing your primary care provider every 3 to 6 months helps us  monitor your health and provide consistent, personalized care.   1 year follow up for Medicare well visit: October 07, 2025 at 8:40 am with medicare wellness nurse in office  Referrals If a referral was made during today's visit and you haven't received any updates within two weeks, please contact the referred provider directly to check on the status.  [facility] at  [address] Phone: [phone]  Recommended Screenings:  Health Maintenance  Topic Date Due   DTaP/Tdap/Td vaccine (1 - Tdap) Never done   Zoster (Shingles) Vaccine (1 of 2) Never done   Colon Cancer Screening  04/19/2024   Flu Shot  04/25/2024   COVID-19 Vaccine (1 - 2025-26 season) Never done   Complete foot exam   07/12/2024   Medicare Annual Wellness Visit  10/01/2024   Hemoglobin A1C  11/22/2024   Eye exam for diabetics  01/16/2025   Yearly kidney health urinalysis for diabetes  05/23/2025   Yearly kidney function blood test for diabetes  07/31/2025   Pneumococcal Vaccine for age over 57  Completed   Meningitis B Vaccine  Aged Out       10/06/2024   10:54 AM  Advanced Directives  Does Patient Have a Medical Advance Directive? No  Would patient like information on creating a medical advance directive? Yes (MAU/Ambulatory/Procedural Areas - Information given)    Vision: Annual vision screenings are recommended for  early detection of glaucoma, cataracts, and diabetic retinopathy. These exams can also reveal signs of chronic conditions such as diabetes and high blood pressure.  Dental: Annual dental screenings help detect early signs of oral cancer, gum disease, and other conditions linked to overall health, including heart disease and diabetes.  Please see the attached documents for additional preventive care recommendations.

## 2024-10-06 NOTE — Progress Notes (Signed)
 "  HM Addressed: Referral sent to GI for colonoscopy Chief Complaint  Patient presents with   Medicare Wellness     Subjective:   Shawn Lopez is a 84 y.o. male who presents for a Medicare Annual Wellness Visit.  Visit info / Clinical Intake: Medicare Wellness Visit Type:: Subsequent Annual Wellness Visit Persons participating in visit and providing information:: patient Medicare Wellness Visit Mode:: Telephone If telephone:: video error Since this visit was completed virtually, some vitals may be partially provided or unavailable. Missing vitals are due to the limitations of the virtual format.: Documented vitals are patient reported If Telephone or Video please confirm:: I connected with patient using audio/video enable telemedicine. I verified patient identity with two identifiers, discussed telehealth limitations, and patient agreed to proceed. Patient Location:: home Provider Location:: office Interpreter Needed?: No Pre-visit prep was completed: yes AWV questionnaire completed by patient prior to visit?: no Living arrangements:: lives with spouse/significant other Patient's Overall Health Status Rating: very good Typical amount of pain: none Does pain affect daily life?: no Are you currently prescribed opioids?: no  Dietary Habits and Nutritional Risks How many meals a day?: 3 Eats fruit and vegetables daily?: yes Most meals are obtained by: having others provide food In the last 2 weeks, have you had any of the following?: none Diabetic:: (!) yes Any non-healing wounds?: no How often do you check your BS?: 4 Would you like to be referred to a Nutritionist or for Diabetic Management? : no  Functional Status Activities of Daily Living (to include ambulation/medication): Independent Ambulation: Independent Medication Administration: Independent Home Management (perform basic housework or laundry): Independent Manage your own finances?: yes Primary transportation  is: driving Concerns about vision?: no *vision screening is required for WTM* Concerns about hearing?: no  Fall Screening Falls in the past year?: 0 Number of falls in past year: 0 Was there an injury with Fall?: 0 Fall Risk Category Calculator: 0 Patient Fall Risk Level: Low Fall Risk  Fall Risk Patient at Risk for Falls Due to: No Fall Risks Fall risk Follow up: Falls evaluation completed; Education provided; Falls prevention discussed  Home and Transportation Safety: All rugs have non-skid backing?: N/A, no rugs All stairs or steps have railings?: N/A, no stairs Grab bars in the bathtub or shower?: (!) no Have non-skid surface in bathtub or shower?: yes Good home lighting?: yes Regular seat belt use?: yes Hospital stays in the last year:: no  Cognitive Assessment Difficulty concentrating, remembering, or making decisions? : no Will 6CIT or Mini Cog be Completed: yes What year is it?: 0 points What month is it?: 0 points Give patient an address phrase to remember (5 components): [address] [place] About what time is it?: 0 points Count backwards from 20 to 1: 0 points Say the months of the year in reverse: 0 points Repeat the address phrase from earlier: 0 points 6 CIT Score: 0 points  Advance Directives (For Healthcare) Does Patient Have a Medical Advance Directive?: No Would patient like information on creating a medical advance directive?: Yes (MAU/Ambulatory/Procedural Areas - Information given)  Reviewed/Updated  Reviewed/Updated: Reviewed All (Medical, Surgical, Family, Medications, Allergies, Care Teams, Patient Goals)    Allergies (verified) Amlodipine , Brilinta  [ticagrelor ], and Other   Current Medications (verified) Outpatient Encounter Medications as of 10/06/2024  Medication Sig   aspirin  EC 81 MG tablet Take 1 tablet (81 mg total) by mouth daily with breakfast.   atorvastatin  (LIPITOR ) 80 MG tablet Take 1 tablet (80 mg  total) by mouth every  evening.   clopidogrel  (PLAVIX ) 75 MG tablet TAKE 1 TABLET BY MOUTH EACH DAY FOR BLOOD THINNER   esomeprazole  (NEXIUM ) 20 MG capsule Take 1 capsule (20 mg total) by mouth 2 (two) times daily before a meal. (Patient taking differently: Take 20 mg by mouth as needed (acid reflux).)   famotidine  (PEPCID ) 20 MG tablet Take 1 tablet (20 mg total) by mouth 2 (two) times daily.   glimepiride  (AMARYL ) 2 MG tablet Take 1 tablet (2 mg total) by mouth every morning. (Patient taking differently: Take 1 mg by mouth as needed (high blood sugar).)   lisinopril  (ZESTRIL ) 5 MG tablet Take 1 tablet (5 mg total) by mouth daily.   nitroGLYCERIN  (NITROSTAT ) 0.4 MG SL tablet Place 1 tablet (0.4 mg total) under the tongue every 5 (five) minutes as needed for chest pain. For 3 doses only   No facility-administered encounter medications on file as of 10/06/2024.    History: Past Medical History:  Diagnosis Date   Arthritis    Atrial fibrillation (HCC) 2006   a. in 2006; not on anticoagulation, no recent atrial fib seen as of 2018.   Borderline hypertension    CAD (coronary artery disease)    a. NSTEMI 12/2016: cath showing 95% mid-LAD stenosis, 75% prox-LAD stenosis and 75% 1st Diag stenosis. DES to the proximal and mid-LAD along with the 1st Diagonal..   Chronic back pain    Prior trauma with vertebral fracture   Hyperlipidemia    Hypertension    Osteopenia    Sinus bradycardia    Type 2 diabetes mellitus [facility])    Past Surgical History:  Procedure Laterality Date   APPENDECTOMY  1960   BIOPSY  04/20/2023   Procedure: BIOPSY;  Surgeon: Cinderella Deatrice FALCON, MD;  Location: [facility];  Service: Endoscopy;;   CATARACT EXTRACTION  2012   right eye,lens implantation   CHOLECYSTECTOMY     CIRCUMCISION  2010   meatotomy and dilatation   COLONOSCOPY N/A 01/14/2013   Procedure: COLONOSCOPY;  Surgeon: Oneil DELENA Budge, MD;  Location: [facility];  Service: Gastroenterology;  Laterality: N/A;   COLONOSCOPY WITH  PROPOFOL  N/A 04/20/2023   Procedure: COLONOSCOPY WITH PROPOFOL ;  Surgeon: Cinderella Deatrice FALCON, MD;  Location: [facility];  Service: Endoscopy;  Laterality: N/A;  7:30am;asa 3   CORONARY ANGIOGRAPHY N/A 01/15/2017   Procedure: Coronary Angiography;  Surgeon: Dorn JINNY Lesches, MD;  Location: [facility] INVASIVE CV LAB;  Service: Cardiovascular;  Laterality: N/A;   CORONARY PRESSURE/FFR STUDY N/A 04/04/2019   Procedure: INTRAVASCULAR PRESSURE WIRE/FFR STUDY;  Surgeon: Mady Bruckner, MD;  Location: [facility] INVASIVE CV LAB;  Service: Cardiovascular;  Laterality: N/A;   CORONARY STENT INTERVENTION N/A 01/15/2017   Procedure: Coronary Stent Intervention;  Surgeon: Dorn JINNY Lesches, MD;  Location: [facility] INVASIVE CV LAB;  Service: Cardiovascular;  Laterality: N/A;   ESOPHAGOGASTRODUODENOSCOPY (EGD) WITH PROPOFOL  N/A 04/20/2023   Procedure: ESOPHAGOGASTRODUODENOSCOPY (EGD) WITH PROPOFOL ;  Surgeon: Cinderella Deatrice FALCON, MD;  [facility];  Service: Endoscopy;  Laterality: N/A;  7:30am;asa 3   LEFT HEART CATH AND CORONARY ANGIOGRAPHY N/A 04/04/2019   Procedure: LEFT HEART CATH AND CORONARY ANGIOGRAPHY;  Surgeon: Mady Bruckner, MD;  Location: [facility] INVASIVE CV LAB;  Service: Cardiovascular;  Laterality: N/A;   POLYPECTOMY  04/20/2023   Procedure: POLYPECTOMY;  Surgeon: Cinderella Deatrice FALCON, MD;  [facility];  Service: Endoscopy;;   Family History  Problem Relation Age of Onset   CAD Father  Heart attack Brother    Social History   Occupational History   Occupation: Holiday Representative    Comment: Ecologist  Tobacco Use   Smoking status: Former    Current packs/day: 0.00    Average packs/day: 1.5 packs/day for 15.0 years (22.5 ttl pk-yrs)    Types: Cigarettes    Start date: 04/20/1982    Quit date: 04/20/1997    Years since quitting: 27.4    Passive exposure: Past   Smokeless tobacco: Never  Vaping Use   Vaping status: Never Used  Substance and Sexual Activity   Alcohol use: No    Drug use: No   Sexual activity: Not on file   Tobacco Counseling Counseling given: Yes  SDOH Screenings   Food Insecurity: No Food Insecurity (10/06/2024)  Housing: Low Risk (10/06/2024)  Transportation Needs: No Transportation Needs (10/06/2024)  Utilities: Not At Risk (10/06/2024)  Alcohol Screen: Low Risk (10/02/2023)  Depression (PHQ2-9): Low Risk (10/06/2024)  Financial Resource Strain: Low Risk (10/02/2023)  Physical Activity: Sufficiently Active (10/06/2024)  Social Connections: Socially Integrated (10/06/2024)  Stress: No Stress Concern Present (10/06/2024)  Tobacco Use: Medium Risk (10/06/2024)  Health Literacy: Adequate Health Literacy (10/06/2024)   See flowsheets for full screening details  Depression Screen PHQ 2 & 9 Depression Scale- Over the past 2 weeks, how often have you been bothered by any of the following problems? Little interest or pleasure in doing things: 0 Feeling down, depressed, or hopeless (PHQ Adolescent also includes...irritable): 0 PHQ-2 Total Score: 0 Trouble falling or staying asleep, or sleeping too much: 0 Feeling tired or having little energy: 0 Poor appetite or overeating (PHQ Adolescent also includes...weight loss): 0 Feeling bad about yourself - or that you are a failure or have let yourself or your family down: 0 Trouble concentrating on things, such as reading the newspaper or watching television (PHQ Adolescent also includes...like school work): 0 Moving or speaking so slowly that other people could have noticed. Or the opposite - being so fidgety or restless that you have been moving around a lot more than usual: 0 Thoughts that you would be better off dead, or of hurting yourself in some way: 0 PHQ-9 Total Score: 0 If you checked off any problems, how difficult have these problems made it for you to do your work, take care of things at home, or get along with other people?: Not difficult at all  Depression Treatment Depression  Interventions/Treatment : [id number] Score <4 Follow-up Not Indicated     Goals Addressed             This Visit's Progress    Patient Stated   On track    Stay healthy and active             Objective:    Today's Vitals   10/06/24 1050  Weight: 186 lb (84.4 kg)  Height: 5' 10 (1.778 m)   Body mass index is 26.69 kg/m.  Hearing/Vision screen Hearing Screening - Comments:: Patient denies any hearing difficulties.   Vision Screening - Comments:: Patient wears reading glasses only. Up to date with yearly exams.  Thresa Vicci Dixon Immunizations and Health Maintenance Health Maintenance  Topic Date Due   DTaP/Tdap/Td (1 - Tdap) Never done   Zoster Vaccines- Shingrix (1 of 2) Never done   Colonoscopy  04/19/2024   Influenza Vaccine  04/25/2024   COVID-19 Vaccine (1 - 2025-26 season) Never done   FOOT EXAM  07/12/2024   HEMOGLOBIN A1C  11/22/2024  OPHTHALMOLOGY EXAM  01/16/2025   Diabetic kidney evaluation - Urine ACR  05/23/2025   Diabetic kidney evaluation - eGFR measurement  07/31/2025   Medicare Annual Wellness (AWV)  10/06/2025   Pneumococcal Vaccine: 50+ Years  Completed   Meningococcal B Vaccine  Aged Out        Assessment/Plan:  This is a routine wellness examination for Elsie.  Patient Care Team: Tobie Suzzane POUR, MD as PCP - General (Internal Medicine) Mariette Mitzie CROME, NP as Nurse Practitioner (Gastroenterology) Johnson Laymon [name] RIGGERS as Physician Assistant (Cardiology) Vicci Mcardle, [place] (Optometry) Debera Jayson MATSU, MD as Consulting Physician (Cardiology)  I have personally reviewed and noted the following in the patients chart:   Medical and social history Use of alcohol, tobacco or illicit drugs  Current medications and supplements including opioid prescriptions. Functional ability and status Nutritional status Physical activity Advanced directives List of other physicians Hospitalizations, surgeries, and ER visits in previous  12 months Vitals Screenings to include cognitive, depression, and falls Referrals and appointments  Orders Placed This Encounter  Procedures   Ambulatory referral to Gastroenterology    Referral Priority:   Routine    Referral Type:   Consultation    Referral Reason:   Specialty Services Required    Number of Visits Requested:   1   In addition, I have reviewed and discussed with patient certain preventive protocols, quality metrics, and best practice recommendations. A written personalized care plan for preventive services as well as general preventive health recommendations were provided to patient.   Kaylin Marcon, CMA   10/06/2024   Return October 07, 2025 at 8:40 am.  After Visit Summary: (Mail) Due to this being a telephonic visit, the after visit summary with patients personalized plan was offered to patient via mail    "

## 2024-10-15 ENCOUNTER — Encounter (INDEPENDENT_AMBULATORY_CARE_PROVIDER_SITE_OTHER): Payer: Self-pay | Admitting: *Deleted

## 2024-11-21 ENCOUNTER — Ambulatory Visit: Admitting: Internal Medicine

## 2025-10-07 ENCOUNTER — Ambulatory Visit: Payer: Self-pay
